# Patient Record
Sex: Female | Born: 1965 | Race: White | Hispanic: No | Marital: Single | Smoking: Current every day smoker
Health system: Southern US, Community
[De-identification: ages and names within clinical notes are randomized; demographics above are authoritative.]

## PROBLEM LIST (undated history)

## (undated) DIAGNOSIS — F329 Major depressive disorder, single episode, unspecified: Secondary | ICD-10-CM

## (undated) DIAGNOSIS — M1990 Unspecified osteoarthritis, unspecified site: Secondary | ICD-10-CM

## (undated) DIAGNOSIS — G629 Polyneuropathy, unspecified: Secondary | ICD-10-CM

## (undated) DIAGNOSIS — E7800 Pure hypercholesterolemia, unspecified: Secondary | ICD-10-CM

## (undated) DIAGNOSIS — J449 Chronic obstructive pulmonary disease, unspecified: Secondary | ICD-10-CM

## (undated) DIAGNOSIS — D631 Anemia in chronic kidney disease: Secondary | ICD-10-CM

## (undated) DIAGNOSIS — R51 Headache: Secondary | ICD-10-CM

## (undated) DIAGNOSIS — J42 Unspecified chronic bronchitis: Secondary | ICD-10-CM

## (undated) DIAGNOSIS — E039 Hypothyroidism, unspecified: Secondary | ICD-10-CM

## (undated) DIAGNOSIS — L719 Rosacea, unspecified: Secondary | ICD-10-CM

## (undated) DIAGNOSIS — C01 Malignant neoplasm of base of tongue: Secondary | ICD-10-CM

## (undated) DIAGNOSIS — F32A Depression, unspecified: Secondary | ICD-10-CM

## (undated) DIAGNOSIS — R011 Cardiac murmur, unspecified: Secondary | ICD-10-CM

## (undated) DIAGNOSIS — R519 Headache, unspecified: Secondary | ICD-10-CM

## (undated) DIAGNOSIS — D649 Anemia, unspecified: Secondary | ICD-10-CM

## (undated) DIAGNOSIS — K219 Gastro-esophageal reflux disease without esophagitis: Secondary | ICD-10-CM

## (undated) DIAGNOSIS — M797 Fibromyalgia: Secondary | ICD-10-CM

## (undated) DIAGNOSIS — R569 Unspecified convulsions: Secondary | ICD-10-CM

## (undated) DIAGNOSIS — N183 Chronic kidney disease, stage 3 (moderate): Principal | ICD-10-CM

## (undated) DIAGNOSIS — J189 Pneumonia, unspecified organism: Secondary | ICD-10-CM

## (undated) DIAGNOSIS — F419 Anxiety disorder, unspecified: Secondary | ICD-10-CM

## (undated) DIAGNOSIS — Z973 Presence of spectacles and contact lenses: Secondary | ICD-10-CM

## (undated) DIAGNOSIS — J45909 Unspecified asthma, uncomplicated: Secondary | ICD-10-CM

## (undated) HISTORY — DX: Chronic kidney disease, stage 3 (moderate): N18.3

## (undated) HISTORY — DX: Malignant neoplasm of base of tongue: C01

## (undated) HISTORY — DX: Anemia in chronic kidney disease: D63.1

## (undated) HISTORY — PX: TONSILLECTOMY: SUR1361

## (undated) HISTORY — PX: PORTACATH PLACEMENT: SHX2246

## (undated) HISTORY — PX: WISDOM TOOTH EXTRACTION: SHX21

## (undated) HISTORY — PX: GASTROSTOMY TUBE PLACEMENT: SHX655

---

## 2014-06-26 DIAGNOSIS — C01 Malignant neoplasm of base of tongue: Secondary | ICD-10-CM

## 2014-06-26 HISTORY — DX: Malignant neoplasm of base of tongue: C01

## 2014-08-08 ENCOUNTER — Emergency Department (HOSPITAL_COMMUNITY)
Admission: EM | Admit: 2014-08-08 | Discharge: 2014-08-08 | Disposition: A | Discharge status: Home / Self Care | Payer: 59 | Attending: Emergency Medicine | Admitting: Emergency Medicine

## 2014-08-08 ENCOUNTER — Encounter (HOSPITAL_COMMUNITY): Payer: Self-pay

## 2014-08-08 ENCOUNTER — Emergency Department (HOSPITAL_COMMUNITY): Payer: 59

## 2014-08-08 DIAGNOSIS — R042 Hemoptysis: Secondary | ICD-10-CM | POA: Diagnosis present

## 2014-08-08 DIAGNOSIS — Z720 Tobacco use: Secondary | ICD-10-CM | POA: Diagnosis not present

## 2014-08-08 DIAGNOSIS — Z880 Allergy status to penicillin: Secondary | ICD-10-CM | POA: Insufficient documentation

## 2014-08-08 DIAGNOSIS — Z91040 Latex allergy status: Secondary | ICD-10-CM | POA: Insufficient documentation

## 2014-08-08 DIAGNOSIS — Z7951 Long term (current) use of inhaled steroids: Secondary | ICD-10-CM | POA: Insufficient documentation

## 2014-08-08 DIAGNOSIS — C801 Malignant (primary) neoplasm, unspecified: Secondary | ICD-10-CM

## 2014-08-08 DIAGNOSIS — C969 Malignant neoplasm of lymphoid, hematopoietic and related tissue, unspecified: Secondary | ICD-10-CM | POA: Insufficient documentation

## 2014-08-08 DIAGNOSIS — G40909 Epilepsy, unspecified, not intractable, without status epilepticus: Secondary | ICD-10-CM | POA: Insufficient documentation

## 2014-08-08 DIAGNOSIS — R000 Tachycardia, unspecified: Secondary | ICD-10-CM | POA: Insufficient documentation

## 2014-08-08 DIAGNOSIS — Z79899 Other long term (current) drug therapy: Secondary | ICD-10-CM | POA: Diagnosis not present

## 2014-08-08 DIAGNOSIS — R51 Headache: Secondary | ICD-10-CM | POA: Insufficient documentation

## 2014-08-08 DIAGNOSIS — J029 Acute pharyngitis, unspecified: Secondary | ICD-10-CM | POA: Insufficient documentation

## 2014-08-08 HISTORY — DX: Unspecified convulsions: R56.9

## 2014-08-08 LAB — CBC WITH DIFFERENTIAL/PLATELET
BASOS ABS: 0 10*3/uL (ref 0.0–0.1)
BASOS PCT: 0 % (ref 0–1)
EOS ABS: 0.3 10*3/uL (ref 0.0–0.7)
Eosinophils Relative: 3 % (ref 0–5)
HCT: 31.1 % — ABNORMAL LOW (ref 36.0–46.0)
Hemoglobin: 10.1 g/dL — ABNORMAL LOW (ref 12.0–15.0)
LYMPHS ABS: 1.6 10*3/uL (ref 0.7–4.0)
Lymphocytes Relative: 19 % (ref 12–46)
MCH: 32.8 pg (ref 26.0–34.0)
MCHC: 32.5 g/dL (ref 30.0–36.0)
MCV: 101 fL — AB (ref 78.0–100.0)
MONOS PCT: 8 % (ref 3–12)
Monocytes Absolute: 0.7 10*3/uL (ref 0.1–1.0)
NEUTROS ABS: 6.1 10*3/uL (ref 1.7–7.7)
Neutrophils Relative %: 70 % (ref 43–77)
Platelets: 243 10*3/uL (ref 150–400)
RBC: 3.08 MIL/uL — AB (ref 3.87–5.11)
RDW: 15.3 % (ref 11.5–15.5)
WBC: 8.8 10*3/uL (ref 4.0–10.5)

## 2014-08-08 LAB — BASIC METABOLIC PANEL
ANION GAP: 6 (ref 5–15)
BUN: 23 mg/dL (ref 6–23)
CALCIUM: 9.6 mg/dL (ref 8.4–10.5)
CO2: 27 mmol/L (ref 19–32)
Chloride: 105 mmol/L (ref 96–112)
Creatinine, Ser: 0.57 mg/dL (ref 0.50–1.10)
GLUCOSE: 81 mg/dL (ref 70–99)
Potassium: 4.4 mmol/L (ref 3.5–5.1)
SODIUM: 138 mmol/L (ref 135–145)

## 2014-08-08 MED ORDER — LIDOCAINE VISCOUS 2 % MT SOLN: 20.0000 mL | OROMUCOSAL | Status: DC | PRN

## 2014-08-08 MED ORDER — IOHEXOL 300 MG/ML  SOLN
75.0000 mL | Freq: Once | INTRAMUSCULAR | Status: AC | PRN
  Administered 2014-08-08: 75 mL via INTRAVENOUS

## 2014-08-08 MED ORDER — HYDROCODONE-ACETAMINOPHEN 5-325 MG PO TABS: 1.0000 | ORAL_TABLET | Freq: Four times a day (QID) | ORAL | Status: DC | PRN

## 2014-08-08 MED ORDER — SODIUM CHLORIDE 0.9 % IJ SOLN
INTRAMUSCULAR | Status: AC
  Filled 2014-08-08: qty 45

## 2014-08-08 MED ORDER — SODIUM CHLORIDE 0.9 % IJ SOLN
INTRAMUSCULAR | Status: AC
  Filled 2014-08-08: qty 600

## 2014-08-08 NOTE — ED Notes (Signed)
Pt states she has been spitting up bright blood for over a month. States she has been to her PCP and was told she needed a CT but they never scheduled it.

## 2014-08-08 NOTE — ED Notes (Signed)
MD at bedside. 

## 2014-08-08 NOTE — ED Provider Notes (Signed)
CSN: [id number]     Arrival date & time 08/08/14  0847 History   First MD Initiated Contact with Patient 08/08/14 [phone]     Chief Complaint  Patient presents with  . Hemoptysis     (Consider location/radiation/quality/duration/timing/severity/associated sxs/prior Treatment) The history is provided by the patient.   Shelby Larson is a 49 y.o. female who presents to the ED with "spitting up blood". She reports that the symptoms started just after Christmas. She has been to her doctor several times. She has been treated with 2 round of antibiotics and steroids. She had a CXR in the office and the doctor told her it did not show anything but that she needed a CT of her chest. Patient states that she has a knot on the left side of her neck that started as a pea size node and has progressed. Now she is having change in her voice and pain to the side of her neck, face and head. She states she does not have difficulty swallowing but she can tell a difference when she swallows. She tried to call her doctor several times yesterday request that the CT scan be done but did not hear back from the office. The patient's sister, who is an Therapist, sports,  came into town today and brought the patient to the ED.   Past Medical History  Diagnosis Date  . Seizures    History reviewed. No pertinent past surgical history. No family history on file. History  Substance Use Topics  . Smoking status: Current Every Day Smoker  . Smokeless tobacco: Not on file  . Alcohol Use: No   OB History    No data available     Review of Systems  HENT: Positive for sore throat and voice change. Negative for drooling and trouble swallowing.   Respiratory: Positive for cough.        Hemoptysis   Neurological: Positive for headaches.  Hematological: Positive for adenopathy.  all other systems negative    Allergies  Penicillins and Latex  Home Medications   Prior to Admission medications   Medication Sig Start Date End  Date Taking? Authorizing Provider  Acetaminophen (CHLORASEPTIC SORE THROAT PO) Take 2 sprays by mouth daily as needed (sore throat).   Yes Historical Provider, MD  cyclobenzaprine (FLEXERIL) 10 MG tablet Take 10 mg by mouth 3 (three) times daily.   Yes Historical Provider, MD  fluticasone (FLONASE) 50 MCG/ACT nasal spray Place 2 sprays into both nostrils daily.   Yes Historical Provider, MD  gabapentin (NEURONTIN) 600 MG tablet Take 600 mg by mouth 3 (three) times daily.   Yes Historical Provider, MD  MAGNESIUM PO Take 2 tablets by mouth daily.   Yes Historical Provider, MD  Multiple Vitamin (MULTIVITAMIN WITH MINERALS) TABS tablet Take 1 tablet by mouth daily.   Yes Historical Provider, MD  naproxen sodium (ANAPROX) 220 MG tablet Take 440 mg by mouth daily as needed (pain).   Yes Historical Provider, MD  QUEtiapine (SEROQUEL) 50 MG tablet Take 50 mg by mouth daily as needed (sleep).   Yes Historical Provider, MD  tetrahydrozoline-zinc (VISINE-AC) 0.05-0.25 % ophthalmic solution Place 2 drops into both eyes daily as needed (dry eyes).   Yes Historical Provider, MD  HYDROcodone-acetaminophen (NORCO/VICODIN) 5-325 MG per tablet Take 1 tablet by mouth every 6 (six) hours as needed for severe pain. 08/08/14   Carmin Muskrat, MD  lidocaine (XYLOCAINE) 2 % solution Use as directed 20 mLs in the mouth or throat as  needed for mouth pain. 08/08/14   Carmin Muskrat, MD   BP 114/75 mmHg  Pulse 115  Temp(Src) 97.8 F (36.6 C) (Oral)  Resp 18  Ht 5\' 6"  (1.676 m)  Wt 121 lb (54.885 kg)  BMI 19.54 kg/m2  SpO2 100% Physical Exam  Constitutional: She is oriented to person, place, and time. She appears well-developed and well-nourished.  HENT:  Head: Normocephalic.  Mouth/Throat: Uvula is midline and mucous membranes are normal. Posterior oropharyngeal erythema present.  Eyes: Conjunctivae and EOM are normal.  Neck: Neck supple.  Enlarged lymph node left side of neck. Smaller node right side.    Cardiovascular: Tachycardia present.   Pulmonary/Chest: Effort normal. She has no wheezes. She has no rales.  Musculoskeletal: Normal range of motion.  Lymphadenopathy:    She has cervical adenopathy.  Neurological: She is alert and oriented to person, place, and time. No cranial nerve deficit.  Skin: Skin is warm and dry.  Psychiatric: She has a normal mood and affect. Her behavior is normal.  Nursing note and vitals reviewed.   ED Course  Procedures  MDM  49 y.o. female with swollen lymph node, change in voice and pain to the left side of her neck and head that has been progressing since December 2015. Stable for d/c to follow up with ENT. Dr. Vanita Panda in to discuss in detail results of the CT scan and plan of care with the patient and her family. They voice understanding and agree with plan. Patient will return as need for any problems.   Results for orders placed or performed during the hospital encounter of 08/08/14 (from the past 24 hour(s))  Basic metabolic panel     Status: None   Collection Time: 08/08/14  9:59 AM  Result Value Ref Range   Sodium 138 135 - 145 mmol/L   Potassium 4.4 3.5 - 5.1 mmol/L   Chloride 105 96 - 112 mmol/L   CO2 27 19 - 32 mmol/L   Glucose, Bld 81 70 - 99 mg/dL   BUN 23 6 - 23 mg/dL   Creatinine, Ser 0.57 0.50 - 1.10 mg/dL   Calcium 9.6 8.4 - 10.5 mg/dL   GFR calc non Af Amer >90 >90 mL/min   GFR calc Af Amer >90 >90 mL/min   Anion gap 6 5 - 15  CBC with Differential     Status: Abnormal   Collection Time: 08/08/14  9:59 AM  Result Value Ref Range   WBC 8.8 4.0 - 10.5 K/uL   RBC 3.08 (L) 3.87 - 5.11 MIL/uL   Hemoglobin 10.1 (L) 12.0 - 15.0 g/dL   HCT 31.1 (L) 36.0 - 46.0 %   MCV 101.0 (H) 78.0 - 100.0 fL   MCH 32.8 26.0 - 34.0 pg   MCHC 32.5 30.0 - 36.0 g/dL   RDW 15.3 11.5 - 15.5 %   Platelets 243 150 - 400 K/uL   Neutrophils Relative % 70 43 - 77 %   Neutro Abs 6.1 1.7 - 7.7 K/uL   Lymphocytes Relative 19 12 - 46 %   Lymphs Abs 1.6  0.7 - 4.0 K/uL   Monocytes Relative 8 3 - 12 %   Monocytes Absolute 0.7 0.1 - 1.0 K/uL   Eosinophils Relative 3 0 - 5 %   Eosinophils Absolute 0.3 0.0 - 0.7 K/uL   Basophils Relative 0 0 - 1 %   Basophils Absolute 0.0 0.0 - 0.1 K/uL    Ct Soft Tissue Neck W Contrast  08/08/2014   CLINICAL DATA:  Hemoptysis.  EXAM: CT NECK WITH CONTRAST  CHEST CT WITH CONTRAST  TECHNIQUE: Multidetector CT imaging of the neck and chest was performed using the standard protocol following the bolus administration of intravenous contrast.  CONTRAST:  34mL OMNIPAQUE IOHEXOL 300 MG/ML  SOLN  COMPARISON:  None.  FINDINGS: Pharynx and larynx: 3.2 cm mass in the left oropharynx, centered to the left with broad contact with the left posterior tongue and glossotonsillar sulcus. Probable early extension into the pre epiglottic fat. The margin with the lingual epiglottis is indistinct, but this could be displacement rather than invasion. No glottis or infraglottic tumor. No evidence of cord paralysis.  Adenopathy in the left cervical chain, levels IIA/B, III, and IV. The largest left node is in the jugulodigastric measuring 23 mm in maximal diameter. The right jugulodigastric node is mildly enlarged but appears heterogeneous in enhancement. There is mild but asymmetric enlargement of the lateral left retropharyngeal lymph node, without necrosis.  Salivary glands: Negative  Thyroid: Negative  Vascular: Cervical carotid atherosclerosis without hemodynamically significant stenosis.  Limited intracranial: Negative  Mastoids and visualized paranasal sinuses: Negative  Skeleton: No evidence of metastasis  THORACIC INLET/BODY WALL:  No acute abnormality.  MEDIASTINUM:  Normal heart size. No pericardial effusion. No acute vascular abnormality. No adenopathy.  LUNG WINDOWS:  Suspect mild emphysema. No consolidation. No effusion. No suspicious pulmonary nodule.  UPPER ABDOMEN:  No evidence of metastatic disease  OSSEOUS:  No acute fracture.  No  suspicious lytic or blastic lesions.  IMPRESSION: 1. 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 The mass causes moderate to advanced airway narrowing. For staging, the tumor involves the tongue base, left glossotonsillar sulcus, lingual epiglottis and likely preepiglottic fat. 2. Bilateral cervical adenopathy, levels noted above. A lateral left retropharyngeal lymph node is also suspicious due to asymmetry. 3. No evidence of thoracic metastatic disease.   Electronically Signed   By: Monte Fantasia M.D.   On: 08/08/2014 12:42   Ct Chest W Contrast  08/08/2014   CLINICAL DATA:  Hemoptysis.  EXAM: CT NECK WITH CONTRAST  CHEST CT WITH CONTRAST  TECHNIQUE: Multidetector CT imaging of the neck and chest was performed using the standard protocol following the bolus administration of intravenous contrast.  CONTRAST:  7mL OMNIPAQUE IOHEXOL 300 MG/ML  SOLN  COMPARISON:  None.  FINDINGS: Pharynx and larynx: 3.2 cm mass in the left oropharynx, centered to the left with broad contact with the left posterior tongue and glossotonsillar sulcus. Probable early extension into the pre epiglottic fat. The margin with the lingual epiglottis is indistinct, but this could be displacement rather than invasion. No glottis or infraglottic tumor. No evidence of cord paralysis.  Adenopathy in the left cervical chain, levels IIA/B, III, and IV. The largest left node is in the jugulodigastric measuring 23 mm in maximal diameter. The right jugulodigastric node is mildly enlarged but appears heterogeneous in enhancement. There is mild but asymmetric enlargement of the lateral left retropharyngeal lymph node, without necrosis.  Salivary glands: Negative  Thyroid: Negative  Vascular: Cervical carotid atherosclerosis without hemodynamically significant stenosis.  Limited intracranial: Negative  Mastoids and visualized paranasal sinuses: Negative  Skeleton: No evidence of metastasis  THORACIC INLET/BODY WALL:  No acute  abnormality.  MEDIASTINUM:  Normal heart size. No pericardial effusion. No acute vascular abnormality. No adenopathy.  LUNG WINDOWS:  Suspect mild emphysema. No consolidation. No effusion. No suspicious pulmonary nodule.  UPPER ABDOMEN:  No evidence of metastatic disease  OSSEOUS:  No acute fracture.  No suspicious lytic or blastic lesions.  IMPRESSION: 1. 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 The mass causes moderate to advanced airway narrowing. For staging, the tumor involves the tongue base, left glossotonsillar sulcus, lingual epiglottis and likely preepiglottic fat. 2. Bilateral cervical adenopathy, levels noted above. A lateral left retropharyngeal lymph node is also suspicious due to asymmetry. 3. No evidence of thoracic metastatic disease.   Electronically Signed   By: Monte Fantasia M.D.   On: 08/08/2014 12:42    Final diagnoses:  Adenocarcinoma       Ashley Murrain, NP 08/08/14 [place], MD 08/08/14 [phone]

## 2014-08-08 NOTE — Discharge Instructions (Signed)
Your CT scan of your neck today shows a mass that appears to be cancer. You will need to call Dr. Janace Hoard' office on Monday and tell them that we spoke with him and he want you to come to the office on Monday. If you have problems before then such as difficulty swallowing return here.

## 2014-08-08 NOTE — ED Notes (Addendum)
IV removed per pt. Request, states it was painful. No swelling at site, flushes easily.

## 2014-08-11 ENCOUNTER — Other Ambulatory Visit: Payer: Self-pay | Admitting: Otolaryngology

## 2014-08-11 ENCOUNTER — Other Ambulatory Visit (HOSPITAL_COMMUNITY)
Admission: RE | Admit: 2014-08-11 | Discharge: 2014-08-11 | Disposition: A | Discharge status: Home / Self Care | Payer: 59 | Source: Ambulatory Visit | Attending: Otolaryngology | Admitting: Otolaryngology

## 2014-08-11 DIAGNOSIS — D490 Neoplasm of unspecified behavior of digestive system: Secondary | ICD-10-CM | POA: Diagnosis not present

## 2014-08-17 ENCOUNTER — Other Ambulatory Visit (HOSPITAL_COMMUNITY): Payer: Self-pay | Admitting: Otolaryngology

## 2014-08-18 NOTE — H&P (Addendum)
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 CT neck showed large left pharyngeal mass and multiple left neck necrotic nodes. 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 Tonsillar biopsy showed patchy p16 positivity.  She  now presents for awake tracheotomy and tonsillectomy/resection left pharyngeal mass with left neck dissection.  Dr. Simeon Craft, [place] has discussed the risks (need for awake tracheotomy, bleeding, infection, airway/anesthesia risks, death, anoxic brain injury, injury to lingual, hypoglossal, facial, spinal accessory, vagus, or phrenic nerves, wound breakdown), benefits, and alternatives of this procedure. The patient understands the risks and would like to proceed with the procedure. The chances of success of the procedure are >50% and the patient understands this. I personally performed an examination of the patient within 24 hours of the procedure.  PAST MEDICAL HISTORY: Past Medical History  Diagnosis Date  . Seizures     PAST SURGICAL HISTORY: No past surgical history on file.  MEDICATIONS: No current facility-administered medications on file prior to encounter.   Current Outpatient Prescriptions on File Prior to Encounter  Medication Sig Dispense Refill  . Acetaminophen (CHLORASEPTIC SORE THROAT PO) Take 2 sprays by mouth daily as needed (sore throat).    . cyclobenzaprine (FLEXERIL) 10 MG tablet Take 10 mg by mouth 3 (three) times daily.    . fluticasone (FLONASE) 50 MCG/ACT nasal spray Place 2 sprays into both nostrils daily.    Marland Kitchen gabapentin (NEURONTIN) 600 MG tablet Take 600 mg by mouth 3 (three) times daily.    Marland Kitchen HYDROcodone-acetaminophen (NORCO/VICODIN) 5-325 MG per tablet Take 1 tablet by mouth every 6 (six) hours as needed  for severe pain. 15 tablet 0  . lidocaine (XYLOCAINE) 2 % solution Use as directed 20 mLs in the mouth or throat as needed for mouth pain. 100 mL 0  . MAGNESIUM PO Take 2 tablets by mouth daily.    . Multiple Vitamin (MULTIVITAMIN WITH MINERALS) TABS tablet Take 1 tablet by mouth daily.    . naproxen sodium (ANAPROX) 220 MG tablet Take 440 mg by mouth daily as needed (pain).    . QUEtiapine (SEROQUEL) 50 MG tablet Take 50 mg by mouth daily as needed (sleep).    Marland Kitchen tetrahydrozoline-zinc (VISINE-AC) 0.05-0.25 % ophthalmic solution Place 2 drops into both eyes daily as needed (dry eyes).      ALLERGIES: Allergies  Allergen Reactions  . Penicillins Other (See Comments)    unknown  . Latex Swelling and Rash      SOCIAL HISTORY:  History   Social History  . Marital Status: Single    Spouse Name: N/A  . Number of Children: N/A  . Years of Education: N/A   Occupational History  . Not on file.   Social History Main Topics  . Smoking status: Current Every Day Smoker  . Smokeless tobacco: Not on file  . Alcohol Use: No  . Drug Use: No  . Sexual Activity: Not on file   Other Topics Concern  . Not on file   Social History Narrative  . No narrative on file    FAMILY HISTORY: No family history on file.  REVIEW OF SYSTEMS:  HEENT: hemoptysis/dysphagia/sore throat, left neck masses, otherwise negative x 12 systems except per HPI  PHYSICAL EXAM:  GENERAL: NAD   VITAL SIGNS:  There  were no vitals filed for this visit. SKIN:  Warm, dry HEENT:   Tonsils grossly normal on oral exam, flexible laryngoscopy performed in pre-op NECK/LYMPH:  Multiple left neck level II-IV lymph nodes ABDOMEN:  soft MUSCULOSKELETAL: normal strength PSYCH:  Normal affect NEUROLOGIC:  CN 2-12 intact and symmetric  DIAGNOSTIC STUDIES: CT neck with large left pharyngeal mass and multiple large left neck nodes, scattered small right neck nodes, CT chest with no obvious lung metastases, likely stage [id number] vs  N2cM0  31575: flexible laryngoscopy-after explaining the risks (bleeding/infection), the 13mm flexible scope was passed through the right and left nasal cavity. The septum is deviated to the left. The right nasal cavity shows some dry mucosa and crusting. The tongue base shows a large, ~ 3 cm left pharyngeal/tongue base mass with smooth walls consistent with the pharyngeal mass seen on CT scan. This abuts the left lingual surface of the epiglottis. The true vocal folds are visualized and are pushed to the patient's right but are mobile with no obvious masses and intact adduction and abduction bilaterally.   ASSESSMENT AND PLAN: flexible laryngoscopy in pre-op demonstrates large left pharyngeal mass. Safest plan would be awake tracheotomy followed by debulking or excision if possible of the left pharyngeal mass/tonsillectomy and then left neck dissection. Discussed the awake tracheotomy with the patient and she agrees with the plan. Patient understands the risks, benefits, and alternatives. Informed written consent signed, witnessed, and on chart. 08/20/14  8:06 AM Shelby Larson

## 2014-08-19 ENCOUNTER — Encounter (HOSPITAL_COMMUNITY): Payer: Self-pay | Admitting: *Deleted

## 2014-08-20 ENCOUNTER — Inpatient Hospital Stay (HOSPITAL_COMMUNITY): Payer: 59

## 2014-08-20 ENCOUNTER — Inpatient Hospital Stay (HOSPITAL_COMMUNITY)
Admission: RE | Admit: 2014-08-20 | Discharge: 2014-08-28 | DRG: 013 | Disposition: A | Discharge status: Skilled Nursing Facility | Payer: 59 | Source: Ambulatory Visit | Attending: Otolaryngology | Admitting: Otolaryngology

## 2014-08-20 ENCOUNTER — Inpatient Hospital Stay (HOSPITAL_COMMUNITY): Payer: 59 | Admitting: Certified Registered Nurse Anesthetist

## 2014-08-20 ENCOUNTER — Encounter (HOSPITAL_COMMUNITY): Payer: Self-pay | Admitting: *Deleted

## 2014-08-20 ENCOUNTER — Encounter (HOSPITAL_COMMUNITY)
Admission: RE | Disposition: A | Discharge status: Skilled Nursing Facility | Payer: Self-pay | Source: Ambulatory Visit | Attending: Otolaryngology

## 2014-08-20 DIAGNOSIS — C01 Malignant neoplasm of base of tongue: Secondary | ICD-10-CM | POA: Diagnosis present

## 2014-08-20 DIAGNOSIS — J988 Other specified respiratory disorders: Secondary | ICD-10-CM | POA: Diagnosis present

## 2014-08-20 DIAGNOSIS — C109 Malignant neoplasm of oropharynx, unspecified: Secondary | ICD-10-CM

## 2014-08-20 DIAGNOSIS — Z930 Tracheostomy status: Secondary | ICD-10-CM

## 2014-08-20 DIAGNOSIS — R221 Localized swelling, mass and lump, neck: Secondary | ICD-10-CM | POA: Diagnosis present

## 2014-08-20 DIAGNOSIS — Z430 Encounter for attention to tracheostomy: Secondary | ICD-10-CM

## 2014-08-20 DIAGNOSIS — F17210 Nicotine dependence, cigarettes, uncomplicated: Secondary | ICD-10-CM | POA: Diagnosis present

## 2014-08-20 DIAGNOSIS — R1310 Dysphagia, unspecified: Secondary | ICD-10-CM

## 2014-08-20 DIAGNOSIS — Z0189 Encounter for other specified special examinations: Secondary | ICD-10-CM

## 2014-08-20 DIAGNOSIS — C140 Malignant neoplasm of pharynx, unspecified: Principal | ICD-10-CM | POA: Diagnosis present

## 2014-08-20 DIAGNOSIS — Z4659 Encounter for fitting and adjustment of other gastrointestinal appliance and device: Secondary | ICD-10-CM

## 2014-08-20 DIAGNOSIS — R1313 Dysphagia, pharyngeal phase: Secondary | ICD-10-CM | POA: Diagnosis not present

## 2014-08-20 HISTORY — DX: Cardiac murmur, unspecified: R01.1

## 2014-08-20 HISTORY — DX: Depression, unspecified: F32.A

## 2014-08-20 HISTORY — DX: Major depressive disorder, single episode, unspecified: F32.9

## 2014-08-20 HISTORY — DX: Anxiety disorder, unspecified: F41.9

## 2014-08-20 HISTORY — DX: Fibromyalgia: M79.7

## 2014-08-20 HISTORY — PX: RADICAL NECK DISSECTION: SHX2284

## 2014-08-20 HISTORY — DX: Unspecified osteoarthritis, unspecified site: M19.90

## 2014-08-20 HISTORY — DX: Polyneuropathy, unspecified: G62.9

## 2014-08-20 HISTORY — DX: Anemia, unspecified: D64.9

## 2014-08-20 HISTORY — PX: TONSILLECTOMY: SHX5217

## 2014-08-20 HISTORY — DX: Unspecified chronic bronchitis: J42

## 2014-08-20 HISTORY — DX: Headache: R51

## 2014-08-20 HISTORY — DX: Rosacea, unspecified: L71.9

## 2014-08-20 HISTORY — PX: TRACHEOSTOMY TUBE PLACEMENT: SHX814

## 2014-08-20 HISTORY — DX: Headache, unspecified: R51.9

## 2014-08-20 LAB — COMPREHENSIVE METABOLIC PANEL
ALT: 14 U/L (ref 0–35)
AST: 31 U/L (ref 0–37)
Albumin: 3.4 g/dL — ABNORMAL LOW (ref 3.5–5.2)
Alkaline Phosphatase: 76 U/L (ref 39–117)
Anion gap: 17 — ABNORMAL HIGH (ref 5–15)
BUN: 9 mg/dL (ref 6–23)
CALCIUM: 8.5 mg/dL (ref 8.4–10.5)
CO2: 19 mmol/L (ref 19–32)
Chloride: 101 mmol/L (ref 96–112)
Creatinine, Ser: 0.84 mg/dL (ref 0.50–1.10)
GFR calc Af Amer: >90 mL/min (ref >90)
GFR, EST NON AFRICAN AMERICAN: 81 mL/min — AB (ref >90)
Glucose, Bld: 72 mg/dL (ref 70–99)
Potassium: 4.3 mmol/L (ref 3.5–5.1)
SODIUM: 137 mmol/L (ref 135–145)
TOTAL PROTEIN: 6.3 g/dL (ref 6.0–8.3)
Total Bilirubin: 1.8 mg/dL — ABNORMAL HIGH (ref 0.3–1.2)

## 2014-08-20 LAB — ABO/RH: ABO/RH(D): A POS

## 2014-08-20 LAB — BASIC METABOLIC PANEL
Anion gap: 12 (ref 5–15)
BUN: 10 mg/dL (ref 6–23)
CO2: 25 mmol/L (ref 19–32)
Calcium: 9.5 mg/dL (ref 8.4–10.5)
Chloride: 98 mmol/L (ref 96–112)
Creatinine, Ser: 0.86 mg/dL (ref 0.50–1.10)
GFR, EST NON AFRICAN AMERICAN: 79 mL/min — AB (ref >90)
Glucose, Bld: 90 mg/dL (ref 70–99)
POTASSIUM: 3.1 mmol/L — AB (ref 3.5–5.1)
Sodium: 135 mmol/L (ref 135–145)

## 2014-08-20 LAB — CBC
HCT: 26 % — ABNORMAL LOW (ref 36.0–46.0)
HCT: 26.8 % — ABNORMAL LOW (ref 36.0–46.0)
Hemoglobin: 8.2 g/dL — ABNORMAL LOW (ref 12.0–15.0)
Hemoglobin: 8.8 g/dL — ABNORMAL LOW (ref 12.0–15.0)
MCH: 30.9 pg (ref 26.0–34.0)
MCH: 30.7 pg (ref 26.0–34.0)
MCHC: 31.5 g/dL (ref 30.0–36.0)
MCHC: 32.8 g/dL (ref 30.0–36.0)
MCV: 98.1 fL (ref 78.0–100.0)
MCV: 93.4 fL (ref 78.0–100.0)
Platelets: 358 10*3/uL (ref 150–400)
Platelets: 228 10*3/uL (ref 150–400)
RBC: 2.65 MIL/uL — ABNORMAL LOW (ref 3.87–5.11)
RBC: 2.87 MIL/uL — ABNORMAL LOW (ref 3.87–5.11)
RDW: 15.4 % (ref 11.5–15.5)
RDW: 15.7 % — AB (ref 11.5–15.5)
WBC: 11.9 10*3/uL — ABNORMAL HIGH (ref 4.0–10.5)
WBC: 8.1 10*3/uL (ref 4.0–10.5)

## 2014-08-20 LAB — PREPARE RBC (CROSSMATCH)

## 2014-08-20 LAB — PHOSPHORUS: Phosphorus: 4.3 mg/dL (ref 2.3–4.6)

## 2014-08-20 LAB — MAGNESIUM: Magnesium: 1.3 mg/dL — ABNORMAL LOW (ref 1.5–2.5)

## 2014-08-20 LAB — APTT: APTT: 28 s (ref 24–37)

## 2014-08-20 LAB — PROTIME-INR
INR: 1.11 (ref 0.00–1.49)
Prothrombin Time: 14.4 seconds (ref 11.6–15.2)

## 2014-08-20 SURGERY — TONSILLECTOMY
Anesthesia: General | Site: Throat

## 2014-08-20 MED ORDER — FENTANYL CITRATE 0.05 MG/ML IJ SOLN
INTRAMUSCULAR | Status: AC
  Filled 2014-08-20: qty 5

## 2014-08-20 MED ORDER — LIDOCAINE-EPINEPHRINE 1 %-1:100000 IJ SOLN
INTRAMUSCULAR | Status: AC
  Filled 2014-08-20: qty 1

## 2014-08-20 MED ORDER — QUETIAPINE FUMARATE 50 MG PO TABS
50.0000 mg | ORAL_TABLET | Freq: Every day | ORAL | Status: DC | PRN
  Administered 2014-08-20 – 2014-08-24 (×2): 50 mg via ORAL
  Filled 2014-08-20 (×4): qty 1

## 2014-08-20 MED ORDER — PROPOFOL 10 MG/ML IV BOLUS
INTRAVENOUS | Status: AC
  Filled 2014-08-20: qty 20

## 2014-08-20 MED ORDER — NICOTINE 14 MG/24HR TD PT24
14.0000 mg | MEDICATED_PATCH | Freq: Every day | TRANSDERMAL | Status: DC | PRN
  Filled 2014-08-20: qty 1

## 2014-08-20 MED ORDER — PNEUMOCOCCAL VAC POLYVALENT 25 MCG/0.5ML IJ INJ
0.5000 mL | INJECTION | INTRAMUSCULAR | Status: DC
  Filled 2014-08-20: qty 0.5

## 2014-08-20 MED ORDER — CLINDAMYCIN PHOSPHATE 600 MG/50ML IV SOLN
600.0000 mg | Freq: Once | INTRAVENOUS | Status: AC
  Administered 2014-08-20: 600 mg via INTRAVENOUS
  Filled 2014-08-20: qty 50

## 2014-08-20 MED ORDER — SODIUM CHLORIDE 0.9 % IV SOLN: Freq: Once | INTRAVENOUS | Status: DC

## 2014-08-20 MED ORDER — HYDROMORPHONE HCL 1 MG/ML IJ SOLN
0.2500 mg | INTRAMUSCULAR | Status: DC | PRN
  Administered 2014-08-20: 0.25 mg via INTRAVENOUS
  Administered 2014-08-20: 0.75 mg via INTRAVENOUS

## 2014-08-20 MED ORDER — CYCLOBENZAPRINE HCL 10 MG PO TABS
10.0000 mg | ORAL_TABLET | Freq: Three times a day (TID) | ORAL | Status: DC
  Administered 2014-08-20 – 2014-08-28 (×22): 10 mg via ORAL
  Filled 2014-08-20 (×24): qty 1

## 2014-08-20 MED ORDER — LIDOCAINE-EPINEPHRINE 1 %-1:100000 IJ SOLN
INTRAMUSCULAR | Status: DC | PRN
  Administered 2014-08-20: 40 mL

## 2014-08-20 MED ORDER — DIPHENHYDRAMINE HCL 50 MG/ML IJ SOLN: 12.5000 mg | Freq: Four times a day (QID) | INTRAMUSCULAR | Status: DC | PRN

## 2014-08-20 MED ORDER — DOCUSATE SODIUM 50 MG/5ML PO LIQD
100.0000 mg | Freq: Two times a day (BID) | ORAL | Status: DC | PRN
  Filled 2014-08-20: qty 10

## 2014-08-20 MED ORDER — MIDAZOLAM HCL 5 MG/5ML IJ SOLN
INTRAMUSCULAR | Status: DC | PRN
  Administered 2014-08-20 (×2): 1 mg via INTRAVENOUS

## 2014-08-20 MED ORDER — SERTRALINE HCL 100 MG PO TABS
100.0000 mg | ORAL_TABLET | Freq: Every day | ORAL | Status: DC
  Administered 2014-08-21 – 2014-08-28 (×8): 100 mg via ORAL
  Filled 2014-08-20 (×8): qty 1

## 2014-08-20 MED ORDER — MAGNESIUM GLUCONATE 500 MG PO TABS
500.0000 mg | ORAL_TABLET | Freq: Every day | ORAL | Status: DC
  Administered 2014-08-20 – 2014-08-28 (×9): 500 mg via ORAL
  Filled 2014-08-20 (×10): qty 1

## 2014-08-20 MED ORDER — MIDAZOLAM HCL 2 MG/2ML IJ SOLN
INTRAMUSCULAR | Status: AC
  Filled 2014-08-20: qty 2

## 2014-08-20 MED ORDER — BACITRACIN ZINC 500 UNIT/GM EX OINT
TOPICAL_OINTMENT | CUTANEOUS | Status: AC
  Filled 2014-08-20: qty 28.35

## 2014-08-20 MED ORDER — PROPOFOL 10 MG/ML IV BOLUS
INTRAVENOUS | Status: DC | PRN
  Administered 2014-08-20: 10 mg via INTRAVENOUS
  Administered 2014-08-20: 100 mg via INTRAVENOUS
  Administered 2014-08-20 (×3): 10 mg via INTRAVENOUS
  Administered 2014-08-20: 20 mg via INTRAVENOUS
  Administered 2014-08-20: 40 mg via INTRAVENOUS

## 2014-08-20 MED ORDER — HEMOSTATIC AGENTS (NO CHARGE) OPTIME
TOPICAL | Status: DC | PRN
  Administered 2014-08-20: 1 via TOPICAL

## 2014-08-20 MED ORDER — PHENYLEPHRINE HCL 10 MG/ML IJ SOLN
INTRAMUSCULAR | Status: DC | PRN
  Administered 2014-08-20 (×3): 80 ug via INTRAVENOUS

## 2014-08-20 MED ORDER — DIPHENHYDRAMINE HCL 12.5 MG/5ML PO ELIX
12.5000 mg | ORAL_SOLUTION | Freq: Four times a day (QID) | ORAL | Status: DC | PRN
  Filled 2014-08-20: qty 10

## 2014-08-20 MED ORDER — HYDROMORPHONE HCL 1 MG/ML IJ SOLN
INTRAMUSCULAR | Status: AC
  Administered 2014-08-20: 0.25 mg via INTRAVENOUS
  Filled 2014-08-20: qty 1

## 2014-08-20 MED ORDER — FENTANYL CITRATE 0.05 MG/ML IJ SOLN
INTRAMUSCULAR | Status: DC | PRN
  Administered 2014-08-20 (×2): 50 ug via INTRAVENOUS
  Administered 2014-08-20: 25 ug via INTRAVENOUS
  Administered 2014-08-20 (×2): 50 ug via INTRAVENOUS
  Administered 2014-08-20: 25 ug via INTRAVENOUS

## 2014-08-20 MED ORDER — PANTOPRAZOLE SODIUM 40 MG IV SOLR
40.0000 mg | Freq: Every day | INTRAVENOUS | Status: DC
  Administered 2014-08-20 – 2014-08-27 (×8): 40 mg via INTRAVENOUS
  Filled 2014-08-20 (×8): qty 40

## 2014-08-20 MED ORDER — CHLORDIAZEPOXIDE HCL 5 MG PO CAPS: 5.0000 mg | ORAL_CAPSULE | Freq: Three times a day (TID) | ORAL | Status: DC | PRN

## 2014-08-20 MED ORDER — ONDANSETRON HCL 4 MG/2ML IJ SOLN
INTRAMUSCULAR | Status: AC
  Filled 2014-08-20: qty 2

## 2014-08-20 MED ORDER — ACETAMINOPHEN 160 MG/5ML PO SOLN: 325.0000 mg | ORAL | Status: DC | PRN

## 2014-08-20 MED ORDER — METHOCARBAMOL 500 MG PO TABS
500.0000 mg | ORAL_TABLET | Freq: Three times a day (TID) | ORAL | Status: DC | PRN
  Filled 2014-08-20: qty 1

## 2014-08-20 MED ORDER — ROCURONIUM BROMIDE 50 MG/5ML IV SOLN
INTRAVENOUS | Status: AC
  Filled 2014-08-20: qty 1

## 2014-08-20 MED ORDER — HYDROMORPHONE HCL 1 MG/ML IJ SOLN
1.0000 mg | INTRAMUSCULAR | Status: DC | PRN
  Administered 2014-08-20 – 2014-08-28 (×14): 1 mg via INTRAVENOUS
  Filled 2014-08-20 (×14): qty 1

## 2014-08-20 MED ORDER — ONDANSETRON HCL 4 MG/2ML IJ SOLN
INTRAMUSCULAR | Status: DC | PRN
  Administered 2014-08-20: 4 mg via INTRAVENOUS

## 2014-08-20 MED ORDER — NAPHAZOLINE HCL 0.1 % OP SOLN
2.0000 [drp] | Freq: Four times a day (QID) | OPHTHALMIC | Status: DC | PRN
  Filled 2014-08-20: qty 15

## 2014-08-20 MED ORDER — PHENYLEPHRINE HCL 10 MG/ML IJ SOLN
10.0000 mg | INTRAVENOUS | Status: DC | PRN
  Administered 2014-08-20: 10 ug/min via INTRAVENOUS

## 2014-08-20 MED ORDER — 0.9 % SODIUM CHLORIDE (POUR BTL) OPTIME
TOPICAL | Status: DC | PRN
  Administered 2014-08-20: 1000 mL

## 2014-08-20 MED ORDER — ARTIFICIAL TEARS OP OINT
TOPICAL_OINTMENT | OPHTHALMIC | Status: DC | PRN
  Administered 2014-08-20: 1 via OPHTHALMIC

## 2014-08-20 MED ORDER — OXYMETAZOLINE HCL 0.05 % NA SOLN
NASAL | Status: DC | PRN
  Administered 2014-08-20: 1

## 2014-08-20 MED ORDER — OXYMETAZOLINE HCL 0.05 % NA SOLN
3.0000 | Freq: Once | NASAL | Status: AC
  Administered 2014-08-20: 3 via NASAL
  Filled 2014-08-20 (×2): qty 15

## 2014-08-20 MED ORDER — PROMETHAZINE HCL 25 MG/ML IJ SOLN
12.5000 mg | Freq: Once | INTRAMUSCULAR | Status: DC
  Filled 2014-08-20: qty 1

## 2014-08-20 MED ORDER — KCL IN DEXTROSE-NACL 10-5-0.45 MEQ/L-%-% IV SOLN
INTRAVENOUS | Status: DC
  Administered 2014-08-20: 23:00:00 via INTRAVENOUS
  Filled 2014-08-20 (×7): qty 1000

## 2014-08-20 MED ORDER — LACTATED RINGERS IV SOLN
INTRAVENOUS | Status: DC | PRN
  Administered 2014-08-20: 11:00:00 via INTRAVENOUS

## 2014-08-20 MED ORDER — ONDANSETRON HCL 4 MG/2ML IJ SOLN: 4.0000 mg | Freq: Four times a day (QID) | INTRAMUSCULAR | Status: DC | PRN

## 2014-08-20 MED ORDER — LACTATED RINGERS IV SOLN
INTRAVENOUS | Status: DC | PRN
  Administered 2014-08-20 (×2): via INTRAVENOUS

## 2014-08-20 MED ORDER — OXYMETAZOLINE HCL 0.05 % NA SOLN
NASAL | Status: AC
  Filled 2014-08-20: qty 15

## 2014-08-20 MED ORDER — GABAPENTIN 600 MG PO TABS
600.0000 mg | ORAL_TABLET | Freq: Three times a day (TID) | ORAL | Status: DC
  Administered 2014-08-20 – 2014-08-28 (×21): 600 mg via ORAL
  Filled 2014-08-20 (×24): qty 1

## 2014-08-20 MED ORDER — LIDOCAINE HCL (CARDIAC) 20 MG/ML IV SOLN
INTRAVENOUS | Status: AC
  Filled 2014-08-20: qty 5

## 2014-08-20 MED ORDER — LACTATED RINGERS IV SOLN
INTRAVENOUS | Status: DC
Start: 2014-08-20 — End: 2014-08-28
  Administered 2014-08-20: 08:00:00 via INTRAVENOUS

## 2014-08-20 MED ORDER — ONDANSETRON HCL 4 MG/2ML IJ SOLN
4.0000 mg | Freq: Once | INTRAMUSCULAR | Status: AC
  Administered 2014-08-20: 4 mg via INTRAVENOUS

## 2014-08-20 MED ORDER — CIPROFLOXACIN IN D5W 400 MG/200ML IV SOLN
400.0000 mg | Freq: Two times a day (BID) | INTRAVENOUS | Status: AC
  Administered 2014-08-21 – 2014-08-22 (×3): 400 mg via INTRAVENOUS
  Filled 2014-08-20 (×4): qty 200

## 2014-08-20 MED ORDER — ACETAMINOPHEN-CODEINE 120-12 MG/5ML PO SOLN
10.0000 mL | ORAL | Status: DC | PRN
  Administered 2014-08-20 – 2014-08-28 (×6): 10 mL via ORAL
  Filled 2014-08-20 (×6): qty 10

## 2014-08-20 SURGICAL SUPPLY — 76 items
BLADE SURG 15 STRL LF DISP TIS (BLADE) ×4 IMPLANT
BLADE SURG 15 STRL SS (BLADE) ×2
CANISTER SUCTION 2500CC (MISCELLANEOUS) ×6 IMPLANT
CLEANER TIP ELECTROSURG 2X2 (MISCELLANEOUS) ×12 IMPLANT
COAGULATOR SUCT 6 FR SWTCH (ELECTROSURGICAL) ×1
COAGULATOR SUCT SWTCH 10FR 6 (ELECTROSURGICAL) ×5 IMPLANT
CONT SPEC 4OZ CLIKSEAL STRL BL (MISCELLANEOUS) ×12 IMPLANT
CORDS BIPOLAR (ELECTRODE) ×6 IMPLANT
COVER SURGICAL LIGHT HANDLE (MISCELLANEOUS) ×12 IMPLANT
COVER TABLE BACK 60X90 (DRAPES) ×6 IMPLANT
CRADLE DONUT ADULT HEAD (MISCELLANEOUS) ×6 IMPLANT
DRAIN JACKSON PRATT 10MM FLAT (MISCELLANEOUS) ×6 IMPLANT
DRAPE PROXIMA HALF (DRAPES) ×6 IMPLANT
ELECT COATED BLADE 2.86 ST (ELECTRODE) ×12 IMPLANT
ELECT PAIRED SUBDERMAL (MISCELLANEOUS) ×6
ELECT REM PT RETURN 9FT ADLT (ELECTROSURGICAL) ×6
ELECTRODE PAIRED SUBDERMAL (MISCELLANEOUS) ×4 IMPLANT
ELECTRODE REM PT RTRN 9FT ADLT (ELECTROSURGICAL) ×4 IMPLANT
EVACUATOR SILICONE 100CC (DRAIN) ×6 IMPLANT
GAUZE SPONGE 4X4 12PLY STRL (GAUZE/BANDAGES/DRESSINGS) ×6 IMPLANT
GAUZE SPONGE 4X4 16PLY XRAY LF (GAUZE/BANDAGES/DRESSINGS) ×6 IMPLANT
GLOVE BIOGEL PI IND STRL 6.5 (GLOVE) ×4 IMPLANT
GLOVE BIOGEL PI IND STRL 7.0 (GLOVE) ×4 IMPLANT
GLOVE BIOGEL PI INDICATOR 6.5 (GLOVE) ×2
GLOVE BIOGEL PI INDICATOR 7.0 (GLOVE) ×2
GLOVE SURG SS PI 6.5 STRL IVOR (GLOVE) ×24 IMPLANT
GLOVE SURG SS PI 7.0 STRL IVOR (GLOVE) ×6 IMPLANT
GLOVE SURG SS PI 7.5 STRL IVOR (GLOVE) ×18 IMPLANT
GLOVE SURG SS PI 8.5 STRL IVOR (GLOVE) ×2
GLOVE SURG SS PI 8.5 STRL STRW (GLOVE) ×4 IMPLANT
GOWN STRL REUS W/ TWL LRG LVL3 (GOWN DISPOSABLE) ×20 IMPLANT
GOWN STRL REUS W/TWL LRG LVL3 (GOWN DISPOSABLE) ×10
HOLDER TRACH TUBE TIE MED (TUBING) ×6 IMPLANT
HOLDER TRACH TUBE VELCRO 19.5 (MISCELLANEOUS) ×6 IMPLANT
KIT BASIN OR (CUSTOM PROCEDURE TRAY) ×6 IMPLANT
KIT ROOM TURNOVER OR (KITS) ×6 IMPLANT
MARKER SKIN DUAL TIP RULER LAB (MISCELLANEOUS) ×6 IMPLANT
MATRIX HEMOSTAT SURGIFLO (HEMOSTASIS) ×6 IMPLANT
NEEDLE HYPO 25GX1X1/2 BEV (NEEDLE) ×12 IMPLANT
NS IRRIG 1000ML POUR BTL (IV SOLUTION) ×6 IMPLANT
PACK EENT II TURBAN DRAPE (CUSTOM PROCEDURE TRAY) ×6 IMPLANT
PACK SURGICAL SETUP 50X90 (CUSTOM PROCEDURE TRAY) ×6 IMPLANT
PAD ARMBOARD 7.5X6 YLW CONV (MISCELLANEOUS) ×12 IMPLANT
PATTIES SURGICAL .5 X1 (DISPOSABLE) ×6 IMPLANT
PENCIL BUTTON HOLSTER BLD 10FT (ELECTRODE) ×12 IMPLANT
PROBE NERVBE PRASS .33 (MISCELLANEOUS) ×6 IMPLANT
SHEARS HARMONIC 9CM CVD (BLADE) ×6 IMPLANT
SOLUTION ANTI FOG 6CC (MISCELLANEOUS) ×6 IMPLANT
SPONGE DRAIN TRACH 4X4 STRL 2S (GAUZE/BANDAGES/DRESSINGS) ×6 IMPLANT
SPONGE TONSIL 1 RF SGL (DISPOSABLE) ×6 IMPLANT
STAPLER VISISTAT 35W (STAPLE) ×6 IMPLANT
SUT CHROMIC 2 0 SH (SUTURE) ×6 IMPLANT
SUT CHROMIC 3 0 SH 27 (SUTURE) ×12 IMPLANT
SUT CHROMIC 4 0 PS 2 18 (SUTURE) IMPLANT
SUT ETHILON 2 0 FS 18 (SUTURE) ×18 IMPLANT
SUT ETHILON 3 0 PS 1 (SUTURE) ×6 IMPLANT
SUT MNCRL AB 4-0 PS2 18 (SUTURE) IMPLANT
SUT PLAIN 5 0 P 3 18 (SUTURE) IMPLANT
SUT SILK 2 0 (SUTURE) ×6
SUT SILK 2 0 FS (SUTURE) ×6 IMPLANT
SUT SILK 2 0 SH CR/8 (SUTURE) ×18 IMPLANT
SUT SILK 2-0 18XBRD TIE 12 (SUTURE) ×12 IMPLANT
SUT SILK 3 0 REEL (SUTURE) ×6 IMPLANT
SUT VIC AB 3-0 SH 18 (SUTURE) ×12 IMPLANT
SYR 20CC LL (SYRINGE) ×6 IMPLANT
SYR BULB 3OZ (MISCELLANEOUS) ×6 IMPLANT
SYR CONTROL 10ML LL (SYRINGE) ×12 IMPLANT
SYRINGE 10CC LL (SYRINGE) ×6 IMPLANT
TOWEL OR 17X24 6PK STRL BLUE (TOWEL DISPOSABLE) ×12 IMPLANT
TOWEL OR 17X26 10 PK STRL BLUE (TOWEL DISPOSABLE) ×6 IMPLANT
TRAY ENT MC OR (CUSTOM PROCEDURE TRAY) ×6 IMPLANT
TRAY FOLEY CATH 14FRSI W/METER (CATHETERS) IMPLANT
TUBE CONNECTING 12'X1/4 (SUCTIONS) ×1
TUBE CONNECTING 12X1/4 (SUCTIONS) ×5 IMPLANT
TUBE TRACH SHILEY  6 DIST  CUF (TUBING) ×6 IMPLANT
YANKAUER SUCT BULB TIP NO VENT (SUCTIONS) ×6 IMPLANT

## 2014-08-20 NOTE — Progress Notes (Signed)
Spoke with sister, Lelon Frohlich, via telephone who shared patient drinks "vodka daily" and "has been in rehab twice" for etoh abuse, Lelon Frohlich, staes her sister last drank yesterday. She is not aware , but does not suspect patient, abuses other substances.

## 2014-08-20 NOTE — Transfer of Care (Signed)
Immediate Anesthesia Transfer of Care Note  Patient: Shelby Larson  Procedure(s) Performed: Procedure(s): TONSILLECTOMY (Left) RADICAL LEFT NECK DISSECTION  (Left) TRACHEOSTOMY - AWAKE (N/A)  Patient Location: PACU  Anesthesia Type:General  Level of Consciousness: awake, alert  and patient cooperative  Airway & Oxygen Therapy: Patient Spontanous Breathing and Patient connected to tracheostomy mask oxygen  Post-op Assessment: Report given to RN, Post -op Vital signs reviewed and stable and Patient moving all extremities X 4  Post vital signs: Reviewed and stable  Last Vitals:  Filed Vitals:   08/20/14 0806  BP: 112/76  Pulse: 104  Temp: 70.4 C    Complications: No apparent anesthesia complications

## 2014-08-20 NOTE — Progress Notes (Signed)
Patient ID: MRY LAMIA, female   DOB: 1965-07-09, 49 y.o.   MRN: [id number]   Post op check -   Awake and alert, able to respond to questions by nodding.  Tracheostomy in place and clear. Neck incision intact without swelling. Tongue with normal mobility.  Stable post op. Nursing staff informed me that the patient has a heavy drinking history and requested DT prophylaxis.   Continue post op care.

## 2014-08-20 NOTE — Anesthesia Preprocedure Evaluation (Addendum)
Anesthesia Evaluation  Patient identified by MRN, date of birth, ID band Patient awake    Reviewed: Allergy & Precautions, NPO status , Patient's Chart, lab work & pertinent test results  Airway Mallampati: II  TM Distance: >3 FB Neck ROM: Full    Dental  (+) Dental Advisory Given, Teeth Intact   Pulmonary former smoker,          Cardiovascular     Neuro/Psych  Headaches, Seizures -, Well Controlled,  PSYCHIATRIC DISORDERS Anxiety Depression    GI/Hepatic   Endo/Other    Renal/GU      Musculoskeletal  (+) Arthritis -, Fibromyalgia -  Abdominal   Peds  Hematology  (+) anemia ,   Anesthesia Other Findings   Reproductive/Obstetrics                            Anesthesia Physical Anesthesia Plan  ASA: II  Anesthesia Plan: General and MAC   Post-op Pain Management:    Induction: Intravenous  Airway Management Planned: Tracheostomy  Additional Equipment:   Intra-op Plan:   Post-operative Plan: Possible Post-op intubation/ventilation and Post-operative intubation/ventilation  Informed Consent: I have reviewed the patients History and Physical, chart, labs and discussed the procedure including the risks, benefits and alternatives for the proposed anesthesia with the patient or authorized representative who has indicated his/her understanding and acceptance.   Dental advisory given  Plan Discussed with: CRNA, Anesthesiologist and Surgeon  Anesthesia Plan Comments:        Anesthesia Quick Evaluation

## 2014-08-20 NOTE — Progress Notes (Signed)
Subjective: S/p biopsy left tongue base/oropharynx mass, left selective neck dissection, and awake tracheotomy. Doing well, pain controlled, mouths words  Objective: Vital signs in last 24 hours: Temp:  [97.8 F (36.6 C)-98.8 F (37.1 C)] 98.6 F (37 C) (02/25 1310) Pulse Rate:  [98-110] 104 (02/25 1345) Resp:  [6-21] 20 (02/25 1345) BP: (112-147)/(76-91) 140/91 mmHg (02/25 1345) SpO2:  [97 %-100 %] 100 % (02/25 1345) FiO2 (%):  [28 %] 28 % (02/25 1330) Weight:  [54.885 kg (121 lb)] 54.885 kg (121 lb) (02/25 0753)  Neck with 6 cuffed trach patent and secure, facial nerve House-Brackmann 1/6 bilaterally in all divisions, hypoglossal and spinal accessory nerve intact and symmetric. left neck incision clean dry and intact, no hematoma, left neck JP holding bulb suction.  @LABLAST2 (wbc:2,hgb:2,hct:2,plt:2)  Recent Labs  08/20/14 0728  NA 135  K 3.1*  CL 98  CO2 25  GLUCOSE 90  BUN 10  CREATININE 0.86  CALCIUM 9.5    Medications:  Scheduled Meds: . sodium chloride   Intravenous Once  . sodium chloride   Intravenous Once  . ciprofloxacin  400 mg Intravenous Q12H  . cyclobenzaprine  10 mg Oral TID  . gabapentin  600 mg Oral TID  . magnesium gluconate  500 mg Oral Daily  . ondansetron      . pantoprazole (PROTONIX) IV  40 mg Intravenous QHS  . promethazine  12.5 mg Intravenous Once  . sertraline  100 mg Oral Daily   Continuous Infusions: . dextrose 5 % and 0.45 % NaCl with KCl 10 mEq/L    . lactated ringers 10 mL/hr at 08/20/14 0813   PRN Meds:.acetaminophen (TYLENOL) oral liquid 160 mg/5 mL, acetaminophen-codeine, diphenhydrAMINE **OR** diphenhydrAMINE, docusate, HYDROmorphone (DILAUDID) injection, HYDROmorphone (DILAUDID) injection, methocarbamol, naphazoline, nicotine, ondansetron, QUEtiapine  Assessment/Plan: NGtube position confirmed, NPO except ice chips for now, will have speech evaluate for swallowing, can use NGtube if not abel to swallow. Monitor neck JP. Will  plan on changing trach Monday to 6 cuffless. Discussed with family that with large left pharyngeal mass will need radiation and chemotherapy for definitive treatment of the primary site. Will monitor in stepdown unit.   LOS: 0 days   Ruby Cola 08/20/2014, 1:50 PM

## 2014-08-20 NOTE — Anesthesia Postprocedure Evaluation (Signed)
  Anesthesia Post-op Note  Patient: Shelby Larson  Procedure(s) Performed: Procedure(s): TONSILLECTOMY (Left) RADICAL LEFT NECK DISSECTION  (Left) TRACHEOSTOMY - AWAKE (N/A)  Patient Location: PACU  Anesthesia Type:General  Level of Consciousness: awake, alert , oriented and patient cooperative  Airway and Oxygen Therapy: Patient Spontanous Breathing  Post-op Pain: mild  Post-op Assessment: Post-op Vital signs reviewed, Patient's Cardiovascular Status Stable, Respiratory Function Stable, Patent Airway, No signs of Nausea or vomiting and Pain level controlled  Post-op Vital Signs: stable  Last Vitals:  Filed Vitals:   08/20/14 1345  BP: 140/91  Pulse: 104  Temp:   Resp: 20    Complications: No apparent anesthesia complications

## 2014-08-20 NOTE — Anesthesia Procedure Notes (Signed)
Date/Time: 08/20/2014 9:53 AM Performed by: Julian Reil Pre-anesthesia Checklist: Patient identified, Emergency Drugs available, Suction available and Patient being monitored Patient Re-evaluated:Patient Re-evaluated prior to inductionOxygen Delivery Method: Simple face mask Preoxygenation: Pre-oxygenation with 100% oxygen Intubation Type: Tracheostomy Tube size: 6.0 mm Number of attempts: 1 Placement Confirmation: positive ETCO2 and breath sounds checked- equal and bilateral Dental Injury: Teeth and Oropharynx as per pre-operative assessment  Comments: Awake tracheostomy performed by Dr. Simeon Craft. Patient tolerated well. 6.0 cuffed Shiley placed.

## 2014-08-20 NOTE — Op Note (Signed)
DATE OF OPERATION: 08/20/2014 Surgeon: Ruby Cola Procedures Performed: [phone]- left selective neck dissection with sparing of the eleventh cranial nerve, sternocleidomastoid muscle, and internal jugular vein.  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  ESTIMATED BLOOD LOSS: less than 250 mL.  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 She is taken to the OR for debulking/biopsy of the left oropharynx/tongue base mass, tracheotomy to secure the airway, and left neck dissection. DESCRIPTION OF OPERATION: The patient was brought into the operating room and placed on the OR table in a supine position. The anterior neck landmarks were marked out and 1% lidocaine with epinephrine was injected into the anterior neck and subcutaneous tissues. A horizontal tracheotomy incision was marked out then prepped with sterile betadine. I divided the skin with the Bovie and then dissected down to and through the platysma and then the strap muscles with the Bovie. The strap muscles were retracted laterally and then the thyroid isthmus was identified, divided with hemostats and silk sutures, and the anterior trachea was identified. I cleared off the anterior cricoid and the tracheal rings 1-4. I made a horizontal tracheotomy  between rings 2 and 3, made a Bjork flap with a vicryl suture and the scissors, and placed a 6 cuffed Shiley tracheostomy tube in the tracheotomy and inflated the cuff. This was secured with velcro ties, silk sutures, and connected to the circuit, and the patient was placed under general tracheal anesthesia.  Next the table was turned, and I placed the Crowe-Davis tongue retractor. A large exophytic left pharynx and left tongue base mass was identified extending past the midline and pushing the epiglottis posteriorly. The true vocal folds could not be identified. I used the Allis and Bovie to take generous biopsies of the mass that were positive for invasive squamous cell carcinoma on frozen section. I then obtained hemostasis with the suction Bovie. I placed a nasogastric tube through the right nasal cavity and watched it pass into the esophageal introitus and anesthesia confirmed placement by insufflation of the NGtube. I secured the NGtube with tape.  The bed was turned back to neutral and the patient was prepped and draped in a sterile fashion. A left neck  Hemi-apron  incision was then made in the left neck using the 15-blade. The incision was carried down through subcutaneous tissues and platysmal muscle with the use of electrocautery. A subplatysmal flap was elevated to the level of the angle of the mandible and an inferior subplatysmal flap was elevated down to the level of the clavicle. The skin flaps were retracted using interrupted sutures of 2-0 silk. Anterior jugular veins were dissected out and ligated. Dissection initially began superiorly in level II. The left spinal accessory nerve was identified using the mosquito and debakeys and the surrounding fibrofatty tissue and lymphatic tissue, along with a firm level II neck mass and several enlarged lymph nodes were dissected from around the nerve and level IIa and  IIb were dissected anteriorly and inferiorly. The Bovie and mosquito were used to  elevate the posterior aspect of level III off of the cervical outflow nerves and level III was  dissected anteriorly off of the phrenic nerve which was identified and preserved. The internal jugular vein was identified and levels IIa/IIb, and III were dissected off of the IJV. The vein was cleared from the surrounding soft tissues and from the carotid sheath. The left phrenic and vagus nerves and CN IX were identified and preserved throughout. The carotid artery was identified along with the vagus nerve, making sure that the vagus nerve was free from the area of dissection. Lvels 2-3 were elevated off of the internal jugular and inferiorly off of the omohyoid muscle and removed. Superiorly the the marginal mandibular nerve was identified and preserved and the post-vascular nodes posterior to the marginal mandibular nerve was also removed. The wound was then irrigated with copious amounts of normal saline solution. Hemostasis was obtained with electrocautery, bipolar cautery, as well as with 2-0 silk suture ligatures as needed. A 10 mm Jackson-Pratt drain was placed within the depths of the wound, exiting the inferior aspect of the wound. It was then sutured into position with an interrupted 2-0 silk suture. The skin flaps were released and returned to their normal anatomic position. The flaps were then closed in layers, first closing the platysmal layer with running sutures of 3-0 Vicryl and closing the skin with interrupted 3-0 chromic. The procedure was then ended with the patient tolerating the procedure well. The patient was transported to the recovery room in good condition.    Dr. Ruby Cola was present and performed the entire procedure. 08/20/2014   12:40 PM Ruby Cola

## 2014-08-21 ENCOUNTER — Inpatient Hospital Stay (HOSPITAL_COMMUNITY): Payer: 59

## 2014-08-21 ENCOUNTER — Encounter (HOSPITAL_COMMUNITY): Payer: Self-pay | Admitting: Otolaryngology

## 2014-08-21 LAB — COMPREHENSIVE METABOLIC PANEL
ALK PHOS: 76 U/L (ref 39–117)
ALT: 12 U/L (ref 0–35)
AST: 27 U/L (ref 0–37)
Albumin: 3.3 g/dL — ABNORMAL LOW (ref 3.5–5.2)
Anion gap: 13 (ref 5–15)
BILIRUBIN TOTAL: 1.1 mg/dL (ref 0.3–1.2)
BUN: 5 mg/dL — ABNORMAL LOW (ref 6–23)
CHLORIDE: 96 mmol/L (ref 96–112)
CO2: 26 mmol/L (ref 19–32)
Calcium: 7.9 mg/dL — ABNORMAL LOW (ref 8.4–10.5)
Creatinine, Ser: 0.84 mg/dL (ref 0.50–1.10)
GFR calc Af Amer: >90 mL/min (ref >90)
GFR calc non Af Amer: 81 mL/min — ABNORMAL LOW (ref >90)
GLUCOSE: 108 mg/dL — AB (ref 70–99)
POTASSIUM: 4 mmol/L (ref 3.5–5.1)
SODIUM: 135 mmol/L (ref 135–145)
Total Protein: 6.4 g/dL (ref 6.0–8.3)

## 2014-08-21 LAB — TYPE AND SCREEN
ABO/RH(D): A POS
Antibody Screen: NEGATIVE
Unit division: 0
Unit division: 0
Unit division: 0

## 2014-08-21 LAB — CBC
HCT: 32.4 % — ABNORMAL LOW (ref 36.0–46.0)
Hemoglobin: 10.7 g/dL — ABNORMAL LOW (ref 12.0–15.0)
MCH: 29.9 pg (ref 26.0–34.0)
MCHC: 33 g/dL (ref 30.0–36.0)
MCV: 90.5 fL (ref 78.0–100.0)
Platelets: 210 10*3/uL (ref 150–400)
RBC: 3.58 MIL/uL — ABNORMAL LOW (ref 3.87–5.11)
RDW: 17.6 % — AB (ref 11.5–15.5)
WBC: 12.9 10*3/uL — ABNORMAL HIGH (ref 4.0–10.5)

## 2014-08-21 LAB — PHOSPHORUS: Phosphorus: 3.5 mg/dL (ref 2.3–4.6)

## 2014-08-21 LAB — MAGNESIUM: Magnesium: 1.4 mg/dL — ABNORMAL LOW (ref 1.5–2.5)

## 2014-08-21 MED ORDER — BACITRACIN ZINC 500 UNIT/GM EX OINT
TOPICAL_OINTMENT | Freq: Three times a day (TID) | CUTANEOUS | Status: DC
  Administered 2014-08-21 – 2014-08-22 (×3): via TOPICAL
  Administered 2014-08-22 – 2014-08-23 (×3): 1 via TOPICAL
  Administered 2014-08-23 – 2014-08-27 (×13): via TOPICAL
  Administered 2014-08-27: 1 via TOPICAL
  Administered 2014-08-28: 11:00:00 via TOPICAL
  Filled 2014-08-21 (×2): qty 28.35

## 2014-08-21 MED ORDER — PNEUMOCOCCAL VAC POLYVALENT 25 MCG/0.5ML IJ INJ: 0.5000 mL | INJECTION | INTRAMUSCULAR | Status: DC | PRN

## 2014-08-21 NOTE — Evaluation (Signed)
Passy-Muir Speaking Valve - Evaluation Patient Details  Name: Shelby Larson MRN: [id number] Date of Birth: 1965-09-21  Today's Date: 08/21/2014 Time: 1340-1430 SLP Time Calculation (min) (ACUTE ONLY): 50 min  Past Medical History:  Past Medical History  Diagnosis Date  . Seizures     takes Gabapentin  . Heart murmur     as a child  . Chronic bronchitis   . Anxiety     panic attacks  . Depression   . Headache     onset a few months ago  . Neuropathy     had it in both hands  . Arthritis   . Fibromyalgia   . Cancer     throat cancer  . Anemia   . Rosacea    Past Surgical History:  Past Surgical History  Procedure Laterality Date  . Wisdom tooth extraction    . Tonsillectomy Left 08/20/2014    Procedure: TONSILLECTOMY;  Surgeon: Ruby Cola, MD;  Location: [facility] OR;  Service: ENT;  Laterality: Left;  . Radical neck dissection Left 08/20/2014    Procedure: RADICAL LEFT NECK DISSECTION ;  Surgeon: Ruby Cola, MD;  Location: [place];  Service: ENT;  Laterality: Left;  . Tracheostomy tube placement N/A 08/20/2014    Procedure: TRACHEOSTOMY - AWAKE;  Surgeon: Ruby Cola, MD;  Location: [facility] OR;  Service: ENT;  Laterality: N/A;   HPI:  49 y.o female presented to the ED on 2/13 with hemoptysis and left neck mass.  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  Pt admitted 2/25 for tracheotomy,  biopsy left tongue base/oropharynx mass, left selective neck dissection.  Has #6 cuffed trach.  Plan is for radiotherapy with chemotherapy; PEG. Orders received for FEES, Passy-Muir Valve.    Assessment / Plan / Recommendation Clinical Impression  Pt seen today for FEES and PMV assessment.  FEES: Attempted passage of endoscope through right and left nares without success given narrow nasal passages, pt's anxiety, and c/o pain.  Effort aborted per pt's request.   PMV: cuff deflated; minimal secretions produced.  Sp02 88-91%.  PMV placed for limited  intervals of 1-3 respiratory cycles.  Unable to achieve phonation; c/o difficulty breathing with cuff deflated.  Trial of valve discontinued and cuff reinflated per pt's request.  Poor toleration likely due to edema s/p surgery, trach size.  Plan is for downsize of trach to #4 on Monday.   Recommendations: 1) PMV trials with SLP; will f/u next date; 2) continue frequent oral care; ice chips as tolerated;  3) MBS prior to D/C home.  Extensive education completed with pt; she communicates via writing, mouthing words; agrees with plan.     SLP Assessment  Patient needs continued Speech Language Pathology Services    Follow Up Recommendations  Outpatient SLP    Frequency and Duration min 3x week  1 week        SLP Goals     PMSV Trial  PMSV was placed for: 1-3 respiratory cycles Able to redirect subglottic air through upper airway: No Able to Attain Phonation: No Able to Expectorate Secretions: No attempts Breath Support for Phonation: Inadequate Respirations During Trial: 18 SpO2 During Trial: (!) 89 % Pulse During Trial: 100 Behavior: Alert;Anxious   Tracheostomy Tube    #6 Shiley   Vent Dependency  FiO2 (%): 28 %    Cuff Deflation Trial Tolerated Cuff Deflation: Yes Length of Time for Cuff Deflation Trial: 15 Behavior: Anxious;Alert Cuff Deflation Trial - Comments: saturating  at 90%   Juan Quam Laurice 08/21/2014, 2:56 PM Estill Bamberg L. Tivis Ringer, [place] CCC/SLP Pager [phone]

## 2014-08-21 NOTE — Progress Notes (Signed)
Utilization review completed.  

## 2014-08-21 NOTE — Progress Notes (Signed)
Foley removed per MD order. Pt tolerated procedure well, will continue to monitor.

## 2014-08-21 NOTE — Progress Notes (Signed)
New NG tube inserted on second attempt. Patient tolerated procedure well. Vitals signs stable. Placed order for x-ray verification. Will continue to monitor.   Lum Babe, RN

## 2014-08-21 NOTE — Progress Notes (Signed)
Subjective: POD#1 from awake tracheotomy, biopsy oropharynx cancer, and left selective neck dissection. Did well overnight, some expected odynophagia/dysphagia, awake, alert  Objective: Vital signs in last 24 hours: Temp:  [98.1 F (36.7 C)-99.9 F (37.7 C)] 98.2 F (36.8 C) (02/26 0700) Pulse Rate:  [93-112] 105 (02/26 0632) Resp:  [7-34] 14 (02/26 0632) BP: (127-161)/(83-96) 141/83 mmHg (02/26 0252) SpO2:  [92 %-100 %] 99 % (02/26 0632) FiO2 (%):  [28 %] 28 % (02/26 9622) Weight:  [59.7 kg (131 lb 9.8 oz)] 59.7 kg (131 lb 9.8 oz) (02/25 1550)  CN 2-12 intact and symmetric, left neck JP with serosanguinous fluid, no chyle, 18mL out since yesterday, tracheostomy secure with minimal sanguinous drainage, neck supple with left neck incision clean dry and intact.  @LABLAST2 (wbc:2,hgb:2,hct:2,plt:2)  Recent Labs  08/20/14 1446 08/21/14 0242  NA 137 135  K 4.3 4.0  CL 101 96  CO2 19 26  GLUCOSE 72 108*  BUN 9 5*  CREATININE 0.84 0.84  CALCIUM 8.5 7.9*    Medications:  Scheduled Meds: . sodium chloride   Intravenous Once  . sodium chloride   Intravenous Once  . bacitracin   Topical TID  . ciprofloxacin  400 mg Intravenous Q12H  . cyclobenzaprine  10 mg Oral TID  . gabapentin  600 mg Oral TID  . magnesium gluconate  500 mg Oral Daily  . pantoprazole (PROTONIX) IV  40 mg Intravenous QHS  . pneumococcal 23 valent vaccine  0.5 mL Intramuscular Tomorrow-1000  . promethazine  12.5 mg Intravenous Once  . sertraline  100 mg Oral Daily   Continuous Infusions: . dextrose 5 % and 0.45 % NaCl with KCl 10 mEq/L 75 mL/hr at 08/20/14 2233  . lactated ringers 10 mL/hr at 08/20/14 0813   PRN Meds:.acetaminophen (TYLENOL) oral liquid 160 mg/5 mL, acetaminophen-codeine, diphenhydrAMINE **OR** diphenhydrAMINE, docusate, HYDROmorphone (DILAUDID) injection, methocarbamol, naphazoline, nicotine, ondansetron, QUEtiapine  Assessment/Plan: POD#1 from awake tracheostomy, left SND, biopsy  oropharynx T4aN2bM0 squamous cell carcinoma. I discussed with the patient that the oropharynx mass would be unresectable without essentially a commando procedure with pharyngolaryngectomy so radiotherapy with chemotherapy will be the primary treatment for the primary cancer. I did recommend to her that she have a VIR PEG tube placed, as I think the likelihood of her being able to take sufficient oral nutrition through her post-op period and her chemoradiation is very low. She did agree to the PEG so I put in the VIR order for the PEG. Will keep her NPO for now, have speech assess swallowing. Likely can deflate the trach cuff over the weekend. Will plan on changing her trach on Monday and can just leave her neck JP in until I change the trach. Will need home trach supplies, suction, etc. Can remove foley catheter. Hopefully can go to floor ([facility]) over weekend.   LOS: 1 day   Ruby Cola 08/21/2014, 8:31 AM

## 2014-08-21 NOTE — Progress Notes (Signed)
Patient ID: Shelby Larson, female   DOB: 29-Sep-1965, 49 y.o.   MRN: [id number]    IR aware of percutaneous gastric tube placement request  Pt oropharynx ca L neck dissection tracheotomy FEES study today  Will order CT abd to evaluate anatomy  Need this to determine if candidate for perc G tube  Will check CT over weekend Poss G tube 2/29

## 2014-08-21 NOTE — Progress Notes (Signed)
Pt attempting to use BSC, while standing to wipe self her NG tube fell out. Dr. Simeon Craft called with order to replace tube. New 61fr tube place with verification by KUB. Upon entering room to assess patient with new night shift nurse, new NG tube was found on the floor. Night shift nurse to attempt new insert. Will continue to monitor.

## 2014-08-22 ENCOUNTER — Inpatient Hospital Stay (HOSPITAL_COMMUNITY): Payer: 59

## 2014-08-22 MED ORDER — HYDROCODONE-ACETAMINOPHEN 7.5-325 MG/15ML PO SOLN
10.0000 mL | ORAL | Status: DC | PRN
  Administered 2014-08-22 (×2): 15 mL
  Filled 2014-08-22 (×2): qty 15

## 2014-08-22 MED ORDER — JEVITY 1.2 CAL PO LIQD
1000.0000 mL | ORAL | Status: DC
  Filled 2014-08-22 (×2): qty 1000

## 2014-08-22 MED ORDER — JEVITY 1.2 CAL PO LIQD
1000.0000 mL | ORAL | Status: DC
  Administered 2014-08-22: 1000 mL
  Filled 2014-08-22 (×8): qty 1000

## 2014-08-22 MED ORDER — KCL IN DEXTROSE-NACL 10-5-0.45 MEQ/L-%-% IV SOLN
INTRAVENOUS | Status: DC
  Administered 2014-08-23 – 2014-08-28 (×7): via INTRAVENOUS
  Filled 2014-08-22 (×18): qty 1000

## 2014-08-22 MED ORDER — PRO-STAT SUGAR FREE PO LIQD
30.0000 mL | Freq: Every day | ORAL | Status: DC
  Administered 2014-08-22 – 2014-08-28 (×4): 30 mL
  Filled 2014-08-22 (×7): qty 30

## 2014-08-22 MED ORDER — RESOURCE THICKENUP CLEAR PO POWD
ORAL | Status: DC | PRN
  Filled 2014-08-22 (×2): qty 125

## 2014-08-22 MED ORDER — LORAZEPAM 2 MG/ML IJ SOLN
1.0000 mg | Freq: Four times a day (QID) | INTRAMUSCULAR | Status: DC | PRN
  Administered 2014-08-22 – 2014-08-23 (×3): 1 mg via INTRAVENOUS
  Filled 2014-08-22 (×4): qty 1

## 2014-08-22 NOTE — Consult Note (Signed)
Chief Complaint: dysphagia  Referring Physician(s): Shelby Larson  History of Present Illness: Shelby Larson is a 49 y.o. 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 Seizures     takes Gabapentin  . Heart murmur     as a child  . Chronic bronchitis   . Anxiety     panic attacks  . Depression   . Headache     onset a few months ago  . Neuropathy     had it in both hands  . Arthritis   . Fibromyalgia   . Cancer     throat cancer  . Anemia   . Rosacea     Past Surgical History  Procedure Laterality Date  . Wisdom tooth extraction    . Tonsillectomy Left 08/20/2014    Procedure: TONSILLECTOMY;  Surgeon: Shelby Cola, MD;  Location: [facility] OR;  Service: ENT;  Laterality: Left;  . Radical neck dissection Left 08/20/2014    Procedure: RADICAL LEFT NECK DISSECTION ;  Surgeon: Shelby Cola, MD;  Location: [place];  Service: ENT;  Laterality: Left;  . Tracheostomy tube placement N/A 08/20/2014    Procedure: TRACHEOSTOMY - AWAKE;  Surgeon: Shelby Cola, MD;  Location: [place];  Service: ENT;  Laterality: N/A;    Allergies: Bee venom; Penicillins; and Latex  Medications: Prior to Admission medications   Medication Sig Start Date End Date Taking? Authorizing Provider  Acetaminophen (CHLORASEPTIC SORE THROAT PO) Take 2 sprays by mouth daily as needed (sore throat).   Yes Historical Provider, MD  fluticasone (FLONASE) 50 MCG/ACT nasal spray Place 2 sprays into both nostrils daily.   Yes Historical Provider, MD  gabapentin (NEURONTIN) 600 MG tablet Take 600 mg by mouth 3 (three) times daily.   Yes Historical Provider, MD  HYDROcodone-acetaminophen (NORCO/VICODIN) 5-325 MG per tablet Take  1 tablet by mouth every 6 (six) hours as needed for severe pain. 08/08/14  Yes Shelby Muskrat, MD  lidocaine (XYLOCAINE) 2 % solution Use as directed 20 mLs in the mouth or throat as needed for mouth pain. 08/08/14  Yes Shelby Muskrat, MD  methocarbamol (ROBAXIN) 500 MG tablet Take 500 mg by mouth every 8 (eight) hours as needed for muscle spasms.   Yes Historical Provider, MD  Multiple Vitamin (MULTIVITAMIN WITH MINERALS) TABS tablet Take 1 tablet by mouth daily.   Yes Historical Provider, MD  naproxen sodium (ANAPROX) 220 MG tablet Take 440 mg by mouth daily as needed (pain).   Yes Historical Provider, MD  QUEtiapine (SEROQUEL) 50 MG tablet Take 50 mg by mouth daily as needed (sleep).   Yes Historical Provider, MD  sertraline (ZOLOFT) 100 MG tablet Take 100 mg by mouth daily.   Yes Historical Provider, MD  tetrahydrozoline-zinc (VISINE-AC) 0.05-0.25 % ophthalmic solution Place 2 drops into both eyes daily as needed (dry eyes).   Yes Historical Provider, MD  cyclobenzaprine (FLEXERIL) 10 MG tablet Take 10 mg by mouth 3 (three) times daily.    Historical Provider, MD  MAGNESIUM PO Take 2 tablets by mouth daily.    Historical Provider, MD     Family History  Problem Relation Age of Onset  . Neuropathy Mother   . Hypertension Mother   . Alcoholism  Father   . Alcoholism Sister     History   Social History  . Marital Status: Single    Spouse Name: N/A  . Number of Children: N/A  . Years of Education: N/A   Social History Main Topics  . Smoking status: Former Smoker    Types: Cigarettes    Quit date: 06/18/2013  . Smokeless tobacco: Never Used  . Alcohol Use: Yes     Comment: occasional  . Drug Use: No  . Sexual Activity: Not on file   Other Topics Concern  . None   Social History Narrative     Review of Systems: A 12 point ROS discussed and pertinent positives are indicated in the HPI above.  All other systems are negative.  Review of Systems  Constitutional: Positive for  activity change, appetite change and fatigue. Negative for fever.  HENT: Positive for sore throat, trouble swallowing and voice change. Negative for drooling.   Respiratory: Negative for shortness of breath.   Gastrointestinal: Positive for nausea. Negative for abdominal pain.  Genitourinary: Negative for difficulty urinating.  Neurological: Positive for weakness.  Psychiatric/Behavioral: Negative for behavioral problems and confusion.    Vital Signs: BP 156/97 mmHg  Pulse 103  Temp(Src) 98.9 F (37.2 C) (Oral)  Resp 20  Ht 5\' 6"  (1.676 m)  Wt 59.7 kg (131 lb 9.8 oz)  BMI 21.25 kg/m2  SpO2 95%  LMP   Physical Exam  Constitutional: She is oriented to person, place, and time. She appears well-developed and well-nourished.  HENT:  trach  Neck:  Recent L neck dissection  Pulmonary/Chest: Effort normal. No respiratory distress.  Abdominal: Soft. Bowel sounds are normal. There is no tenderness.  Musculoskeletal: Normal range of motion.  Neurological: She is alert and oriented to person, place, and time.  Skin: Skin is warm and dry.  Psychiatric: She has a normal mood and affect. Her behavior is normal. Judgment and thought content normal.  Nursing note and vitals reviewed.   Mallampati Score:  MD Evaluation Airway: Other (comments) Airway comments: trach Heart: WNL Abdomen: WNL Chest/ Lungs: WNL ASA  Classification: 3 Mallampati/Airway Score: Three  Imaging: Ct Abdomen Wo Contrast  08/21/2014   CLINICAL DATA:  Preop gastrostomy tube planning.  EXAM: CT ABDOMEN WITHOUT CONTRAST  TECHNIQUE: Multidetector CT imaging of the abdomen was performed following the standard protocol without IV contrast.  COMPARISON:  Abdomen 08/20/2014  FINDINGS: Consolidation or atelectasis in both lung bases. No pleural effusions. Enteric tube tip terminates in the distal esophagus.  The unenhanced appearance of the liver, spleen, gallbladder, pancreas, adrenal glands, abdominal aorta, inferior  vena cava, and retroperitoneal lymph nodes is unremarkable. Small accessory spleen. 2 mm stone in the midportion right kidney. No hydronephrosis in either kidney. Stomach is gas-filled without abnormal distention or wall thickening. No evidence of colonic interposition anterior to the stomach. Small bowel are decompressed. Diffusely stool-filled colon. No free air or free fluid in the abdomen. Visualized abdominal wall musculature appears intact. Degenerative disc disease at L5-S1. No destructive bone lesions.  IMPRESSION: Consolidation in both lung bases. Enteric tube tip terminates in the distal esophagus. Nonobstructing stone in the right kidney. No evidence of bowel obstruction or dilatation.   Electronically Signed   By: Lucienne Capers M.D.   On: 08/21/2014 18:51   Ct Soft Tissue Neck W Contrast  08/08/2014   CLINICAL DATA:  Hemoptysis.  EXAM: CT NECK WITH CONTRAST  CHEST CT WITH CONTRAST  TECHNIQUE: Multidetector CT imaging of the neck  and chest was performed using the standard protocol following the bolus administration of intravenous contrast.  CONTRAST:  70mL OMNIPAQUE IOHEXOL 300 MG/ML  SOLN  COMPARISON:  None.  FINDINGS: Pharynx and larynx: 3.2 cm mass in the left oropharynx, centered to the left with broad contact with the left posterior tongue and glossotonsillar sulcus. Probable early extension into the pre epiglottic fat. The margin with the lingual epiglottis is indistinct, but this could be displacement rather than invasion. No glottis or infraglottic tumor. No evidence of cord paralysis.  Adenopathy in the left cervical chain, levels IIA/B, III, and IV. The largest left node is in the jugulodigastric measuring 23 mm in maximal diameter. The right jugulodigastric node is mildly enlarged but appears heterogeneous in enhancement. There is mild but asymmetric enlargement of the lateral left retropharyngeal lymph node, without necrosis.  Salivary glands: Negative  Thyroid: Negative  Vascular:  Cervical carotid atherosclerosis without hemodynamically significant stenosis.  Limited intracranial: Negative  Mastoids and visualized paranasal sinuses: Negative  Skeleton: No evidence of metastasis  THORACIC INLET/BODY WALL:  No acute abnormality.  MEDIASTINUM:  Normal heart size. No pericardial effusion. No acute vascular abnormality. No adenopathy.  LUNG WINDOWS:  Suspect mild emphysema. No consolidation. No effusion. No suspicious pulmonary nodule.  UPPER ABDOMEN:  No evidence of metastatic disease  OSSEOUS:  No acute fracture.  No suspicious lytic or blastic lesions.  IMPRESSION: 1. 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 The mass causes moderate to advanced airway narrowing. For staging, the tumor involves the tongue base, left glossotonsillar sulcus, lingual epiglottis and likely preepiglottic fat. 2. Bilateral cervical adenopathy, levels noted above. A lateral left retropharyngeal lymph node is also suspicious due to asymmetry. 3. No evidence of thoracic metastatic disease.   Electronically Signed   By: Monte Fantasia M.D.   On: 08/08/2014 12:42   Ct Chest W Contrast  08/08/2014   CLINICAL DATA:  Hemoptysis.  EXAM: CT NECK WITH CONTRAST  CHEST CT WITH CONTRAST  TECHNIQUE: Multidetector CT imaging of the neck and chest was performed using the standard protocol following the bolus administration of intravenous contrast.  CONTRAST:  47mL OMNIPAQUE IOHEXOL 300 MG/ML  SOLN  COMPARISON:  None.  FINDINGS: Pharynx and larynx: 3.2 cm mass in the left oropharynx, centered to the left with broad contact with the left posterior tongue and glossotonsillar sulcus. Probable early extension into the pre epiglottic fat. The margin with the lingual epiglottis is indistinct, but this could be displacement rather than invasion. No glottis or infraglottic tumor. No evidence of cord paralysis.  Adenopathy in the left cervical chain, levels IIA/B, III, and IV. The largest left node is in the  jugulodigastric measuring 23 mm in maximal diameter. The right jugulodigastric node is mildly enlarged but appears heterogeneous in enhancement. There is mild but asymmetric enlargement of the lateral left retropharyngeal lymph node, without necrosis.  Salivary glands: Negative  Thyroid: Negative  Vascular: Cervical carotid atherosclerosis without hemodynamically significant stenosis.  Limited intracranial: Negative  Mastoids and visualized paranasal sinuses: Negative  Skeleton: No evidence of metastasis  THORACIC INLET/BODY WALL:  No acute abnormality.  MEDIASTINUM:  Normal heart size. No pericardial effusion. No acute vascular abnormality. No adenopathy.  LUNG WINDOWS:  Suspect mild emphysema. No consolidation. No effusion. No suspicious pulmonary nodule.  UPPER ABDOMEN:  No evidence of metastatic disease  OSSEOUS:  No acute fracture.  No suspicious lytic or blastic lesions.  IMPRESSION: 1. 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 The mass  causes moderate to advanced airway narrowing. For staging, the tumor involves the tongue base, left glossotonsillar sulcus, lingual epiglottis and likely preepiglottic fat. 2. Bilateral cervical adenopathy, levels noted above. A lateral left retropharyngeal lymph node is also suspicious due to asymmetry. 3. No evidence of thoracic metastatic disease.   Electronically Signed   By: Monte Fantasia M.D.   On: 08/08/2014 12:42   Dg Chest Port 1 View  08/21/2014   CLINICAL DATA:  Tracheostomy evaluation.  EXAM: PORTABLE CHEST - 1 VIEW  COMPARISON:  08/20/2014.  FINDINGS: Tracheostomy good position. Low lung volumes with bibasilar subsegmental atelectasis and trace effusions. Nasogastric tube tip lies below the diaphragm along the greater curvature of the stomach. No pneumothorax.  IMPRESSION: Lung volumes are diminished compared with priors. No definite active infiltrates.   Electronically Signed   By: Rolla Flatten M.D.   On: 08/21/2014 07:44   Dg Chest  Port 1 View  08/20/2014   CLINICAL DATA:  Tracheostomy placement  EXAM: PORTABLE CHEST - 1 VIEW  COMPARISON:  Chest radiograph June 30, 2014 and chest CT August 08, 2014  FINDINGS: Tracheostomy catheter tip is 5.6 cm above the carina. Nasogastric tube tip and side port are in the stomach. No pneumothorax. Lungs are clear. Heart size and pulmonary vascularity are normal. No adenopathy. No bone lesions.  IMPRESSION: Tracheostomy as described without pneumothorax.  Lungs clear.   Electronically Signed   By: Lowella Grip III M.D.   On: 08/20/2014 14:35   Dg Abd Portable 1v  08/22/2014   CLINICAL DATA:  NG tube placement.  EXAM: PORTABLE ABDOMEN - 1 VIEW  COMPARISON:  Abdominal radiograph earlier this day.  FINDINGS: Imaging focus down the upper abdomen and lower chest for NG tube placement. The enteric tube has been advanced, side-port now just beyond the gastroesophageal junction. Endotracheal tube in place, 5.3 cm from the carina. Bibasilar opacities, may reflect atelectasis, pneumonia, or aspiration.  IMPRESSION: Tip and side port of the enteric tube below the diaphragm in the stomach.   Electronically Signed   By: Jeb Levering M.D.   On: 08/22/2014 00:00   Dg Abd Portable 1v  08/21/2014   CLINICAL DATA:  NG tube placement.  EXAM: PORTABLE ABDOMEN - 1 VIEW  COMPARISON:  08/20/2014  FINDINGS: Nonobstructive bowel gas pattern. Moderate stool throughout the colon. No organomegaly or suspicious calcification. NG tube tip is in the distal esophagus near the GE junction.  IMPRESSION: NG tube tip in the distal esophagus near the GE junction.   Electronically Signed   By: Rolm Baptise M.D.   On: 08/21/2014 18:54   Dg Abd Portable 1v  08/20/2014   CLINICAL DATA:  Nasogastric tube placement  EXAM: PORTABLE ABDOMEN - 1 VIEW  COMPARISON:  None.  FINDINGS: Nasogastric tube tip and side port are in the stomach. There is stool throughout the colon. There is an overall paucity of gas. No free air. Lung bases  are clear.  IMPRESSION: Nasogastric tube tip and side port in stomach. Paucity of gas, a finding that may be normal but a finding that also may be indicative of enteritis or early ileus. No free air.   Electronically Signed   By: Lowella Grip III M.D.   On: 08/20/2014 14:33    Labs:  CBC:  Recent Labs  08/08/14 0959 08/20/14 0728 08/20/14 1446 08/21/14 0242  WBC 8.8 11.9* 8.1 12.9*  HGB 10.1* 8.2* 8.8* 10.7*  HCT 31.1* 26.0* 26.8* 32.4*  PLT 243 358 228  210    COAGS:  Recent Labs  08/20/14 1446  INR 1.11  APTT 28    BMP:  Recent Labs  08/08/14 0959 08/20/14 0728 08/20/14 1446 08/21/14 0242  NA 138 135 137 135  K 4.4 3.1* 4.3 4.0  CL 105 98 101 96  CO2 27 25 19 26   GLUCOSE 81 90 72 108*  BUN 23 10 9  5*  CALCIUM 9.6 9.5 8.5 7.9*  CREATININE 0.57 0.86 0.84 0.84  GFRNONAA >90 79* 81* 81*  GFRAA >90 >90 >90 >90    LIVER FUNCTION TESTS:  Recent Labs  08/20/14 1446 08/21/14 0242  BILITOT 1.8* 1.1  AST 31 27  ALT 14 12  ALKPHOS 76 76  PROT 6.3 6.4  ALBUMIN 3.4* 3.3*    TUMOR MARKERS: No results for input(s): AFPTM, CEA, CA199, CHROMGRNA in the last 8760 hours.  Assessment and Plan:  Oropharynx cancer Debulking; L neck dissection 2/25 Need for nutritional support Dysphagia Scheduled for direct stick gastric tube placement in IR 2/29 Will check wbc that day afeb Vanco on call Pt aware of procedure benefits and risks including but not limited to Infection; bleeding; organ damage; damage to surrounding structures Agreeable to proceed----friend in room   Thank you for this interesting consult.  I greatly enjoyed meeting Shelby Larson and look forward to participating in their care.  Signed: Reann Dobias A 08/22/2014, 10:46 AM   I spent a total of 40 Minutes in face to face in clinical consultation, greater than 50% of which was counseling/coordinating care for percutaneous direct stick G tube

## 2014-08-22 NOTE — Procedures (Signed)
Objective Swallowing Evaluation: Modified Barium Swallowing Study  Patient Details  Name: Shelby Larson MRN: [id number] Date of Birth: 09/12/1965  Today's Date: 08/22/2014 Time: SLP Start Time (ACUTE ONLY): 1230-SLP Stop Time (ACUTE ONLY): 1300 SLP Time Calculation (min) (ACUTE ONLY): 30 min  Past Medical History:  Past Medical History  Diagnosis Date  . Seizures     takes Gabapentin  . Heart murmur     as a child  . Chronic bronchitis   . Anxiety     panic attacks  . Depression   . Headache     onset a few months ago  . Neuropathy     had it in both hands  . Arthritis   . Fibromyalgia   . Cancer     throat cancer  . Anemia   . Rosacea    Past Surgical History:  Past Surgical History  Procedure Laterality Date  . Wisdom tooth extraction    . Tonsillectomy Left 08/20/2014    Procedure: TONSILLECTOMY;  Surgeon: Ruby Cola, MD;  Location: [facility] OR;  Service: ENT;  Laterality: Left;  . Radical neck dissection Left 08/20/2014    Procedure: RADICAL LEFT NECK DISSECTION ;  Surgeon: Ruby Cola, MD;  Location: [place];  Service: ENT;  Laterality: Left;  . Tracheostomy tube placement N/A 08/20/2014    Procedure: TRACHEOSTOMY - AWAKE;  Surgeon: Ruby Cola, MD;  Location: [facility] OR;  Service: ENT;  Laterality: N/A;   HPI:  HPI: 49 y.o female presented to the ED on 2/13 with hemoptysis and left neck mass.  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  Pt admitted 2/25 for tracheotomy,  biopsy left tongue base/oropharynx mass, left selective neck dissection.  Has #6 cuffed trach.  Plan is for radiotherapy with chemotherapy; PEG 2/29.    No Data Recorded  Assessment / Plan / Recommendation CHL IP CLINICAL IMPRESSIONS 08/22/2014  Dysphagia Diagnosis Moderate pharyngeal phase dysphagia  Clinical impression Pt presents with a moderate dysphagia attributable to lingual/pharyngeal mass, post-surgical effects, and presence of open trach (unable to safely  use PMV at this time). Pt groggy for study, requiring frequent stimulation to remain awake; when awake, she was anxious.  Notable was reduced hyolaryngeal mobility and the appearance of reduced pharyngeal space.  Repetitive swallows were required in order to propel purees through pharynx and UES.  Trace aspiration of thin liquids occurred during and after the swallow; pt without awareness of aspirate.  Limited nectar-thick liquids were consumed without penetration/aspiration.   Pt is scheduled for trach downsize and PEG 2/29.  Smaller trach should allow better tolerance of PMV and may allow more functional eating.  (Per ENT report, "true vocal folds are visualized and are pushed to the patient's right but are mobile with no obvious masses and intact adduction and abduction bilaterally.")   For now, recommend continuing ice chips, allowing purees and nectar-thick liquids per pt's request.  SLP will follow.  Pt will need OPSLP to address mobility of swallowing musculature prior to onset of radiation treatment.       CHL IP TREATMENT RECOMMENDATION 08/22/2014  Treatment Plan Recommendations Therapy as outlined in treatment plan below     CHL IP DIET RECOMMENDATION 08/22/2014  Diet Recommendations Ice chips PRN after oral care;Dysphagia 1 (Puree);Nectar-thick liquid  Liquid Administration via Straw;Cup  Medication Administration Crushed with puree  Compensations (None)  Postural Changes and/or Swallow Maneuvers (None)     CHL IP OTHER RECOMMENDATIONS 08/22/2014  Recommended Consults (None)  Oral Care Recommendations Oral care Q4 per protocol  Other Recommendations Order thickener from pharmacy     CHL IP FOLLOW UP RECOMMENDATIONS 08/22/2014  Follow up Recommendations Outpatient SLP     CHL IP FREQUENCY AND DURATION 08/22/2014  Speech Therapy Frequency (ACUTE ONLY) min 3x week  Treatment Duration 1 week         SLP Swallow Goals No flowsheet data found.  No flowsheet data found.    CHL IP  REASON FOR REFERRAL 08/22/2014  Reason for Referral Objectively evaluate swallowing function     CHL IP ORAL PHASE 08/22/2014  Lips (None)  Tongue (None)  Mucous membranes (None)  Nutritional status (None)  Other (None)  Oxygen therapy (None)  Oral Phase WFL  Oral - Pudding Teaspoon (None)  Oral - Pudding Cup (None)  Oral - Honey Teaspoon (None)  Oral - Honey Cup (None)  Oral - Honey Syringe (None)  Oral - Nectar Teaspoon (None)  Oral - Nectar Cup (None)  Oral - Nectar Straw (None)  Oral - Nectar Syringe (None)  Oral - Ice Chips (None)  Oral - Thin Teaspoon (None)  Oral - Thin Cup (None)  Oral - Thin Straw (None)  Oral - Thin Syringe (None)  Oral - Puree (None)  Oral - Mechanical Soft (None)  Oral - Regular (None)  Oral - Multi-consistency (None)  Oral - Pill (None)  Oral Phase - Comment (None)      CHL IP PHARYNGEAL PHASE 08/22/2014  Pharyngeal Phase Impaired  Pharyngeal - Pudding Teaspoon (None)  Penetration/Aspiration details (pudding teaspoon) (None)  Pharyngeal - Pudding Cup (None)  Penetration/Aspiration details (pudding cup) (None)  Pharyngeal - Honey Teaspoon (None)  Penetration/Aspiration details (honey teaspoon) (None)  Pharyngeal - Honey Cup (None)  Penetration/Aspiration details (honey cup) (None)  Pharyngeal - Honey Syringe (None)  Penetration/Aspiration details (honey syringe) (None)  Pharyngeal - Nectar Teaspoon (None)  Penetration/Aspiration details (nectar teaspoon) (None)  Pharyngeal - Nectar Cup (None)  Penetration/Aspiration details (nectar cup) (None)  Pharyngeal - Nectar Straw Reduced epiglottic inversion;Reduced anterior laryngeal mobility;Reduced laryngeal elevation  Penetration/Aspiration details (nectar straw) (None)  Pharyngeal - Nectar Syringe (None)  Penetration/Aspiration details (nectar syringe) (None)  Pharyngeal - Ice Chips (None)  Penetration/Aspiration details (ice chips) (None)  Pharyngeal - Thin Teaspoon (None)   Penetration/Aspiration details (thin teaspoon) (None)  Pharyngeal - Thin Cup (None)  Penetration/Aspiration details (thin cup) (None)  Pharyngeal - Thin Straw Reduced epiglottic inversion;Reduced anterior laryngeal mobility;Reduced laryngeal elevation;Reduced airway/laryngeal closure;Penetration/Aspiration after swallow;Penetration/Aspiration during swallow;Trace aspiration  Penetration/Aspiration details (thin straw) Material enters airway, passes BELOW cords without attempt by patient to eject out (silent aspiration)  Pharyngeal - Thin Syringe (None)  Penetration/Aspiration details (thin syringe') (None)  Pharyngeal - Puree Reduced epiglottic inversion;Reduced anterior laryngeal mobility;Reduced laryngeal elevation  Penetration/Aspiration details (puree) (None)  Pharyngeal - Mechanical Soft (None)  Penetration/Aspiration details (mechanical soft) (None)  Pharyngeal - Regular (None)  Penetration/Aspiration details (regular) (None)  Pharyngeal - Multi-consistency (None)  Penetration/Aspiration details (multi-consistency) (None)  Pharyngeal - Pill (None)  Penetration/Aspiration details (pill) (None)  Pharyngeal Comment (None)     No flowsheet data found.  No flowsheet data found.        Dionisios Ricci L. Tivis Ringer, [place] CCC/SLP Pager [phone]  Shelby Larson 08/22/2014, 3:43 PM

## 2014-08-22 NOTE — Progress Notes (Addendum)
INITIAL NUTRITION ASSESSMENT  DOCUMENTATION CODES Per approved criteria  -Not Applicable   INTERVENTION: Initiate Jevity 1.2 @ 20 ml/hr via NG and increase by 10 ml every 4 hours to goal rate of 70 ml/hr.   30 ml Prostat 1x daily.    Tube feeding regimen provides 2015 kcal (100% of needs), 108 grams of protein, and 1355 ml of H2O.   NUTRITION DIAGNOSIS: Inadequate oral intake related to inability to eat as evidenced by NPO status  Goal: Pt to meet >/= 90% of their estimated nutrition needs   Monitor:  TF initiation, TF tolerance, diet advancement, weight, labs  Reason for Assessment: Consult for Tube feeding initiation and management  49 y.o. 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 Now s/p tracheotomy and tonsillectomy/resection left pharyngeal mass with left neck dissection  Spoke with patient and family/friend. Patient has been having episodes of hematemesis recently and has had trouble swallowing. She hasnt been eating any meals, rather just small snacks throughout the day. Per pt/friend this is how the pt usually eats and does not eat a lot when she is healthy either. She state her normal weight is 118-124.   She will receive a PEG tube for long term nutrition next week  Height: Ht Readings from Last 1 Encounters:  08/20/14 5\' 6"  (1.676 m)   Weight: Wt Readings from Last 1 Encounters:  08/20/14 131 lb 9.8 oz (59.7 kg)    Ideal Body Weight: 130  % Ideal Body Weight: 101%  Wt Readings from Last 10 Encounters:  08/20/14 131 lb 9.8 oz (59.7 kg)  08/08/14 121 lb (54.885 kg)  125 before surgery  Usual Body Weight: 118-124  % Usual Body Weight: 101%   BMI:  Body mass index is 21.25 kg/(m^2).  Estimated Nutritional Needs: Kcal: 1800-2100 Protein: > 90g Pro (1.5 g/kg) Fluid: 1.8-2 liters  Skin: Surgical incision neck  Diet Order: Diet NPO time  specified Except for: Ice Chips, Sips with Meds, Other (See Comments)  EDUCATION NEEDS: -No education needs identified at this time   Intake/Output Summary (Last 24 hours) at 08/22/14 0926 Last data filed at 08/22/14 0600  Gross per 24 hour  Intake   1850 ml  Output    774 ml  Net   1076 ml    Last BM: N/a  Labs:   Recent Labs Lab 08/20/14 0728 08/20/14 1446 08/21/14 0242  NA 135 137 135  K 3.1* 4.3 4.0  CL 98 101 96  CO2 25 19 26   BUN 10 9 5*  CREATININE 0.86 0.84 0.84  CALCIUM 9.5 8.5 7.9*  MG  --  1.3* 1.4*  PHOS  --  4.3 3.5  GLUCOSE 90 72 108*    CBG (last 3)  No results for input(s): GLUCAP in the last 72 hours.  Scheduled Meds: . sodium chloride   Intravenous Once  . sodium chloride   Intravenous Once  . bacitracin   Topical TID  . cyclobenzaprine  10 mg Oral TID  . gabapentin  600 mg Oral TID  . magnesium gluconate  500 mg Oral Daily  . pantoprazole (PROTONIX) IV  40 mg Intravenous QHS  . promethazine  12.5 mg Intravenous Once  . sertraline  100 mg Oral Daily    Continuous Infusions: . dextrose 5 % and 0.45 % NaCl with KCl 10 mEq/L 75 mL/hr at 08/20/14 2233  . feeding supplement (JEVITY  1.2 CAL)    . lactated ringers 10 mL/hr at 08/20/14 4627    Past Medical History  Diagnosis Date  . Seizures     takes Gabapentin  . Heart murmur     as a child  . Chronic bronchitis   . Anxiety     panic attacks  . Depression   . Headache     onset a few months ago  . Neuropathy     had it in both hands  . Arthritis   . Fibromyalgia   . Cancer     throat cancer  . Anemia   . Rosacea     Past Surgical History  Procedure Laterality Date  . Wisdom tooth extraction    . Tonsillectomy Left 08/20/2014    Procedure: TONSILLECTOMY;  Surgeon: Ruby Cola, MD;  [facility];  Service: ENT;  Laterality: Left;  . Radical neck dissection Left 08/20/2014    Procedure: RADICAL LEFT NECK DISSECTION ;  Surgeon: Ruby Cola, MD;  Location: [place];   Service: ENT;  Laterality: Left;  . Tracheostomy tube placement N/A 08/20/2014    Procedure: TRACHEOSTOMY - AWAKE;  Surgeon: Ruby Cola, MD;  Location: [place];  Service: ENT;  Laterality: N/A;    Burtis Junes RD, LDN Nutrition Pager: [phone] 08/22/2014 9:26 AM

## 2014-08-22 NOTE — Progress Notes (Signed)
NG slipped out of place, advanced back with Otilio Miu, RN. Abdl Xray ordered to verify placement.

## 2014-08-22 NOTE — Progress Notes (Signed)
ENT Progress Note: POD #2 s/p Procedure(s): TONSILLECTOMY RADICAL LEFT NECK DISSECTION  TRACHEOSTOMY - AWAKE   Subjective: C/O mod pain  Objective: Vital signs in last 24 hours: Temp:  [98.6 F (37 C)-99.8 F (37.7 C)] 98.6 F (37 C) (02/27 0410) Pulse Rate:  [85-116] 85 (02/27 0801) Resp:  [16-25] 16 (02/27 0410) BP: (124-151)/(80-95) 124/87 mmHg (02/27 0410) SpO2:  [92 %-99 %] 93 % (02/27 0410) FiO2 (%):  [28 %] 28 % (02/27 0808) Weight change:     Intake/Output from previous day: 02/26 0701 - 02/27 0700 In: 1850 [I.V.:1650; IV Piggyback:200] Out: 924 [Urine:909; Drains:15] Intake/Output this shift:    Labs:  Recent Labs  08/20/14 1446 08/21/14 0242  WBC 8.1 12.9*  HGB 8.8* 10.7*  HCT 26.8* 32.4*  PLT 228 210    Recent Labs  08/20/14 1446 08/21/14 0242  NA 137 135  K 4.3 4.0  CL 101 96  CO2 19 26  GLUCOSE 72 108*  BUN 9 5*  CALCIUM 8.5 7.9*    Studies/Results: Ct Abdomen Wo Contrast  08/21/2014   CLINICAL DATA:  Preop gastrostomy tube planning.  EXAM: CT ABDOMEN WITHOUT CONTRAST  TECHNIQUE: Multidetector CT imaging of the abdomen was performed following the standard protocol without IV contrast.  COMPARISON:  Abdomen 08/20/2014  FINDINGS: Consolidation or atelectasis in both lung bases. No pleural effusions. Enteric tube tip terminates in the distal esophagus.  The unenhanced appearance of the liver, spleen, gallbladder, pancreas, adrenal glands, abdominal aorta, inferior vena cava, and retroperitoneal lymph nodes is unremarkable. Small accessory spleen. 2 mm stone in the midportion right kidney. No hydronephrosis in either kidney. Stomach is gas-filled without abnormal distention or wall thickening. No evidence of colonic interposition anterior to the stomach. Small bowel are decompressed. Diffusely stool-filled colon. No free air or free fluid in the abdomen. Visualized abdominal wall musculature appears intact. Degenerative disc disease at L5-S1. No  destructive bone lesions.  IMPRESSION: Consolidation in both lung bases. Enteric tube tip terminates in the distal esophagus. Nonobstructing stone in the right kidney. No evidence of bowel obstruction or dilatation.   Electronically Signed   By: Lucienne Capers M.D.   On: 08/21/2014 18:51   Dg Chest Port 1 View  08/21/2014   CLINICAL DATA:  Tracheostomy evaluation.  EXAM: PORTABLE CHEST - 1 VIEW  COMPARISON:  08/20/2014.  FINDINGS: Tracheostomy good position. Low lung volumes with bibasilar subsegmental atelectasis and trace effusions. Nasogastric tube tip lies below the diaphragm along the greater curvature of the stomach. No pneumothorax.  IMPRESSION: Lung volumes are diminished compared with priors. No definite active infiltrates.   Electronically Signed   By: Rolla Flatten M.D.   On: 08/21/2014 07:44   Dg Chest Port 1 View  08/20/2014   CLINICAL DATA:  Tracheostomy placement  EXAM: PORTABLE CHEST - 1 VIEW  COMPARISON:  Chest radiograph June 30, 2014 and chest CT August 08, 2014  FINDINGS: Tracheostomy catheter tip is 5.6 cm above the carina. Nasogastric tube tip and side port are in the stomach. No pneumothorax. Lungs are clear. Heart size and pulmonary vascularity are normal. No adenopathy. No bone lesions.  IMPRESSION: Tracheostomy as described without pneumothorax.  Lungs clear.   Electronically Signed   By: Lowella Grip III M.D.   On: 08/20/2014 14:35   Dg Abd Portable 1v  08/22/2014   CLINICAL DATA:  NG tube placement.  EXAM: PORTABLE ABDOMEN - 1 VIEW  COMPARISON:  Abdominal radiograph earlier this day.  FINDINGS: Imaging focus  down the upper abdomen and lower chest for NG tube placement. The enteric tube has been advanced, side-port now just beyond the gastroesophageal junction. Endotracheal tube in place, 5.3 cm from the carina. Bibasilar opacities, may reflect atelectasis, pneumonia, or aspiration.  IMPRESSION: Tip and side port of the enteric tube below the diaphragm in the stomach.    Electronically Signed   By: Jeb Levering M.D.   On: 08/22/2014 00:00   Dg Abd Portable 1v  08/21/2014   CLINICAL DATA:  NG tube placement.  EXAM: PORTABLE ABDOMEN - 1 VIEW  COMPARISON:  08/20/2014  FINDINGS: Nonobstructive bowel gas pattern. Moderate stool throughout the colon. No organomegaly or suspicious calcification. NG tube tip is in the distal esophagus near the GE junction.  IMPRESSION: NG tube tip in the distal esophagus near the GE junction.   Electronically Signed   By: Rolm Baptise M.D.   On: 08/21/2014 18:54   Dg Abd Portable 1v  08/20/2014   CLINICAL DATA:  Nasogastric tube placement  EXAM: PORTABLE ABDOMEN - 1 VIEW  COMPARISON:  None.  FINDINGS: Nasogastric tube tip and side port are in the stomach. There is stool throughout the colon. There is an overall paucity of gas. No free air. Lung bases are clear.  IMPRESSION: Nasogastric tube tip and side port in stomach. Paucity of gas, a finding that may be normal but a finding that also may be indicative of enteritis or early ileus. No free air.   Electronically Signed   By: Lowella Grip III M.D.   On: 08/20/2014 14:33     PHYSICAL EXAM: Inc intact, no swelling or erythema JP decreasing - serous output Airway stable, trach intact   Assessment/Plan: Stable POD #2 Begin TF protocol as tol, pt for PEG tube Monday Monitor airway, ambulate off O2 and monitor    Caelyn Route 08/22/2014, 8:52 AM

## 2014-08-23 MED ORDER — VITAMIN B-1 100 MG PO TABS
100.0000 mg | ORAL_TABLET | Freq: Every day | ORAL | Status: DC
  Administered 2014-08-23 – 2014-08-28 (×6): 100 mg via ORAL
  Filled 2014-08-23 (×6): qty 1

## 2014-08-23 MED ORDER — LEVOFLOXACIN IN D5W 750 MG/150ML IV SOLN
750.0000 mg | Freq: Once | INTRAVENOUS | Status: AC
  Administered 2014-08-23: 750 mg via INTRAVENOUS
  Filled 2014-08-23: qty 150

## 2014-08-23 MED ORDER — ADULT MULTIVITAMIN W/MINERALS CH
1.0000 | ORAL_TABLET | Freq: Every day | ORAL | Status: DC
  Administered 2014-08-23 – 2014-08-28 (×6): 1 via ORAL
  Filled 2014-08-23 (×6): qty 1

## 2014-08-23 MED ORDER — LORAZEPAM 2 MG/ML IJ SOLN
2.0000 mg | INTRAMUSCULAR | Status: DC | PRN
  Administered 2014-08-23 – 2014-08-25 (×6): 2 mg via INTRAVENOUS
  Filled 2014-08-23 (×6): qty 1

## 2014-08-23 MED ORDER — HYDROCODONE-ACETAMINOPHEN 7.5-325 MG/15ML PO SOLN
10.0000 mL | ORAL | Status: DC | PRN
Start: 2014-08-23 — End: 2014-08-28
  Administered 2014-08-23: 10 mL via ORAL
  Administered 2014-08-24 – 2014-08-28 (×5): 15 mL via ORAL
  Filled 2014-08-23 (×6): qty 15

## 2014-08-23 MED ORDER — FOLIC ACID 1 MG PO TABS
1.0000 mg | ORAL_TABLET | Freq: Every day | ORAL | Status: DC
  Administered 2014-08-23 – 2014-08-28 (×6): 1 mg via ORAL
  Filled 2014-08-23 (×6): qty 1

## 2014-08-23 NOTE — Progress Notes (Signed)
4.47ml fentanyl PCA wasted with Clyda Greener

## 2014-08-23 NOTE — Progress Notes (Signed)
ENT Progress Note: POD #3 s/p Procedure(s): TONSILLECTOMY RADICAL LEFT NECK DISSECTION  TRACHEOSTOMY - AWAKE   Subjective: Pt stable  Objective: Vital signs in last 24 hours: Temp:  [98 F (36.7 C)-98.8 F (37.1 C)] 98.1 F (36.7 C) (02/28 0700) Pulse Rate:  [78-97] 87 (02/28 0331) Resp:  [11-26] 12 (02/28 0331) BP: (128-137)/(77-109) 128/77 mmHg (02/28 0331) SpO2:  [95 %-100 %] 99 % (02/28 0331) FiO2 (%):  [28 %] 28 % (02/28 0843) Weight:  [61.7 kg (136 lb 0.4 oz)] 61.7 kg (136 lb 0.4 oz) (02/28 0301) Weight change:  Last BM Date: 08/21/14  Intake/Output from previous day: 02/27 0701 - 02/28 0700 In: 2040 [I.V.:1800; NG/GT:240] Out: 415 [Urine:400; Drains:15] Intake/Output this shift:    Labs:  Recent Labs  08/20/14 1446 08/21/14 0242  WBC 8.1 12.9*  HGB 8.8* 10.7*  HCT 26.8* 32.4*  PLT 228 210    Recent Labs  08/20/14 1446 08/21/14 0242  NA 137 135  K 4.3 4.0  CL 101 96  CO2 19 26  GLUCOSE 72 108*  BUN 9 5*  CALCIUM 8.5 7.9*    Studies/Results: Ct Abdomen Wo Contrast  08/21/2014   CLINICAL DATA:  Preop gastrostomy tube planning.  EXAM: CT ABDOMEN WITHOUT CONTRAST  TECHNIQUE: Multidetector CT imaging of the abdomen was performed following the standard protocol without IV contrast.  COMPARISON:  Abdomen 08/20/2014  FINDINGS: Consolidation or atelectasis in both lung bases. No pleural effusions. Enteric tube tip terminates in the distal esophagus.  The unenhanced appearance of the liver, spleen, gallbladder, pancreas, adrenal glands, abdominal aorta, inferior vena cava, and retroperitoneal lymph nodes is unremarkable. Small accessory spleen. 2 mm stone in the midportion right kidney. No hydronephrosis in either kidney. Stomach is gas-filled without abnormal distention or wall thickening. No evidence of colonic interposition anterior to the stomach. Small bowel are decompressed. Diffusely stool-filled colon. No free air or free fluid in the abdomen.  Visualized abdominal wall musculature appears intact. Degenerative disc disease at L5-S1. No destructive bone lesions.  IMPRESSION: Consolidation in both lung bases. Enteric tube tip terminates in the distal esophagus. Nonobstructing stone in the right kidney. No evidence of bowel obstruction or dilatation.   Electronically Signed   By: Lucienne Capers M.D.   On: 08/21/2014 18:51   Dg Abd Portable 1v  08/22/2014   CLINICAL DATA:  NG tube placement.  EXAM: PORTABLE ABDOMEN - 1 VIEW  COMPARISON:  08/21/2014  FINDINGS: Enteric tube appears unchanged in position with tip localized over the upper stomach and proximal side hole just below the EG junction. Contrast material is present in the right colon. Stool-filled colon without abnormal distention. No small bowel distention.  IMPRESSION: Enteric tube tip appears to be in the upper stomach.   Electronically Signed   By: Lucienne Capers M.D.   On: 08/22/2014 21:02   Dg Abd Portable 1v  08/22/2014   CLINICAL DATA:  NG tube placement.  EXAM: PORTABLE ABDOMEN - 1 VIEW  COMPARISON:  Abdominal radiograph earlier this day.  FINDINGS: Imaging focus down the upper abdomen and lower chest for NG tube placement. The enteric tube has been advanced, side-port now just beyond the gastroesophageal junction. Endotracheal tube in place, 5.3 cm from the carina. Bibasilar opacities, may reflect atelectasis, pneumonia, or aspiration.  IMPRESSION: Tip and side port of the enteric tube below the diaphragm in the stomach.   Electronically Signed   By: Jeb Levering M.D.   On: 08/22/2014 00:00   Dg Abd  Portable 1v  08/21/2014   CLINICAL DATA:  NG tube placement.  EXAM: PORTABLE ABDOMEN - 1 VIEW  COMPARISON:  08/20/2014  FINDINGS: Nonobstructive bowel gas pattern. Moderate stool throughout the colon. No organomegaly or suspicious calcification. NG tube tip is in the distal esophagus near the GE junction.  IMPRESSION: NG tube tip in the distal esophagus near the GE junction.    Electronically Signed   By: Rolm Baptise M.D.   On: 08/21/2014 18:54   Dg Swallowing Func-speech Pathology  08/22/2014   Assunta Curtis, CCC-SLP     08/22/2014  3:45 PM  Objective Swallowing Evaluation: Modified Barium Swallowing Study   Patient Details  Name: Shelby Larson MRN: [id number] Date of Birth: [birth year]  Today's Date: 08/22/2014 Time: SLP Start Time (ACUTE ONLY): 1230-SLP Stop Time (ACUTE  ONLY): 1300 SLP Time Calculation (min) (ACUTE ONLY): 30 min  Past Medical History:  Past Medical History  Diagnosis Date  . Seizures     takes Gabapentin  . Heart murmur     as a child  . Chronic bronchitis   . Anxiety     panic attacks  . Depression   . Headache     onset a few months ago  . Neuropathy     had it in both hands  . Arthritis   . Fibromyalgia   . Cancer     throat cancer  . Anemia   . Rosacea    Past Surgical History:  Past Surgical History  Procedure Laterality Date  . Wisdom tooth extraction    . Tonsillectomy Left 08/20/2014    Procedure: TONSILLECTOMY;  Surgeon: Ruby Cola, MD;   Location[facility] OR;  Service: ENT;  Laterality: Left;  . Radical neck dissection Left 08/20/2014    Procedure: RADICAL LEFT NECK DISSECTION ;  Surgeon: Ruby Cola, MD;  Location: [place];  Service: ENT;  Laterality: Left;  . Tracheostomy tube placement N/A 08/20/2014    Procedure: TRACHEOSTOMY - AWAKE;  Surgeon: Ruby Cola, MD;   Location: [facility] OR;  Service: ENT;  Laterality: N/A;   HPI:  HPI: 49 y.o female presented to the ED on 2/13 with hemoptysis  and left neck mass.  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  Pt admitted 2/25 for tracheotomy,   biopsy left tongue base/oropharynx mass, left selective neck  dissection.  Has #6 cuffed trach.  Plan is for radiotherapy with  chemotherapy; PEG 2/29.    No Data Recorded  Assessment / Plan / Recommendation CHL IP CLINICAL IMPRESSIONS 08/22/2014  Dysphagia Diagnosis Moderate pharyngeal phase dysphagia  Clinical impression Pt  presents with a moderate dysphagia  attributable to lingual/pharyngeal mass, post-surgical effects,  and presence of open trach (unable to safely use PMV at this  time). Pt groggy for study, requiring frequent stimulation to  remain awake; when awake, she was anxious.  Notable was reduced  hyolaryngeal mobility and the appearance of reduced pharyngeal  space.  Repetitive swallows were required in order to propel  purees through pharynx and UES.  Trace aspiration of thin liquids  occurred during and after the swallow; pt without awareness of  aspirate.  Limited nectar-thick liquids were consumed without  penetration/aspiration.   Pt is scheduled for trach downsize and  PEG 2/29.  Smaller trach should allow better tolerance of PMV and  may allow more functional eating.  (Per ENT report, "true vocal  folds are visualized and are pushed to the patient's right but  are mobile with no obvious masses and intact adduction and  abduction bilaterally.")   For now, recommend continuing ice  chips, allowing purees and nectar-thick liquids per pt's request.   SLP will follow.  Pt will need OPSLP to address mobility of  swallowing musculature prior to onset of radiation treatment.       CHL IP TREATMENT RECOMMENDATION 08/22/2014  Treatment Plan Recommendations Therapy as outlined in treatment  plan below     CHL IP DIET RECOMMENDATION 08/22/2014  Diet Recommendations Ice chips PRN after oral care;Dysphagia 1  (Puree);Nectar-thick liquid  Liquid Administration via Straw;Cup  Medication Administration Crushed with puree  Compensations (None)  Postural Changes and/or Swallow Maneuvers (None)     CHL IP OTHER RECOMMENDATIONS 08/22/2014  Recommended Consults (None)  Oral Care Recommendations Oral care Q4 per protocol  Other Recommendations Order thickener from pharmacy     CHL IP FOLLOW UP RECOMMENDATIONS 08/22/2014  Follow up Recommendations Outpatient SLP     CHL IP FREQUENCY AND DURATION 08/22/2014  Speech Therapy Frequency (ACUTE ONLY)  min 3x week  Treatment Duration 1 week         SLP Swallow Goals No flowsheet data found.  No flowsheet data found.    CHL IP REASON FOR REFERRAL 08/22/2014  Reason for Referral Objectively evaluate swallowing function     CHL IP ORAL PHASE 08/22/2014  Lips (None) Tongue (None)  Mucous membranes (None)  Nutritional status (None)  Other (None)  Oxygen therapy (None)  Oral Phase WFL  Oral - Pudding Teaspoon (None)  Oral - Pudding Cup (None)  Oral - Honey Teaspoon (None)  Oral - Honey Cup (None)  Oral - Honey Syringe (None)  Oral - Nectar Teaspoon (None)  Oral - Nectar Cup (None)  Oral - Nectar Straw (None)  Oral - Nectar Syringe (None)  Oral - Ice Chips (None)  Oral - Thin Teaspoon (None)  Oral - Thin Cup (None)  Oral - Thin Straw (None)  Oral - Thin Syringe (None)  Oral - Puree (None)  Oral - Mechanical Soft (None)  Oral - Regular (None)  Oral - Multi-consistency (None)  Oral - Pill (None)  Oral Phase - Comment (None)      CHL IP PHARYNGEAL PHASE 08/22/2014  Pharyngeal Phase Impaired  Pharyngeal - Pudding Teaspoon (None)  Penetration/Aspiration details (pudding teaspoon) (None)  Pharyngeal - Pudding Cup (None)  Penetration/Aspiration details (pudding cup) (None)  Pharyngeal - Honey Teaspoon (None)  Penetration/Aspiration details (honey teaspoon) (None)  Pharyngeal - Honey Cup (None)  Penetration/Aspiration details (honey cup) (None)  Pharyngeal - Honey Syringe (None)  Penetration/Aspiration details (honey syringe) (None)  Pharyngeal - Nectar Teaspoon (None)  Penetration/Aspiration details (nectar teaspoon) (None)  Pharyngeal - Nectar Cup (None)  Penetration/Aspiration details (nectar cup) (None)  Pharyngeal - Nectar Straw Reduced epiglottic inversion;Reduced  anterior laryngeal mobility;Reduced laryngeal elevation  Penetration/Aspiration details (nectar straw) (None)  Pharyngeal - Nectar Syringe (None)  Penetration/Aspiration details (nectar syringe) (None)  Pharyngeal - Ice Chips (None)  Penetration/Aspiration details  (ice chips) (None)  Pharyngeal - Thin Teaspoon (None)  Penetration/Aspiration details (thin teaspoon) (None)  Pharyngeal - Thin Cup (None)  Penetration/Aspiration details (thin cup) (None)  Pharyngeal - Thin Straw Reduced epiglottic inversion;Reduced  anterior laryngeal mobility;Reduced laryngeal elevation;Reduced  airway/laryngeal closure;Penetration/Aspiration after  swallow;Penetration/Aspiration during swallow;Trace aspiration  Penetration/Aspiration details (thin straw) Material enters  airway, passes BELOW cords without attempt by patient to eject  out (silent aspiration)  Pharyngeal - Thin Syringe (None)  Penetration/Aspiration details (thin syringe') (None)  Pharyngeal - Puree Reduced epiglottic inversion;Reduced anterior  laryngeal mobility;Reduced laryngeal elevation  Penetration/Aspiration details (puree) (None)  Pharyngeal - Mechanical Soft (None)  Penetration/Aspiration details (mechanical soft) (None)  Pharyngeal - Regular (None)  Penetration/Aspiration details (regular) (None)  Pharyngeal - Multi-consistency (None)  Penetration/Aspiration details (multi-consistency) (None)  Pharyngeal - Pill (None)  Penetration/Aspiration details (pill) (None)  Pharyngeal Comment (None)     No flowsheet data found.  No flowsheet data found.        Amanda L. Tivis Ringer, [place] CCC/SLP Pager [phone]  Juan Quam Laurice 08/22/2014, 3:43 PM      PHYSICAL EXAM: Inc intact, no erythema or swelling  JP output stable Trach in-place, mod secretions   Assessment/Plan: Pt for PEG 2/29 NG dislodged o/n, po as tolerated. meds po as tolerated. Stable    Chariah Bailey 08/23/2014, 9:56 AM

## 2014-08-24 ENCOUNTER — Inpatient Hospital Stay (HOSPITAL_COMMUNITY): Payer: 59

## 2014-08-24 LAB — CBC
HEMATOCRIT: 32.8 % — AB (ref 36.0–46.0)
Hemoglobin: 10.7 g/dL — ABNORMAL LOW (ref 12.0–15.0)
MCH: 29.6 pg (ref 26.0–34.0)
MCHC: 32.6 g/dL (ref 30.0–36.0)
MCV: 90.9 fL (ref 78.0–100.0)
Platelets: 217 10*3/uL (ref 150–400)
RBC: 3.61 MIL/uL — AB (ref 3.87–5.11)
RDW: 15.8 % — ABNORMAL HIGH (ref 11.5–15.5)
WBC: 6.9 10*3/uL (ref 4.0–10.5)

## 2014-08-24 LAB — HEMOGLOBIN AND HEMATOCRIT, BLOOD
HCT: 36.4 % (ref 36.0–46.0)
Hemoglobin: 12 g/dL (ref 12.0–15.0)

## 2014-08-24 MED ORDER — FENTANYL CITRATE 0.05 MG/ML IJ SOLN
INTRAMUSCULAR | Status: DC | PRN
  Administered 2014-08-24 (×2): 25 ug via INTRAVENOUS
  Administered 2014-08-24: 50 ug via INTRAVENOUS

## 2014-08-24 MED ORDER — FENTANYL CITRATE 0.05 MG/ML IJ SOLN
INTRAMUSCULAR | Status: AC
  Filled 2014-08-24: qty 2

## 2014-08-24 MED ORDER — IOHEXOL 300 MG/ML  SOLN
50.0000 mL | Freq: Once | INTRAMUSCULAR | Status: AC | PRN
  Administered 2014-08-24: 15 mL via INTRAVENOUS

## 2014-08-24 MED ORDER — VANCOMYCIN HCL IN DEXTROSE 1-5 GM/200ML-% IV SOLN
1000.0000 mg | INTRAVENOUS | Status: AC
  Administered 2014-08-24: 1000 mg via INTRAVENOUS
  Filled 2014-08-24: qty 200

## 2014-08-24 MED ORDER — LIDOCAINE HCL 1 % IJ SOLN
INTRAMUSCULAR | Status: AC
Start: 2014-08-24 — End: 2014-08-24
  Filled 2014-08-24: qty 20

## 2014-08-24 MED ORDER — GLUCAGON HCL RDNA (DIAGNOSTIC) 1 MG IJ SOLR
INTRAMUSCULAR | Status: AC
  Filled 2014-08-24: qty 1

## 2014-08-24 MED ORDER — MIDAZOLAM HCL 2 MG/2ML IJ SOLN
INTRAMUSCULAR | Status: AC
  Filled 2014-08-24: qty 2

## 2014-08-24 MED ORDER — MIDAZOLAM HCL 2 MG/2ML IJ SOLN
INTRAMUSCULAR | Status: DC | PRN
  Administered 2014-08-24 (×3): 0.5 mg via INTRAVENOUS

## 2014-08-24 MED ORDER — GLUCAGON HCL (RDNA) 1 MG IJ SOLR
INTRAMUSCULAR | Status: DC | PRN
  Administered 2014-08-24: .5 mg via INTRAVENOUS

## 2014-08-24 NOTE — Progress Notes (Signed)
Subjective: POD#4 from awake tracheotomy, left neck dissection, biopsy oropharyngeal mass. Less confusion, got PEG in VIR this morning.  Objective: Vital signs in last 24 hours: Temp:  [97.9 F (36.6 C)-100.2 F (37.9 C)] 97.9 F (36.6 C) (02/29 1131) Pulse Rate:  [76-111] 81 (02/29 1115) Resp:  [16-39] 19 (02/29 1115) BP: (122-169)/(77-108) 147/91 mmHg (02/29 1115) SpO2:  [94 %-100 %] 100 % (02/29 1115) FiO2 (%):  [28 %] 28 % (02/29 1115) Weight:  [58.9 kg (129 lb 13.6 oz)] 58.9 kg (129 lb 13.6 oz) (02/29 0345)  Awake, alert, trach secure, neck supple, minimal JP drainage so removed JP. Left neck incision clean, dry, and intact.  Procedure: tracheostomy tube change: removed silk sutures and velcro ties, removed old 6 cuffed trach, cut Bjork vicryl suture, and suctioned widely patent tracheostomy. Placed new 4 cuffless shiley trach and secured with velcro trach ties. Patient tolerated well.  @LABLAST2 (wbc:2,hgb:2,hct:2,plt:2) No results for input(s): NA, K, CL, CO2, GLUCOSE, BUN, CREATININE, CALCIUM in the last 72 hours.  Medications: Scheduled Meds: . sodium chloride   Intravenous Once  . sodium chloride   Intravenous Once  . bacitracin   Topical TID  . cyclobenzaprine  10 mg Oral TID  . feeding supplement (PRO-STAT SUGAR FREE 64)  30 mL Per Tube Daily  . fentaNYL      . folic acid  1 mg Oral Daily  . gabapentin  600 mg Oral TID  . glucagon (human recombinant)      . lidocaine      . magnesium gluconate  500 mg Oral Daily  . midazolam      . multivitamin with minerals  1 tablet Oral Daily  . pantoprazole (PROTONIX) IV  40 mg Intravenous QHS  . promethazine  12.5 mg Intravenous Once  . sertraline  100 mg Oral Daily  . thiamine  100 mg Oral Daily   Continuous Infusions: . dextrose 5 % and 0.45 % NaCl with KCl 10 mEq/L 75 mL/hr at 08/23/14 1851  . feeding supplement (JEVITY 1.2 CAL) Stopped (08/23/14 0830)  . 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 Can go home once tolerating tube feeds, DTs resolved, and home health set up. Rx on chart. Will arrange outpatient referral to [facility] for radiation and chemotherapy. Family updated.   LOS: 4 days   Ruby Cola 08/24/2014, 2:56 PM

## 2014-08-24 NOTE — Progress Notes (Signed)
Pt tx [facility] per MD order, pt VSS, pt report called to receiving RN, all questions answered, family notified, family verbalized understanding of tx

## 2014-08-24 NOTE — Progress Notes (Signed)
Spoke with patients mother Naomee Nowland (emergency contact) she request that we share the patients information with Enslie Sahota who is the patients sister. Contact information updated in the chart.

## 2014-08-24 NOTE — Progress Notes (Signed)
SLP Cancellation Note  Patient Details Name: Shelby Larson MRN: [id number] DOB: 1965-07-13   Cancelled treatment:         Attempted to see pt for PMV tx.  Pt is currently gone for PEG placement.  Will return 3/1.  Quinn Axe T 08/24/2014, 10:51 AM

## 2014-08-24 NOTE — Progress Notes (Signed)
Spoke with nurse about supplying ambu bag,  suction cath and cont pulse ox

## 2014-08-25 MED ORDER — JEVITY 1.2 CAL PO LIQD
480.0000 mL | Freq: Three times a day (TID) | ORAL | Status: DC
  Administered 2014-08-25 – 2014-08-27 (×7): 480 mL
  Filled 2014-08-25 (×5): qty 711

## 2014-08-25 MED ORDER — RESOURCE THICKENUP CLEAR PO POWD
ORAL | Status: DC | PRN
  Filled 2014-08-25: qty 125

## 2014-08-25 NOTE — Progress Notes (Signed)
Thank you for consult on Shelby Larson. Chart reviewed and patient examined. Mother, friend and minister in room during exam. Patient remains confused and has poor insight and awareness of ongoing issues. Mother has neuropathy, uses walker, does not drive and does not feel that she can provide assistance with PEG or trach care. She would prefer rehab at [facility] as this is closer to home. Will defer CIR consult for now.

## 2014-08-25 NOTE — Progress Notes (Signed)
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 Got PEG yesterday, had trach changed to 4 cuffless, doing well, much more oriented.  Objective: Vital signs in last 24 hours: Temp:  [97.9 F (36.6 C)-99.6 F (37.6 C)] 99.6 F (37.6 C) (03/01 0415) Pulse Rate:  [76-111] 81 (03/01 0421) Resp:  [16-39] 20 (03/01 0421) BP: (122-169)/(83-108) 145/83 mmHg (03/01 0415) SpO2:  [93 %-100 %] 95 % (03/01 0421) FiO2 (%):  [28 %] 28 % (03/01 0421) Weight:  [61.2 kg (134 lb 14.7 oz)] 61.2 kg (134 lb 14.7 oz) (03/01 0415)  4 cuffless shiley trach in place and secure with velcro ties, left neck incision healing well with absorbable sutures and neck soft and flat, able to voice with finger occlusion of trach, CN 7, 9, 10, 11, 12 intact and symmetric BL.  @LABLAST2 (wbc:2,hgb:2,hct:2,plt:2) No results for input(s): NA, K, CL, CO2, GLUCOSE, BUN, CREATININE, CALCIUM in the last 72 hours.  Medications:  Scheduled Meds: . sodium chloride   Intravenous Once  . sodium chloride   Intravenous Once  . bacitracin   Topical TID  . cyclobenzaprine  10 mg Oral TID  . feeding supplement (JEVITY 1.2 CAL)  480 mL Per Tube 3 times per day  . feeding supplement (PRO-STAT SUGAR FREE 64)  30 mL Per Tube Daily  . folic acid  1 mg Oral Daily  . gabapentin  600 mg Oral TID  . magnesium gluconate  500 mg Oral Daily  . multivitamin with minerals  1 tablet Oral Daily  . pantoprazole (PROTONIX) IV  40 mg Intravenous QHS  . promethazine  12.5 mg Intravenous Once  . sertraline  100 mg Oral Daily  . thiamine  100 mg Oral Daily   Continuous Infusions: . dextrose 5 % and 0.45 % NaCl with KCl 10 mEq/L 75 mL/hr at 08/25/14 0042  . lactated ringers 10 mL/hr at 08/20/14 0813   PRN Meds:.acetaminophen (TYLENOL) oral liquid 160 mg/5 mL, acetaminophen-codeine, diphenhydrAMINE **OR** diphenhydrAMINE, docusate, glucagon,  HYDROcodone-acetaminophen, HYDROmorphone (DILAUDID) injection, LORazepam, methocarbamol, naphazoline, nicotine, ondansetron, pneumococcal 23 valent vaccine, QUEtiapine, RESOURCE THICKENUP CLEAR  Assessment/Plan: Doing well POD# 5 from trach, left SND, biopsy oropharynx mass for T4aN2bM0 SCC left oropharynx. Patient can go home once home health and trach/PEG supplies set up and tolerating tube feeds, possibly later today. Rx on chart. Will set up outpatient referrals for medical and radiation oncology.   LOS: 5 days   Ruby Cola 08/25/2014, 7:04 AM

## 2014-08-25 NOTE — Discharge Summary (Addendum)
   Date of Admission: 08/20/2014 Date of Discharge:08/28/2014  Discharge MD: Ruby Cola, MD Admitting MD: Ruby Cola, MD  Reason for admission/final discharge diagnosis: [phone] left tongue base/pharynx squamous cell carcinoma  Labs: see EPIC, H/H improved from ~8/24 to 10 to 12/30-36 after 3 units PRBCs  Procedure(s) performed: 08/20/14 awake tracheotomy, left selective neck dissection, biopsy pharynx mass; 08/24/14 VIR PEG placed  Discharge Condition: improved/stable  Discharge Exam: 6 cuffless SHiley trach in place and secure, CN 2-12 intact and symmetric, left neck incision healing well with absorbable sutures  Discharge Instructions: follow up with  Dr. Simeon [facility] in  2 weeks, oncology appointment is at [facility] on 09/09/14, 2 cans of Jevity 1.2 cal TID per PEG as tolerated, flush tube with 49mL after each feeding, pureed or  foods with nectar thickened or thin liquids as tolerated. Suction trach as needed. Give patient spare velcro trach ties and spare 6 and 4 cuffless shiley trachs to take home. OTC neosporin to left neck incision TID, sutures are absorbable. Up ad lib. Rx on chart for tylenol with codeine liquid, zofran PRN.  Discharge Medications: Tylenol with codeine 120/12mg  per 15mL: 53mL PO or per tube Q4 hours PRN pain Zofran 4mg  ODT: 4mg  PO Q4 hours PRN nausea Acetaminophen 160mg /21mL liquid: 20mL PO or per tube Q4 hours PRN pain or fever >38.5 centigrade Jevity 1.2cal or equivalent: 438mL per tube TID as tolerated, flush tube with 75mL sterile water after each feeding Bacitracin ointment: apply thin layer to left neck incision TID x 3 weeks total.  Prior to admission medications to resume:  (CHLORASEPTIC SORE THROAT PO) Take 2 sprays by mouth daily as needed (sore throat).   Yes Historical Provider, MD  cyclobenzaprine (FLEXERIL) 10 MG tablet Take 10 mg by mouth 3 (three) times daily.   Yes Historical Provider, MD  fluticasone  (FLONASE) 50 MCG/ACT nasal spray Place 2 sprays into both nostrils daily.   Yes Historical Provider, MD  gabapentin (NEURONTIN) 600 MG tablet Take 600 mg by mouth 3 (three) times daily.   Yes Historical Provider, MD  MAGNESIUM PO Take 2 tablets by mouth daily.   Yes Historical Provider, MD  Multiple Vitamin (MULTIVITAMIN WITH MINERALS) TABS tablet Take 1 tablet by mouth daily.   Yes Historical Provider, MD  naproxen sodium (ANAPROX) 220 MG tablet Take 440 mg by mouth daily as needed (pain).   Yes Historical Provider, MD  QUEtiapine (SEROQUEL) 50 MG tablet Take 50 mg by mouth daily as needed (sleep).   Yes Historical Provider, MD  tetrahydrozoline-zinc (VISINE-AC) 0.05-0.25 % ophthalmic solution Place 2 drops into both eyes daily as needed (dry eyes).   Yes Historical Provider, MD         lidocaine (XYLOCAINE) 2 % solution Use as directed 20 mLs in the mouth or throat as needed for mouth pain. 08/08/14   Carmin Muskrat, MD         Hospital Course: taken to OR 08/20/14 for awake trach, left SND, and biopsy pharynx mass. Did well post-op, had drain removed and trach downsized POD#4. #6 cuffless shiley placed before discharge. Cleared for pureed or soft foods with nectar thick or thin liquids as tolerated and had PEG placed 08/24/14. Accepted to SNF and discharged POD#8 08/28/14.  Ruby Cola  08/28/2014  12:09 PM

## 2014-08-25 NOTE — Progress Notes (Signed)
Speech Language Pathology Treatment: Dysphagia;Passy Muir Speaking valve  Patient Details Name: Shelby Larson MRN: [id number] DOB: May 21, 1966 Today's Date: 08/25/2014 Time: 1100-1145 SLP Time Calculation (min) (ACUTE ONLY): 45 min  Assessment / Plan / Recommendation Clinical Impression  F/u for dysphagia, PMV treatment.  Pt's trach downsized to #4 cuffless; today, she presents with excellent toleration of valve with + upper airway patency, Sp02 >98% during 45 minute use of valve. Phonation is mildly harsh, required min cues to increase volume and inhale prior to speaking.  Attempted to teach pt how to place and remove valve, but persisting confusion prohibited her from even identifying the location of her trach when in front of a mirror.    Consumed limited amounts of purees with repetitive swallows needed to transition tspn-sized boluses of pudding.  Recommend continuing to allow nectar-thick liquids, purees, ice chips as requested by pt.  Suspect liberalization of diet will be likely prior to D/C.  Re: disposition - pt with difficulty following instructions and basic commands during session.  Demonstrates improved short-term memory, but problem-solving abilities and insight remain impaired, raising concerns for her safety at home.  D/W RN.  Continue SLP.        HPI HPI: 49 y.o female presented to the ED on 2/13 with hemoptysis and left neck mass.  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  Pt admitted 2/25 for tracheotomy,  biopsy left tongue base/oropharynx mass, left selective neck dissection.  Has #6 cuffed trach.  Plan is for radiotherapy with chemotherapy; PEG 2/29.     Pertinent Vitals  Sp02 >98%, HR 98 during valve use.   SLP Plan  Continue with current plan of care;Goals updated    Recommendations Diet recommendations: Other(comment) (nectar-thick liquids, dys 1 per request) Liquids provided via: Cup Medication Administration: Crushed with  puree Supervision: Patient able to self feed Compensations: Clear throat intermittently;Slow rate;Small sips/bites Postural Changes and/or Swallow Maneuvers: Seated upright 90 degrees      Patient may use Passy-Muir Speech Valve: with SLP only;Intermittently with supervision;During PO intake/meals PMSV Supervision: Full       Oral Care Recommendations: Oral care Q4 per protocol Follow up Recommendations: Skilled Nursing facility (then OP for SLP specialty in head and neck CA) Plan: Continue with current plan of care;Goals updated   Shelby Larson, [place] CCC/SLP Pager [phone]      Shelby Larson 08/25/2014, 11:59 AM

## 2014-08-25 NOTE — Progress Notes (Signed)
S/p placement of 14 Fr pigtail gastrostomy catheter yesterday Mildly sore at sight BP 127/85 mmHg  Pulse 91  Temp(Src) 98.1 F (36.7 C) (Oral)  Resp 18  Ht 5\' 6"  (1.676 m)  Wt 134 lb 14.7 oz (61.2 kg)  BMI 21.79 kg/m2  SpO2 97%  LMP  Tube intact, site clean.  S/p 14Fr pigtail g-tube Ok to begin use for TF today T-tacs can be removed in 1-2 weeks. Tube can be exchanged/upsize to a more traditional 20Fr G-tube in about 6 weeks. Call IR with issues.  Ascencion Dike PA-C Interventional Radiology 08/25/2014 10:49 AM

## 2014-08-25 NOTE — Care Management Note (Signed)
  Page 2 of 2   08/25/2014     10:24:15 AM CARE MANAGEMENT NOTE 08/25/2014  Patient:  Shelby Larson, Shelby Larson   Account Number:  [id number]  Date Initiated:  08/24/2014  Documentation initiated by:  Shelby Larson  Subjective/Objective Assessment:   Pt admitted s/p neck resection and new trach     Action/Plan:   PTA Pt lived at home- will need home trach supplies and [facility]   Anticipated DC Date:  08/26/2014   Anticipated DC Plan:  SKILLED NURSING FACILITY  In-house referral  Clinical Social Worker         Choice offered to / List presented to:             Status of service:  In process, will continue to follow Medicare Important Message given?   (If response is "NO", the following Medicare IM given date fields will be blank) Date Medicare IM given:   Medicare IM given by:   Date Additional Medicare IM given:   Additional Medicare IM given by:    Discharge Disposition:    Per UR Regulation:  Reviewed for med. necessity/level of care/duration of stay  If discussed at [place] of Stay Meetings, dates discussed:   08/25/2014    Comments:  08-25-14 Consult for home health needs.  Patient currently confused . Spoke to Patient's sister Shelby Larson who is a Government social research officer in [place] work phone [phone] ext [phone] or cell phone [phone] . Shelby Larson states patient lives with their 68 year old mother who uses a walker and their mother cannot care for trach and PEG . According to Shelby Larson their is no one else to assist patient at home.  Shelby Larson asking if patient can go to short term rehab at discharge.  Shelby Larson aware insurance would have to approve rehab . Explained will ask Shelby Larson for PT/OT /SW consults .  Patient goes by Shelby Larson .  Spoke with Shelby Larson at Shelby Larson office , she will discuss with Shelby Larson and call NCM back.  Shelby Spatz RN BSN [phone]    08/24/14- 49- Shelby Gibbons RN, BSN [phone] Per nursing staff pt on CIWA and slightly confused this AM- still in restraints- started with DTs this  weekend- no family at bedside- plan to downsize trach to #4 later this afternoon, pt for PEG tube placement today- pt will need Trach supplies and HH at dischare- NCM to follow and speak with pt when more alert. Spoke with Shelby Larson with [facility] regarding trach potential trach needs.

## 2014-08-25 NOTE — Clinical Social Work Note (Signed)
CSW received consult for SNF rehab, CSW did not have time to assess patient will complete tomorrow morning.  Jones Broom. [place], MSW, [place] 08/25/2014 4:52 PM

## 2014-08-26 MED ORDER — JEVITY 1.2 CAL PO LIQD
ORAL | Status: AC
  Administered 2014-08-26: 480 mL
  Filled 2014-08-26: qty 237

## 2014-08-26 NOTE — Evaluation (Addendum)
Occupational Therapy Evaluation Patient Details Name: Shelby Larson MRN: [id number] DOB: Aug 29, 1965 Today's Date: 08/26/2014    History of Present Illness 49 year old who presented to ER on 08/08/14 with hemoptysis and left neck mass. CT neck showed large left pharyngeal mass and multiple left neck necrotic nodes. Dx of metastatic squamous cell carcinoma. Pt underwent tracheotomy and tonsillectomy/resection left pharyngeal mass with left neck dissection   Clinical Impression   Pt demonstrates decline in function and safety with ADLs and ADL mobility with decreased strength, balance, endurance and safety awareness. Pt would benefit from acute OT services to address impairments to increase level of function and safety. Pt and family planning for her to d/c to SNF in [place] closer to her home for skilled care and rehab    Follow Up Recommendations  SNF;Supervision/Assistance - 24 hour    Equipment Recommendations  Other (comment);None recommended by OT (TBD at next level of care)    Recommendations for Other Services PT consult     Precautions / Restrictions Precautions Precautions: Other (comment) Precaution Comments: trach, Peg tube, fall risk Restrictions Weight Bearing Restrictions: No      Mobility Bed Mobility Overal bed mobility: Modified Independent                Transfers Overall transfer level: Needs assistance Equipment used: 1 person hand held assist Transfers: Sit to/from Stand Sit to Stand: Min assist         General transfer comment: min A due to tubes and lines, cues for safety due to pt trying to stoop down uder trach tube line. asking OT to unplug lines    Balance Overall balance assessment: Needs assistance Sitting-balance support: No upper extremity supported;Feet supported Sitting balance-Leahy Scale: Good     Standing balance support: Single extremity supported;No upper extremity supported;During functional activity Standing  balance-Leahy Scale: Fair                              ADL Overall ADL's : Needs assistance/impaired Eating/Feeding: NPO   Grooming: Wash/dry hands;Wash/dry face;Min guard;Standing   Upper Body Bathing: Minimal assitance   Lower Body Bathing: Moderate assistance   Upper Body Dressing : Minimal assistance   Lower Body Dressing: Moderate assistance   Toilet Transfer: Min guard;BSC;Ambulation;Cueing for safety   Toileting- Water quality scientist and Hygiene: Min guard;Sit to/from stand       Functional mobility during ADLs: Min guard;Cueing for safety General ADL Comments: assist due to decreasd balance, tubes and lines     Vision  wears glasses   Perception Perception Perception Tested?: No   Praxis Praxis Praxis tested?: Not tested    Pertinent Vitals/Pain Pain Assessment: 0-10 Pain Score: 8  Pain Location: R LE Pain Descriptors / Indicators: Aching;Throbbing;Radiating;Sore     Hand Dominance Right   Extremity/Trunk Assessment Upper Extremity Assessment Upper Extremity Assessment: Generalized weakness   Lower Extremity Assessment Lower Extremity Assessment: Defer to PT evaluation   Cervical / Trunk Assessment Cervical / Trunk Assessment: Normal   Communication Communication Communication: Tracheostomy   Cognition Arousal/Alertness: Awake/alert Behavior During Therapy: WFL for tasks assessed/performed;Impulsive Overall Cognitive Status: No family/caregiver present to determine baseline cognitive functioning (min A due to tubes and lines, cues for safety due to pt trying to stoop down uder trach tube line. asking OT to unplug lines)                     General Comments  pt pleasant and cooperative                 Home Living Family/patient expects to be discharged to:: Private residence Living Arrangements: Parent Available Help at Discharge: Family Type of Home: House Home Access: [place]: Two  level;Able to live on main level with bedroom/bathroom;Bed/bath upstairs     Bathroom Shower/Tub: Tub/shower unit;Walk-in shower   Bathroom Toilet: Handicapped height     Home Equipment: Toilet riser          Prior Functioning/Environment Level of Independence: Independent             OT Diagnosis: Generalized weakness;Acute pain   OT Problem List: Decreased strength;Decreased knowledge of use of DME or AE;Decreased safety awareness;Impaired balance (sitting and/or standing);Pain   OT Treatment/Interventions: Self-care/ADL training;Balance training;Therapeutic exercise;Therapeutic activities;DME and/or AE instruction;Patient/family education;Neuromuscular education    OT Goals(Current goals can be found in the care plan section) Acute Rehab OT Goals Patient Stated Goal: go home after rehab OT Goal Formulation: With patient Time For Goal Achievement: 09/02/14 Potential to Achieve Goals: Good ADL Goals Pt Will Perform Grooming: with supervision;with set-up;standing Pt Will Perform Upper Body Bathing: with min guard assist;with supervision;with set-up;sitting;standing Pt Will Perform Lower Body Bathing: with min assist Pt Will Perform Upper Body Dressing: with min guard assist;with supervision;with set-up;standing Pt Will Perform Lower Body Dressing: with min assist Pt Will Transfer to Toilet: with min guard assist;with supervision;bedside commode Pt Will Perform Toileting - Clothing Manipulation and hygiene: with min guard assist;sit to/from stand Additional ADL Goal #1: Min verbal cues for safety maneuvering with tubes and lines during bed mobility and transfers  OT Frequency: Min 2X/week   Barriers to D/C: Decreased caregiver support  pt and family planning for her d/c to SNF in [place] closer to her home for short term rehab                     End of Session Equipment Utilized During Treatment:  [facility])  Activity Tolerance: Patient tolerated treatment  well Patient left: in bed;with bed alarm set;with call bell/phone within reach   Time: 1251-1314 OT Time Calculation (min): 23 min Charges:  OT General Charges $OT Visit: 1 Procedure OT Evaluation $Initial OT Evaluation Tier I: 1 Procedure OT Treatments $Therapeutic Activity: 8-22 mins G-Codes:    Britt Bottom 08/26/2014, 1:37 PM

## 2014-08-26 NOTE — Progress Notes (Addendum)
NUTRITION FOLLOW UP  Intervention:   -Continue 480 ml of Jevity 1.2 @ 120 ml/hr every 8 hours  -Continue 30 ml Prostat 1x daily.   Tube feeding regimen provides 1828 kcal (96% of needs), 95 grams of protein, and 1162 ml of H2O.   Nutrition Dx:   Inadequate oral intake related to inability to eat as evidenced by NPO status; ongoing  Goal:   Pt to meet >/= 90% of their estimated nutrition needs; met  Monitor:   TF tolerance, diet advancement, weight, labs  Assessment:   49 year old with large left pharyngeal mass and multiple left neck necrotic nodes. Now s/p tracheotomy and tonsillectomy/resection left pharyngeal mass with left neck dissection  s/p Procedure(s) on 08/20/14: TONSILLECTOMY RADICAL LEFT NECK DISSECTION  TRACHEOSTOMY - AWAKE  Pt on trach collar.  Pt s/p PEG placement on 08/24/14. JP drain removed on 08/24/14 due to minimal drainage.  SLP has been follow for dysphagia and PSMV treatments. Pt is still NPO, but able to consume minimal amounts of puree, nectar thick liquids, and ice chips with SLP supervision.  She continues to receive TF. Noted that formula and rate were adjusted by ENT. Pt is now receiving 480 ml of Jevity 1.2 @ 120 ml/hr 3 times daily, along with 30 ml Prostat daily. New regimen provides 1828 kcals, 95 grams protein, and 1162 ml fluid daily (which meets 96% of estimated minimum kcal needs and 100% of estimated minimum protein needs). She is tolerating TF well with minimal gastric residuals (10-40 ml).  Plan is to discharge to SNF once placement has been obtained.  Labs reviewed. BUN: 5, Calcium: 7.9, Mg: 1.4, Glucose: 108.   Height: Ht Readings from Last 1 Encounters:  08/20/14 '5\' 6"'  (1.676 m)    Weight Status:   Wt Readings from Last 1 Encounters:  08/26/14 130 lb 8.2 oz (59.2 kg)   08/20/14 131 lb 9.8 oz (59.7 kg)       Re-estimated needs:  Kcal: 1900-2100 Protein: 90-115 grams Fluid: 1.9-2.1 L  Skin: closed neck incision  Diet  Order:  NPO   Intake/Output Summary (Last 24 hours) at 08/26/14 1032 Last data filed at 08/26/14 4315  Gross per 24 hour  Intake 2387.25 ml  Output    300 ml  Net 2087.25 ml    Last BM: 08/24/14   Labs:   Recent Labs Lab 08/20/14 0728 08/20/14 1446 08/21/14 0242  NA 135 137 135  K 3.1* 4.3 4.0  CL 98 101 96  CO2 '25 19 26  ' BUN 10 9 5*  CREATININE 0.86 0.84 0.84  CALCIUM 9.5 8.5 7.9*  MG  --  1.3* 1.4*  PHOS  --  4.3 3.5  GLUCOSE 90 72 108*    CBG (last 3)  No results for input(s): GLUCAP in the last 72 hours.  Scheduled Meds: . sodium chloride   Intravenous Once  . sodium chloride   Intravenous Once  . bacitracin   Topical TID  . cyclobenzaprine  10 mg Oral TID  . feeding supplement (JEVITY 1.2 CAL)  480 mL Per Tube 3 times per day  . feeding supplement (PRO-STAT SUGAR FREE 64)  30 mL Per Tube Daily  . folic acid  1 mg Oral Daily  . gabapentin  600 mg Oral TID  . magnesium gluconate  500 mg Oral Daily  . multivitamin with minerals  1 tablet Oral Daily  . pantoprazole (PROTONIX) IV  40 mg Intravenous QHS  . promethazine  12.5 mg  Intravenous Once  . sertraline  100 mg Oral Daily  . thiamine  100 mg Oral Daily    Continuous Infusions: . dextrose 5 % and 0.45 % NaCl with KCl 10 mEq/L 75 mL/hr at 08/25/14 1758  . lactated ringers 10 mL/hr at 08/20/14 0813    Destany Severns A. Jimmye Norman, RD, LDN, CDE Pager: [phone] After hours Pager: [phone]

## 2014-08-26 NOTE — Clinical Social Work Placement (Addendum)
Clinical Social Work Department CLINICAL SOCIAL WORK PLACEMENT NOTE 08/26/2014  Patient:  Shelby Larson, Shelby Larson  Account Number:  [id number] Admit date:  08/20/2014  Clinical Social Worker:  ERIC ANTERHAUS, LCSWA  Date/time:  08/26/2014 01:23 PM  Clinical Social Work is seeking post-discharge placement for this patient at the following level of care:   [place]   (*CSW will update this form in Epic as items are completed)   08/26/2014  Patient/family provided with [place] Department of Clinical Social Work's list of facilities offering this level of care within the geographic area requested by the patient (or if unable, by the patient's family).  08/26/2014  Patient/family informed of their freedom to choose among providers that offer the needed level of care, that participate in Medicare, Medicaid or managed care program needed by the patient, have an available bed and are willing to accept the patient.  08/26/2014  Patient/family informed of [facility]' ownership interest in [facility], as well as of the fact that they are under no obligation to receive care at this facility.  PASARR submitted to EDS on 08/26/2014 PASARR number received on   FL2 transmitted to all facilities in geographic area requested by pt/family on  08/26/2014 FL2 transmitted to all facilities within larger geographic area on 08/26/2014  Patient informed that his/her managed care company has contracts with or will negotiate with  certain facilities, including the following:     Patient/family informed of bed offers received:  08/27/2014 Patient chooses bed at [facility]  Physician recommends and patient chooses bed at    Patient to be transferred to [facility]  on  08/28/2014 Patient to be transferred to facility by [facility]  Patient and family notified of transfer on 08/28/2014 Name of family member notified: Vito Backers   The following physician request were entered in  Epic: Physician Request  Please sign FL2.    Additional Comments: Physician please sign 30 day note for patient to go to SNF on chart   Eric R. Anterhaus, MSW, [place] 08/26/2014 1:25 PM

## 2014-08-26 NOTE — Progress Notes (Signed)
Speech Language Pathology Treatment: Dysphagia;Passy Muir Speaking valve  Patient Details Name: Shelby Larson MRN: [id number] DOB: 06-11-1966 Today's Date: 08/26/2014 Time: 1330-1409 SLP Time Calculation (min) (ACUTE ONLY): 39 min  Assessment / Plan / Recommendation Clinical Impression  Continued f/u for dysphagia, PMV:  Education with pt re: placement and removal of PMV.   Continues to require mod-max cues to understand instructions, despite repeated teaching with use of mirror and demonstration.  After ten min delay, when instructed to remove valve, pt attempted to disassemble trach collar.  Given persisting confusion, recommend continued full supervision with PMV for safety.  Pt continues to achieve low volume, harsh-quality phonation; mod cues for pacing of speech and to increase volume.  Primary nutrition now through PEG, with PO for pleasure/supplement. Pt eating limited amounts of purees and nectar-thick liquids.  Min cues required for safety, self-monitoring. Recommend repeat MBS prior to D/C to SNF.  Anticipate improved function given smaller, cuffless trach and ability to use PMV.    Have requested repeat MBS orders from Dr. Theressa Millard RN.   Continue SLP services.    HPI HPI: 49 y.o female presented to the ED on 2/13 with hemoptysis and left neck mass.  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  Pt admitted 2/25 for tracheotomy,  biopsy left tongue base/oropharynx mass, left selective neck dissection.  Has #6 cuffed trach.  Plan is for radiotherapy with chemotherapy; PEG 2/29.     Pertinent Vitals Pain Assessment: 0-10 Pain Score: 8  Pain Location: R LE Pain Descriptors / Indicators: Aching;Throbbing;Radiating;Sore  SLP Plan  Continue with current plan of care    Recommendations Diet recommendations:  (nectar-thick liquids, Dys 1 per request) Liquids provided via: Cup Medication Administration: Crushed with puree Supervision: Patient able to  self feed Compensations: Clear throat intermittently;Slow rate;Small sips/bites Postural Changes and/or Swallow Maneuvers: Seated upright 90 degrees      Patient may use Passy-Muir Speech Valve: with SLP only;Intermittently with supervision;During PO intake/meals PMSV Supervision: Full       Oral Care Recommendations: Oral care Q4 per protocol Follow up Recommendations: Skilled Nursing facility Plan: Continue with current plan of care   Shelby Larson, [place] CCC/SLP Pager [phone]      Juan Quam Laurice 08/26/2014, 2:15 PM

## 2014-08-26 NOTE — Progress Notes (Signed)
Subjective: POD#6 from awake trach, left SND, biopsy oropharynx mass for [id number] p16 negative left pharynx mass. Awaiting SNF placement, tube feeds held for high residuals but otherwise doing well.  Objective: Vital signs in last 24 hours: Temp:  [98.2 F (36.8 C)-99.5 F (37.5 C)] 98.4 F (36.9 C) (03/02 0610) Pulse Rate:  [80-102] 84 (03/02 1351) Resp:  [17-20] 18 (03/02 1351) BP: (130-145)/(82-92) 139/92 mmHg (03/02 0610) SpO2:  [94 %-100 %] 94 % (03/02 1351) FiO2 (%):  [28 %] 28 % (03/02 1351) Weight:  [59.2 kg (130 lb 8.2 oz)] 59.2 kg (130 lb 8.2 oz) (03/02 0610)  Neck supple, left neck absorbable sutures in place and incision clean dry and intact. 4 cuffed trach in place and secure with velcro ties.  @LABLAST2 (wbc:2,hgb:2,hct:2,plt:2) No results for input(s): NA, K, CL, CO2, GLUCOSE, BUN, CREATININE, CALCIUM in the last 72 hours.  Medications: Scheduled Meds: . sodium chloride   Intravenous Once  . sodium chloride   Intravenous Once  . bacitracin   Topical TID  . cyclobenzaprine  10 mg Oral TID  . feeding supplement (JEVITY 1.2 CAL)  480 mL Per Tube 3 times per day  . feeding supplement (PRO-STAT SUGAR FREE 64)  30 mL Per Tube Daily  . folic acid  1 mg Oral Daily  . gabapentin  600 mg Oral TID  . magnesium gluconate  500 mg Oral Daily  . multivitamin with minerals  1 tablet Oral Daily  . pantoprazole (PROTONIX) IV  40 mg Intravenous QHS  . promethazine  12.5 mg Intravenous Once  . sertraline  100 mg Oral Daily  . thiamine  100 mg Oral Daily   Continuous Infusions: . dextrose 5 % and 0.45 % NaCl with KCl 10 mEq/L 75 mL/hr at 08/25/14 1758  . lactated ringers 10 mL/hr at 08/20/14 0813   PRN Meds:.acetaminophen (TYLENOL) oral liquid 160 mg/5 mL, acetaminophen-codeine, diphenhydrAMINE **OR** diphenhydrAMINE, docusate, glucagon, HYDROcodone-acetaminophen, HYDROmorphone (DILAUDID) injection, LORazepam, methocarbamol, naphazoline, nicotine, ondansetron, pneumococcal 23  valent vaccine, QUEtiapine, RESOURCE THICKENUP CLEAR  Assessment/Plan: Doing well after biopsy pharynx/trach/left SND and PEG., awaiting SNF placement, will have my office set up referrals to medical and radiation oncology.   LOS: 6 days   Ruby Cola 08/26/2014, 3:24 PM

## 2014-08-26 NOTE — Evaluation (Signed)
Physical Therapy Evaluation Patient Details Name: Shelby Larson MRN: [id number] DOB: 26-Dec-1965 Today's Date: 08/26/2014   History of Present Illness  49 year old who presented to ER on 08/08/14 with hemoptysis and left neck mass. CT neck showed large left pharyngeal mass and multiple left neck necrotic nodes. Dx of metastatic squamous cell carcinoma. Pt underwent tracheotomy and tonsillectomy/resection left pharyngeal mass with left neck dissection    Clinical Impression  Patient presents with functional limitations due to deficits listed in PT problem list (see below). Pt with generalized weakness, balance deficits, ?cognitive deficits and impaired endurance impacting safe mobility. Requires constant Min A for balance during gait to prevent falls. Pt impulsive. Might benefit from AD next session. Pt not safe to return home at this time as pt lives with elderly mother who uses RW for mobility and may not be able to provide necessary care. Would benefit from [facility] SNF to improve overall mobility and safety awareness so pt can maximize independence prior to return home.    Follow Up Recommendations SNF;Supervision/Assistance - 24 hour    Equipment Recommendations  Other (comment) (TBD)    Recommendations for Other Services       Precautions / Restrictions Precautions Precautions: Other (comment);Fall Precaution Comments: trach, Peg tube Restrictions Weight Bearing Restrictions: No      Mobility  Bed Mobility Overal bed mobility: Modified Independent;Needs Assistance Bed Mobility: Supine to Sit;Sit to Supine     Supine to sit: Modified independent (Device/Increase time);HOB elevated Sit to supine: Modified independent (Device/Increase time);HOB elevated   General bed mobility comments: Impulsive getting to EOB. No use of rails.  Transfers Overall transfer level: Needs assistance Equipment used: None Transfers: Sit to/from Stand Sit to Stand: Min assist         General  transfer comment: Able to rise from EOB without assist however LOB upon standing requiring MIn A to maintain balance. Impulsive to get walking requiring cues to slow down to manage lines.  Ambulation/Gait Ambulation/Gait assistance: Min assist Ambulation Distance (Feet): 200 Feet Assistive device: None Gait Pattern/deviations: Step-through pattern;Decreased stride length;Staggering right;Drifts right/left   Gait velocity interpretation: Below normal speed for age/gender General Gait Details: Pt with unsteady gait pattern with leaning and staggering towards the  right. Requires repetition (verbal cues) and manual cues for direction. Ambulated on 28% Fi02. Sa02 mid 80s post ambulation bout, however pulse oximetry not reading correct.  Resolved quickly to low 90s with new sticker for pulse ox.  Stairs            Wheelchair Mobility    Modified Rankin (Stroke Patients Only)       Balance Overall balance assessment: Needs assistance Sitting-balance support: Feet supported;No upper extremity supported Sitting balance-Leahy Scale: Good     Standing balance support: During functional activity Standing balance-Leahy Scale: Fair                               Pertinent Vitals/Pain Pain Assessment: Faces Pain Score: 8  Faces Pain Scale: Hurts even more Pain Location: neck and back ("back hurts all the time.") Pain Descriptors / Indicators: Sore;Aching Pain Intervention(s): Monitored during session;Repositioned    Home Living Family/patient expects to be discharged to:: Private residence Living Arrangements: Parent Available Help at Discharge: Family Type of Home: House Home Access: Ramped entrance     Home Layout: Two level;Able to live on main level with bedroom/bathroom;Bed/bath upstairs Home Equipment: Toilet riser  Prior Function Level of Independence: Independent               Hand Dominance   Dominant Hand: Right    Extremity/Trunk  Assessment   Upper Extremity Assessment: Defer to OT evaluation           Lower Extremity Assessment: Generalized weakness      Cervical / Trunk Assessment: Normal  Communication   Communication: Tracheostomy  Cognition Arousal/Alertness: Awake/alert Behavior During Therapy: WFL for tasks assessed/performed;Impulsive Overall Cognitive Status: No family/caregiver present to determine baseline cognitive functioning Area of Impairment: Safety/judgement;Following commands       Following Commands: Follows multi-step commands inconsistently Safety/Judgement: Decreased awareness of safety;Decreased awareness of deficits     General Comments: Pt fidgeting with PEG tube and trach when therapist in room. Trying to dismantle trach.    General Comments General comments (skin integrity, edema, etc.): Pt with nonsensical speech at times - making comments unrelated to topic. Seems confused at times. Difficult to assess cognitive baseline as no family members present.    Exercises        Assessment/Plan    PT Assessment Patient needs continued PT services  PT Diagnosis Difficulty walking;Generalized weakness   PT Problem List Decreased strength;Cardiopulmonary status limiting activity;Decreased cognition;Decreased activity tolerance;Decreased balance;Decreased mobility;Decreased safety awareness  PT Treatment Interventions Balance training;DME instruction;Gait training;Functional mobility training;Therapeutic activities;Therapeutic exercise;Patient/family education   PT Goals (Current goals can be found in the Care Plan section) Acute Rehab PT Goals Patient Stated Goal: go home after rehab PT Goal Formulation: With patient Time For Goal Achievement: 09/09/14 Potential to Achieve Goals: Good    Frequency Min 2X/week   Barriers to discharge Decreased caregiver support Pt lives with mother who uses RW for mobility.    Co-evaluation               End of Session Equipment  Utilized During Treatment: Gait belt;Oxygen (trach collar ) Activity Tolerance: Patient limited by fatigue;Patient tolerated treatment well Patient left: in bed;with call bell/phone within reach;with bed alarm set;with nursing/sitter in room (Sitter present in room.) Nurse Communication: Mobility status         Time: 1950-9326 PT Time Calculation (min) (ACUTE ONLY): 20 min   Charges:   PT Evaluation $Initial PT Evaluation Tier I: 1 Procedure     PT G CodesCandy Sledge A 25-Sep-2014, 4:06 PM Candy Sledge, [place], DPT [phone]

## 2014-08-26 NOTE — Clinical Social Work Psychosocial (Signed)
Clinical Social Work Department BRIEF PSYCHOSOCIAL ASSESSMENT 08/26/2014  Patient:  Shelby Larson, Shelby Larson     Account Number:  [id number]     Admit date:  08/20/2014  Clinical Social Worker:  Dian Queen  Date/Time:  08/26/2014 01:14 PM  Referred by:  Physician  Date Referred:  08/25/2014 Referred for  SNF Placement   Other Referral:   Interview type:  Patient Other interview type:   family    PSYCHOSOCIAL DATA Living Status:  PARENTS Admitted from facility:   Level of care:   Primary support name:  Ermie Glendenning Primary support relationship to patient:  PARENT Degree of support available:   Patient lives with her elderly mother who uses a walker.    CURRENT CONCERNS Current Concerns  Post-Acute Placement   Other Concerns:    SOCIAL WORK ASSESSMENT / PLAN Patient is a54 year old female who lives with her mother. Patient has a sister who lives in [place], but will not be in town for a few weeks, but she is involved with her sister's care and is concerned for her well being.  Patient is alert and oriented x3 and currently has a trach, cancer, and is on a feeding tube.  Patient has history of drinking, depression, and some anxiety.  Patient is pleasant to talk to and understands that she needs to go to a SNF for short term rehab.  Patient has had many life changes happening at the same time which can affect how she is feeling.  Patient is in agreement to going to a SNF for short term rehab, and would prefer somewhere close to where she lives in [place]. Patient and her mother requested [facility] for SNF rehab. Patient will discharge to SNF once she is medically ready and discharge orders have been received.   Assessment/plan status:  Psychosocial Support/Ongoing Assessment of Needs Other assessment/ plan:   Information/referral to community resources:    PATIENT'S/FAMILY'S RESPONSE TO PLAN OF CARE: Patient and her family are in agreement to going to a SNF for short term  rehab.   Jones Broom. [place], MSW, [place] 08/26/2014 1:22 PM

## 2014-08-27 ENCOUNTER — Inpatient Hospital Stay (HOSPITAL_COMMUNITY): Payer: 59

## 2014-08-27 ENCOUNTER — Encounter: Payer: Self-pay | Admitting: *Deleted

## 2014-08-27 NOTE — Progress Notes (Signed)
Speech Language Pathology Treatment: Dysphagia;Passy Muir Speaking valve  Patient Details Name: Shelby Larson MRN: [id number] DOB: Jun 25, 1966 Today's Date: 08/27/2014 Time: 0349-1791 SLP Time Calculation (min) (ACUTE ONLY): 40 min  Assessment / Plan / Recommendation Clinical Impression  Session focused on education s/p MBS as well as PMV use.  Pt achieving better phonation and improved intelligibility/volume.  Instructed again re: placement and removal of PMV.  Pt able to safely place and remove valve independently today; MS is much improved.  Reviewed results and recs from [facility].  Pt is safe to begin Dysphagia 3 with thin liquids.  Discussed precautions; pt able to execute at mod I level.  Consumed thin liquids with valve in place without difficulty.  Discussed necessity of locating SLP with expertise in treating dysphagia associated with head and neck cancer; pt verbalized understanding.     HPI HPI: 49 y.o female presented to the ED on 2/13 with hemoptysis and left neck mass.  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  Pt admitted 2/25 for tracheotomy,  biopsy left tongue base/oropharynx mass, left selective neck dissection.  Has #6 cuffed trach.  Plan is for radiotherapy with chemotherapy; PEG 2/29.   Initial MBS 2/27 moderate dysphagia attributable to lingual/pharyngeal mass, post-surgical effects, and presence of open trach (unable to safely use PMV at that time).  Pt demonstrated silent aspiration of thin liquids and poor propulsion of solids through pharynx.  Now with #6 cuffless trach; tolerating PMV.     Pertinent Vitals    SLP Plan  Continue with current plan of care    Recommendations Diet recommendations: Dysphagia 3 (mechanical soft);Thin liquid Liquids provided via: Straw Medication Administration: Via alternative means Supervision: Patient able to self feed Compensations: Clear throat intermittently;Slow rate;Small sips/bites Postural Changes  and/or Swallow Maneuvers: Seated upright 90 degrees   Must use PMV during all PO intake.    Patient may use Passy-Muir Speech Valve: During all waking hours (remove during sleep) PMSV Supervision: Intermittent       Oral Care Recommendations: Oral care BID Follow up Recommendations: Skilled Nursing facility Plan: Continue with current plan of care   Fahim Kats L. Tivis Ringer, [place] CCC/SLP Pager [phone]    Shelby Larson 08/27/2014, 4:29 PM

## 2014-08-27 NOTE — Clinical Social Work Note (Signed)
Received phone call from patient's sister Myriam Jacobson, who was asking about bed offers for patient.  CSW informed patient's sister about the bed offers, and she is going to research and talk to some friends of hers.  CSW informed patient's sister that because she has a trach it is difficult to find SNFs that are willing to take her.  Patient's sister will contact CSW tomorrow to say which facility they have chosen.  Jones Broom. [place], MSW, [place] 08/27/2014 6:29 PM

## 2014-08-27 NOTE — Trach Care Team (Signed)
[place] Progression Note   Patient Details Name: Shelby Larson MRN: [id number] DOB: September 15, 1965 Today's Date: 08/27/2014   Tracheostomy Assessment    Tracheostomy Shiley 4 mm Uncuffed (Active)  Status Secured;Passy Muir Speaking valve 08/27/2014 11:58 AM  Site Assessment Clean;Dry 08/27/2014 11:58 AM  Site Care Dressing applied 08/26/2014  8:00 AM  Inner Cannula Care Cleansed/dried 08/26/2014 12:00 PM  Ties Assessment Clean;Dry;Secure 08/27/2014 11:58 AM  Cuff pressure (cm) 0 cm 08/27/2014  3:08 AM  Emergency Equipment at bedside Yes 08/27/2014 11:58 AM     Care Needs Sutures removed: 08/24/14 Trach downsize: 58mm   Respiratory Therapy O2 Device: Tracheostomy Collar FiO2 (%): 28 % SpO2: 100 %    Speech Language Pathology  Patient may use Passy-Muir Speech Valve: with SLP only, Intermittently with supervision, During PO intake/meals PMSV Supervision: Full Follow up Recommendations: Skilled Nursing facility   Physical Therapy Ambulation/Gait assistance: Min assist PT Recommendation/Assessment: Patient needs continued PT services Follow Up Recommendations: SNF, Supervision/Assistance - 24 hour PT equipment: Other (comment) (TBD)    Occupational Therapy OT Recommendation/Assessment: Patient needs continued OT Services Follow Up Recommendations: SNF, Supervision/Assistance - 24 hour OT Equipment: Other (comment), None recommended by OT (TBD at next level of care)    Nutritional Patient's Current Diet: NPO Tube Feeding: Jevity 1.2 Cal Tube Feeding Frequency: Continuous Tube Feeding Strength: 1/2 strength Diet Recommendations: Ice chips PRN after oral care, Dysphagia 1 (Puree), Nectar-thick liquid    Case Management/Social Work      Dispensing optician Care Team/Provider Recommendations [place] Team Members Present-  Ciro Backer, RT, Alvino Blood, SW, Molli Barrows, RD  Marni Griffon, NP None at present.            Duanna Runk, Jaci Carrel (scribe for team) 08/27/2014, 2:38 PM

## 2014-08-27 NOTE — Procedures (Signed)
Objective Swallowing Evaluation: Modified Barium Swallowing Study  Patient Details  Name: Shelby Larson MRN: [id number] Date of Birth: 26-Aug-1965  Today's Date: 08/27/2014 Time: SLP Start Time (ACUTE ONLY): 1345-SLP Stop Time (ACUTE ONLY): 1414 SLP Time Calculation (min) (ACUTE ONLY): 29 min  Past Medical History:  Past Medical History  Diagnosis Date  . Seizures     takes Gabapentin  . Heart murmur     as a child  . Chronic bronchitis   . Anxiety     panic attacks  . Depression   . Headache     onset a few months ago  . Neuropathy     had it in both hands  . Arthritis   . Fibromyalgia   . Cancer     throat cancer  . Anemia   . Rosacea    Past Surgical History:  Past Surgical History  Procedure Laterality Date  . Wisdom tooth extraction    . Tonsillectomy Left 08/20/2014    Procedure: TONSILLECTOMY;  Surgeon: Ruby Cola, MD;  Location: [facility] OR;  Service: ENT;  Laterality: Left;  . Radical neck dissection Left 08/20/2014    Procedure: RADICAL LEFT NECK DISSECTION ;  Surgeon: Ruby Cola, MD;  Location: [place];  Service: ENT;  Laterality: Left;  . Tracheostomy tube placement N/A 08/20/2014    Procedure: TRACHEOSTOMY - AWAKE;  Surgeon: Ruby Cola, MD;  Location: [facility] OR;  Service: ENT;  Laterality: N/A;   HPI:  HPI: 49 y.o female presented to the ED on 2/13 with hemoptysis and left neck mass.  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  Pt admitted 2/25 for tracheotomy,  biopsy left tongue base/oropharynx mass, left selective neck dissection.  Has #6 cuffed trach.  Plan is for radiotherapy with chemotherapy; PEG 2/29.   Initial MBS 2/27 moderate dysphagia attributable to lingual/pharyngeal mass, post-surgical effects, and presence of open trach (unable to safely use PMV at that time).  Pt demonstrated silent aspiration of thin liquids and poor propulsion of solids through pharynx.  Now with #6 cuffless trach; tolerating PMV.    No Data  Recorded  Assessment / Plan / Recommendation CHL IP CLINICAL IMPRESSIONS 08/27/2014  Dysphagia Diagnosis Mild pharyngeal phase dysphagia  Clinical impression Pt presents with a mild pharyngeal dysphagia, improved since 2/27 study.  Continues with trach - #6 cuffless - but now able to tolerate PMV and used valve throughout study.   Notable was reduced hyolaryngeal mobility and the appearance of reduced pharyngeal space. There continues to be vallecular residue post-swallow, but pt demonstrates improved awareness and improved ability to propel material through pharynx and UES. Limited, trace thin-liquid aspiration (just below level of vocal folds and of lesser amount than noted during prior MBS) occurred during the swallow on one occasion.  All other thin liquid boluses were consumed safely.   Recommend advancing diet to dysphagia 3, thin liquids with PMV in place for all PO consumption.  Pt should follow safety precautions with POs.  Rec SLP f/u at SNF for education/tx dysphagia associated with head and neck cancer and radiation tx.       CHL IP TREATMENT RECOMMENDATION 08/27/2014  Treatment Plan Recommendations Therapy as outlined in treatment plan below     CHL IP DIET RECOMMENDATION 08/27/2014  Diet Recommendations Dysphagia 3 (Mechanical Soft);Thin liquid  Liquid Administration via Straw;Cup  Medication Administration Crushed with puree  Compensations Clear throat intermittently;Slow rate;Small sips/bites  Postural Changes and/or Swallow Maneuvers Seated upright 90 degrees  CHL IP OTHER RECOMMENDATIONS 08/27/2014  Recommended Consults (None)  Oral Care Recommendations Oral care BID  Other Recommendations (None)     CHL IP FOLLOW UP RECOMMENDATIONS 08/27/2014  Follow up Recommendations Skilled Nursing facility     [facility] IP FREQUENCY AND DURATION 08/27/2014  Speech Therapy Frequency (ACUTE ONLY) min 3x week  Treatment Duration 1 week         SLP Swallow Goals No flowsheet data found.  No  flowsheet data found.    CHL IP REASON FOR REFERRAL 08/27/2014  Reason for Referral Objectively evaluate swallowing function     CHL IP ORAL PHASE 08/27/2014  Lips (None)  Tongue (None)  Mucous membranes (None)  Nutritional status (None)  Other (None)  Oxygen therapy (None)  Oral Phase WFL  Oral - Pudding Teaspoon (None)  Oral - Pudding Cup (None)  Oral - Honey Teaspoon (None)  Oral - Honey Cup (None)  Oral - Honey Syringe (None)  Oral - Nectar Teaspoon (None)  Oral - Nectar Cup (None)  Oral - Nectar Straw (None)  Oral - Nectar Syringe (None)  Oral - Ice Chips (None)  Oral - Thin Teaspoon (None)  Oral - Thin Cup (None)  Oral - Thin Straw (None)  Oral - Thin Syringe (None)  Oral - Puree (None)  Oral - Mechanical Soft (None)  Oral - Regular (None)  Oral - Multi-consistency (None)  Oral - Pill (None)  Oral Phase - Comment (None)      CHL IP PHARYNGEAL PHASE 08/27/2014  Pharyngeal Phase Impaired  Pharyngeal - Pudding Teaspoon (None)  Penetration/Aspiration details (pudding teaspoon) (None)  Pharyngeal - Pudding Cup (None)  Penetration/Aspiration details (pudding cup) (None)  Pharyngeal - Honey Teaspoon (None)  Penetration/Aspiration details (honey teaspoon) (None)  Pharyngeal - Honey Cup (None)  Penetration/Aspiration details (honey cup) (None)  Pharyngeal - Honey Syringe (None)  Penetration/Aspiration details (honey syringe) (None)  Pharyngeal - Nectar Teaspoon (None)  Penetration/Aspiration details (nectar teaspoon) (None)  Pharyngeal - Nectar Cup (None)  Penetration/Aspiration details (nectar cup) (None)  Pharyngeal - Nectar Straw NT  Penetration/Aspiration details (nectar straw) (None)  Pharyngeal - Nectar Syringe (None)  Penetration/Aspiration details (nectar syringe) (None)  Pharyngeal - Ice Chips (None)  Penetration/Aspiration details (ice chips) (None)  Pharyngeal - Thin Teaspoon (None)  Penetration/Aspiration details (thin teaspoon) (None)  Pharyngeal - Thin  Cup (None)  Penetration/Aspiration details (thin cup) (None)  Pharyngeal - Thin Straw Reduced airway/laryngeal closure;Reduced anterior laryngeal mobility;Reduced epiglottic inversion;Trace aspiration;Pharyngeal residue - valleculae  Penetration/Aspiration details (thin straw) Material enters airway, passes BELOW cords without attempt by patient to eject out (silent aspiration)  Pharyngeal - Thin Syringe (None)  Penetration/Aspiration details (thin syringe') (None)  Pharyngeal - Puree Reduced epiglottic inversion;Reduced anterior laryngeal mobility;Reduced laryngeal elevation;Pharyngeal residue - valleculae  Penetration/Aspiration details (puree) (None)  Pharyngeal - Mechanical Soft Reduced epiglottic inversion;Reduced anterior laryngeal mobility;Reduced laryngeal elevation;Pharyngeal residue - valleculae  Penetration/Aspiration details (mechanical soft) (None)  Pharyngeal - Regular (None)  Penetration/Aspiration details (regular) (None)  Pharyngeal - Multi-consistency (None)  Penetration/Aspiration details (multi-consistency) (None)  Pharyngeal - Pill (None)  Penetration/Aspiration details (pill) (None)  Pharyngeal Comment (None)     No flowsheet data found.  No flowsheet data found.         Juan Quam Laurice 08/27/2014, 3:14 PM

## 2014-08-27 NOTE — Progress Notes (Signed)
I received a call from Gwenlyn Saran sister Myriam Jacobson. Ms. Boening is currently a patient at [facility] and is having a difficult time voicing her concerns. She recently had a trach and peg tube inserted. Ann (who's a Marine scientist, lives out of town) is concerned with the treatment plan along with living arrangements and transportation. According to the sister, Ms. Hench is an alcoholic who lives at home with her 49 year old mother. Notified Social work and [name].

## 2014-08-27 NOTE — Progress Notes (Signed)
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 Awaiting SNF placement. On tube feeds, stable overnight.  Objective: Vital signs in last 24 hours: Temp:  [98.2 F (36.8 C)-98.4 F (36.9 C)] 98.2 F (36.8 C) (03/03 0535) Pulse Rate:  [79-94] 94 (03/03 0535) Resp:  [16-18] 17 (03/03 0535) BP: (96-148)/(63-99) 96/63 mmHg (03/03 0535) SpO2:  [93 %-100 %] 95 % (03/03 0535) FiO2 (%):  [28 %] 28 % (03/03 0535) Weight:  [61.7 kg (136 lb 0.4 oz)] 61.7 kg (136 lb 0.4 oz) (03/03 0535)  Left neck incision healing well. 4 cuffless trach secure but with copious secretions, I upsized her trach to a new 6 cuffless Shiley and suctioned her tracheostomy. Able to voice with finger occlusion and good cough to clear secretions.  @LABLAST2 (wbc:2,hgb:2,hct:2,plt:2) No results for input(s): NA, K, CL, CO2, GLUCOSE, BUN, CREATININE, CALCIUM in the last 72 hours.  Medications:  Scheduled Meds: . sodium chloride   Intravenous Once  . sodium chloride   Intravenous Once  . bacitracin   Topical TID  . cyclobenzaprine  10 mg Oral TID  . feeding supplement (JEVITY 1.2 CAL)  480 mL Per Tube 3 times per day  . feeding supplement (PRO-STAT SUGAR FREE 64)  30 mL Per Tube Daily  . folic acid  1 mg Oral Daily  . gabapentin  600 mg Oral TID  . magnesium gluconate  500 mg Oral Daily  . multivitamin with minerals  1 tablet Oral Daily  . pantoprazole (PROTONIX) IV  40 mg Intravenous QHS  . promethazine  12.5 mg Intravenous Once  . sertraline  100 mg Oral Daily  . thiamine  100 mg Oral Daily   Continuous Infusions: . dextrose 5 % and 0.45 % NaCl with KCl 10 mEq/L 75 mL/hr at 08/27/14 0410  . lactated ringers 10 mL/hr at 08/20/14 0813   PRN Meds:.acetaminophen (TYLENOL) oral liquid 160 mg/5 mL, acetaminophen-codeine, diphenhydrAMINE **OR** diphenhydrAMINE, docusate, glucagon, HYDROcodone-acetaminophen, HYDROmorphone (DILAUDID) injection, LORazepam, methocarbamol,  naphazoline, nicotine, ondansetron, pneumococcal 23 valent vaccine, QUEtiapine, RESOURCE THICKENUP CLEAR  Assessment/Plan: POD#7 from awake trach, left SND, biopsy pharynx mass for T4aN2bM0 SCC pharynx. Stable. upsized trach to 6 cuffless today to help with secretions since she will need trach for at least several weeks if not permanently. Awaiting SNF placement then can set up outpatient radiation and medical oncology referrals.   LOS: 7 days   Ruby Cola 08/27/2014, 8:39 AM

## 2014-08-28 ENCOUNTER — Telehealth: Payer: Self-pay | Admitting: *Deleted

## 2014-08-28 MED ORDER — JEVITY 1.2 CAL PO LIQD
ORAL | Status: AC
  Filled 2014-08-28: qty 237

## 2014-08-28 MED ORDER — PRO-STAT SUGAR FREE PO LIQD: 30.0000 mL | Freq: Every day | ORAL | Status: DC

## 2014-08-28 MED ORDER — JEVITY 1.2 CAL PO LIQD: 480.0000 mL | Freq: Three times a day (TID) | ORAL | Status: DC

## 2014-08-28 MED ORDER — BACITRACIN ZINC 500 UNIT/GM EX OINT: TOPICAL_OINTMENT | Freq: Three times a day (TID) | CUTANEOUS | Status: DC

## 2014-08-28 MED ORDER — ACETAMINOPHEN 160 MG/5ML PO SOLN: 325.0000 mg | ORAL | Status: DC | PRN

## 2014-08-28 MED ORDER — JEVITY 1.2 CAL PO LIQD
ORAL | Status: AC
  Administered 2014-08-28: 480 mL
  Filled 2014-08-28: qty 237

## 2014-08-28 MED ORDER — ACETAMINOPHEN-CODEINE 120-12 MG/5ML PO SOLN: 10.0000 mL | ORAL | Status: DC | PRN

## 2014-08-28 NOTE — Discharge Instructions (Addendum)
Follow up with Dr. Simeon Craft in 2 weeks, follow up with [facility] 09/09/14. Apply OTC neosporin ointment to left neck TID. Trach suctioning as needed, passy muir valve ad lib while awake and when eating. Soft or pureed diet with nectar thick or thin liquids as tolerated and supplement with jevity 1.2cal 2 cans per tube TID as needed. Neck sutures are absorbable. Rx for trach/PEG supplies, tylenol with codeine, zofran on chart.

## 2014-08-28 NOTE — Clinical Social Work Note (Signed)
CSW called the pt's sister Webb Silversmith to confirm SNF selection. Webb Silversmith reported she would like the pt to transition to [facility]. CSW contacted [facility] to see if they can take the pt today. CSW will contiune to follow and assist with discharge.   30 note faxed to [facility], MSW, [place]

## 2014-08-28 NOTE — Progress Notes (Addendum)
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 Stable, tolerating PO and tube feeds, breathing well and voicing well with passy-muir, awaiting SNF placement.  Objective: Vital signs in last 24 hours: Temp:  [98.2 F (36.8 C)-99.2 F (37.3 C)] 98.5 F (36.9 C) (03/04 0500) Pulse Rate:  [75-101] 75 (03/04 0500) Resp:  [18-22] 20 (03/04 0500) BP: (113-118)/(70-72) 118/72 mmHg (03/04 0500) SpO2:  [96 %-100 %] 98 % (03/04 0500) FiO2 (%):  [28 %] 28 % (03/04 0500) Weight:  [62 kg (136 lb 11 oz)] 62 kg (136 lb 11 oz) (03/04 0500)  6 cuffed trach in place and secure with passy-muir, good sats, patient has intelligible voice with passy-muir valve, left neck incision clean dry and intact with absorbable sutures in place, Cn 2-12 intact and symmetric.  @LABLAST2 (wbc:2,hgb:2,hct:2,plt:2) No results for input(s): NA, K, CL, CO2, GLUCOSE, BUN, CREATININE, CALCIUM in the last 72 hours.  Medications:  Scheduled Meds: . sodium chloride   Intravenous Once  . sodium chloride   Intravenous Once  . bacitracin   Topical TID  . cyclobenzaprine  10 mg Oral TID  . feeding supplement (JEVITY 1.2 CAL)  480 mL Per Tube 3 times per day  . feeding supplement (PRO-STAT SUGAR FREE 64)  30 mL Per Tube Daily  . folic acid  1 mg Oral Daily  . gabapentin  600 mg Oral TID  . magnesium gluconate  500 mg Oral Daily  . multivitamin with minerals  1 tablet Oral Daily  . pantoprazole (PROTONIX) IV  40 mg Intravenous QHS  . promethazine  12.5 mg Intravenous Once  . sertraline  100 mg Oral Daily  . thiamine  100 mg Oral Daily   Continuous Infusions: . dextrose 5 % and 0.45 % NaCl with KCl 10 mEq/L 75 mL/hr at 08/28/14 0638  . 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 To SNF when available and setting up radiation/medical oncology appointments for definitive treatment. My office set up appointment with Oncology at [facility] for 09/09/14.   LOS: 8 days   Ruby Cola 08/28/2014, 7:14 AM

## 2014-08-28 NOTE — Progress Notes (Signed)
Speech Language Pathology Treatment: Dysphagia;Passy Muir Speaking valve  Patient Details Name: Shelby Larson MRN: [id number] DOB: 1965/11/26 Today's Date: 08/28/2014 Time: 1125-1207 SLP Time Calculation (min) (ACUTE ONLY): 42 min  Assessment / Plan / Recommendation Clinical Impression  Pt reports today was first day of real po intake and she tolerated well.  She does admit to issues with ongoing secretions requiring her to expectorate throughout the day.  Wet vocal quality noted x1 during session with pt demonstrating reflexive throat clearing.    SLP reviewed MBS with pt providing diagram and copies of swallow/PMSV precaution signs for education.  Pt taught back reasoning for strategies.  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    Pt removed and donned PMSV with mod independence today.  Manipulating trach collar and PMSV can be difficult but she conducted well.  Using teach back reinforced need to cough and expectorate secretions prior to placing valve and am.  Reviewed physiological benefits to its use - not just communication and swallow.  Pt was encouraged and reports she will wear it more often now.    Recommend pt have follow up SLP at [facility] for dysphagia/PMSV management.   Educated her briefly to probable/possible changes in swallow with radiation - immediate, short term and long term.  Encouraged pt to conduct exercises life long after completion of all treatment and swallow ability actualized.     HPI HPI: 49 y.o female presented to the ED on 2/13 with hemoptysis and left neck mass.  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  Pt admitted 2/25 for tracheotomy,  biopsy left tongue base/oropharynx mass, left selective neck dissection.  Has #6 cuffed trach.  Plan is for radiotherapy with chemotherapy; PEG 2/29.   Initial MBS 2/27 moderate dysphagia attributable to lingual/pharyngeal mass, post-surgical  effects, and presence of open trach (unable to safely use PMV at that time).  Pt demonstrated silent aspiration of thin liquids and poor propulsion of solids through pharynx.  Now with #6 cuffless trach; tolerating PMV.     Pertinent Vitals Pain Assessment:  (pt reports chronic pain, face, neck - nursing students/instructor  in room)  SLP Plan  Continue with current plan of care    Recommendations Diet recommendations: Dysphagia 3 (mechanical soft);Thin liquid Liquids provided via: Straw Medication Administration:  (crushed with applesauce) Supervision: Patient able to self feed Compensations: Clear throat intermittently;Slow rate;Small sips/bites (start meals with liquids) Postural Changes and/or Swallow Maneuvers: Seated upright 90 degrees      Patient may use Passy-Muir Speech Valve: During all waking hours (remove during sleep) (or when receiving breathing tx) PMSV Supervision: Full       Oral Care Recommendations: Oral care BID Follow up Recommendations: Skilled Nursing facility Plan: Continue with current plan of care    [place], [place] [facility] SLP [phone]

## 2014-08-28 NOTE — Telephone Encounter (Signed)
Spoke with patient's mother in [place] and sister in [place] s/p patient's 08/27/14 referral to see [name] next Wed, March 9.  Introduced myself as the navigator that works with [name], described my role as a member of the Care Team.  They shared that the plan is for patient to be DC'd to a facility that can manage her trach and feeding tube though specific facility has yet to be identified.  They indicated, also, that the hope is that patient can be treated closer to home.  Sister indicated a friend of hers will be transporting patient and her mother to the appt next week.  I provided them my contact information and encouraged them to contact me if they have questions/concerns prior to next [facility] appt.  Gayleen Orem, RN, BSN, [place] at [place] [phone]

## 2014-08-28 NOTE — Telephone Encounter (Addendum)
Called patient at [facility] [place] where she is admitted s/p recent surgery.  Introduced myself as the navigator who works with [name].  I indicated I will be joining her during her appt with [name] next Wednesday.  She indicated she expects to be DC'd by Monday.  I provided her my contact #, encouraged her to call me with any questions prior to her appt with [name].  Gayleen Orem, RN, BSN, [place] at [place] [phone]

## 2014-08-28 NOTE — Progress Notes (Signed)
IV fluids D/C Adapter left in place to left hand PEG tube plugged  Report called to [facility]  EMS transported patient

## 2014-08-31 ENCOUNTER — Telehealth: Payer: Self-pay | Admitting: *Deleted

## 2014-08-31 NOTE — Telephone Encounter (Signed)
Returned patient's VM.   1. We discussed her upcoming Wed appt with [name], preference to be treated in [place], and in that context, a tentative appt with Dr. Imagene Gurney that is already scheduled pending Dr. Calton Dach referral. She expects to be accompanied by her mother and friend. 2. She stated she is currently at [facility], she expects to be brought to her appt by a friend.  Gayleen Orem, RN, BSN, [place] at [place] [phone]

## 2014-09-01 ENCOUNTER — Encounter: Payer: Self-pay | Admitting: Radiation Oncology

## 2014-09-01 NOTE — Progress Notes (Addendum)
Head and Neck Cancer Location of Tumor / Histology: left pharynx/tongue base  Patient presented 1 months ago with symptoms of: hemoptysis, left neck mass  Biopsies of left neck, FNA(if applicable) revealed:  6/96/29 Suspicious for squamous cell carcinoma  Nutrition Status Yes No Comments  Weight changes? '[x]'  '[]'  6 lb wgt gain this month  Swallowing concerns? '[x]'  '[]'  Post op  PEG? '[x]'  '[]'  Already placed, pureed/soft foods as tol, thin liquids   Referrals Yes No Comments  Social Work? '[x]'  '[]'  [employer]   Dentistry? '[]'  '[x]'    Swallowing therapy? '[x]'  '[]'  08/27/14  Nutrition? '[]'  '[x]'    Med/Onc? '[x]'  '[]'  [facility] appt on 09/14/14   Safety Issues Yes No Comments  Prior radiation? '[]'  '[x]'    Pacemaker/ICD? '[]'  '[x]'    Possible current pregnancy? '[]'  '[x]'    Is the patient on methotrexate? '[]'  '[x]'     Tobacco/Marijuana/Snuff/ETOH use: cigarette smoker 1/2 PPD x 20 years, quit 05/2014, no drug use, alcohol use occasional, but no longer using 09/02/14  Past/Anticipated interventions by otolaryngology, if any: Dr Simeon Craft 08/20/14 Left selective neck dissection with sparing of 11th cranial nerve, sternocleidomastoid muscle, internal jugular vein, biopsy , tracheotomy  Past/Anticipated interventions by medical oncology, if any: new consult with Dr Dellie Catholic on 09/14/14  Current Complaints / other details:  currently at [facility], lives with mother otherwise Patient states she cannot remember who she met or talked to in the hospital for several days following her surgery on 08/20/14 C/o peg tube site, states "it feels like it is pulling", site is slightly pink, no drainage noted. She is taking in Jevity 2 cans daily, eating regular diet without difficulty of eating, chewing, swallowing. She states her appetite fluctuates but this is her norm. Pain of right side of face and neck, incisional area, also c/o pain at her peg tube site.  Takes Hydrocodone prn for pain.

## 2014-09-02 ENCOUNTER — Ambulatory Visit
Admit: 2014-09-02 | Discharge: 2014-09-02 | Disposition: A | Discharge status: Home / Self Care | Payer: 59 | Attending: Radiation Oncology | Admitting: Radiation Oncology

## 2014-09-02 ENCOUNTER — Telehealth: Payer: Self-pay | Admitting: Radiation Oncology

## 2014-09-02 ENCOUNTER — Encounter: Payer: Self-pay | Admitting: Radiation Oncology

## 2014-09-02 ENCOUNTER — Encounter: Payer: Self-pay | Admitting: *Deleted

## 2014-09-02 ENCOUNTER — Ambulatory Visit
Admission: RE | Admit: 2014-09-02 | Discharge: 2014-09-02 | Disposition: A | Discharge status: Home / Self Care | Payer: 59 | Source: Ambulatory Visit | Attending: Radiation Oncology | Admitting: Radiation Oncology

## 2014-09-02 VITALS — BP 90/62 | HR 89 | Temp 99.2°F | Resp 20 | Ht 66.0 in | Wt 122.5 lb

## 2014-09-02 DIAGNOSIS — R590 Localized enlarged lymph nodes: Secondary | ICD-10-CM | POA: Insufficient documentation

## 2014-09-02 DIAGNOSIS — R635 Abnormal weight gain: Secondary | ICD-10-CM

## 2014-09-02 DIAGNOSIS — Z931 Gastrostomy status: Secondary | ICD-10-CM | POA: Insufficient documentation

## 2014-09-02 DIAGNOSIS — C140 Malignant neoplasm of pharynx, unspecified: Secondary | ICD-10-CM

## 2014-09-02 DIAGNOSIS — C01 Malignant neoplasm of base of tongue: Secondary | ICD-10-CM | POA: Diagnosis present

## 2014-09-02 DIAGNOSIS — Z87891 Personal history of nicotine dependence: Secondary | ICD-10-CM | POA: Diagnosis not present

## 2014-09-02 HISTORY — DX: Pure hypercholesterolemia, unspecified: E78.00

## 2014-09-02 LAB — CBC WITH DIFFERENTIAL/PLATELET
BASO%: 0.3 % (ref 0.0–2.0)
BASOS ABS: 0 10*3/uL (ref 0.0–0.1)
EOS%: 1.7 % (ref 0.0–7.0)
Eosinophils Absolute: 0.2 10*3/uL (ref 0.0–0.5)
HCT: 34.8 % (ref 34.8–46.6)
HEMOGLOBIN: 11.1 g/dL — AB (ref 11.6–15.9)
LYMPH%: 12.9 % — AB (ref 14.0–49.7)
MCH: 29.5 pg (ref 25.1–34.0)
MCHC: 31.9 g/dL (ref 31.5–36.0)
MCV: 92.6 fL (ref 79.5–101.0)
MONO#: 1.1 10*3/uL — ABNORMAL HIGH (ref 0.1–0.9)
MONO%: 9.8 % (ref 0.0–14.0)
NEUT#: 8.5 10*3/uL — ABNORMAL HIGH (ref 1.5–6.5)
NEUT%: 75.3 % (ref 38.4–76.8)
Platelets: 396 10*3/uL (ref 145–400)
RBC: 3.76 10*6/uL (ref 3.70–5.45)
RDW: 15.4 % — AB (ref 11.2–14.5)
WBC: 11.3 10*3/uL — ABNORMAL HIGH (ref 3.9–10.3)
lymph#: 1.5 10*3/uL (ref 0.9–3.3)

## 2014-09-02 LAB — BASIC METABOLIC PANEL (CC13)
ANION GAP: 9 meq/L (ref 3–11)
BUN: 10.7 mg/dL (ref 7.0–26.0)
CO2: 27 meq/L (ref 22–29)
Calcium: 10.1 mg/dL (ref 8.4–10.4)
Chloride: 102 mEq/L (ref 98–109)
Creatinine: 0.8 mg/dL (ref 0.6–1.1)
EGFR: 88 mL/min/{1.73_m2} — AB (ref >90)
Glucose: 87 mg/dl (ref 70–140)
Potassium: 4.5 mEq/L (ref 3.5–5.1)
Sodium: 137 mEq/L (ref 136–145)

## 2014-09-02 LAB — T4, FREE: FREE T4: 1.13 ng/dL (ref 0.80–1.80)

## 2014-09-02 LAB — TSH CHCC: TSH: 4.239 m(IU)/L — ABNORMAL HIGH (ref 0.308–3.960)

## 2014-09-02 MED ORDER — LORAZEPAM 0.5 MG PO TABS: ORAL_TABLET | ORAL | Status: DC

## 2014-09-02 MED ORDER — SUCRALFATE 1 G PO TABS: ORAL_TABLET | ORAL | Status: DC

## 2014-09-02 NOTE — Progress Notes (Signed)
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 female from [place], [place] with h/o tobacco and ETOH abuse (she quit both in the past few weeks) who presented with a growing left neck mass and hemoptysis.  CT Scan of neck and chest on 08-08-14 revealed 1. 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 The mass causes moderate to advanced airway narrowing. For staging, the tumor involves the tongue base, left glossotonsillar sulcus, lingual epiglottis and likely preepiglottic fat. 2. Bilateral cervical adenopathy, levels noted above. A lateral left retropharyngeal lymph node is also suspicious due to asymmetry. 3. No evidence of thoracic metastatic disease.  On 08-20-14 she underwent biopsy of tongue base and left neck dissection. Biopsy of tumor was + for mod differentiated squamous cell carcinoma, p16 negative.  3/16 nodes were positive, and ECE was present.  Largest node 3.4 cm  CT of Abdomen without cont on 08-21-14 revealed no obvious metastatic disease.  PET has not been done.   Tracheostomy and PEG tube have been placed, and she is recovering from the inpatient stay in [facility] care. She has become adept at [facility] and trach care but would appreciate home health support, eventually, after discharge.       Nutrition Status Yes No Comments  Weight changes? [x]  []  6 lb wgt gain this month  Swallowing concerns? [x]  []  Post op  PEG? [x]  []  Already placed, pureed/soft foods as tol, thin liquids   Referrals Yes No Comments  Social Work? [x]  []  [employer]     Dentistry? []  [x]    Swallowing therapy? [x]  []  08/27/14  Nutrition? []  [x]    Med/Onc? [x]  []  [facility] onc appt on 09/14/14   Safety Issues Yes No Comments  Prior radiation? []  [x]    Pacemaker/ICD? []  [x]    Possible current pregnancy? []  [x]    Is the patient on methotrexate? []  [x]     Tobacco/Marijuana/Snuff/ETOH use: cigarette smoker 1/2 PPD x 20 years, quit 05/2014, no drug use, alcohol use occasional, but no longer using 09/02/14  Past/Anticipated interventions by otolaryngology, if any: Dr Simeon Craft 08/20/14 Left selective neck dissection with sparing of 11th cranial nerve, sternocleidomastoid muscle, internal jugular vein, biopsy , tracheotomy  Past/Anticipated interventions by medical oncology, if any: new consult with Dr Dellie Catholic on 09/14/14  Current Complaints / other details:  currently at [facility], lives with mother otherwise She is taking in [place] 2 cans daily, eating regular diet without difficulty of eating, chewing, swallowing. She states her appetite fluctuates but this is her norm. Pain of right side of face and neck, incisional area, also c/o pain at her peg tube site.  Takes Hydrocodone prn for pain. Sore throat.   PATHOLOGY: REPORT OF SURGICAL PATHOLOGY ADDITIONAL INFORMATION: 1. There is absence of p16 immunostain expression within the invasive tumor. (CR:kh 08/24/14) Mali RUND DO Pathologist, Electronic Signature ( Signed 08/24/2014) FINAL DIAGNOSIS Diagnosis 1. Tongue, biopsy, left base/ pharynx mass - INVASIVE MODERATELY DIFFERENTIATED SQUAMOUS CELL CARCINOMA. - SEE COMMENT. 2. Lymph nodes, radical neck dissection, Left - THREE OF SIXTEEN LYMPH NODES POSITIVE FOR METASTATIC SQUAMOUS CELL CARCINOMA (  3/16). - LARGEST LYMPH NODE IS LARGELY REPLACED BY TUMOR, MEASURES 3.4 CM IN GREATEST DIMENSION AND SHOWS EVIDENCE OF EXTRACAPSULAR EXTENSION. Microscopic Comment 1. The tissue is received fragmented and consists of fragments of invasive  squamous cell carcinoma. The tumor appears moderately differentiated and arises from the superficial benign squamous mucosal surface.   PREVIOUS RADIATION THERAPY: No  PAST MEDICAL HISTORY:  has a past medical history of Seizures; Heart murmur; Chronic bronchitis; Anxiety; Depression; Headache; Neuropathy; Arthritis; Fibromyalgia; Cancer; Anemia; Rosacea; and Hypercholesterolemia.    PAST SURGICAL HISTORY: Past Surgical History  Procedure Laterality Date  . Wisdom tooth extraction    . Tonsillectomy Left 08/20/2014    Procedure: TONSILLECTOMY;  Surgeon: Ruby Cola, MD;  Location: [facility] OR;  Service: ENT;  Laterality: Left;  . Radical neck dissection Left 08/20/2014    Procedure: RADICAL LEFT NECK DISSECTION ;  Surgeon: Ruby Cola, MD;  Location: [place];  Service: ENT;  Laterality: Left;  . Tracheostomy tube placement N/A 08/20/2014    Procedure: TRACHEOSTOMY - AWAKE;  Surgeon: Ruby Cola, MD;  Location: [place];  Service: ENT;  Laterality: N/A;    FAMILY HISTORY: family history includes Alcoholism in her father and sister; Hypertension in her mother; Neuropathy in her mother.  SOCIAL HISTORY:  reports that she quit smoking about 2 months ago. Her smoking use included Cigarettes. She has a 10 pack-year smoking history. She has never used smokeless tobacco. She reports that she drinks alcohol. She reports that she does not use illicit drugs.  ALLERGIES: Bee venom; Penicillins; and Latex  MEDICATIONS:  Current Outpatient Prescriptions  Medication Sig Dispense Refill  . Acetaminophen (CHLORASEPTIC SORE THROAT PO) Take 2 sprays by mouth daily as needed (sore throat).    Marland Kitchen acetaminophen (TYLENOL) 160 MG/5ML solution Take 10.2 mLs (325 mg total) by mouth every 4 (four) hours as needed for mild pain, moderate pain or headache. 473 mL 0  . acetaminophen-codeine 120-12 MG/5ML solution Take 10 mLs by mouth every 4 (four) hours as needed for moderate pain or severe pain (PO or per tube; take the  tylenol with codeine OR the hydrocodone, not both). 500 mL 0  . Amino Acids-Protein Hydrolys (FEEDING SUPPLEMENT, PRO-STAT SUGAR FREE 64,) LIQD Place 30 mLs into feeding tube daily. 900 mL 0  . bacitracin ointment Apply topically 3 (three) times daily. Apply to left neck incision TID x 3 weeks 120 g 2  . cyclobenzaprine (FLEXERIL) 10 MG tablet Take 10 mg by mouth 3 (three) times daily.    . fluticasone (FLONASE) 50 MCG/ACT nasal spray Place 2 sprays into both nostrils daily.    Marland Kitchen gabapentin (NEURONTIN) 600 MG tablet Take 600 mg by mouth 3 (three) times daily.    Marland Kitchen HYDROcodone-acetaminophen (NORCO/VICODIN) 5-325 MG per tablet Take 1 tablet by mouth every 6 (six) hours as needed for severe pain. 15 tablet 0  . lidocaine (XYLOCAINE) 2 % solution Use as directed 20 mLs in the mouth or throat as needed for mouth pain. 100 mL 0  . MAGNESIUM PO Take 2 tablets by mouth daily.    . Multiple Vitamin (MULTIVITAMIN WITH MINERALS) TABS tablet Take 1 tablet by mouth daily.    . Nutritional Supplements (FEEDING SUPPLEMENT, JEVITY 1.2 CAL,) LIQD Place 480 mLs into feeding tube every 8 (eight) hours. 10080 mL PRN  . sertraline (ZOLOFT) 100 MG tablet Take 100 mg by mouth daily.    Marland Kitchen tetrahydrozoline-zinc (VISINE-AC) 0.05-0.25 % ophthalmic solution Place 2 drops into both eyes daily as needed (  dry eyes).    . LORazepam (ATIVAN) 0.5 MG tablet Take 1-2 tablets 20 minutes prior to wearing radiation mask, PRN anxiety 40 tablet 0  . methocarbamol (ROBAXIN) 500 MG tablet Take 500 mg by mouth every 8 (eight) hours as needed for muscle spasms.    . naproxen sodium (ANAPROX) 220 MG tablet Take 440 mg by mouth daily as needed (pain).    . QUEtiapine (SEROQUEL) 50 MG tablet Take 50 mg by mouth daily as needed (sleep).    . sucralfate (CARAFATE) 1 G tablet Dissolve 1 tablet in 10 mL H20 and swallow 30 min prior to meals and bedtime. 60 tablet 5   No current facility-administered medications for this encounter.    REVIEW OF  SYSTEMS:  Notable for that above.   PHYSICAL EXAM:  height is 5\' 6"  (1.676 m) and weight is 122 lb 8 oz (55.566 kg). Her oral temperature is 99.2 F (37.3 C). Her blood pressure is 90/62 and her pulse is 89. Her respiration is 20 and oxygen saturation is 99%.   General: Alert and oriented, in no acute distress HEENT: Head is normocephalic. Extraocular movements are intact. Oropharynx is proximally clear. Neck: postoperative sutures along left neck, no palpable cervical or supraclavicular lymphadenopathy. +Tracheostomy, collar Heart: Regular in rate and rhythm with no murmurs, rubs, or gallops. Chest: faint b/l wheezes. Abdomen: PEG site unremarkable Extremities: No cyanosis or edema.  Lymphatics: see Neck Exam Skin: No concerning lesions. Musculoskeletal: symmetric strength and muscle tone throughout. Neurologic: Cranial nerves II through XII are grossly intact. No obvious focalities. Speech is fluent. Coordination is intact. Psychiatric: Judgment and insight are intact. Affect is appropriate.  ECOG = 1  0 - Asymptomatic (Fully active, able to carry on all predisease activities without restriction)  1 - Symptomatic but completely ambulatory (Restricted in physically strenuous activity but ambulatory and able to carry out work of a light or sedentary nature. For example, light housework, office work)  2 - Symptomatic, <50% in bed during the day (Ambulatory and capable of all self care but unable to carry out any work activities. Up and about more than 50% of waking hours)  3 - Symptomatic, >50% in bed, but not bedbound (Capable of only limited self-care, confined to bed or chair 50% or more of waking hours)  4 - Bedbound (Completely disabled. Cannot carry on any self-care. Totally confined to bed or chair)  5 - Death   Eustace Pen MM, Creech RH, Tormey DC, et al. [phone]). "Toxicity and response criteria of the [facility]". [place] Oncol. 5 (6):  [phone]   LABORATORY DATA:  Lab Results  Component Value Date   WBC 11.3* 09/02/2014   HGB 11.1* 09/02/2014   HCT 34.8 09/02/2014   MCV 92.6 09/02/2014   PLT 396 09/02/2014   CMP     Component Value Date/Time   NA 137 09/02/2014 1007   NA 135 08/21/2014 0242   K 4.5 09/02/2014 1007   K 4.0 08/21/2014 0242   CL 96 08/21/2014 0242   CO2 27 09/02/2014 1007   CO2 26 08/21/2014 0242   GLUCOSE 87 09/02/2014 1007   GLUCOSE 108* 08/21/2014 0242   BUN 10.7 09/02/2014 1007   BUN 5* 08/21/2014 0242   CREATININE 0.8 09/02/2014 1007   CREATININE 0.84 08/21/2014 0242   CALCIUM 10.1 09/02/2014 1007   CALCIUM 7.9* 08/21/2014 0242   PROT 6.4 08/21/2014 0242   ALBUMIN 3.3* 08/21/2014 0242   AST 27 08/21/2014 0242  ALT 12 08/21/2014 0242   ALKPHOS 76 08/21/2014 0242   BILITOT 1.1 08/21/2014 0242   GFRNONAA 81* 08/21/2014 0242   GFRAA >90 08/21/2014 0242      Lab Results  Component Value Date   TSH 4.239* 09/02/2014      RADIOGRAPHY: Ct Abdomen Wo Contrast  08/21/2014   CLINICAL DATA:  Preop gastrostomy tube planning.  EXAM: CT ABDOMEN WITHOUT CONTRAST  TECHNIQUE: Multidetector CT imaging of the abdomen was performed following the standard protocol without IV contrast.  COMPARISON:  Abdomen 08/20/2014  FINDINGS: Consolidation or atelectasis in both lung bases. No pleural effusions. Enteric tube tip terminates in the distal esophagus.  The unenhanced appearance of the liver, spleen, gallbladder, pancreas, adrenal glands, abdominal aorta, inferior vena cava, and retroperitoneal lymph nodes is unremarkable. Small accessory spleen. 2 mm stone in the midportion right kidney. No hydronephrosis in either kidney. Stomach is gas-filled without abnormal distention or wall thickening. No evidence of colonic interposition anterior to the stomach. Small bowel are decompressed. Diffusely stool-filled colon. No free air or free fluid in the abdomen. Visualized abdominal wall musculature appears intact.  Degenerative disc disease at L5-S1. No destructive bone lesions.  IMPRESSION: Consolidation in both lung bases. Enteric tube tip terminates in the distal esophagus. Nonobstructing stone in the right kidney. No evidence of bowel obstruction or dilatation.   Electronically Signed   By: Lucienne Capers M.D.   On: 08/21/2014 18:51   Ct Soft Tissue Neck W Contrast  08/08/2014   CLINICAL DATA:  Hemoptysis.  EXAM: CT NECK WITH CONTRAST  CHEST CT WITH CONTRAST  TECHNIQUE: Multidetector CT imaging of the neck and chest was performed using the standard protocol following the bolus administration of intravenous contrast.  CONTRAST:  62mL OMNIPAQUE IOHEXOL 300 MG/ML  SOLN  COMPARISON:  None.  FINDINGS: Pharynx and larynx: 3.2 cm mass in the left oropharynx, centered to the left with broad contact with the left posterior tongue and glossotonsillar sulcus. Probable early extension into the pre epiglottic fat. The margin with the lingual epiglottis is indistinct, but this could be displacement rather than invasion. No glottis or infraglottic tumor. No evidence of cord paralysis.  Adenopathy in the left cervical chain, levels IIA/B, III, and IV. The largest left node is in the jugulodigastric measuring 23 mm in maximal diameter. The right jugulodigastric node is mildly enlarged but appears heterogeneous in enhancement. There is mild but asymmetric enlargement of the lateral left retropharyngeal lymph node, without necrosis.  Salivary glands: Negative  Thyroid: Negative  Vascular: Cervical carotid atherosclerosis without hemodynamically significant stenosis.  Limited intracranial: Negative  Mastoids and visualized paranasal sinuses: Negative  Skeleton: No evidence of metastasis  THORACIC INLET/BODY WALL:  No acute abnormality.  MEDIASTINUM:  Normal heart size. No pericardial effusion. No acute vascular abnormality. No adenopathy.  LUNG WINDOWS:  Suspect mild emphysema. No consolidation. No effusion. No suspicious pulmonary  nodule.  UPPER ABDOMEN:  No evidence of metastatic disease  OSSEOUS:  No acute fracture.  No suspicious lytic or blastic lesions.  IMPRESSION: 1. 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 The mass causes moderate to advanced airway narrowing. For staging, the tumor involves the tongue base, left glossotonsillar sulcus, lingual epiglottis and likely preepiglottic fat. 2. Bilateral cervical adenopathy, levels noted above. A lateral left retropharyngeal lymph node is also suspicious due to asymmetry. 3. No evidence of thoracic metastatic disease.   Electronically Signed   By: Monte Fantasia M.D.   On: 08/08/2014 12:42   Ct  Chest W Contrast  08/08/2014   CLINICAL DATA:  Hemoptysis.  EXAM: CT NECK WITH CONTRAST  CHEST CT WITH CONTRAST  TECHNIQUE: Multidetector CT imaging of the neck and chest was performed using the standard protocol following the bolus administration of intravenous contrast.  CONTRAST:  83mL OMNIPAQUE IOHEXOL 300 MG/ML  SOLN  COMPARISON:  None.  FINDINGS: Pharynx and larynx: 3.2 cm mass in the left oropharynx, centered to the left with broad contact with the left posterior tongue and glossotonsillar sulcus. Probable early extension into the pre epiglottic fat. The margin with the lingual epiglottis is indistinct, but this could be displacement rather than invasion. No glottis or infraglottic tumor. No evidence of cord paralysis.  Adenopathy in the left cervical chain, levels IIA/B, III, and IV. The largest left node is in the jugulodigastric measuring 23 mm in maximal diameter. The right jugulodigastric node is mildly enlarged but appears heterogeneous in enhancement. There is mild but asymmetric enlargement of the lateral left retropharyngeal lymph node, without necrosis.  Salivary glands: Negative  Thyroid: Negative  Vascular: Cervical carotid atherosclerosis without hemodynamically significant stenosis.  Limited intracranial: Negative  Mastoids and visualized paranasal  sinuses: Negative  Skeleton: No evidence of metastasis  THORACIC INLET/BODY WALL:  No acute abnormality.  MEDIASTINUM:  Normal heart size. No pericardial effusion. No acute vascular abnormality. No adenopathy.  LUNG WINDOWS:  Suspect mild emphysema. No consolidation. No effusion. No suspicious pulmonary nodule.  UPPER ABDOMEN:  No evidence of metastatic disease  OSSEOUS:  No acute fracture.  No suspicious lytic or blastic lesions.  IMPRESSION: 1. 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 The mass causes moderate to advanced airway narrowing. For staging, the tumor involves the tongue base, left glossotonsillar sulcus, lingual epiglottis and likely preepiglottic fat. 2. Bilateral cervical adenopathy, levels noted above. A lateral left retropharyngeal lymph node is also suspicious due to asymmetry. 3. No evidence of thoracic metastatic disease.   Electronically Signed   By: Monte Fantasia M.D.   On: 08/08/2014 12:42   Ir Gastrostomy Tube Mod Sed  08/24/2014   CLINICAL DATA:  Lingual tonsil mass.  Tracheostomy.  EXAM: PERCUTANEOUS GASTROSTOMY  FLUOROSCOPY TIME:  5 minutes and 18 seconds  MEDICATIONS AND MEDICAL HISTORY: Versed 2 mg, Fentanyl 100 mcg.  ANESTHESIA/SEDATION: Moderate sedation time: 40 minutes  CONTRAST:  10 cc Omnipaque 300  PROCEDURE: The procedure, risks, benefits, and alternatives were explained to the patient. Questions regarding the procedure were encouraged and answered. The patient understands and consents to the procedure.  The epigastrium was prepped with Betadine in a sterile fashion, and a sterile drape was applied covering the operative field. A sterile gown and sterile gloves were used for the procedure.  A 5-French orogastric tube is placed under fluoroscopic guidance. Scout imaging of the abdomen confirms contrast from a prior CT scan within the transverse colon.  0.5 mg glucagon was administered. The stomach was distended with gas. Under fluoroscopic guidance, an  18 gauge needle was inserted through the anterior wall of the stomach and into the lumen. It was removed over a T tack. This was repeated for a total of 3 T tacks in a triangular configuration. The 18 gauge needle was advanced into the lumen of the stomach between the T tacks and removed over an Amplatz. The 14 [place] Malik rod gastrostomy tube was inserted.  It was a medially noted that there is latex along the gastrostomy tube. This was quickly cut and exchanged over a new Amplatz for a  14 French pigtail catheter. The patient remained hemodynamically stable, without breathing difficulty, and without rash. She reportedly has a latex allergy characterize by rash.  Contrast was injected into the existing gastrostomy tube.  COMPLICATIONS: Inadvertent utilization of a gastrostomy tube which contains latex. This was quickly exchanged. The patient suffered no adverse affect.  FINDINGS: The image demonstrates placement of a 20-French pull-through type gastrostomy tube into the body of the stomach.  IMPRESSION: Successful 14 French antegrade gastrostomy tube. The T tacks can be removed in 1-2 weeks. The tube can be up sized to a 20 [place] balloon retention tube in 6 weeks.   Electronically Signed   By: Marybelle Killings M.D.   On: 08/24/2014 09:44   Dg Chest Port 1 View  08/21/2014   CLINICAL DATA:  Tracheostomy evaluation.  EXAM: PORTABLE CHEST - 1 VIEW  COMPARISON:  08/20/2014.  FINDINGS: Tracheostomy good position. Low lung volumes with bibasilar subsegmental atelectasis and trace effusions. Nasogastric tube tip lies below the diaphragm along the greater curvature of the stomach. No pneumothorax.  IMPRESSION: Lung volumes are diminished compared with priors. No definite active infiltrates.   Electronically Signed   By: Rolla Flatten M.D.   On: 08/21/2014 07:44   Dg Chest Port 1 View  08/20/2014   CLINICAL DATA:  Tracheostomy placement  EXAM: PORTABLE CHEST - 1 VIEW  COMPARISON:  Chest radiograph June 30, 2014 and  chest CT August 08, 2014  FINDINGS: Tracheostomy catheter tip is 5.6 cm above the carina. Nasogastric tube tip and side port are in the stomach. No pneumothorax. Lungs are clear. Heart size and pulmonary vascularity are normal. No adenopathy. No bone lesions.  IMPRESSION: Tracheostomy as described without pneumothorax.  Lungs clear.   Electronically Signed   By: Lowella Grip III M.D.   On: 08/20/2014 14:35   Dg Abd Portable 1v  08/22/2014   CLINICAL DATA:  NG tube placement.  EXAM: PORTABLE ABDOMEN - 1 VIEW  COMPARISON:  08/21/2014  FINDINGS: Enteric tube appears unchanged in position with tip localized over the upper stomach and proximal side hole just below the EG junction. Contrast material is present in the right colon. Stool-filled colon without abnormal distention. No small bowel distention.  IMPRESSION: Enteric tube tip appears to be in the upper stomach.   Electronically Signed   By: Lucienne Capers M.D.   On: 08/22/2014 21:02   Dg Abd Portable 1v  08/22/2014   CLINICAL DATA:  NG tube placement.  EXAM: PORTABLE ABDOMEN - 1 VIEW  COMPARISON:  Abdominal radiograph earlier this day.  FINDINGS: Imaging focus down the upper abdomen and lower chest for NG tube placement. The enteric tube has been advanced, side-port now just beyond the gastroesophageal junction. Endotracheal tube in place, 5.3 cm from the carina. Bibasilar opacities, may reflect atelectasis, pneumonia, or aspiration.  IMPRESSION: Tip and side port of the enteric tube below the diaphragm in the stomach.   Electronically Signed   By: Jeb Levering M.D.   On: 08/22/2014 00:00   Dg Abd Portable 1v  08/21/2014   CLINICAL DATA:  NG tube placement.  EXAM: PORTABLE ABDOMEN - 1 VIEW  COMPARISON:  08/20/2014  FINDINGS: Nonobstructive bowel gas pattern. Moderate stool throughout the colon. No organomegaly or suspicious calcification. NG tube tip is in the distal esophagus near the GE junction.  IMPRESSION: NG tube tip in the distal  esophagus near the GE junction.   Electronically Signed   By: Rolm Baptise M.D.   On: 08/21/2014  18:54   Dg Abd Portable 1v  08/20/2014   CLINICAL DATA:  Nasogastric tube placement  EXAM: PORTABLE ABDOMEN - 1 VIEW  COMPARISON:  None.  FINDINGS: Nasogastric tube tip and side port are in the stomach. There is stool throughout the colon. There is an overall paucity of gas. No free air. Lung bases are clear.  IMPRESSION: Nasogastric tube tip and side port in stomach. Paucity of gas, a finding that may be normal but a finding that also may be indicative of enteritis or early ileus. No free air.   Electronically Signed   By: Lowella Grip III M.D.   On: 08/20/2014 14:33   Dg Swallowing Func-speech Pathology  08/27/2014   Assunta Curtis, CCC-SLP     08/27/2014  3:16 PM  Objective Swallowing Evaluation: Modified Barium Swallowing Study   Patient Details  Name: Shelby Larson MRN: [id number] Date of Birth: 05-May-1966  Today's Date: 08/27/2014 Time: SLP Start Time (ACUTE ONLY): 1345-SLP Stop Time (ACUTE  ONLY): 1414 SLP Time Calculation (min) (ACUTE ONLY): 29 min  Past Medical History:  Past Medical History  Diagnosis Date  . Seizures     takes Gabapentin  . Heart murmur     as a child  . Chronic bronchitis   . Anxiety     panic attacks  . Depression   . Headache     onset a few months ago  . Neuropathy     had it in both hands  . Arthritis   . Fibromyalgia   . Cancer     throat cancer  . Anemia   . Rosacea    Past Surgical History:  Past Surgical History  Procedure Laterality Date  . Wisdom tooth extraction    . Tonsillectomy Left 08/20/2014    Procedure: TONSILLECTOMY;  Surgeon: Ruby Cola, MD;   Location: [facility];  Service: ENT;  Laterality: Left;  . Radical neck dissection Left 08/20/2014    Procedure: RADICAL LEFT NECK DISSECTION ;  Surgeon: Ruby Cola, MD;  Location: [place];  Service: ENT;  Laterality: Left;  . Tracheostomy tube placement N/A 08/20/2014    Procedure: TRACHEOSTOMY - AWAKE;  Surgeon:  Ruby Cola, MD;   Location: [facility] OR;  Service: ENT;  Laterality: N/A;   HPI:  HPI: 49 y.o female presented to the ED on 2/13 with hemoptysis  and left neck mass.  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  Pt admitted 2/25 for tracheotomy,   biopsy left tongue base/oropharynx mass, left selective neck  dissection.  Has #6 cuffed trach.  Plan is for radiotherapy with  chemotherapy; PEG 2/29.   Initial MBS 2/27 moderate dysphagia  attributable to lingual/pharyngeal mass, post-surgical effects,  and presence of open trach (unable to safely use PMV at that  time).  Pt demonstrated silent aspiration of thin liquids and  poor propulsion of solids through pharynx.  Now with #6 cuffless  trach; tolerating PMV.    No Data Recorded  Assessment / Plan / Recommendation CHL IP CLINICAL IMPRESSIONS 08/27/2014  Dysphagia Diagnosis Mild pharyngeal phase dysphagia  Clinical impression Pt presents with a mild pharyngeal dysphagia,  improved since 2/27 study.  Continues with trach - #6 cuffless -  but now able to tolerate PMV and used valve throughout study.    Notable was reduced hyolaryngeal mobility and the appearance of  reduced pharyngeal space. There continues to be vallecular  residue post-swallow, but pt demonstrates improved awareness and  improved ability to propel material through pharynx and UES.  Limited, trace thin-liquid aspiration (just below level of vocal  folds and of lesser amount than noted during prior MBS) occurred  during the swallow on one occasion.  All other thin liquid  boluses were consumed safely.   Recommend advancing diet to  dysphagia 3, thin liquids with PMV in place for all PO  consumption.  Pt should follow safety precautions with POs.  Rec  SLP f/u at SNF for education/tx dysphagia associated with head  and neck cancer and radiation tx.       CHL IP TREATMENT RECOMMENDATION 08/27/2014  Treatment Plan Recommendations Therapy as outlined in treatment  plan below     CHL  IP DIET RECOMMENDATION 08/27/2014  Diet Recommendations Dysphagia 3 (Mechanical Soft);Thin liquid  Liquid Administration via Straw;Cup  Medication Administration Crushed with puree  Compensations Clear throat intermittently;Slow rate;Small  sips/bites  Postural Changes and/or Swallow Maneuvers Seated upright 90  degrees     CHL IP OTHER RECOMMENDATIONS 08/27/2014  Recommended Consults (None)  Oral Care Recommendations Oral care BID  Other Recommendations (None)     CHL IP FOLLOW UP RECOMMENDATIONS 08/27/2014  Follow up Recommendations Skilled Nursing facility     [facility] IP FREQUENCY AND DURATION 08/27/2014  Speech Therapy Frequency (ACUTE ONLY) min 3x week  Treatment Duration 1 week        SLP Swallow Goals No flowsheet data found.  No flowsheet data found.    CHL IP REASON FOR REFERRAL 08/27/2014  Reason for Referral Objectively evaluate swallowing function     CHL IP ORAL PHASE 08/27/2014  Lips (None)  Tongue (None)  Mucous membranes (None)  Nutritional status (None)  Other (None)  Oxygen therapy (None)  Oral Phase WFL  Oral - Pudding Teaspoon (None)  Oral - Pudding Cup (None)  Oral - Honey Teaspoon (None)  Oral - Honey Cup (None)  Oral - Honey Syringe (None)  Oral - Nectar Teaspoon (None)  Oral - Nectar Cup (None)  Oral - Nectar Straw (None)  Oral - Nectar Syringe (None)  Oral - Ice Chips (None)  Oral - Thin Teaspoon (None)  Oral - Thin Cup (None)  Oral - Thin Straw (None)  Oral - Thin Syringe (None)  Oral - Puree (None)  Oral - Mechanical Soft (None)  Oral - Regular (None)  Oral - Multi-consistency (None)  Oral - Pill (None)  Oral Phase - Comment (None)      CHL IP PHARYNGEAL PHASE 08/27/2014  Pharyngeal Phase Impaired  Pharyngeal - Pudding Teaspoon (None)  Penetration/Aspiration details (pudding teaspoon) (None)  Pharyngeal - Pudding Cup (None)  Penetration/Aspiration details (pudding cup) (None)  Pharyngeal - Honey Teaspoon (None)  Penetration/Aspiration details (honey teaspoon) (None)  Pharyngeal - Honey Cup (None)   Penetration/Aspiration details (honey cup) (None)  Pharyngeal - Honey Syringe (None)  Penetration/Aspiration details (honey syringe) (None)  Pharyngeal - Nectar Teaspoon (None)  Penetration/Aspiration details (nectar teaspoon) (None)  Pharyngeal - Nectar Cup (None)  Penetration/Aspiration details (nectar cup) (None)  Pharyngeal - Nectar Straw NT  Penetration/Aspiration details (nectar straw) (None)  Pharyngeal - Nectar Syringe (None)  Penetration/Aspiration details (nectar syringe) (None)  Pharyngeal - Ice Chips (None)  Penetration/Aspiration details (ice chips) (None)  Pharyngeal - Thin Teaspoon (None)  Penetration/Aspiration details (thin teaspoon) (None)  Pharyngeal - Thin Cup (None)  Penetration/Aspiration details (thin cup) (None)  Pharyngeal - Thin Straw Reduced airway/laryngeal closure;Reduced  anterior laryngeal mobility;Reduced epiglottic inversion;Trace  aspiration;Pharyngeal residue - valleculae  Penetration/Aspiration details (thin  straw) Material enters  airway, passes BELOW cords without attempt by patient to eject  out (silent aspiration)  Pharyngeal - Thin Syringe (None)  Penetration/Aspiration details (thin syringe') (None)  Pharyngeal - Puree Reduced epiglottic inversion;Reduced anterior  laryngeal mobility;Reduced laryngeal elevation;Pharyngeal residue  - valleculae  Penetration/Aspiration details (puree) (None)  Pharyngeal - Mechanical Soft Reduced epiglottic inversion;Reduced  anterior laryngeal mobility;Reduced laryngeal  elevation;Pharyngeal residue - valleculae  Penetration/Aspiration details (mechanical soft) (None)  Pharyngeal - Regular (None)  Penetration/Aspiration details (regular) (None)  Pharyngeal - Multi-consistency (None)  Penetration/Aspiration details (multi-consistency) (None)  Pharyngeal - Pill (None)  Penetration/Aspiration details (pill) (None)  Pharyngeal Comment (None)     No flowsheet data found.  No flowsheet data found.         Juan Quam Laurice 08/27/2014, 3:14 PM      Dg Swallowing Func-speech Pathology  08/22/2014   Assunta Curtis, [place]     08/22/2014  3:45 PM  Objective Swallowing Evaluation: Modified Barium Swallowing Study   Patient Details  Name: Shelby Larson MRN: [id number] Date of Birth: [birth year]  Today's Date: 08/22/2014 Time: SLP Start Time (ACUTE ONLY): 1230-SLP Stop Time (ACUTE  ONLY): 1300 SLP Time Calculation (min) (ACUTE ONLY): 30 min  Past Medical History:  Past Medical History  Diagnosis Date  . Seizures     takes Gabapentin  . Heart murmur     as a child  . Chronic bronchitis   . Anxiety     panic attacks  . Depression   . Headache     onset a few months ago  . Neuropathy     had it in both hands  . Arthritis   . Fibromyalgia   . Cancer     throat cancer  . Anemia   . Rosacea    Past Surgical History:  Past Surgical History  Procedure Laterality Date  . Wisdom tooth extraction    . Tonsillectomy Left 08/20/2014    Procedure: TONSILLECTOMY;  Surgeon: Ruby Cola, MD;   Location: [facility];  Service: ENT;  Laterality: Left;  . Radical neck dissection Left 08/20/2014    Procedure: RADICAL LEFT NECK DISSECTION ;  Surgeon: Ruby Cola, MD;  Location: [facility];  Service: ENT;  Laterality: Left;  . Tracheostomy tube placement N/A 08/20/2014    Procedure: TRACHEOSTOMY - AWAKE;  Surgeon: Ruby Cola, MD;   Location: [facility] OR;  Service: ENT;  Laterality: N/A;   HPI:  HPI: 49 y.o female presented to the ED on 2/13 with hemoptysis  and left neck mass.  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  Pt admitted 2/25 for tracheotomy,   biopsy left tongue base/oropharynx mass, left selective neck  dissection.  Has #6 cuffed trach.  Plan is for radiotherapy with  chemotherapy; PEG 2/29.    No Data Recorded  Assessment / Plan / Recommendation CHL IP CLINICAL IMPRESSIONS 08/22/2014  Dysphagia Diagnosis Moderate pharyngeal phase dysphagia  Clinical impression Pt presents with a moderate dysphagia  attributable to lingual/pharyngeal mass,  post-surgical effects,  and presence of open trach (unable to safely use PMV at this  time). Pt groggy for study, requiring frequent stimulation to  remain awake; when awake, she was anxious.  Notable was reduced  hyolaryngeal mobility and the appearance of reduced pharyngeal  space.  Repetitive swallows were required in order to propel  purees through pharynx and UES.  Trace aspiration of thin liquids  occurred during and after the swallow; pt without  awareness of  aspirate.  Limited nectar-thick liquids were consumed without  penetration/aspiration.   Pt is scheduled for trach downsize and  PEG 2/29.  Smaller trach should allow better tolerance of PMV and  may allow more functional eating.  (Per ENT report, "true vocal  folds are visualized and are pushed to the patient's right but  are mobile with no obvious masses and intact adduction and  abduction bilaterally.")   For now, recommend continuing ice  chips, allowing purees and nectar-thick liquids per pt's request.   SLP will follow.  Pt will need OPSLP to address mobility of  swallowing musculature prior to onset of radiation treatment.       CHL IP TREATMENT RECOMMENDATION 08/22/2014  Treatment Plan Recommendations Therapy as outlined in treatment  plan below     CHL IP DIET RECOMMENDATION 08/22/2014  Diet Recommendations Ice chips PRN after oral care;Dysphagia 1  (Puree);Nectar-thick liquid  Liquid Administration via Straw;Cup  Medication Administration Crushed with puree  Compensations (None)  Postural Changes and/or Swallow Maneuvers (None)     CHL IP OTHER RECOMMENDATIONS 08/22/2014  Recommended Consults (None)  Oral Care Recommendations Oral care Q4 per protocol  Other Recommendations Order thickener from pharmacy     CHL IP FOLLOW UP RECOMMENDATIONS 08/22/2014  Follow up Recommendations Outpatient SLP     CHL IP FREQUENCY AND DURATION 08/22/2014  Speech Therapy Frequency (ACUTE ONLY) min 3x week  Treatment Duration 1 week         SLP Swallow Goals No  flowsheet data found.  No flowsheet data found.    CHL IP REASON FOR REFERRAL 08/22/2014  Reason for Referral Objectively evaluate swallowing function     CHL IP ORAL PHASE 08/22/2014  Lips (None) Tongue (None)  Mucous membranes (None)  Nutritional status (None)  Other (None)  Oxygen therapy (None)  Oral Phase WFL  Oral - Pudding Teaspoon (None)  Oral - Pudding Cup (None)  Oral - Honey Teaspoon (None)  Oral - Honey Cup (None)  Oral - Honey Syringe (None)  Oral - Nectar Teaspoon (None)  Oral - Nectar Cup (None)  Oral - Nectar Straw (None)  Oral - Nectar Syringe (None)  Oral - Ice Chips (None)  Oral - Thin Teaspoon (None)  Oral - Thin Cup (None)  Oral - Thin Straw (None)  Oral - Thin Syringe (None)  Oral - Puree (None)  Oral - Mechanical Soft (None)  Oral - Regular (None)  Oral - Multi-consistency (None)  Oral - Pill (None)  Oral Phase - Comment (None)      CHL IP PHARYNGEAL PHASE 08/22/2014  Pharyngeal Phase Impaired  Pharyngeal - Pudding Teaspoon (None)  Penetration/Aspiration details (pudding teaspoon) (None)  Pharyngeal - Pudding Cup (None)  Penetration/Aspiration details (pudding cup) (None)  Pharyngeal - Honey Teaspoon (None)  Penetration/Aspiration details (honey teaspoon) (None)  Pharyngeal - Honey Cup (None)  Penetration/Aspiration details (honey cup) (None)  Pharyngeal - Honey Syringe (None)  Penetration/Aspiration details (honey syringe) (None)  Pharyngeal - Nectar Teaspoon (None)  Penetration/Aspiration details (nectar teaspoon) (None)  Pharyngeal - Nectar Cup (None)  Penetration/Aspiration details (nectar cup) (None)  Pharyngeal - Nectar Straw Reduced epiglottic inversion;Reduced  anterior laryngeal mobility;Reduced laryngeal elevation  Penetration/Aspiration details (nectar straw) (None)  Pharyngeal - Nectar Syringe (None)  Penetration/Aspiration details (nectar syringe) (None)  Pharyngeal - Ice Chips (None)  Penetration/Aspiration details (ice chips) (None)  Pharyngeal - Thin Teaspoon (None)   Penetration/Aspiration details (thin teaspoon) (None)  Pharyngeal - Thin Cup (None)  Penetration/Aspiration details (thin  cup) (None)  Pharyngeal - Thin Straw Reduced epiglottic inversion;Reduced  anterior laryngeal mobility;Reduced laryngeal elevation;Reduced  airway/laryngeal closure;Penetration/Aspiration after  swallow;Penetration/Aspiration during swallow;Trace aspiration  Penetration/Aspiration details (thin straw) Material enters  airway, passes BELOW cords without attempt by patient to eject  out (silent aspiration)  Pharyngeal - Thin Syringe (None)  Penetration/Aspiration details (thin syringe') (None)  Pharyngeal - Puree Reduced epiglottic inversion;Reduced anterior  laryngeal mobility;Reduced laryngeal elevation  Penetration/Aspiration details (puree) (None)  Pharyngeal - Mechanical Soft (None)  Penetration/Aspiration details (mechanical soft) (None)  Pharyngeal - Regular (None)  Penetration/Aspiration details (regular) (None)  Pharyngeal - Multi-consistency (None)  Penetration/Aspiration details (multi-consistency) (None)  Pharyngeal - Pill (None)  Penetration/Aspiration details (pill) (None)  Pharyngeal Comment (None)     No flowsheet data found.  No flowsheet data found.        Amanda L. 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 The patient is a good candidate for radiotherapy.  Plan is as below:   1) The patient will meet with med/onc to discuss chemotherapy - anticipate concurrent ChRT if Dr Whitney Muse deems her a satisfactory candidate for chemotherapy (appt 3-21)  1a) PET will be ordered to complete staging. If this revealed distant metastases, it could change our recommendations, as discussed with the patient   2) Referral will be made to dentistry for dental  evaluation/extractions in preparation for radiation in the vicinity of the mouth   3) Refer to home health for PEG, trach support and to see nutritionist for nutrition support; anticipate d/c from [facility] care [facility].  4) sucralfate for sore throat Rx today  5) appreciate Abby Potash Hock's help from social work  6) Will refer to outpatient swallowing therapy for evaluation and prophylactic treatment as needed for dysphagia, which can worsen during or after chemoradiotherapy.   7) Simulation / radiotherapy at [facility] in [place], close to pt's home, per her preference. Anticipate 7 weeks of RT - 70 Gy in 35 fractions.   8) Baseline TSH lab due to risk of hypothyroidism from RT.  Baseline CBC, BMP.  9) Ativan for anxiety r/t wearing radiotherapy mask  10) Applauded for quitting smoking and ETOH  It was a pleasure meeting the patient today. We discussed the risks, benefits, and side effects of radiotherapy.   We talked in detail about acute and late effects. She understands that some of the most bothersome acute effects will be significant soreness of the mouth and throat, changes in taste, changes in salivary function, skin irritation, hair loss, dehydration, weight loss and fatigue. We talked about late effects which include but are not necessarily limited to dysphagia, hypothyroidism, dry mouth, neck edema and nerve or spinal cord injury. No guarantees of treatment were given. A consent form was signed and placed in the patient's medical record. The patient is enthusiastic about proceeding with treatment. I look forward to participating in the patient's care.   __________________________________________   Eppie Gibson, MD

## 2014-09-02 NOTE — Progress Notes (Signed)
Please see the Nurse Progress Note in the MD Initial Consult Encounter for this patient. 

## 2014-09-02 NOTE — Telephone Encounter (Signed)
PRINTED AND MAILED RECORDS TO PATIENT.

## 2014-09-03 ENCOUNTER — Telehealth: Payer: Self-pay | Admitting: *Deleted

## 2014-09-03 NOTE — Telephone Encounter (Signed)
CALLED [facility] AND SPOKE WITH Markea Cerney'S NURSE AND INFORMED HER OF DENTAL APPT. FOR 09-04-14 @ 9 AM AND HER PET SCAN ON 09-07-14- ARRIVAL TIME - 12:45 PM, SPOKE WITH PATIENT'S NURSE AND SHE IS AWARE OF THESE APPTS.

## 2014-09-03 NOTE — Telephone Encounter (Signed)
1. Patient called to report she had appt scheduled with Dental Medicine tomorrow at 9:00.   I confirmed her understanding of the location of Dr. Ritta Slot office. 2. She asked about scheduling of PET scan.  I noted it was scheduled for Monday, 09/07/14 at 1:00.    I explained the location of [facility], that she is to arrive at 12:45.  I explained that she is to be NPO for 6 hours prior to procedure.  She verbalized understanding of information provided.  Gayleen Orem, RN, BSN, [place] at [place] [phone]

## 2014-09-04 ENCOUNTER — Encounter (HOSPITAL_COMMUNITY): Payer: Self-pay | Admitting: Dentistry

## 2014-09-04 ENCOUNTER — Encounter: Payer: Self-pay | Admitting: *Deleted

## 2014-09-04 ENCOUNTER — Telehealth: Payer: Self-pay | Admitting: *Deleted

## 2014-09-04 ENCOUNTER — Ambulatory Visit (HOSPITAL_COMMUNITY): Payer: Self-pay | Admitting: Dentistry

## 2014-09-04 VITALS — BP 84/58 | HR 88 | Temp 98.5°F

## 2014-09-04 DIAGNOSIS — K0530 Chronic periodontitis, unspecified: Secondary | ICD-10-CM

## 2014-09-04 DIAGNOSIS — IMO0002 Reserved for concepts with insufficient information to code with codable children: Secondary | ICD-10-CM

## 2014-09-04 DIAGNOSIS — C01 Malignant neoplasm of base of tongue: Secondary | ICD-10-CM

## 2014-09-04 DIAGNOSIS — K08409 Partial loss of teeth, unspecified cause, unspecified class: Secondary | ICD-10-CM

## 2014-09-04 DIAGNOSIS — Z01818 Encounter for other preprocedural examination: Secondary | ICD-10-CM

## 2014-09-04 DIAGNOSIS — K036 Deposits [accretions] on teeth: Secondary | ICD-10-CM

## 2014-09-04 DIAGNOSIS — M270 Developmental disorders of jaws: Secondary | ICD-10-CM

## 2014-09-04 NOTE — Telephone Encounter (Signed)
Patient called, asked for directions to Dental Medicine.  She verbalized understanding of my guidance.  Gayleen Orem, RN, BSN, [place] at [place] [phone]

## 2014-09-04 NOTE — Patient Instructions (Signed)

## 2014-09-04 NOTE — Progress Notes (Signed)
[facility] [place] Psychosocial Distress Screening Clinical Social Work  Clinical Social Work was referred by distress screening protocol.  The patient scored a 5 on the Psychosocial Distress Thermometer which indicates moderate distress. Clinical Social Worker phoned pt to assess for distress and other psychosocial needs. CSW had to leave message for pt introducing role of CSW. CSW will attempt to meet with pt at future appointments to address concerns as indicated below.   ONCBCN DISTRESS SCREENING 09/02/2014  Screening Type Initial Screening  Distress experienced in past week (1-10) 5  Emotional problem type Depression;Nervousness/Anxiety;Feeling hopeless;Adjusting to appearance changes  Information Concerns Type Lack of info about diagnosis;Lack of info about treatment;Lack of info about complementary therapy choices  Physical Problem type Pain;Sleep/insomnia  Other "pain" listed as most distressing, may contact by phone    Clinical Social Worker follow up needed: Yes.    If yes, follow up plan: See above Loren Racer, [place] Worker [place]  [facility] Phone: [phone] Fax: [phone]

## 2014-09-04 NOTE — Progress Notes (Signed)
DENTAL CONSULTATION  Date of Consultation:  09/04/2014 Patient Name:   Shelby Larson Date of Birth:   1966/03/19 Medical Record Number: [id number]  VITALS: BP 84/58 mmHg  Pulse 88  Temp(Src) 98.5 F (36.9 C) (Oral)  CHIEF COMPLAINT: Patient referred by [name] for a dental consultation.   HPI: Shelby Larson is a 49 year old female recently diagnosed with squamous cell carcinoma of the base of tongue. Patient is status post left tonsillectomy, left neck dissection, tracheostomy on 08/20/14.  Patient with anticipated radiation therapy and possible chemotherapy. Patient now seen as part of a medically necessary pre-chemoradiation therapy dental protocol examination.  The patient currently denies acute toothaches, swellings, or abscesses. The patient was last seen in May 2015 for an exam and restoration of a chipped front tooth. This was in [place], [place] with Dr. Abel Presto. Patient had been seeing a dentist regularly in [place], [place] from 2012 through 2015. Patient then moved back to [place], [place] in early 2015. Patient has a history of previous periodontal therapy. She has a history of orthodontic therapy from ages 12 through 42.  Patient had her wisdom teeth extracted at age 22.  PROBLEM LIST: Patient Active Problem List   Diagnosis Date Noted  . Malignant neoplasm of base of tongue 09/02/2014    Priority: High  . 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 Seizures     takes Gabapentin  . Heart murmur     as a child  . Chronic bronchitis   . Anxiety     panic attacks  . Depression   . Headache     onset a few months ago  . Neuropathy     had it in both hands  . Arthritis   . Fibromyalgia   . 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 Anemia   . Rosacea   . Hypercholesterolemia     PSH: Past Surgical History  Procedure Laterality Date  . Wisdom tooth  extraction    . Tonsillectomy Left 08/20/2014    Procedure: TONSILLECTOMY;  Surgeon: Ruby Cola, MD;  Location: [facility];  Service: ENT;  Laterality: Left;  . Radical neck dissection Left 08/20/2014    Procedure: RADICAL LEFT NECK DISSECTION ;  Surgeon: Ruby Cola, MD;  Location: [facility];  Service: ENT;  Laterality: Left;  . Tracheostomy tube placement N/A 08/20/2014    Procedure: TRACHEOSTOMY - AWAKE;  Surgeon: Ruby Cola, MD;  Location: [facility];  Service: ENT;  Laterality: N/A;    ALLERGIES: Allergies  Allergen Reactions  . Bee Venom Swelling  . Penicillins Other (See Comments)    unknown  . Latex Swelling and Rash    MEDICATIONS: Current Outpatient Prescriptions  Medication Sig Dispense Refill  . Acetaminophen (CHLORASEPTIC SORE THROAT PO) Take 2 sprays by mouth daily as needed (sore throat).    Marland Kitchen acetaminophen (TYLENOL) 160 MG/5ML solution Take 10.2 mLs (325 mg total) by mouth every 4 (four) hours as needed for mild pain, moderate pain or headache. 473 mL 0  . acetaminophen-codeine 120-12 MG/5ML solution Take 10 mLs by mouth every 4 (four) hours as needed for moderate pain or severe pain (PO or per tube; take the tylenol with codeine OR the hydrocodone, not both). 500 mL 0  . Amino Acids-Protein Hydrolys (FEEDING SUPPLEMENT, PRO-STAT SUGAR FREE 64,) LIQD Place 30 mLs into feeding tube daily. 900 mL 0  .  bacitracin ointment Apply topically 3 (three) times daily. Apply to left neck incision TID x 3 weeks 120 g 2  . cyclobenzaprine (FLEXERIL) 10 MG tablet Take 10 mg by mouth 3 (three) times daily.    . fluticasone (FLONASE) 50 MCG/ACT nasal spray Place 2 sprays into both nostrils daily.    Marland Kitchen gabapentin (NEURONTIN) 600 MG tablet Take 600 mg by mouth 3 (three) times daily.    Marland Kitchen HYDROcodone-acetaminophen (NORCO/VICODIN) 5-325 MG per tablet Take 1 tablet by mouth every 6 (six) hours as needed for severe pain. 15 tablet 0  . lidocaine (XYLOCAINE) 2 % solution Use as directed 20 mLs in the  mouth or throat as needed for mouth pain. 100 mL 0  . LORazepam (ATIVAN) 0.5 MG tablet Take 1-2 tablets 20 minutes prior to wearing radiation mask, PRN anxiety 40 tablet 0  . MAGNESIUM PO Take 2 tablets by mouth daily.    . methocarbamol (ROBAXIN) 500 MG tablet Take 500 mg by mouth every 8 (eight) hours as needed for muscle spasms.    . Multiple Vitamin (MULTIVITAMIN WITH MINERALS) TABS tablet Take 1 tablet by mouth daily.    . naproxen sodium (ANAPROX) 220 MG tablet Take 440 mg by mouth daily as needed (pain).    . Nutritional Supplements (FEEDING SUPPLEMENT, JEVITY 1.2 CAL,) LIQD Place 480 mLs into feeding tube every 8 (eight) hours. 10080 mL PRN  . QUEtiapine (SEROQUEL) 50 MG tablet Take 50 mg by mouth daily as needed (sleep).    . sertraline (ZOLOFT) 100 MG tablet Take 100 mg by mouth daily.    . sucralfate (CARAFATE) 1 G tablet Dissolve 1 tablet in 10 mL H20 and swallow 30 min prior to meals and bedtime. 60 tablet 5  . tetrahydrozoline-zinc (VISINE-AC) 0.05-0.25 % ophthalmic solution Place 2 drops into both eyes daily as needed (dry eyes).     No current facility-administered medications for this visit.    LABS: Lab Results  Component Value Date   WBC 11.3* 09/02/2014   HGB 11.1* 09/02/2014   HCT 34.8 09/02/2014   MCV 92.6 09/02/2014   PLT 396 09/02/2014      Component Value Date/Time   NA 137 09/02/2014 1007   NA 135 08/21/2014 0242   K 4.5 09/02/2014 1007   K 4.0 08/21/2014 0242   CL 96 08/21/2014 0242   CO2 27 09/02/2014 1007   CO2 26 08/21/2014 0242   GLUCOSE 87 09/02/2014 1007   GLUCOSE 108* 08/21/2014 0242   BUN 10.7 09/02/2014 1007   BUN 5* 08/21/2014 0242   CREATININE 0.8 09/02/2014 1007   CREATININE 0.84 08/21/2014 0242   CALCIUM 10.1 09/02/2014 1007   CALCIUM 7.9* 08/21/2014 0242   GFRNONAA 81* 08/21/2014 0242   GFRAA >90 08/21/2014 0242   Lab Results  Component Value Date   INR 1.11 08/20/2014   No results found for: PTT  SOCIAL HISTORY: History    Social History  . Marital Status: Single    Spouse Name: N/A  . Number of Children: N/A  . Years of Education: N/A   Occupational History  . Not on file.   Social History Main Topics  . Smoking status: Former Smoker -- 0.50 packs/day for 20 years    Types: Cigarettes    Quit date: 06/18/2014  . Smokeless tobacco: Never Used  . Alcohol Use: Yes     Comment: occasional, 09/02/14 no longer using  . Drug Use: No  . Sexual Activity: Not on file   Other  Topics Concern  . Not on file   Social History Narrative    FAMILY HISTORY: Family History  Problem Relation Age of Onset  . Neuropathy Mother   . Hypertension Mother   . Alcoholism Father   . Alcoholism Sister     REVIEW OF SYSTEMS:  Reviewed with the patient and included in dental record.  DENTAL HISTORY: CHIEF COMPLAINT: Patient referred by [name] for a dental consultation.   HPI: KAILANY DINUNZIO is a 49 year old female recently diagnosed with squamous cell carcinoma of the base of tongue. Patient is status post left tonsillectomy, left neck dissection, tracheostomy on 08/20/14.  Patient with anticipated radiation therapy and possible chemotherapy. Patient now seen as part of a medically necessary pre-chemoradiation therapy dental protocol examination.  The patient currently denies acute toothaches, swellings, or abscesses. The patient was last seen in May 2015 for an exam and restoration of a chipped front tooth. This was in [place], [place] with Dr. Abel Presto. Patient had been seeing a dentist regularly and [place], [place] from 2012 through 2015. Patient then moved back to [place], [place] in early 2015. Patient has a history of previous periodontal therapy. She has a history of orthodontic therapy from ages 54 through 82.  Patient had her wisdom teeth extracted at age 5.  DENTAL EXAMINATION: GENERAL: The patient is a well-developed, slightly built female in no acute distress. HEAD AND NECK: Patient has  evidence of left neck dissection. Patient has tracheostomy in place. No obvious right neck lymphadenopathy. The patient denies acute TMJ symptoms. Maximum interincisal opening is measured at 45 mm. INTRAORAL EXAM: Patient has xerostomia. The patient has a small mandibular left lingual torus. I do not see any evidence of oral abscess formation. DENTITION: Patient is missing tooth numbers 1, 4, 13, 16, 17, 21, 28, and 32. Patient may have a retained root tip in the area of wisdom tooth #32. PERIODONTAL: Patient has chronic, advanced periodontal disease with plaque accumulations, generalized gingival recession, and incipient tooth mobility. There is moderate bone loss noted. DENTAL CARIES/SUBOPTIMAL RESTORATIONS: Dental caries are noted on the mesial of tooth #15.  ENDODONTIC: Patient currently denies acute pulpitis symptoms.  CROWN AND BRIDGE: There are no crown or bridge restorations. PROSTHODONTIC: Patient denies having partial dentures. OCCLUSION: Patient has had previous orthodontic therapy ages [age]. The occlusion is stable at this time.  RADIOGRAPHIC INTERPRETATION: An orthopantogram was taken today. This was supplemented with a full series of dental radiographs. There are multiple missing teeth. There is moderate horizontal and vertical bone loss noted. Multiple dental restorations are noted. Bilateral maxillary sinuses are noted in well aerated.  ASSESSMENTS: 1. Squamous cell carcinoma of the base of tongue 2. Anticipated radiation therapy and possible chemotherapy. 3. Pre-chemradiation therapy dental examination 4. Chronic periodontitis with bone loss 5. Gingival recession 6. Accretions 7. Tooth mobility 8. Multiple missing teeth 9. Status post orthodontic therapy 10. Stable occlusion 11. Mandibular left lingual torus-small   PLAN/RECOMMENDATIONS: 1. I discussed the risks, benefits, and complications of various treatment options with the patient in relationship to her medical and  dental conditions, anticipated radiation therapy and possible chemotherapy, and chemoradiation therapy side effects to include xerostomia, radiation caries, trismus, mucositis, taste changes, gum and jawbone changes, and risk for infection, bleeding, and osteoradionecrosis We discussed various treatment options to include no treatment, multiple extractions with alveoloplasty, pre-prosthetic surgery as indicated, periodontal therapy, dental restorations, root canal therapy, crown and bridge therapy, implant therapy, and replacement of missing teeth as indicated. We also  discussed fabrication of fluoride trays and scatter protection devices. Due to the extensive periodontal disease, I am recommending that patient consider extraction of tooth numbers 2, 14, 15, 18, and 31 depending upon final ports and doses anticipated by [name]. The patient is currently scheduled for PET scan on Monday afternoon. [name] will then review PET scan and provide updated anticipated ports and doses at that time. I will then be able to finalize a discussion with the patient concerning need for dental extractions with alveoloplasty, periodontal therapy, and other pre-prosthetic surgery as indicated in the operating room with general anesthesia. Patient did agree to proceed with impressions today for the fabrication of fluoride trays and scatter protection devices as indicated. A prescription for fluorishield will be provided in the future. A trismus device was fabricated with 26 sticks at 45 mm.   2. Discussion of findings with medical team and coordination of future medical and dental care as needed.  I spent in excess of 120 minutes during the conduct of this consultation and >50% of this time involved direct face-to-face encounter for counseling and/or coordination of the patient's care.    Lenn Cal, DDS

## 2014-09-05 NOTE — Progress Notes (Signed)
Met with patient during initial consult with [name].  She was accompanied by her mother and friend [place].  She is currently residing at [facility] s/p surgery. 1. Further introduced myself as their Navigator, explained my role as a member of the Care Team, provided contact information, encouraged them to contact me with questions/concerns as treatments/procedures begin. 2. Provided New Patient Information packet:  Contact information for physician and navigator  Advance Directive information ([place] blue pamphlet)  Fall Prevention Patient Safety Plan  Appointment Guideline  [facility] campus map with highlight of [place] 3. Provided introductory explanation of radiation treatment including SIM planning and fitting of head mask, showed them example of mask.   4. Patient is electing to be treated in [place] (Drs. Isidore Moos and [place]). They verbalized understanding of information provided.

## 2014-09-07 ENCOUNTER — Encounter: Payer: Self-pay | Admitting: Radiation Oncology

## 2014-09-07 ENCOUNTER — Ambulatory Visit (HOSPITAL_COMMUNITY): Payer: 59

## 2014-09-07 NOTE — Progress Notes (Signed)
Auth for PET/CT skull to mid thigh approved, [id number]

## 2014-09-08 ENCOUNTER — Telehealth: Payer: Self-pay | Admitting: *Deleted

## 2014-09-08 NOTE — Telephone Encounter (Signed)
Returned patient's VM questioning name of MD who placed her PEG tube.  She stated practitioner at [facility] noted insertion site was infected and recommended follow-up with physician.  She indicated she is taking an abx.  Patient stated she has follow-up appt with Dr. Simeon Craft tomorrow, will discuss with him.  Gayleen Orem, RN, BSN, [place] at [place] [phone]

## 2014-09-09 ENCOUNTER — Other Ambulatory Visit: Payer: Self-pay | Admitting: Radiation Oncology

## 2014-09-09 ENCOUNTER — Ambulatory Visit: Payer: 59

## 2014-09-09 ENCOUNTER — Ambulatory Visit: Payer: 59 | Admitting: Radiation Oncology

## 2014-09-09 ENCOUNTER — Ambulatory Visit (HOSPITAL_COMMUNITY)
Admission: RE | Admit: 2014-09-09 | Discharge: 2014-09-09 | Disposition: A | Discharge status: Home / Self Care | Payer: 59 | Source: Ambulatory Visit | Attending: Radiation Oncology | Admitting: Radiation Oncology

## 2014-09-09 DIAGNOSIS — C01 Malignant neoplasm of base of tongue: Secondary | ICD-10-CM

## 2014-09-09 DIAGNOSIS — R590 Localized enlarged lymph nodes: Secondary | ICD-10-CM

## 2014-09-09 LAB — GLUCOSE, CAPILLARY: GLUCOSE-CAPILLARY: 79 mg/dL (ref 70–99)

## 2014-09-09 MED ORDER — FLUDEOXYGLUCOSE F - 18 (FDG) INJECTION
6.5000 | Freq: Once | INTRAVENOUS | Status: AC | PRN
  Administered 2014-09-09: 6.5 via INTRAVENOUS

## 2014-09-10 ENCOUNTER — Telehealth: Payer: Self-pay | Admitting: *Deleted

## 2014-09-10 NOTE — Telephone Encounter (Signed)
Called patient to ask about going to get her US biopsy today @ [facility], spoke with patient and she agreed to go.

## 2014-09-14 ENCOUNTER — Other Ambulatory Visit (HOSPITAL_COMMUNITY): Payer: Self-pay | Admitting: Dentistry

## 2014-09-14 ENCOUNTER — Encounter (HOSPITAL_COMMUNITY): Payer: 59 | Attending: Hematology & Oncology | Admitting: Hematology & Oncology

## 2014-09-14 VITALS — BP 123/71 | HR 100 | Temp 98.2°F | Resp 16 | Ht 65.25 in | Wt 122.6 lb

## 2014-09-14 DIAGNOSIS — Z87891 Personal history of nicotine dependence: Secondary | ICD-10-CM

## 2014-09-14 DIAGNOSIS — F1021 Alcohol dependence, in remission: Secondary | ICD-10-CM

## 2014-09-14 DIAGNOSIS — C01 Malignant neoplasm of base of tongue: Secondary | ICD-10-CM | POA: Diagnosis not present

## 2014-09-14 DIAGNOSIS — C140 Malignant neoplasm of pharynx, unspecified: Secondary | ICD-10-CM

## 2014-09-14 LAB — COMPREHENSIVE METABOLIC PANEL
ALBUMIN: 3.7 g/dL (ref 3.5–5.2)
ALK PHOS: 72 U/L (ref 39–117)
ALT: 10 U/L (ref 0–35)
AST: 20 U/L (ref 0–37)
Anion gap: 10 (ref 5–15)
BILIRUBIN TOTAL: 0.4 mg/dL (ref 0.3–1.2)
BUN: 17 mg/dL (ref 6–23)
CHLORIDE: 99 mmol/L (ref 96–112)
CO2: 25 mmol/L (ref 19–32)
Calcium: 9.9 mg/dL (ref 8.4–10.5)
Creatinine, Ser: 0.74 mg/dL (ref 0.50–1.10)
GFR calc Af Amer: >90 mL/min (ref >90)
GFR calc non Af Amer: >90 mL/min (ref >90)
Glucose, Bld: 76 mg/dL (ref 70–99)
Potassium: 4.2 mmol/L (ref 3.5–5.1)
SODIUM: 134 mmol/L — AB (ref 135–145)
Total Protein: 8.1 g/dL (ref 6.0–8.3)

## 2014-09-14 LAB — CBC WITH DIFFERENTIAL/PLATELET
BASOS ABS: 0 10*3/uL (ref 0.0–0.1)
Basophils Relative: 0 % (ref 0–1)
Eosinophils Absolute: 0.3 10*3/uL (ref 0.0–0.7)
Eosinophils Relative: 3 % (ref 0–5)
HCT: 33.9 % — ABNORMAL LOW (ref 36.0–46.0)
Hemoglobin: 11 g/dL — ABNORMAL LOW (ref 12.0–15.0)
Lymphocytes Relative: 16 % (ref 12–46)
Lymphs Abs: 1.8 10*3/uL (ref 0.7–4.0)
MCH: 29.3 pg (ref 26.0–34.0)
MCHC: 32.4 g/dL (ref 30.0–36.0)
MCV: 90.2 fL (ref 78.0–100.0)
MONO ABS: 0.8 10*3/uL (ref 0.1–1.0)
Monocytes Relative: 7 % (ref 3–12)
Neutro Abs: 8.3 10*3/uL — ABNORMAL HIGH (ref 1.7–7.7)
Neutrophils Relative %: 74 % (ref 43–77)
Platelets: 487 10*3/uL — ABNORMAL HIGH (ref 150–400)
RBC: 3.76 MIL/uL — ABNORMAL LOW (ref 3.87–5.11)
RDW: 15.2 % (ref 11.5–15.5)
WBC: 11.2 10*3/uL — ABNORMAL HIGH (ref 4.0–10.5)

## 2014-09-14 MED ORDER — PROCHLORPERAZINE MALEATE 10 MG PO TABS: 10.0000 mg | ORAL_TABLET | Freq: Four times a day (QID) | ORAL | Status: DC | PRN

## 2014-09-14 MED ORDER — ONDANSETRON 8 MG PO TBDP
8.0000 mg | ORAL_TABLET | Freq: Three times a day (TID) | ORAL | Status: DC | PRN
Start: 2014-09-14 — End: 2015-02-05

## 2014-09-14 MED ORDER — AFATINIB DIMALEATE 20 MG PO TABS: 20.0000 mg | ORAL_TABLET | Freq: Every day | ORAL | Status: DC

## 2014-09-14 MED ORDER — LIDOCAINE-PRILOCAINE 2.5-2.5 % EX CREA: TOPICAL_CREAM | CUTANEOUS | Status: DC

## 2014-09-14 NOTE — Patient Instructions (Addendum)
[place] at [facility] Discharge Instructions  RECOMMENDATIONS MADE BY THE CONSULTANT AND ANY TEST RESULTS WILL BE SENT TO YOUR REFERRING PHYSICIAN.  Exam and discussion by Dr. Whitney Muse Plans are to get port a catheter placed. Interventional radiology will contact you for scheduling. Chemotherapy in conjunction with radiation therapy.  Chemoteaching with Lupita Raider. Will be treating you with single agent Cisplatin.  You will get this every 21 days for 3 treatments.  Follow-up on Day 1 of Chemotherapy.  Thank you for choosing [place] at [facility] to provide your oncology and hematology care.  To afford each patient quality time with our provider, please arrive at least 15 minutes before your scheduled appointment time.    You need to re-schedule your appointment should you arrive 10 or more minutes late.  We strive to give you quality time with our providers, and arriving late affects you and other patients whose appointments are after yours.  Also, if you no show three or more times for appointments you may be dismissed from the clinic at the providers discretion.     Again, thank you for choosing [facility].  Our hope is that these requests will decrease the amount of time that you wait before being seen by our physicians.       _____________________________________________________________  Should you have questions after your visit to [facility], please contact our office at [phone] between the hours of 8:30 a.m. and 4:30 p.m.  Voicemails left after 4:30 p.m. will not be returned until the following business day.  For prescription refill requests, have your pharmacy contact our office.   Cisplatin injection What is this medicine? CISPLATIN (SIS pla tin) is a chemotherapy drug. It targets fast dividing cells, like cancer cells, and causes these cells to die. This medicine is used to treat many types of  cancer like bladder, ovarian, and testicular cancers. This medicine may be used for other purposes; ask your health care provider or pharmacist if you have questions. COMMON BRAND NAME(S): Platinol, Platinol -AQ What should I tell my health care provider before I take this medicine? They need to know if you have any of these conditions: -blood disorders -hearing problems -kidney disease -recent or ongoing radiation therapy -an unusual or allergic reaction to cisplatin, carboplatin, other chemotherapy, other medicines, foods, dyes, or preservatives -pregnant or trying to get pregnant -breast-feeding How should I use this medicine? This drug is given as an infusion into a vein. It is administered in a hospital or clinic by a specially trained health care professional. Talk to your pediatrician regarding the use of this medicine in children. Special care may be needed. Overdosage: If you think you have taken too much of this medicine contact a poison control center or emergency room at once. NOTE: This medicine is only for you. Do not share this medicine with others. What if I miss a dose? It is important not to miss a dose. Call your doctor or health care professional if you are unable to keep an appointment. What may interact with this medicine? -dofetilide -foscarnet -medicines for seizures -medicines to increase blood counts like filgrastim, pegfilgrastim, sargramostim -probenecid -pyridoxine used with altretamine -rituximab -some antibiotics like amikacin, gentamicin, neomycin, polymyxin B, streptomycin, tobramycin -sulfinpyrazone -vaccines -zalcitabine Talk to your doctor or health care professional before taking any of these medicines: -acetaminophen -aspirin -ibuprofen -ketoprofen -naproxen This list may not describe all possible interactions. Give your health  care provider a list of all the medicines, herbs, non-prescription drugs, or dietary supplements you use. Also tell  them if you smoke, drink alcohol, or use illegal drugs. Some items may interact with your medicine. What should I watch for while using this medicine? Your condition will be monitored carefully while you are receiving this medicine. You will need important blood work done while you are taking this medicine. This drug may make you feel generally unwell. This is not uncommon, as chemotherapy can affect healthy cells as well as cancer cells. Report any side effects. Continue your course of treatment even though you feel ill unless your doctor tells you to stop. In some cases, you may be given additional medicines to help with side effects. Follow all directions for their use. Call your doctor or health care professional for advice if you get a fever, chills or sore throat, or other symptoms of a cold or flu. Do not treat yourself. This drug decreases your body's ability to fight infections. Try to avoid being around people who are sick. This medicine may increase your risk to bruise or bleed. Call your doctor or health care professional if you notice any unusual bleeding. Be careful brushing and flossing your teeth or using a toothpick because you may get an infection or bleed more easily. If you have any dental work done, tell your dentist you are receiving this medicine. Avoid taking products that contain aspirin, acetaminophen, ibuprofen, naproxen, or ketoprofen unless instructed by your doctor. These medicines may hide a fever. Do not become pregnant while taking this medicine. Women should inform their doctor if they wish to become pregnant or think they might be pregnant. There is a potential for serious side effects to an unborn child. Talk to your health care professional or pharmacist for more information. Do not breast-feed an infant while taking this medicine. Drink fluids as directed while you are taking this medicine. This will help protect your kidneys. Call your doctor or health care  professional if you get diarrhea. Do not treat yourself. What side effects may I notice from receiving this medicine? Side effects that you should report to your doctor or health care professional as soon as possible: -allergic reactions like skin rash, itching or hives, swelling of the face, lips, or tongue -signs of infection - fever or chills, cough, sore throat, pain or difficulty passing urine -signs of decreased platelets or bleeding - bruising, pinpoint red spots on the skin, black, tarry stools, nosebleeds -signs of decreased red blood cells - unusually weak or tired, fainting spells, lightheadedness -breathing problems -changes in hearing -gout pain -low blood counts - This drug may decrease the number of white blood cells, red blood cells and platelets. You may be at increased risk for infections and bleeding. -nausea and vomiting -pain, swelling, redness or irritation at the injection site -pain, tingling, numbness in the hands or feet -problems with balance, movement -trouble passing urine or change in the amount of urine Side effects that usually do not require medical attention (report to your doctor or health care professional if they continue or are bothersome): -changes in vision -loss of appetite -metallic taste in the mouth or changes in taste This list may not describe all possible side effects. Call your doctor for medical advice about side effects. You may report side effects to FDA at 1-800-FDA-1088. Where should I keep my medicine? This drug is given in a hospital or clinic and will not be stored at home. NOTE: This  sheet is a summary. It may not cover all possible information. If you have questions about this medicine, talk to your doctor, pharmacist, or health care provider.  2015, [employer] Standard. (2007-09-17 14:40:54)

## 2014-09-14 NOTE — Progress Notes (Signed)
Shelby Larson presented for labwork. Labs per MD order drawn via Peripheral Line 23 gauge needle inserted in left forearm  Good blood return present. Procedure without incident.  Needle removed intact. Patient tolerated procedure well.

## 2014-09-14 NOTE — Progress Notes (Signed)
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 She has a long-standing history of tobacco and alcohol use. She presented with a growing left mass and also several episodes of hemoptysis. She underwent a CT scan of the neck and chest in early February which showed a large oropharyngeal tumor that caused advanced airway narrowing. Bilateral cervical adenopathy was also reported.  Biopsy of the tongue base was performed on 08/20/2014. She also underwent a left neck dissection. Tumor pathology was positive for moderately differentiated squamous cell carcinoma, unfortunately p 16 negative. 3 of 16 nodes were positive. The largest lymph node involved with disease was 3.4 cm.  She has seen radiation oncology in consultation. She has completed imaging. She has undergone a biopsy of an axillary node. Tracheostomy and PEG tube have already been placed.  She has found this area but has lived on the [place] for many years. She has worked in [employer]. She returned back to eating to help care for her mother. She notes soon after her return home she developed a left neck swelling and hemoptysis. Abdomen emotional perspective she said the heart is staying is going to be her dental extractions. She states her mood is  good some days and poor others but she does not feel she is chronically depressed.   MEDICAL HISTORY:  Past Medical History  Diagnosis Date  . Seizures     takes Gabapentin  . Heart murmur     as a child  . Chronic bronchitis   . Anxiety     panic attacks  . Depression   . Headache     onset a few months ago  . Neuropathy     had it in both hands  . Arthritis   . Fibromyalgia   . 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 Anemia   . Rosacea   . Hypercholesterolemia     SURGICAL HISTORY: Past Surgical History  Procedure Laterality Date  . Wisdom tooth extraction    . Tonsillectomy Left 08/20/2014    Procedure: TONSILLECTOMY;  Surgeon: Ruby Cola, MD;  Location: [facility];  Service: ENT;  Laterality: Left;  . Radical neck dissection Left 08/20/2014    Procedure: RADICAL LEFT NECK DISSECTION ;  Surgeon: Ruby Cola, MD;  Location: [place];  Service: ENT;  Laterality: Left;  . Tracheostomy tube placement N/A 08/20/2014    Procedure: TRACHEOSTOMY - AWAKE;  Surgeon: Ruby Cola, MD;  Location: [place];  Service: ENT;  Laterality: N/A;    SOCIAL HISTORY: History   Social History  . Marital Status: Single    Spouse Name: N/A  . Number of Children: N/A  . Years of Education: N/A   Occupational History  . Not on file.  Social History Main Topics  . Smoking status: Former Smoker -- 0.50 packs/day for 20 years    Types: Cigarettes    Quit date: 06/18/2014  . Smokeless tobacco: Never Used  . Alcohol Use: Yes     Comment: occasional, 09/02/14 no longer using  . Drug Use: No  . Sexual Activity: Not on file   Other Topics Concern  . Not on file   Social History Narrative   She is not married. [place] [employer] Has no children. She has a significant history of alcohol and tobacco use.  FAMILY HISTORY: Family History  Problem Relation Age of Onset  .  Neuropathy Mother   . Hypertension Mother   . Alcoholism Father   . Alcoholism Sister    indicated that her mother is alive. She indicated that her father is deceased. She indicated that her sister is alive.   ALLERGIES:  is allergic to bee venom; penicillins; and latex.  MEDICATIONS:  Current Outpatient Prescriptions  Medication Sig Dispense Refill  . Acetaminophen (CHLORASEPTIC SORE THROAT PO) Take 2 sprays by mouth daily as needed (sore throat).    Marland Kitchen acetaminophen (TYLENOL) 160 MG/5ML solution Take 10.2 mLs (325 mg total) by mouth every 4 (four) hours as needed for mild pain, moderate pain or headache. 473 mL 0  . acetaminophen-codeine 120-12 MG/5ML solution Take 10 mLs by mouth every 4 (four) hours as needed for moderate pain or severe pain (PO or per tube; take the tylenol with codeine OR the hydrocodone, not both). 500 mL 0  . Amino Acids-Protein Hydrolys (FEEDING SUPPLEMENT, PRO-STAT SUGAR FREE 64,) LIQD Place 30 mLs into feeding tube daily. 900 mL 0  . bacitracin ointment Apply topically 3 (three) times daily. Apply to left neck incision TID x 3 weeks 120 g 2  . cyclobenzaprine (FLEXERIL) 10 MG tablet Take 10 mg by mouth 3 (three) times daily.    . fluticasone (FLONASE) 50 MCG/ACT nasal spray Place 2 sprays into both nostrils daily.    Marland Kitchen gabapentin (NEURONTIN) 600 MG tablet Take 600 mg by mouth 3 (three) times daily.    Marland Kitchen HYDROcodone-acetaminophen (NORCO/VICODIN) 5-325 MG per tablet Take 1 tablet by mouth every 6 (six) hours as needed for severe pain. 15 tablet 0  . lidocaine (XYLOCAINE) 2 % solution Use as directed 20 mLs in the mouth or throat as needed for mouth pain. 100 mL 0  . LORazepam (ATIVAN) 0.5 MG tablet Take 1-2 tablets 20 minutes prior to wearing radiation mask, PRN anxiety 40 tablet 0  . MAGNESIUM PO Take 2 tablets by mouth daily.    . methocarbamol (ROBAXIN) 500 MG tablet Take 500 mg by mouth every 8 (eight) hours as needed for muscle spasms.    . Multiple Vitamin  (MULTIVITAMIN WITH MINERALS) TABS tablet Take 1 tablet by mouth daily.    . naproxen sodium (ANAPROX) 220 MG tablet Take 440 mg by mouth daily as needed (pain).    . Nutritional Supplements (FEEDING SUPPLEMENT, JEVITY 1.2 CAL,) LIQD Place 480 mLs into feeding tube every 8 (eight) hours. 10080 mL PRN  . QUEtiapine (SEROQUEL) 50 MG tablet Take 50 mg by mouth daily as needed (sleep).    . sertraline (ZOLOFT) 100 MG tablet Take 100 mg by mouth daily.    . sucralfate (CARAFATE) 1 G tablet Dissolve 1 tablet in 10 mL H20 and swallow 30 min prior to meals and bedtime.  60 tablet 5  . tetrahydrozoline-zinc (VISINE-AC) 0.05-0.25 % ophthalmic solution Place 2 drops into both eyes daily as needed (dry eyes).     No current facility-administered medications for this visit.    Review of Systems  Constitutional: Positive for malaise/fatigue.  HENT: Positive for ear pain.   Eyes: Negative.   Respiratory: Positive for hemoptysis.   Cardiovascular: Negative.   Gastrointestinal: Negative.   Genitourinary: Negative.   Musculoskeletal: Positive for joint pain.  Skin: Negative.   Neurological: Positive for headaches. Negative for dizziness, tingling, tremors, sensory change, speech change, focal weakness, seizures and loss of consciousness.  Endo/Heme/Allergies: Negative.   Psychiatric/Behavioral: Positive for substance abuse.       She has just quit drinking ETOH and smoking    PHYSICAL EXAMINATION: ECOG PERFORMANCE STATUS: 0 - Asymptomatic  There were no vitals filed for this visit. There were no vitals filed for this visit.   Physical Exam  Constitutional: She is oriented to person, place, and time and well-developed, well-nourished, and in no distress.  HENT:  Head: Normocephalic and atraumatic.  Nose: Nose normal.  Mouth/Throat: Oropharynx is clear and moist. No oropharyngeal exudate.  Eyes: Conjunctivae and EOM are normal. Pupils are equal, round, and reactive to light. Right eye exhibits no  discharge. Left eye exhibits no discharge. No scleral icterus.  Neck: Normal range of motion. Neck supple. No tracheal deviation present. No thyromegaly present.  Sutures along Left neck, well healing without obvious infection. Tracheostomy with collar  Cardiovascular: Normal rate, regular rhythm and normal heart sounds.  Exam reveals no gallop and no friction rub.   No murmur heard. Pulmonary/Chest: Effort normal. She has no wheezes. She has no rales.  Occasional wheeze  Abdominal: Soft. Bowel sounds are normal. She exhibits no distension and no mass. There is no tenderness. There is no rebound and no guarding.  Musculoskeletal: Normal range of motion. She exhibits no edema.  Lymphadenopathy:    She has no cervical adenopathy.  Neurological: She is alert and oriented to person, place, and time. She has normal reflexes. No cranial nerve deficit. Gait normal. Coordination normal.  Skin: Skin is warm and dry. No rash noted.  Psychiatric: Mood, memory, affect and judgment normal.  Nursing note and vitals reviewed.    LABORATORY DATA:  I have reviewed the data as listed Lab Results  Component Value Date   WBC 11.3* 09/02/2014   HGB 11.1* 09/02/2014   HCT 34.8 09/02/2014   MCV 92.6 09/02/2014   PLT 396 09/02/2014     Chemistry      Component Value Date/Time   NA 137 09/02/2014 1007   NA 135 08/21/2014 0242   K 4.5 09/02/2014 1007   K 4.0 08/21/2014 0242   CL 96 08/21/2014 0242   CO2 27 09/02/2014 1007   CO2 26 08/21/2014 0242   BUN 10.7 09/02/2014 1007   BUN 5* 08/21/2014 0242   CREATININE 0.8 09/02/2014 1007   CREATININE 0.84 08/21/2014 0242      Component Value Date/Time   CALCIUM 10.1 09/02/2014 1007   CALCIUM 7.9* 08/21/2014 0242   ALKPHOS 76 08/21/2014 0242   AST 27 08/21/2014 0242   ALT 12 08/21/2014 0242   BILITOT 1.1 08/21/2014 0242      PATHOLOGYGNOSIS Diagnosis 1. Tongue, biopsy, left base/ pharynx mass - INVASIVE MODERATELY DIFFERENTIATED SQUAMOUS CELL  CARCINOMA. - SEE COMMENT. 2. Lymph nodes, radical neck dissection, Left - THREE OF SIXTEEN LYMPH NODES POSITIVE FOR METASTATIC SQUAMOUS CELL CARCINOMA (3/16). - LARGEST  LYMPH NODE IS LARGELY REPLACED BY TUMOR, MEASURES 3.4 CM IN GREATEST DIMENSION AND SHOWS EVIDENCE OF EXTRACAPSULAR EXTENSION. Microscopic Comment 1. The tissue is received fragmented and consists of fragments of invasive squamous cell carcinoma. The tumor appears moderately differentiated and arises from the superficial benign squamous mucosal surface. Dr. Simeon Craft is contacted with the result. A p16 immunohistochemical stain will be performed on the tumor and reported in an addendum to follow (Per discussion with Dr. Simeon Craft he indicated this would be prudent to obtain). (RH:ecj 08/21/2014) Willeen Niece MD     RADIOGRAPHIC STUDIES: I have personally reviewed the radiological images as listed and agreed with the findings in the report.  CLINICAL DATA: Initial treatment strategy for base of tongue cancer.  EXAM: NUCLEAR MEDICINE PET SKULL BASE TO THIGH  IMPRESSION: 1. Intensely hypermetabolic circumferential oropharyngeal mass is identified. 2. Hypermetabolic bilateral cervical metastatic adenopathy as well as hypermetabolic left axillary adenopathy. 3. Sub solid nodule identified within the right upper lobe is nonspecific but exhibits malignant range FDG uptake. Attention of this nodule on follow-up examination is recommended. 4. Status post tracheostomy and gastrostomy tube placement  Electronically Signed: By: Kerby Moors M.D. On: 09/09/2014 16:06    ADDENDUM REPORT: 09/09/2014 17:01  ADDENDUM: The following is a correction to the body of the report:  CHEST  Hypermetabolic left axillary lymph node measures 1.1 cm and has an SUV max equal to 6.27. No  hypermetabolic mediastinal or hilar lymph nodes identified. There is a sub solid nodule identified within the right upper lobe measuring 1.6  cm, image 17/series 6. The SUV max associated this nodule is equal to 2.1.   Electronically Signed  By: Kerby Moors M.D.  On: 09/09/2014 17:01     ASSESSMENT & PLAN:   Malignant neoplasm of base of tongue   Staging form: Lip and Oral Cavity, AJCC 7th Edition     Clinical stage from 09/02/2014: Stage IVA (T2, N2c, M0) - Unsigned HPV negative History of Tobacco and alcohol abuse   She is going to undergo dental extraction and states this is the most emotionally difficult thing she still with [place] far. She has good days and bad days from an emotional perspective. We talked about the psychological aspects of dealing with the cancer diagnosis. We spent time discussing her alcohol and tobacco use and I have encouraged her that if she begins to struggle with this during therapy to please let us know. I will have her meet Hildred Alamin our patient navigator today. She does use Ensure but still takes most of her food by mouth. We will make sure she has appropriate nutrition consultation as well.  She has allergies seeing [name] in consultation and will be receiving radiation therapy.In accordance with NCCN guidelines I have recommended concurrent single agent cisplatin at 100 mg/m given 3 times on days 1, day 22, and day 43 of radiation. We will set her up for a chemotherapy class individual chemotherapy instruction. Advised her to write down all of her questions and bring them back and we will discuss them further at her next follow-up visit. We will provide her information at her next visit on how to deal with some of the side effects of her therapy.  I will need to obtain the pathology from the biopsy of her axillary lymph node.  All questions were answered. The patient knows to call the clinic with any problems, questions or concerns.  This note was electronically signed.    Molli Hazard, MD  09/14/2014 8:10  AM

## 2014-09-15 ENCOUNTER — Other Ambulatory Visit (HOSPITAL_COMMUNITY): Payer: Self-pay | Admitting: *Deleted

## 2014-09-15 ENCOUNTER — Encounter: Payer: Self-pay | Admitting: Radiation Oncology

## 2014-09-15 ENCOUNTER — Telehealth (HOSPITAL_COMMUNITY): Payer: Self-pay | Admitting: *Deleted

## 2014-09-15 DIAGNOSIS — C140 Malignant neoplasm of pharynx, unspecified: Secondary | ICD-10-CM

## 2014-09-15 DIAGNOSIS — B001 Herpesviral vesicular dermatitis: Secondary | ICD-10-CM | POA: Insufficient documentation

## 2014-09-15 MED ORDER — VALACYCLOVIR HCL 500 MG PO TABS: 500.0000 mg | ORAL_TABLET | Freq: Two times a day (BID) | ORAL | Status: DC

## 2014-09-15 MED ORDER — LEVOFLOXACIN 500 MG PO TABS: 500.0000 mg | ORAL_TABLET | Freq: Every day | ORAL | Status: DC

## 2014-09-15 NOTE — Telephone Encounter (Signed)
Valtrex for cold sores escribed, Levaquin for rt lymph node that is swollen in neck that pt felt and for patient continuing to have slightly increased WBC count escribed. Patient notified of meds that were called in. Regan Rakers tech took care of this phone call @ [employer]. Caryl Pina to fax the [employer] PA to [place] so that we can get this drug approved for patient.

## 2014-09-15 NOTE — Progress Notes (Signed)
I called [name] with the biopsy results from her axillary node. The tissue was fortunately benign.  -----------------------------------  Shelby Gibson, MD

## 2014-09-16 ENCOUNTER — Encounter (HOSPITAL_COMMUNITY)
Admission: RE | Admit: 2014-09-16 | Discharge: 2014-09-16 | Disposition: A | Discharge status: Home / Self Care | Payer: 59 | Source: Ambulatory Visit | Attending: Dentistry | Admitting: Dentistry

## 2014-09-16 ENCOUNTER — Encounter (HOSPITAL_COMMUNITY): Payer: Self-pay

## 2014-09-16 ENCOUNTER — Other Ambulatory Visit (HOSPITAL_COMMUNITY): Payer: Self-pay | Admitting: *Deleted

## 2014-09-16 DIAGNOSIS — J029 Acute pharyngitis, unspecified: Secondary | ICD-10-CM | POA: Diagnosis not present

## 2014-09-16 DIAGNOSIS — M797 Fibromyalgia: Secondary | ICD-10-CM | POA: Diagnosis not present

## 2014-09-16 DIAGNOSIS — M270 Developmental disorders of jaws: Secondary | ICD-10-CM | POA: Diagnosis not present

## 2014-09-16 DIAGNOSIS — F419 Anxiety disorder, unspecified: Secondary | ICD-10-CM | POA: Diagnosis not present

## 2014-09-16 DIAGNOSIS — K036 Deposits [accretions] on teeth: Secondary | ICD-10-CM | POA: Diagnosis not present

## 2014-09-16 DIAGNOSIS — J45909 Unspecified asthma, uncomplicated: Secondary | ICD-10-CM | POA: Diagnosis not present

## 2014-09-16 DIAGNOSIS — K5903 Drug induced constipation: Secondary | ICD-10-CM | POA: Insufficient documentation

## 2014-09-16 DIAGNOSIS — K088 Other specified disorders of teeth and supporting structures: Secondary | ICD-10-CM | POA: Diagnosis not present

## 2014-09-16 DIAGNOSIS — Z79891 Long term (current) use of opiate analgesic: Secondary | ICD-10-CM | POA: Diagnosis not present

## 2014-09-16 DIAGNOSIS — Z7952 Long term (current) use of systemic steroids: Secondary | ICD-10-CM | POA: Diagnosis not present

## 2014-09-16 DIAGNOSIS — T402X5A Adverse effect of other opioids, initial encounter: Principal | ICD-10-CM

## 2014-09-16 DIAGNOSIS — K0530 Chronic periodontitis, unspecified: Secondary | ICD-10-CM | POA: Diagnosis not present

## 2014-09-16 DIAGNOSIS — G629 Polyneuropathy, unspecified: Secondary | ICD-10-CM | POA: Diagnosis not present

## 2014-09-16 DIAGNOSIS — J449 Chronic obstructive pulmonary disease, unspecified: Secondary | ICD-10-CM | POA: Diagnosis not present

## 2014-09-16 DIAGNOSIS — Z792 Long term (current) use of antibiotics: Secondary | ICD-10-CM | POA: Diagnosis not present

## 2014-09-16 DIAGNOSIS — C01 Malignant neoplasm of base of tongue: Secondary | ICD-10-CM | POA: Diagnosis present

## 2014-09-16 DIAGNOSIS — Z87891 Personal history of nicotine dependence: Secondary | ICD-10-CM | POA: Diagnosis not present

## 2014-09-16 DIAGNOSIS — Z923 Personal history of irradiation: Secondary | ICD-10-CM | POA: Diagnosis not present

## 2014-09-16 DIAGNOSIS — M1990 Unspecified osteoarthritis, unspecified site: Secondary | ICD-10-CM | POA: Diagnosis not present

## 2014-09-16 DIAGNOSIS — Z7951 Long term (current) use of inhaled steroids: Secondary | ICD-10-CM | POA: Diagnosis not present

## 2014-09-16 HISTORY — DX: Chronic obstructive pulmonary disease, unspecified: J44.9

## 2014-09-16 HISTORY — DX: Unspecified asthma, uncomplicated: J45.909

## 2014-09-16 MED ORDER — NALOXEGOL OXALATE 25 MG PO TABS: 25.0000 mg | ORAL_TABLET | Freq: Every morning | ORAL | Status: DC

## 2014-09-16 MED ORDER — CLINDAMYCIN PHOSPHATE 600 MG/50ML IV SOLN
600.0000 mg | Freq: Once | INTRAVENOUS | Status: AC
  Administered 2014-09-17: 600 mg via INTRAVENOUS
  Filled 2014-09-16: qty 50

## 2014-09-16 NOTE — Pre-Procedure Instructions (Signed)
Shelby Larson  09/16/2014   Your procedure is scheduled on:  Thursday, September 17, 2014 at 7:30 AM.   Report to [facility] Entrance "A" Admitting Office at 5:30 AM.   Call this number if you have problems the morning of surgery: [phone]   Remember:   Do not eat food or drink liquids after midnight tonight.   Take these medicines the morning of surgery with A SIP OF WATER: Gabapentin (Neurontin), Sertraline (Zoloft), Valacyclovir (Valtrex), Zofran - if needed, Flexeril - if needed, eye drops if needed   Do not wear jewelry, make-up or nail polish.  Do not wear lotions, powders, or perfumes. You may wear deodorant.  Do not shave 48 hours prior to surgery.   Do not bring valuables to the hospital.  [facility] is not responsible                  for any belongings or valuables.               Contacts, dentures or bridgework may not be worn into surgery.  Leave suitcase in the car. After surgery it may be brought to your room.  For patients admitted to the hospital, discharge time is determined by your                treatment team.               Patients discharged the day of surgery will not be allowed to drive home.    Special Instructions: [place] - Preparing for Surgery  Before surgery, you can play an important role.  Because skin is not sterile, your skin needs to be as free of germs as possible.  You can reduce the number of germs on you skin by washing with CHG (chlorahexidine gluconate) soap before surgery.  CHG is an antiseptic cleaner which kills germs and bonds with the skin to continue killing germs even after washing.  Please DO NOT use if you have an allergy to CHG or antibacterial soaps.  If your skin becomes reddened/irritated stop using the CHG and inform your nurse when you arrive at Short Stay.  Do not shave (including legs and underarms) for at least 48 hours prior to the first CHG shower.  You may shave your face.  Please follow these  instructions carefully:   1.  Shower with CHG Soap the night before surgery and the                                morning of Surgery.  2.  If you choose to wash your hair, wash your hair first as usual with your       normal shampoo.  3.  After you shampoo, rinse your hair and body thoroughly to remove the                      Shampoo.  4.  Use CHG as you would any other liquid soap.  You can apply chg directly       to the skin and wash gently with scrungie or a clean washcloth.  5.  Apply the CHG Soap to your body ONLY FROM THE NECK DOWN.        Do not use on open wounds or open sores.  Avoid contact with your eyes, ears, mouth and genitals (private parts).  Wash genitals (private parts) with  your normal soap.  6.  Wash thoroughly, paying special attention to the area where your surgery        will be performed.  7.  Thoroughly rinse your body with warm water from the neck down.  8.  DO NOT shower/wash with your normal soap after using and rinsing off       the CHG Soap.  9.  Pat yourself dry with a clean towel.            10.  Wear clean pajamas.            11.  Place clean sheets on your bed the night of your first shower and do not        sleep with pets.  Day of Surgery  Do not apply any lotions the morning of surgery.  Please wear clean clothes to the hospital.     Please read over the following fact sheets that you were given: Pain Booklet, Coughing and Deep Breathing and Surgical Site Infection Prevention

## 2014-09-16 NOTE — Progress Notes (Signed)
Anesthesia Chart Review:  Patient is a 48 year old female scheduled for multiple teeth extractions with alveoloplasty, pre-prosthetic surgery, gross debridement of teeth.  She has SCC of the tongue base with metastasis to the left neck and underwent left tonsillectomy, left neck dissection, and awake tracheostomy (6 cuffed Shiley tracheotomy; discharged with 6 cuffless Shiley) on 08/20/14.  She needs chemoradiation and needs pre-dental treatments per protocol.  Gastrostomy tube was placed 08/24/14. Other history includes former smoker since 06/18/14, childhood murmur, fibromyalgia, COPD/chronic bronchitis, asthma, hypercholesterolemia, anemia, seizures, depression, anxiety with panic attacks, neuropathy (hands)  Labs from 09/14/14 noted.   I was not in my office at the time of her PAT visit, so PAT RN spoke with anesthesiologist Dr. Tobias Alexander this morning. He recommended contacting Dr. Simeon Craft to ensure he had no concerns/recommendations regarding securing her airway via her trach.  I have left a message with his MA Tracey at [phone].  Update: Linus Orn called back from Dr. Theressa Millard office.  He felt it was okay to use patient's tracheostomy for intraoperative airway management.  He said the cuffless trach could be exchanged if needed for a cuffed trach during surgery and cuffless trach replaced following the procedure.  George Hugh [facility] Short Stay Center/Anesthesiology Phone [phone] 09/16/2014 1:38 PM

## 2014-09-16 NOTE — Anesthesia Preprocedure Evaluation (Addendum)
Anesthesia Evaluation  Patient identified by MRN, date of birth, ID band Patient awake    Reviewed: Allergy & Precautions, Patient's Chart, lab work & pertinent test results  Airway Mallampati: Trach   Neck ROM: Full    Dental  (+) Poor Dentition   Pulmonary asthma , COPD COPD inhaler, former smoker,  TRACH         Cardiovascular negative cardio ROS  Rhythm:Regular     Neuro/Psych Anxiety Depression    GI/Hepatic negative GI ROS, Neg liver ROS,   Endo/Other  negative endocrine ROS  Renal/GU negative Renal ROS     Musculoskeletal   Abdominal (+)  Abdomen: soft.    Peds  Hematology 11/34   Anesthesia Other Findings Has TRACH, SP rad neck dissection for CA base of tongue.  Can use trach for surgical airway  Reproductive/Obstetrics                           Anesthesia Physical Anesthesia Plan  ASA: II  Anesthesia Plan: General   Post-op Pain Management:    Induction: Intravenous  Airway Management Planned: Tracheostomy  Additional Equipment:   Intra-op Plan:   Post-operative Plan:   Informed Consent: I have reviewed the patients History and Physical, chart, labs and discussed the procedure including the risks, benefits and alternatives for the proposed anesthesia with the patient or authorized representative who has indicated his/her understanding and acceptance.     Plan Discussed with:   Anesthesia Plan Comments: (Remove trach and place cuffed ET tube for surgery, multimodal pain RX)        Anesthesia Quick Evaluation

## 2014-09-16 NOTE — Progress Notes (Signed)
Spoke with Dr. Tobias Alexander prior to pt leaving PAT to see if an anesthesiologist needed to see pt. Dr. Enrique Sack had put in an order for anesthesiology consult due to the fact pt has a trach. Dr. Tobias Alexander stated that the anesthesiologist would see pt in AM and there shouldn't be any issues. He did suggest that I have [place], [place] check with Dr. Simeon Craft to make sure that he didn't have any concerns about pt being intubated thru trach. I will give chart to [facility].

## 2014-09-17 ENCOUNTER — Ambulatory Visit (HOSPITAL_COMMUNITY)
Admission: RE | Admit: 2014-09-17 | Discharge: 2014-09-17 | Disposition: A | Discharge status: Home / Self Care | Payer: 59 | Source: Ambulatory Visit | Attending: Dentistry | Admitting: Dentistry

## 2014-09-17 ENCOUNTER — Encounter (HOSPITAL_COMMUNITY)
Admission: RE | Disposition: A | Discharge status: Home / Self Care | Payer: Self-pay | Source: Ambulatory Visit | Attending: Dentistry

## 2014-09-17 ENCOUNTER — Ambulatory Visit (HOSPITAL_COMMUNITY): Payer: 59 | Admitting: Vascular Surgery

## 2014-09-17 ENCOUNTER — Other Ambulatory Visit: Payer: Self-pay | Admitting: Radiology

## 2014-09-17 ENCOUNTER — Encounter (HOSPITAL_COMMUNITY): Payer: Self-pay | Admitting: *Deleted

## 2014-09-17 ENCOUNTER — Ambulatory Visit (HOSPITAL_COMMUNITY): Payer: 59 | Admitting: Anesthesiology

## 2014-09-17 DIAGNOSIS — J449 Chronic obstructive pulmonary disease, unspecified: Secondary | ICD-10-CM | POA: Insufficient documentation

## 2014-09-17 DIAGNOSIS — Z792 Long term (current) use of antibiotics: Secondary | ICD-10-CM | POA: Insufficient documentation

## 2014-09-17 DIAGNOSIS — M797 Fibromyalgia: Secondary | ICD-10-CM | POA: Insufficient documentation

## 2014-09-17 DIAGNOSIS — M1990 Unspecified osteoarthritis, unspecified site: Secondary | ICD-10-CM | POA: Insufficient documentation

## 2014-09-17 DIAGNOSIS — M270 Developmental disorders of jaws: Secondary | ICD-10-CM | POA: Diagnosis present

## 2014-09-17 DIAGNOSIS — J029 Acute pharyngitis, unspecified: Secondary | ICD-10-CM | POA: Insufficient documentation

## 2014-09-17 DIAGNOSIS — Z7951 Long term (current) use of inhaled steroids: Secondary | ICD-10-CM | POA: Insufficient documentation

## 2014-09-17 DIAGNOSIS — K0530 Chronic periodontitis, unspecified: Secondary | ICD-10-CM | POA: Insufficient documentation

## 2014-09-17 DIAGNOSIS — F419 Anxiety disorder, unspecified: Secondary | ICD-10-CM | POA: Insufficient documentation

## 2014-09-17 DIAGNOSIS — K088 Other specified disorders of teeth and supporting structures: Secondary | ICD-10-CM | POA: Insufficient documentation

## 2014-09-17 DIAGNOSIS — Z923 Personal history of irradiation: Secondary | ICD-10-CM | POA: Insufficient documentation

## 2014-09-17 DIAGNOSIS — Z7952 Long term (current) use of systemic steroids: Secondary | ICD-10-CM | POA: Insufficient documentation

## 2014-09-17 DIAGNOSIS — K0889 Other specified disorders of teeth and supporting structures: Secondary | ICD-10-CM | POA: Diagnosis present

## 2014-09-17 DIAGNOSIS — Z79891 Long term (current) use of opiate analgesic: Secondary | ICD-10-CM | POA: Insufficient documentation

## 2014-09-17 DIAGNOSIS — Z87891 Personal history of nicotine dependence: Secondary | ICD-10-CM | POA: Insufficient documentation

## 2014-09-17 DIAGNOSIS — C01 Malignant neoplasm of base of tongue: Secondary | ICD-10-CM

## 2014-09-17 DIAGNOSIS — K036 Deposits [accretions] on teeth: Secondary | ICD-10-CM

## 2014-09-17 DIAGNOSIS — J45909 Unspecified asthma, uncomplicated: Secondary | ICD-10-CM | POA: Insufficient documentation

## 2014-09-17 DIAGNOSIS — G629 Polyneuropathy, unspecified: Secondary | ICD-10-CM | POA: Insufficient documentation

## 2014-09-17 HISTORY — PX: MULTIPLE EXTRACTIONS WITH ALVEOLOPLASTY: SHX5342

## 2014-09-17 SURGERY — MULTIPLE EXTRACTION WITH ALVEOLOPLASTY
Anesthesia: General | Site: Mouth

## 2014-09-17 MED ORDER — PHENYLEPHRINE HCL 10 MG/ML IJ SOLN
INTRAMUSCULAR | Status: DC | PRN
  Administered 2014-09-17: 120 ug via INTRAVENOUS
  Administered 2014-09-17: 80 ug via INTRAVENOUS

## 2014-09-17 MED ORDER — OXYCODONE HCL 5 MG/5ML PO SOLN
5.0000 mg | Freq: Once | ORAL | Status: AC
  Administered 2014-09-17: 5 mg

## 2014-09-17 MED ORDER — ACETAMINOPHEN 10 MG/ML IV SOLN
INTRAVENOUS | Status: DC | PRN
  Administered 2014-09-17: 1000 mg via INTRAVENOUS

## 2014-09-17 MED ORDER — ROCURONIUM BROMIDE 50 MG/5ML IV SOLN
INTRAVENOUS | Status: AC
  Filled 2014-09-17: qty 1

## 2014-09-17 MED ORDER — OXYCODONE HCL 5 MG/5ML PO SOLN
ORAL | Status: AC
  Administered 2014-09-17: 5 mg
  Filled 2014-09-17: qty 5

## 2014-09-17 MED ORDER — ISOPROPYL ALCOHOL 70 % SOLN
Status: DC | PRN
  Administered 2014-09-17: 1 via TOPICAL

## 2014-09-17 MED ORDER — FENTANYL CITRATE 0.05 MG/ML IJ SOLN
INTRAMUSCULAR | Status: DC | PRN
  Administered 2014-09-17: 50 ug via INTRAVENOUS
  Administered 2014-09-17: 25 ug via INTRAVENOUS

## 2014-09-17 MED ORDER — ONDANSETRON HCL 4 MG/2ML IJ SOLN
INTRAMUSCULAR | Status: AC
  Filled 2014-09-17: qty 2

## 2014-09-17 MED ORDER — NEOSTIGMINE METHYLSULFATE 10 MG/10ML IV SOLN
INTRAVENOUS | Status: AC
  Filled 2014-09-17: qty 1

## 2014-09-17 MED ORDER — MIDAZOLAM HCL 5 MG/5ML IJ SOLN
INTRAMUSCULAR | Status: DC | PRN
  Administered 2014-09-17: 2 mg via INTRAVENOUS

## 2014-09-17 MED ORDER — FENTANYL CITRATE 0.05 MG/ML IJ SOLN
INTRAMUSCULAR | Status: AC
  Filled 2014-09-17: qty 5

## 2014-09-17 MED ORDER — FENTANYL CITRATE 0.05 MG/ML IJ SOLN
25.0000 ug | INTRAMUSCULAR | Status: DC | PRN
  Administered 2014-09-17: 50 ug via INTRAVENOUS

## 2014-09-17 MED ORDER — LIDOCAINE HCL (CARDIAC) 20 MG/ML IV SOLN
INTRAVENOUS | Status: AC
  Filled 2014-09-17: qty 5

## 2014-09-17 MED ORDER — PROMETHAZINE HCL 25 MG/ML IJ SOLN
6.2500 mg | INTRAMUSCULAR | Status: DC | PRN
Start: 2014-09-17 — End: 2014-09-17

## 2014-09-17 MED ORDER — SUCCINYLCHOLINE CHLORIDE 20 MG/ML IJ SOLN
INTRAMUSCULAR | Status: AC
  Filled 2014-09-17: qty 1

## 2014-09-17 MED ORDER — BUPIVACAINE-EPINEPHRINE 0.5% -1:200000 IJ SOLN
INTRAMUSCULAR | Status: DC | PRN
  Administered 2014-09-17: 3.2 mL

## 2014-09-17 MED ORDER — LIDOCAINE HCL (CARDIAC) 20 MG/ML IV SOLN
INTRAVENOUS | Status: DC | PRN
  Administered 2014-09-17: 100 mg via INTRAVENOUS

## 2014-09-17 MED ORDER — PROPOFOL 10 MG/ML IV BOLUS
INTRAVENOUS | Status: DC | PRN
  Administered 2014-09-17: 100 mg via INTRAVENOUS

## 2014-09-17 MED ORDER — FENTANYL CITRATE 0.05 MG/ML IJ SOLN
INTRAMUSCULAR | Status: AC
  Filled 2014-09-17: qty 2

## 2014-09-17 MED ORDER — BUPIVACAINE-EPINEPHRINE (PF) 0.5% -1:200000 IJ SOLN
INTRAMUSCULAR | Status: AC
  Filled 2014-09-17: qty 5.4

## 2014-09-17 MED ORDER — ONDANSETRON HCL 4 MG/2ML IJ SOLN
INTRAMUSCULAR | Status: DC | PRN
  Administered 2014-09-17: 4 mg via INTRAVENOUS

## 2014-09-17 MED ORDER — PROPOFOL 10 MG/ML IV BOLUS
INTRAVENOUS | Status: AC
  Filled 2014-09-17: qty 20

## 2014-09-17 MED ORDER — LIDOCAINE-EPINEPHRINE 2 %-1:100000 IJ SOLN
INTRAMUSCULAR | Status: DC | PRN
Start: 2014-09-17 — End: 2014-09-17
  Administered 2014-09-17: 8.5 mL via INTRADERMAL

## 2014-09-17 MED ORDER — ACETAMINOPHEN 10 MG/ML IV SOLN
INTRAVENOUS | Status: AC
  Filled 2014-09-17: qty 100

## 2014-09-17 MED ORDER — HEMOSTATIC AGENTS (NO CHARGE) OPTIME
TOPICAL | Status: DC | PRN
  Administered 2014-09-17: 1 via TOPICAL

## 2014-09-17 MED ORDER — OXYCODONE-ACETAMINOPHEN 5-325 MG PO TABS: ORAL_TABLET | ORAL | Status: DC

## 2014-09-17 MED ORDER — ARTIFICIAL TEARS OP OINT
TOPICAL_OINTMENT | OPHTHALMIC | Status: DC | PRN
  Administered 2014-09-17: 1 via OPHTHALMIC

## 2014-09-17 MED ORDER — LIDOCAINE-EPINEPHRINE 2 %-1:100000 IJ SOLN
INTRAMUSCULAR | Status: AC
  Filled 2014-09-17: qty 10.2

## 2014-09-17 MED ORDER — LACTATED RINGERS IV SOLN
INTRAVENOUS | Status: DC | PRN
  Administered 2014-09-17 (×2): via INTRAVENOUS

## 2014-09-17 MED ORDER — ARTIFICIAL TEARS OP OINT
TOPICAL_OINTMENT | OPHTHALMIC | Status: AC
  Filled 2014-09-17: qty 7

## 2014-09-17 MED ORDER — MEPERIDINE HCL 25 MG/ML IJ SOLN: 6.2500 mg | INTRAMUSCULAR | Status: DC | PRN

## 2014-09-17 MED ORDER — OXYMETAZOLINE HCL 0.05 % NA SOLN
NASAL | Status: AC
  Filled 2014-09-17: qty 15

## 2014-09-17 MED ORDER — 0.9 % SODIUM CHLORIDE (POUR BTL) OPTIME
TOPICAL | Status: DC | PRN
  Administered 2014-09-17: 1000 mL

## 2014-09-17 MED ORDER — GLYCOPYRROLATE 0.2 MG/ML IJ SOLN
INTRAMUSCULAR | Status: AC
  Filled 2014-09-17: qty 3

## 2014-09-17 MED ORDER — MIDAZOLAM HCL 2 MG/2ML IJ SOLN
INTRAMUSCULAR | Status: AC
  Filled 2014-09-17: qty 2

## 2014-09-17 MED ORDER — SODIUM CHLORIDE 0.9 % IV SOLN
10.0000 mg | INTRAVENOUS | Status: DC | PRN
  Administered 2014-09-17: 20 ug/min via INTRAVENOUS

## 2014-09-17 MED ORDER — PHENYLEPHRINE 40 MCG/ML (10ML) SYRINGE FOR IV PUSH (FOR BLOOD PRESSURE SUPPORT)
PREFILLED_SYRINGE | INTRAVENOUS | Status: AC
  Filled 2014-09-17: qty 10

## 2014-09-17 SURGICAL SUPPLY — 33 items
ALCOHOL 70% 16 OZ (MISCELLANEOUS) ×3 IMPLANT
ATTRACTOMAT 16X20 MAGNETIC DRP (DRAPES) ×3 IMPLANT
BLADE SURG 15 STRL LF DISP TIS (BLADE) ×2 IMPLANT
BLADE SURG 15 STRL SS (BLADE) ×4
COVER SURGICAL LIGHT HANDLE (MISCELLANEOUS) ×3 IMPLANT
GAUZE PACKING FOLDED 2  STR (GAUZE/BANDAGES/DRESSINGS) ×2
GAUZE PACKING FOLDED 2 STR (GAUZE/BANDAGES/DRESSINGS) ×1 IMPLANT
GAUZE SPONGE 4X4 16PLY XRAY LF (GAUZE/BANDAGES/DRESSINGS) ×3 IMPLANT
GLOVE BIOGEL PI IND STRL 6 (GLOVE) ×1 IMPLANT
GLOVE BIOGEL PI INDICATOR 6 (GLOVE) ×2
GLOVE SURG SS PI 6.0 STRL IVOR (GLOVE) ×3 IMPLANT
GLOVE SURG SS PI 8.0 STRL IVOR (GLOVE) ×3 IMPLANT
GOWN STRL REUS W/ TWL LRG LVL3 (GOWN DISPOSABLE) ×1 IMPLANT
GOWN STRL REUS W/TWL 2XL LVL3 (GOWN DISPOSABLE) ×3 IMPLANT
GOWN STRL REUS W/TWL LRG LVL3 (GOWN DISPOSABLE) ×2
HEMOSTAT SURGICEL 2X14 (HEMOSTASIS) IMPLANT
KIT BASIN OR (CUSTOM PROCEDURE TRAY) ×3 IMPLANT
KIT ROOM TURNOVER OR (KITS) ×3 IMPLANT
MANIFOLD NEPTUNE WASTE (CANNULA) ×3 IMPLANT
NEEDLE BLUNT 16X1.5 OR ONLY (NEEDLE) ×3 IMPLANT
NS IRRIG 1000ML POUR BTL (IV SOLUTION) ×3 IMPLANT
PACK EENT II TURBAN DRAPE (CUSTOM PROCEDURE TRAY) ×3 IMPLANT
PAD ARMBOARD 7.5X6 YLW CONV (MISCELLANEOUS) ×3 IMPLANT
SPONGE SURGIFOAM ABS GEL 100 (HEMOSTASIS) ×3 IMPLANT
SPONGE SURGIFOAM ABS GEL 12-7 (HEMOSTASIS) IMPLANT
SPONGE SURGIFOAM ABS GEL SZ50 (HEMOSTASIS) IMPLANT
SUCTION FRAZIER TIP 10 FR DISP (SUCTIONS) ×3 IMPLANT
SUT CHROMIC 3 0 PS 2 (SUTURE) ×12 IMPLANT
SYR 50ML SLIP (SYRINGE) ×3 IMPLANT
TOWEL OR 17X26 10 PK STRL BLUE (TOWEL DISPOSABLE) ×3 IMPLANT
TUBE CONNECTING 12'X1/4 (SUCTIONS) ×1
TUBE CONNECTING 12X1/4 (SUCTIONS) ×2 IMPLANT
YANKAUER SUCT BULB TIP NO VENT (SUCTIONS) ×3 IMPLANT

## 2014-09-17 NOTE — Transfer of Care (Signed)
Immediate Anesthesia Transfer of Care Note  Patient: Shelby Larson  Procedure(s) Performed: Procedure(s): Extraction of tooth #'s [phone] with alveoloplasty, mandibular left lingual torus reduction, and gross debridement of remaining teeth. (N/A)  Patient Location: PACU  Anesthesia Type:General  Level of Consciousness: awake, alert , oriented and patient cooperative  Airway & Oxygen Therapy: Patient Spontanous Breathing and Patient connected to tracheostomy mask oxygen  Post-op Assessment: Report given to RN and Post -op Vital signs reviewed and stable  Post vital signs: Reviewed and stable  Last Vitals:  Filed Vitals:   09/17/14 0603  BP: 97/66  Pulse: 102  Temp: 37 C  Resp: 18    Complications: No apparent anesthesia complications

## 2014-09-17 NOTE — Anesthesia Postprocedure Evaluation (Signed)
  Anesthesia Post-op Note  Patient: Shelby Larson  Procedure(s) Performed: Procedure(s): Extraction of tooth #'s [phone] with alveoloplasty, mandibular left lingual torus reduction, and gross debridement of remaining teeth. (N/A)  Patient Location: PACU  Anesthesia Type:General  Level of Consciousness: awake and alert   Airway and Oxygen Therapy: Patient Spontanous Breathing and Patient connected to nasal cannula oxygen  Post-op Pain: mild  Post-op Assessment: Post-op Vital signs reviewed and Patient's Cardiovascular Status Stable  Post-op Vital Signs: Reviewed and stable  Last Vitals:  Filed Vitals:   09/17/14 0603  BP: 97/66  Pulse: 102  Temp: 37 C  Resp: 18    Complications: No apparent anesthesia complications

## 2014-09-17 NOTE — Op Note (Signed)
OPERATIVE REPORT  Patient:            Shelby Larson Date of Birth:  1965-08-20 MRN:                [id number]   DATE OF PROCEDURE:  09/17/2014  PREOPERATIVE DIAGNOSES: 1.  Squamous cell carcinoma of the base of tongue 2.  Pre-radiation therapy dental protocol 3.  Chronic periodontitis 4.  Tooth mobility 5.  Accretions 6.  Mandibular left lingual torus  POSTOPERATIVE DIAGNOSES: 1.  Squamous cell carcinoma of the base of tongue 2.  Pre-radiation therapy dental protocol 3.  Chronic periodontitis 4.  Tooth mobility 5.  Accretions 6. Mandibular left lingual torus  OPERATIONS: 1. Multiple extraction of tooth numbers 2, 14, 15, 18, and 31 2. 4 Quadrants of alveoloplasty 3. Gross debridement of remaining dentition 4. Mandibular left lingual torus reduction   SURGEON: Lenn Cal, DDS  ASSISTANT: Camie Patience, (dental assistant)  ANESTHESIA: General anesthesia via tracheostomy site   MEDICATIONS: 1. Clindamycin 600 mg IV prior to invasive dental procedures. 2. Local anesthesia with a total utilization of 5 carpules each containing 34 mg of lidocaine with 0.017 mg of epinephrine as well as 2 carpules each containing 9 mg of bupivacaine with 0.009 mg of epinephrine.  SPECIMENS: There are 5 teeth that were discarded.  DRAINS: None  CULTURES: None  COMPLICATIONS: None   ESTIMATED BLOOD LOSS: 100 mLs.  INTRAVENOUS FLUIDS: 1000 mLs of Lactated ringers solution.  INDICATIONS: The patient was recently diagnosed with squamous cell carcinoma of the base of tongue. Patient with anticipated radiation therapy.  A dental consultation was then requested as part of a medically necessary preradiation therapy dental protocol.  The patient was examined and treatment planned for multiple extractions with alveoloplasty, pre-prosthetic surgery as indicated, and gross debridement of remaining dentition.  This treatment plan was formulated to decrease the risks and complications  associated with dental infection from affecting the patient's systemic health and to prevent future complications such as osteoradionecrosis.  OPERATIVE FINDINGS: Patient was examined in operating room number 4.  The teeth were identified for extraction. The patient was noted be affected by chronic periodontitis, accretions, tooth mobility, and mandibular left lingual torus.   DESCRIPTION OF PROCEDURE: Patient was brought to the main operating room number 4. Patient was then placed in the supine position on the operating table. General anesthesia was then induced per the anesthesia team. The patient was then prepped and draped in the usual manner for dental medicine procedure. A timeout was performed. The patient was identified and procedures were verified. A throat pack was placed at this time. The oral cavity was then thoroughly examined with the findings noted above. The patient was then ready for dental medicine procedure as follows:  Local anesthesia was then administered sequentially with a total utilization of 5 carpules each containing 34 mg of lidocaine with 0.017 mg of epinephrine as well as 2 carpules  each containing 9 mg bupivacaine with 0.009 mg of epinephrine.  The Maxillary left and right quadrants first approached. Anesthesia was then delivered utilizing infiltration with lidocaine with epinephrine. A #15 blade incision was then made from the maxillary right tuberosity and extended to the mesial of #3.  A  surgical flap was then carefully reflected. Tooth #2 was then subluxated with a series of straight elevators. The coronal aspect of tooth #2 was then removed leaving the roots remaining. Further bone was then removed around retained roots. The roots were then elevated out  with a series of cryers elevators without complication. Alveoloplasty was then performed utilizing a ronguers and bone file. The surgical site was then irrigated with copious amounts of sterile saline. A piece of  Surgifoam was placed in each extraction socket appropriately. The maxillary right surgical site was then closed from the maxillary right tuberosity and extended to the distal of #3 utilizing 3-0 chromic gut suture in a continuous interrupted suture technique 1. An interproximal suture was then placed between tooth numbers 3 and 4 to further close the surgical site.   At this point time the maxillary left surgical site was approached. 15 blade incision was then made from the maxillary left tuberosity and extended to the mesial of #14. Appropriate amounts of buccal and interseptal bone was then removed around tooth numbers 14 and 15 with a surgical handpiece and bur and copious amounts sterile water. The teeth were then subluxated with a series of straight elevators. Tooth numbers 14 and 15 were then removed utilizing a 53L forceps. Retained root tip in the area of #15 was then removed with a root tip pick without further complication. Alveoloplasty was then performed utilizing a rongeur and bone file. The tissues were approximated and trimmed appropriately. The surgical site was irrigated with copious amounts of sterile saline. A piece of Surgifoam was then placed in each extraction socket appropriately. The maxillary left surgical site was then closed from the maxillary left tuberosity and extended to the mesial of #14 utilizing 3-0 chromic gut suture in a continuous interrupted suture technique 1.    At this point time, the mandibular quadrants were approached. The patient was given bilateral inferior alveolar nerve blocks and long buccal nerve blocks utilizing the bupivacaine with epinephrine. Further infiltration was then achieved utilizing the lidocaine with epinephrine. A 15 blade incision was then made from the distal of number 17 and extended to the mesial of #19.  A surgical flap was then carefully reflected. Appropriate amounts of buccal and interseptal bone were then removed utilizing a surgical  handpiece and copious amount of sterile water around tooth #18. Tooth number 18 then had the coronal aspect removed with a 23 forceps leaving mesial and distal roots remaining. Further bone was then removed with a surgical handpiece and bur and copious amounts of sterile water around the remaining root tips. The roots were then elevated out with a series of cryers elevators. Alveoloplasty was then performed utilizing a rongeur and bone file. The surgical site was irrigated with copious amounts of sterile saline. The tissues were approximated and trimmed appropriately. At this point time, a 15 blade incision was made from the distal of #19 and extended to the distal of #22 on the lingual aspect. A surgical flap was reflected on the lingual aspect to expose the mandibular left torus. The torus was then reduced utilizing a surgical handpiece and bur and copious amounts sterile water. Alveoloplasty was then performed utilizing a rongeur and bone file. The surgical site was then irrigated with copious amounts sterile saline. A piece of Surgifoam was then placed in the extraction socket #18. The Surgical site was then closed from the distal of #17 and extended to the distal of #19 utilizing 3-0 chromic gut suture in a continuous interrupted suture technique 1. Interrupted sutures were then placed at approximately between tooth numbers 19 /20, 20 /21, and 21 /22 .    This point time the mandibular right quadrant was approached. A 15 blade incision was made from the distal of #32 and  extended to the mesial #30. A surgical flap was then carefully reflected. Appropriate amounts of buccal and interseptal bone was then removed around tooth #31 with a surgical handpiece and bur and copious amounts sterile water. Tooth #31 was then subluxated with a series straight elevators. Tooth #31 then had the coronal aspect removed with a 23 forceps leaving roots remaining. Further bone was then removed around the roots with a surgical  handpiece and bur and copious amounts sterile water. The roots were then elevated out with a series of cryers elevators. A small (less than 0.5 mm retained root) may be present in the area of #31 on the mesial. This will be evaluated radiographically at the next outpatient visit. Patient was informed of this finding. Alveoloplasty was then performed utilizing a rongeurs and bone file. The tissues were approximated and trimmed appropriately. The surgical sites were then irrigated with copious amounts of sterile saline. A piece of Surgifoam was placed extraction sockets appropriately. The mandibular right surgical site was then closed from the distal of  #32 and extended to the mesial of #31 utilizing 3-0 chromic gut suture in a continuous surface suture technique 1.    At this point time the remaining dentition was approached. A sonic scaler was used to remove significant accretions. A series of hand curettes were utilized refine removal of accretions. A sonic scaler was then again used to further refine removal of accretions. At this point time the entire mouth was irrigated with copious amounts sterile saline. The patient was examined for complications, seeing none, the dental medicine procedure was deemed to be complete. The throat pack was removed at this time. A series of 4 x 4 gauze were placed in the mouth to aid hemostasis. The patient was then handed over to the anesthesia team for final disposition. After an appropriate amount of time, the patient was extubated and taken to the postanesthsia care unit in good condition. All counts were correct for the dental medicine procedure.   Lenn Cal, DDS.

## 2014-09-17 NOTE — Discharge Instructions (Signed)

## 2014-09-17 NOTE — Progress Notes (Signed)
PRE-OPERATIVE NOTE:  09/17/2014   Shelby Larson [id number]  VITALS: BP 97/66 mmHg  Pulse 102  Temp(Src) 98.6 F (37 C) (Oral)  Resp 18  Ht 5\' 6"  (1.676 m)  Wt 121 lb (54.885 kg)  BMI 19.54 kg/m2  SpO2 100%  Lab Results  Component Value Date   WBC 11.2* 09/14/2014   HGB 11.0* 09/14/2014   HCT 33.9* 09/14/2014   MCV 90.2 09/14/2014   PLT 487* 09/14/2014   BMET    Component Value Date/Time   NA 134* 09/14/2014 1600   NA 137 09/02/2014 1007   K 4.2 09/14/2014 1600   K 4.5 09/02/2014 1007   CL 99 09/14/2014 1600   CO2 25 09/14/2014 1600   CO2 27 09/02/2014 1007   GLUCOSE 76 09/14/2014 1600   GLUCOSE 87 09/02/2014 1007   BUN 17 09/14/2014 1600   BUN 10.7 09/02/2014 1007   CREATININE 0.74 09/14/2014 1600   CREATININE 0.8 09/02/2014 1007   CALCIUM 9.9 09/14/2014 1600   CALCIUM 10.1 09/02/2014 1007   GFRNONAA >90 09/14/2014 1600   GFRAA >90 09/14/2014 1600    Lab Results  Component Value Date   INR 1.11 08/20/2014   No results found for: PTT   Shelby Larson presents for multiple dental extractions with alveoloplasty, preprosthetic surgery as needed, and gross debridement of remaining the in the operating room with general anesthesia.  SUBJECTIVE: The patient denies any acute medical or dental changes and agrees to proceed with treatment as planned.  EXAM: No sign of acute dental changes.  ASSESSMENT: Patient is affected by Cancer of the base of tongue, chronic periodontitis, tooth mobility, and accretions and  Mandibular torus.  PLAN: Patient agrees to proceed with treatment as planned in the operating room as previously discussed and accepts the risks, benefits, and complications of the proposed treatment. Patient is aware of the risk for bleeding, bruising, swelling, infection, pain, nerve damage, soft tissue damage, sinus involvement, root tip fracture, mandible fracture, and the risks of complications associated with the anesthesia. Patient also  is aware of the potential for other complications not mentioned above.   Lenn Cal, DDS

## 2014-09-17 NOTE — Progress Notes (Signed)
Report to David RN

## 2014-09-17 NOTE — H&P (Signed)
09/17/2014  Patient:            Shelby Larson Date of Birth:  10-17-1965 MRN:                [id number]   BP 97/66 mmHg  Pulse 102  Temp(Src) 98.6 F (37 C) (Oral)  Resp 18  Ht 5\' 6"  (1.676 m)  Wt 121 lb (54.885 kg)  BMI 19.54 kg/m2  SpO2 100%  Shelby Larson is a 49 year old female that presents for multiple extractions with alveoloplasty and gross debridement of remaining teeth. Patient denies acute medical and dental changes. Please use note from [name] on 09/02/14 as H and P for dental OR procedures.  Dr. 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 female from [place], [place] with h/o tobacco and ETOH abuse (she quit both in the past few weeks) who presented with a growing left neck mass and hemoptysis.  CT Scan of neck and chest on 08-08-14 revealed 1. 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 The mass causes moderate to advanced airway narrowing. For staging, the tumor involves the tongue base, left glossotonsillar sulcus, lingual epiglottis and likely preepiglottic fat. 2. Bilateral cervical adenopathy, levels noted above. A lateral left retropharyngeal lymph node is also suspicious due to asymmetry. 3. No evidence of thoracic metastatic disease.  On 08-20-14 she underwent biopsy of tongue base and left neck dissection. Biopsy of tumor was + for mod differentiated squamous cell carcinoma, p16 negative. 3/16 nodes were positive, and ECE was present. Largest node 3.4 cm  CT of Abdomen without cont on 08-21-14 revealed no obvious metastatic disease.  PET has not  been done.   Tracheostomy and PEG tube have been placed, and she is recovering from the inpatient stay in [facility] care. She has become adept at [facility] and trach care but would appreciate home health support, eventually, after discharge.      Nutrition Status Yes No Comments  Weight changes? [X]  [ ]  6 lb wgt gain this month  Swallowing concerns? [X]  [ ]  Post op  PEG? [X]  [ ]  Already placed, pureed/soft foods as tol, thin liquids   Referrals Yes No Comments  Social Work? [X]  [ ]  [employer]   Dentistry? [ ]  [X]    Swallowing therapy? [X]  [ ]  08/27/14  Nutrition? [ ]  [X]    Med/Onc? [X]  [ ]  [facility] appt on 09/14/14   Safety Issues Yes No Comments  Prior radiation? [ ]  [X]    Pacemaker/ICD? [ ]  [X]    Possible current pregnancy? [ ]  [X]    Is the patient on methotrexate? [ ]  [X]     Tobacco/Marijuana/Snuff/ETOH use: cigarette smoker 1/2 PPD x 20 years, quit 05/2014, no drug use, alcohol use occasional, but no longer using 09/02/14  Past/Anticipated interventions by otolaryngology, if any: Dr Simeon Craft 08/20/14 Left selective neck dissection with sparing of 11th cranial nerve, sternocleidomastoid muscle, internal jugular vein, biopsy , tracheotomy  Past/Anticipated interventions by medical oncology, if any: new consult with Dr Dellie Catholic on 09/14/14  Current Complaints / other details: currently at [facility], lives with mother otherwise She is taking in [employer] 2 cans daily, eating  regular diet without difficulty of eating, chewing, swallowing. She states her appetite fluctuates but this is her norm. Pain of right side of face and neck, incisional area, also c/o pain at her peg tube site. Takes Hydrocodone prn for pain. Sore throat.   PATHOLOGY: REPORT OF SURGICAL PATHOLOGY ADDITIONAL INFORMATION: 1. There is absence of p16 immunostain expression within the invasive tumor. (CR:kh 08/24/14) Shelby RUND  DO Pathologist, Electronic Signature ( Signed 08/24/2014) FINAL DIAGNOSIS Diagnosis 1. Tongue, biopsy, left base/ pharynx mass - INVASIVE MODERATELY DIFFERENTIATED SQUAMOUS CELL CARCINOMA. - SEE COMMENT. 2. Lymph nodes, radical neck dissection, Left - THREE OF SIXTEEN LYMPH NODES POSITIVE FOR METASTATIC SQUAMOUS CELL CARCINOMA (3/16). - LARGEST LYMPH NODE IS LARGELY REPLACED BY TUMOR, MEASURES 3.4 CM IN GREATEST DIMENSION AND SHOWS EVIDENCE OF EXTRACAPSULAR EXTENSION. Microscopic Comment 1. The tissue is received fragmented and consists of fragments of invasive squamous cell carcinoma. The tumor appears moderately differentiated and arises from the superficial benign squamous mucosal surface.   PREVIOUS RADIATION THERAPY: No  PAST MEDICAL HISTORY:  has a past medical history of Seizures; Heart murmur; Chronic bronchitis; Anxiety; Depression; Headache; Neuropathy; Arthritis; Fibromyalgia; Cancer; Anemia; Rosacea; and Hypercholesterolemia.   PAST SURGICAL HISTORY: Past Surgical History  Procedure Laterality Date  . Wisdom tooth extraction    . Tonsillectomy Left 08/20/2014    Procedure: TONSILLECTOMY; Surgeon: Ruby Cola, MD; Location: [facility] OR; Service: ENT; Laterality: Left;  . Radical neck dissection Left 08/20/2014    Procedure: RADICAL LEFT NECK DISSECTION ; Surgeon: Ruby Cola, MD; Location: [place]; Service: ENT; Laterality: Left;  . Tracheostomy tube placement N/A 08/20/2014    Procedure: TRACHEOSTOMY - AWAKE; Surgeon: Ruby Cola, MD; Location: [place]; Service: ENT; Laterality: N/A;    FAMILY HISTORY: family history includes Alcoholism in her father and sister; Hypertension in her mother; Neuropathy in her mother.  SOCIAL HISTORY:  reports that she quit smoking about 2 months ago. Her smoking use included Cigarettes. She has a 10 pack-year smoking history. She has never used smokeless tobacco. She reports that she drinks alcohol. She  reports that she does not use illicit drugs.  ALLERGIES: Bee venom; Penicillins; and Latex  MEDICATIONS:  Current Outpatient Prescriptions  Medication Sig Dispense Refill  . Acetaminophen (CHLORASEPTIC SORE THROAT PO) Take 2 sprays by mouth daily as needed (sore throat).    Marland Kitchen acetaminophen (TYLENOL) 160 MG/5ML solution Take 10.2 mLs (325 mg total) by mouth every 4 (four) hours as needed for mild pain, moderate pain or headache. 473 mL 0  . acetaminophen-codeine 120-12 MG/5ML solution Take 10 mLs by mouth every 4 (four) hours as needed for moderate pain or severe pain (PO or per tube; take the tylenol with codeine OR the hydrocodone, not both). 500 mL 0  . Amino Acids-Protein Hydrolys (FEEDING SUPPLEMENT, PRO-STAT SUGAR FREE 64,) LIQD Place 30 mLs into feeding tube daily. 900 mL 0  . bacitracin ointment Apply topically 3 (three) times daily. Apply to left neck incision TID x 3 weeks 120 g 2  . cyclobenzaprine (FLEXERIL) 10 MG tablet Take 10 mg by mouth 3 (three) times daily.    . fluticasone (FLONASE) 50 MCG/ACT nasal spray Place 2 sprays into both nostrils daily.    Marland Kitchen gabapentin (NEURONTIN) 600 MG tablet Take 600 mg by mouth 3 (three) times daily.    Marland Kitchen HYDROcodone-acetaminophen (NORCO/VICODIN) 5-325 MG per tablet Take 1 tablet by mouth every 6 (six) hours as needed for severe pain. 15 tablet 0  . lidocaine (XYLOCAINE) 2 % solution Use  as directed 20 mLs in the mouth or throat as needed for mouth pain. 100 mL 0  . MAGNESIUM PO Take 2 tablets by mouth daily.    . Multiple Vitamin (MULTIVITAMIN WITH MINERALS) TABS tablet Take 1 tablet by mouth daily.    . Nutritional Supplements (FEEDING SUPPLEMENT, JEVITY 1.2 CAL,) LIQD Place 480 mLs into feeding tube every 8 (eight) hours. 10080 mL PRN  . sertraline (ZOLOFT) 100 MG tablet Take 100 mg by mouth daily.    Marland Kitchen tetrahydrozoline-zinc (VISINE-AC) 0.05-0.25 % ophthalmic solution  Place 2 drops into both eyes daily as needed (dry eyes).    . LORazepam (ATIVAN) 0.5 MG tablet Take 1-2 tablets 20 minutes prior to wearing radiation mask, PRN anxiety 40 tablet 0  . methocarbamol (ROBAXIN) 500 MG tablet Take 500 mg by mouth every 8 (eight) hours as needed for muscle spasms.    . naproxen sodium (ANAPROX) 220 MG tablet Take 440 mg by mouth daily as needed (pain).    . QUEtiapine (SEROQUEL) 50 MG tablet Take 50 mg by mouth daily as needed (sleep).    . sucralfate (CARAFATE) 1 G tablet Dissolve 1 tablet in 10 mL H20 and swallow 30 min prior to meals and bedtime. 60 tablet 5   No current facility-administered medications for this encounter.    REVIEW OF SYSTEMS: Notable for that above.  PHYSICAL EXAM:  height is 5\' 6"  (1.676 m) and weight is 122 lb 8 oz (55.566 kg). Her oral temperature is 99.2 F (37.3 C). Her blood pressure is 90/62 and her pulse is 89. Her respiration is 20 and oxygen saturation is 99%.   General: Alert and oriented, in no acute distress HEENT: Head is normocephalic. Extraocular movements are intact. Oropharynx is proximally clear. Neck: postoperative sutures along left neck, no palpable cervical or supraclavicular lymphadenopathy. +Tracheostomy, collar Heart: Regular in rate and rhythm with no murmurs, rubs, or gallops. Chest: faint b/l wheezes. Abdomen: PEG site unremarkable Extremities: No cyanosis or edema.  Lymphatics: see Neck Exam Skin: No concerning lesions. Musculoskeletal: symmetric strength and muscle tone throughout. Neurologic: Cranial nerves II through XII are grossly intact. No obvious focalities. Speech is fluent. Coordination is intact. Psychiatric: Judgment and insight are intact. Affect is appropriate.  ECOG = 1  0 - Asymptomatic (Fully active, able to carry on all predisease activities without restriction)  1 - Symptomatic but completely ambulatory (Restricted in physically strenuous activity but  ambulatory and able to carry out work of a light or sedentary nature. For example, light housework, office work)  2 - Symptomatic, <50% in bed during the day (Ambulatory and capable of all self care but unable to carry out any work activities. Up and about more than 50% of waking hours)  3 - Symptomatic, >50% in bed, but not bedbound (Capable of only limited self-care, confined to bed or chair 50% or more of waking hours)  4 - Bedbound (Completely disabled. Cannot carry on any self-care. Totally confined to bed or chair)  5 - Death  Eustace Pen MM, Creech RH, Tormey DC, et al. [phone]). "Toxicity and response criteria of the [facility]". [place] Oncol. 5 (6): 649-55   LABORATORY DATA:   Recent Labs    Lab Results  Component Value Date   WBC 11.3* 09/02/2014   HGB 11.1* 09/02/2014   HCT 34.8 09/02/2014   MCV 92.6 09/02/2014   PLT 396 09/02/2014     CMP  Labs (Brief)       Component  Value Date/Time   NA 137 09/02/2014 1007   NA 135 08/21/2014 0242   K 4.5 09/02/2014 1007   K 4.0 08/21/2014 0242   CL 96 08/21/2014 0242   CO2 27 09/02/2014 1007   CO2 26 08/21/2014 0242   GLUCOSE 87 09/02/2014 1007   GLUCOSE 108* 08/21/2014 0242   BUN 10.7 09/02/2014 1007   BUN 5* 08/21/2014 0242   CREATININE 0.8 09/02/2014 1007   CREATININE 0.84 08/21/2014 0242   CALCIUM 10.1 09/02/2014 1007   CALCIUM 7.9* 08/21/2014 0242   PROT 6.4 08/21/2014 0242   ALBUMIN 3.3* 08/21/2014 0242   AST 27 08/21/2014 0242   ALT 12 08/21/2014 0242   ALKPHOS 76 08/21/2014 0242   BILITOT 1.1 08/21/2014 0242   GFRNONAA 81* 08/21/2014 0242   GFRAA >90 08/21/2014 0242        Recent Labs    Lab Results  Component Value Date   TSH 4.239* 09/02/2014        Imaging Results    RADIOGRAPHY: Ct Abdomen Wo Contrast  08/21/2014 CLINICAL DATA: Preop  gastrostomy tube planning. EXAM: CT ABDOMEN WITHOUT CONTRAST TECHNIQUE: Multidetector CT imaging of the abdomen was performed following the standard protocol without IV contrast. COMPARISON: Abdomen 08/20/2014 FINDINGS: Consolidation or atelectasis in both lung bases. No pleural effusions. Enteric tube tip terminates in the distal esophagus. The unenhanced appearance of the liver, spleen, gallbladder, pancreas, adrenal glands, abdominal aorta, inferior vena cava, and retroperitoneal lymph nodes is unremarkable. Small accessory spleen. 2 mm stone in the midportion right kidney. No hydronephrosis in either kidney. Stomach is gas-filled without abnormal distention or wall thickening. No evidence of colonic interposition anterior to the stomach. Small bowel are decompressed. Diffusely stool-filled colon. No free air or free fluid in the abdomen. Visualized abdominal wall musculature appears intact. Degenerative disc disease at L5-S1. No destructive bone lesions. IMPRESSION: Consolidation in both lung bases. Enteric tube tip terminates in the distal esophagus. Nonobstructing stone in the right kidney. No evidence of bowel obstruction or dilatation. Electronically Signed By: Lucienne Capers M.D. On: 08/21/2014 18:51   Ct Soft Tissue Neck W Contrast  08/08/2014 CLINICAL DATA: Hemoptysis. EXAM: CT NECK WITH CONTRAST CHEST CT WITH CONTRAST TECHNIQUE: Multidetector CT imaging of the neck and chest was performed using the standard protocol following the bolus administration of intravenous contrast. CONTRAST: 25mL OMNIPAQUE IOHEXOL 300 MG/ML SOLN COMPARISON: None. FINDINGS: Pharynx and larynx: 3.2 cm mass in the left oropharynx, centered to the left with broad contact with the left posterior tongue and glossotonsillar sulcus. Probable early extension into the pre epiglottic fat. The margin with the lingual epiglottis is indistinct, but this could be displacement rather than invasion. No glottis or  infraglottic tumor. No evidence of cord paralysis. Adenopathy in the left cervical chain, levels IIA/B, III, and IV. The largest left node is in the jugulodigastric measuring 23 mm in maximal diameter. The right jugulodigastric node is mildly enlarged but appears heterogeneous in enhancement. There is mild but asymmetric enlargement of the lateral left retropharyngeal lymph node, without necrosis. Salivary glands: Negative Thyroid: Negative Vascular: Cervical carotid atherosclerosis without hemodynamically significant stenosis. Limited intracranial: Negative Mastoids and visualized paranasal sinuses: Negative Skeleton: No evidence of metastasis THORACIC INLET/BODY WALL: No acute abnormality. MEDIASTINUM: Normal heart size. No pericardial effusion. No acute vascular abnormality. No adenopathy. LUNG WINDOWS: Suspect mild emphysema. No consolidation. No effusion. No suspicious pulmonary nodule. UPPER ABDOMEN: No evidence of metastatic disease OSSEOUS: No acute fracture. No suspicious lytic or blastic lesions. IMPRESSION: 1. 3  cm oropharynx tumor compatible with squamous cell carcinoma. The mass causes moderate to advanced airway narrowing. For staging, the tumor involves the tongue base, left glossotonsillar sulcus, lingual epiglottis and likely preepiglottic fat. 2. Bilateral cervical adenopathy, levels noted above. A lateral left retropharyngeal lymph node is also suspicious due to asymmetry. 3. No evidence of thoracic metastatic disease. Electronically Signed By: Monte Fantasia M.D. On: 08/08/2014 12:42   Ct Chest W Contrast  08/08/2014 CLINICAL DATA: Hemoptysis. EXAM: CT NECK WITH CONTRAST CHEST CT WITH CONTRAST TECHNIQUE: Multidetector CT imaging of the neck and chest was performed using the standard protocol following the bolus administration of intravenous contrast. CONTRAST: 65mL OMNIPAQUE IOHEXOL 300 MG/ML SOLN COMPARISON: None. FINDINGS: Pharynx and larynx: 3.2 cm mass  in the left oropharynx, centered to the left with broad contact with the left posterior tongue and glossotonsillar sulcus. Probable early extension into the pre epiglottic fat. The margin with the lingual epiglottis is indistinct, but this could be displacement rather than invasion. No glottis or infraglottic tumor. No evidence of cord paralysis. Adenopathy in the left cervical chain, levels IIA/B, III, and IV. The largest left node is in the jugulodigastric measuring 23 mm in maximal diameter. The right jugulodigastric node is mildly enlarged but appears heterogeneous in enhancement. There is mild but asymmetric enlargement of the lateral left retropharyngeal lymph node, without necrosis. Salivary glands: Negative Thyroid: Negative Vascular: Cervical carotid atherosclerosis without hemodynamically significant stenosis. Limited intracranial: Negative Mastoids and visualized paranasal sinuses: Negative Skeleton: No evidence of metastasis THORACIC INLET/BODY WALL: No acute abnormality. MEDIASTINUM: Normal heart size. No pericardial effusion. No acute vascular abnormality. No adenopathy. LUNG WINDOWS: Suspect mild emphysema. No consolidation. No effusion. No suspicious pulmonary nodule. UPPER ABDOMEN: No evidence of metastatic disease OSSEOUS: No acute fracture. No suspicious lytic or blastic lesions. IMPRESSION: 1. 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 The mass causes moderate to advanced airway narrowing. For staging, the tumor involves the tongue base, left glossotonsillar sulcus, lingual epiglottis and likely preepiglottic fat. 2. Bilateral cervical adenopathy, levels noted above. A lateral left retropharyngeal lymph node is also suspicious due to asymmetry. 3. No evidence of thoracic metastatic disease. Electronically Signed By: Monte Fantasia M.D. On: 08/08/2014 12:42   Ir Gastrostomy Tube Mod Sed  08/24/2014 CLINICAL DATA: Lingual tonsil mass.  Tracheostomy. EXAM: PERCUTANEOUS GASTROSTOMY FLUOROSCOPY TIME: 5 minutes and 18 seconds MEDICATIONS AND MEDICAL HISTORY: Versed 2 mg, Fentanyl 100 mcg. ANESTHESIA/SEDATION: Moderate sedation time: 40 minutes CONTRAST: 10 cc Omnipaque 300 PROCEDURE: The procedure, risks, benefits, and alternatives were explained to the patient. Questions regarding the procedure were encouraged and answered. The patient understands and consents to the procedure. The epigastrium was prepped with Betadine in a sterile fashion, and a sterile drape was applied covering the operative field. A sterile gown and sterile gloves were used for the procedure. A 5-French orogastric tube is placed under fluoroscopic guidance. Scout imaging of the abdomen confirms contrast from a prior CT scan within the transverse colon. 0.5 mg glucagon was administered. The stomach was distended with gas. Under fluoroscopic guidance, an 18 gauge needle was inserted through the anterior wall of the stomach and into the lumen. It was removed over a T tack. This was repeated for a total of 3 T tacks in a triangular configuration. The 18 gauge needle was advanced into the lumen of the stomach between the T tacks and removed over an Amplatz. The 14 [place] Malik rod gastrostomy tube was inserted. It was a medially noted that there is  latex along the gastrostomy tube. This was quickly cut and exchanged over a new Amplatz for a 14 French pigtail catheter. The patient remained hemodynamically stable, without breathing difficulty, and without rash. She reportedly has a latex allergy characterize by rash. Contrast was injected into the existing gastrostomy tube. COMPLICATIONS: Inadvertent utilization of a gastrostomy tube which contains latex. This was quickly exchanged. The patient suffered no adverse affect. FINDINGS: The image demonstrates placement of a 20-French pull-through type gastrostomy tube into the body of the stomach. IMPRESSION: Successful 14  French antegrade gastrostomy tube. The T tacks can be removed in 1-2 weeks. The tube can be up sized to a 20 [place] balloon retention tube in 6 weeks. Electronically Signed By: Marybelle Killings M.D. On: 08/24/2014 09:44   Dg Chest Port 1 View  08/21/2014 CLINICAL DATA: Tracheostomy evaluation. EXAM: PORTABLE CHEST - 1 VIEW COMPARISON: 08/20/2014. FINDINGS: Tracheostomy good position. Low lung volumes with bibasilar subsegmental atelectasis and trace effusions. Nasogastric tube tip lies below the diaphragm along the greater curvature of the stomach. No pneumothorax. IMPRESSION: Lung volumes are diminished compared with priors. No definite active infiltrates. Electronically Signed By: Rolla Flatten M.D. On: 08/21/2014 07:44   Dg Chest Port 1 View  08/20/2014 CLINICAL DATA: Tracheostomy placement EXAM: PORTABLE CHEST - 1 VIEW COMPARISON: Chest radiograph June 30, 2014 and chest CT August 08, 2014 FINDINGS: Tracheostomy catheter tip is 5.6 cm above the carina. Nasogastric tube tip and side port are in the stomach. No pneumothorax. Lungs are clear. Heart size and pulmonary vascularity are normal. No adenopathy. No bone lesions. IMPRESSION: Tracheostomy as described without pneumothorax. Lungs clear. Electronically Signed By: Lowella Grip III M.D. On: 08/20/2014 14:35   Dg Abd Portable 1v  08/22/2014 CLINICAL DATA: NG tube placement. EXAM: PORTABLE ABDOMEN - 1 VIEW COMPARISON: 08/21/2014 FINDINGS: Enteric tube appears unchanged in position with tip localized over the upper stomach and proximal side hole just below the EG junction. Contrast material is present in the right colon. Stool-filled colon without abnormal distention. No small bowel distention. IMPRESSION: Enteric tube tip appears to be in the upper stomach. Electronically Signed By: Lucienne Capers M.D. On: 08/22/2014 21:02   Dg Abd Portable 1v  08/22/2014 CLINICAL DATA: NG tube placement. EXAM:  PORTABLE ABDOMEN - 1 VIEW COMPARISON: Abdominal radiograph earlier this day. FINDINGS: Imaging focus down the upper abdomen and lower chest for NG tube placement. The enteric tube has been advanced, side-port now just beyond the gastroesophageal junction. Endotracheal tube in place, 5.3 cm from the carina. Bibasilar opacities, may reflect atelectasis, pneumonia, or aspiration. IMPRESSION: Tip and side port of the enteric tube below the diaphragm in the stomach. Electronically Signed By: Jeb Levering M.D. On: 08/22/2014 00:00   Dg Abd Portable 1v  08/21/2014 CLINICAL DATA: NG tube placement. EXAM: PORTABLE ABDOMEN - 1 VIEW COMPARISON: 08/20/2014 FINDINGS: Nonobstructive bowel gas pattern. Moderate stool throughout the colon. No organomegaly or suspicious calcification. NG tube tip is in the distal esophagus near the GE junction. IMPRESSION: NG tube tip in the distal esophagus near the GE junction. Electronically Signed By: Rolm Baptise M.D. On: 08/21/2014 18:54   Dg Abd Portable 1v  08/20/2014 CLINICAL DATA: Nasogastric tube placement EXAM: PORTABLE ABDOMEN - 1 VIEW COMPARISON: None. FINDINGS: Nasogastric tube tip and side port are in the stomach. There is stool throughout the colon. There is an overall paucity of gas. No free air. Lung bases are clear. IMPRESSION: Nasogastric tube tip and side port in stomach. Paucity of gas, a finding that may be  normal but a finding that also may be indicative of enteritis or early ileus. No free air. Electronically Signed By: Lowella Grip III M.D. On: 08/20/2014 14:33   Dg Swallowing Func-speech Pathology  08/27/2014 Assunta Curtis, CCC-SLP 08/27/2014 3:16 PM Objective Swallowing Evaluation: Modified Barium Swallowing Study Patient Details Name: JULIANAH MARCIEL MRN: [id number] Date of Birth: 02/18/1966 Today's Date: 08/27/2014 Time: SLP Start Time (ACUTE ONLY): 1345-SLP Stop Time (ACUTE ONLY): 1414 SLP  Time Calculation (min) (ACUTE ONLY): 29 min Past Medical History: Past Medical History Diagnosis Date . Seizures takes Gabapentin . Heart murmur as a child . Chronic bronchitis . Anxiety panic attacks . Depression . Headache onset a few months ago . 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 Arthritis . Fibromyalgia . Cancer throat cancer . Anemia . Rosacea Past Surgical History: Past Surgical History Procedure Laterality Date . Wisdom tooth extraction . Tonsillectomy Left 08/20/2014 Procedure: TONSILLECTOMY; Surgeon: Ruby Cola, MD; Location: [facility] OR; Service: ENT; Laterality: Left; . Radical neck dissection Left 08/20/2014 Procedure: RADICAL LEFT NECK DISSECTION ; Surgeon: Ruby Cola, MD; Location: [place]; Service: ENT; Laterality: Left; . Tracheostomy tube placement N/A 08/20/2014 Procedure: TRACHEOSTOMY - AWAKE; Surgeon: Ruby Cola, MD; Location: [facility] OR; Service: ENT; Laterality: N/A; HPI: HPI: 49 y.o female presented to the ED on 2/13 with hemoptysis and left neck mass. 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 Pt admitted 2/25 for tracheotomy, biopsy left tongue base/oropharynx mass, left selective neck dissection. Has #6 cuffed trach. Plan is for radiotherapy with chemotherapy; PEG 2/29. Initial MBS 2/27 moderate dysphagia attributable to lingual/pharyngeal mass, post-surgical effects, and presence of open trach (unable to safely use PMV at that time). Pt demonstrated silent aspiration of thin liquids and poor propulsion of solids through pharynx. Now with #6 cuffless trach; tolerating PMV. No Data Recorded Assessment / Plan / Recommendation CHL IP CLINICAL IMPRESSIONS 08/27/2014 Dysphagia Diagnosis Mild pharyngeal phase dysphagia Clinical impression Pt presents with a mild pharyngeal dysphagia, improved since 2/27 study. Continues with trach - #6 cuffless - but now able to  tolerate PMV and used valve throughout study. Notable was reduced hyolaryngeal mobility and the appearance of reduced pharyngeal space. There continues to be vallecular residue post-swallow, but pt demonstrates improved awareness and improved ability to propel material through pharynx and UES. Limited, trace thin-liquid aspiration (just below level of vocal folds and of lesser amount than noted during prior MBS) occurred during the swallow on one occasion. All other thin liquid boluses were consumed safely. Recommend advancing diet to dysphagia 3, thin liquids with PMV in place for all PO consumption. Pt should follow safety precautions with POs. Rec SLP f/u at SNF for education/tx dysphagia associated with head and neck cancer and radiation tx. CHL IP TREATMENT RECOMMENDATION 08/27/2014 Treatment Plan Recommendations Therapy as outlined in treatment plan below CHL IP DIET RECOMMENDATION 08/27/2014 Diet Recommendations Dysphagia 3 (Mechanical Soft);Thin liquid Liquid Administration via Straw;Cup Medication Administration Crushed with puree Compensations Clear throat intermittently;Slow rate;Small sips/bites Postural Changes and/or Swallow Maneuvers Seated upright 90 degrees CHL IP OTHER RECOMMENDATIONS 08/27/2014 Recommended Consults (None) Oral Care Recommendations Oral care BID Other Recommendations (None) CHL IP FOLLOW UP RECOMMENDATIONS 08/27/2014 Follow up Recommendations Skilled Nursing facility [facility] IP FREQUENCY AND DURATION 08/27/2014 Speech Therapy Frequency (ACUTE ONLY) min 3x week Treatment Duration 1 week SLP Swallow Goals No flowsheet data found. No flowsheet data found. CHL IP REASON FOR REFERRAL 08/27/2014 Reason for Referral Objectively evaluate swallowing function CHL IP ORAL PHASE 08/27/2014 Lips (None) Tongue (  None) Mucous membranes (None) Nutritional status (None) Other (None) Oxygen therapy (None) Oral Phase WFL Oral - Pudding  Teaspoon (None) Oral - Pudding Cup (None) Oral - Honey Teaspoon (None) Oral - Honey Cup (None) Oral - Honey Syringe (None) Oral - Nectar Teaspoon (None) Oral - Nectar Cup (None) Oral - Nectar Straw (None) Oral - Nectar Syringe (None) Oral - Ice Chips (None) Oral - Thin Teaspoon (None) Oral - Thin Cup (None) Oral - Thin Straw (None) Oral - Thin Syringe (None) Oral - Puree (None) Oral - Mechanical Soft (None) Oral - Regular (None) Oral - Multi-consistency (None) Oral - Pill (None) Oral Phase - Comment (None) CHL IP PHARYNGEAL PHASE 08/27/2014 Pharyngeal Phase Impaired Pharyngeal - Pudding Teaspoon (None) Penetration/Aspiration details (pudding teaspoon) (None) Pharyngeal - Pudding Cup (None) Penetration/Aspiration details (pudding cup) (None) Pharyngeal - Honey Teaspoon (None) Penetration/Aspiration details (honey teaspoon) (None) Pharyngeal - Honey Cup (None) Penetration/Aspiration details (honey cup) (None) Pharyngeal - Honey Syringe (None) Penetration/Aspiration details (honey syringe) (None) Pharyngeal - Nectar Teaspoon (None) Penetration/Aspiration details (nectar teaspoon) (None) Pharyngeal - Nectar Cup (None) Penetration/Aspiration details (nectar cup) (None) Pharyngeal - Nectar Straw NT Penetration/Aspiration details (nectar straw) (None) Pharyngeal - Nectar Syringe (None) Penetration/Aspiration details (nectar syringe) (None) Pharyngeal - Ice Chips (None) Penetration/Aspiration details (ice chips) (None) Pharyngeal - Thin Teaspoon (None) Penetration/Aspiration details (thin teaspoon) (None) Pharyngeal - Thin Cup (None) Penetration/Aspiration details (thin cup) (None) Pharyngeal - Thin Straw Reduced airway/laryngeal closure;Reduced anterior laryngeal mobility;Reduced epiglottic inversion;Trace aspiration;Pharyngeal residue - valleculae Penetration/Aspiration details (thin straw) Material enters airway, passes BELOW cords without attempt by patient to eject  out (silent aspiration) Pharyngeal - Thin Syringe (None) Penetration/Aspiration details (thin syringe') (None) Pharyngeal - Puree Reduced epiglottic inversion;Reduced anterior laryngeal mobility;Reduced laryngeal elevation;Pharyngeal residue - valleculae Penetration/Aspiration details (puree) (None) Pharyngeal - Mechanical Soft Reduced epiglottic inversion;Reduced anterior laryngeal mobility;Reduced laryngeal elevation;Pharyngeal residue - valleculae Penetration/Aspiration details (mechanical soft) (None) Pharyngeal - Regular (None) Penetration/Aspiration details (regular) (None) Pharyngeal - Multi-consistency (None) Penetration/Aspiration details (multi-consistency) (None) Pharyngeal - Pill (None) Penetration/Aspiration details (pill) (None) Pharyngeal Comment (None) No flowsheet data found. No flowsheet data found. Juan Quam Laurice 08/27/2014, 3:14 PM   Dg Swallowing Func-speech Pathology  08/22/2014 Assunta Curtis, [place] 08/22/2014 3:45 PM Objective Swallowing Evaluation: Modified Barium Swallowing Study Patient Details Name: DANESHIA TAVANO MRN: [id number] Date of Birth: Feb 01, 1966 Today's Date: 08/22/2014 Time: SLP Start Time (ACUTE ONLY): 1230-SLP Stop Time (ACUTE ONLY): 1300 SLP Time Calculation (min) (ACUTE ONLY): 30 min Past Medical History: Past Medical History Diagnosis Date . Seizures takes Gabapentin . Heart murmur as a child . Chronic bronchitis . Anxiety panic attacks . Depression . Headache onset a few months ago . 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 Arthritis . Fibromyalgia . Cancer throat cancer . Anemia . Rosacea Past Surgical History: Past Surgical History Procedure Laterality Date . Wisdom tooth extraction . Tonsillectomy Left 08/20/2014 Procedure: TONSILLECTOMY; Surgeon: Ruby Cola, MD; Location: [facility] OR; Service: ENT; Laterality: Left; . Radical neck dissection Left  08/20/2014 Procedure: RADICAL LEFT NECK DISSECTION ; Surgeon: Ruby Cola, MD; Location: [facility]; Service: ENT; Laterality: Left; . Tracheostomy tube placement N/A 08/20/2014 Procedure: TRACHEOSTOMY - AWAKE; Surgeon: Ruby Cola, MD; Location: [facility] OR; Service: ENT; Laterality: N/A; HPI: HPI: 49 y.o female presented to the ED on 2/13 with hemoptysis and left neck mass. 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 Pt admitted 2/25 for tracheotomy, biopsy left tongue base/oropharynx mass, left selective neck dissection. Has #6 cuffed trach. Plan is for radiotherapy  with chemotherapy; PEG 2/29. No Data Recorded Assessment / Plan / Recommendation CHL IP CLINICAL IMPRESSIONS 08/22/2014 Dysphagia Diagnosis Moderate pharyngeal phase dysphagia Clinical impression Pt presents with a moderate dysphagia attributable to lingual/pharyngeal mass, post-surgical effects, and presence of open trach (unable to safely use PMV at this time). Pt groggy for study, requiring frequent stimulation to remain awake; when awake, she was anxious. Notable was reduced hyolaryngeal mobility and the appearance of reduced pharyngeal space. Repetitive swallows were required in order to propel purees through pharynx and UES. Trace aspiration of thin liquids occurred during and after the swallow; pt without awareness of aspirate. Limited nectar-thick liquids were consumed without penetration/aspiration. Pt is scheduled for trach downsize and PEG 2/29. Smaller trach should allow better tolerance of PMV and may allow more functional eating. (Per ENT report, "true vocal folds are visualized and are pushed to the patient's right but are mobile with no obvious masses and intact adduction and abduction bilaterally.") For now, recommend continuing ice chips, allowing purees and nectar-thick liquids per pt's request. SLP will follow. Pt will need OPSLP to address  mobility of swallowing musculature prior to onset of radiation treatment. CHL IP TREATMENT RECOMMENDATION 08/22/2014 Treatment Plan Recommendations Therapy as outlined in treatment plan below CHL IP DIET RECOMMENDATION 08/22/2014 Diet Recommendations Ice chips PRN after oral care;Dysphagia 1 (Puree);Nectar-thick liquid Liquid Administration via Straw;Cup Medication Administration Crushed with puree Compensations (None) Postural Changes and/or Swallow Maneuvers (None) CHL IP OTHER RECOMMENDATIONS 08/22/2014 Recommended Consults (None) Oral Care Recommendations Oral care Q4 per protocol Other Recommendations Order thickener from pharmacy CHL IP FOLLOW UP RECOMMENDATIONS 08/22/2014 Follow up Recommendations Outpatient SLP CHL IP FREQUENCY AND DURATION 08/22/2014 Speech Therapy Frequency (ACUTE ONLY) min 3x week Treatment Duration 1 week SLP Swallow Goals No flowsheet data found. No flowsheet data found. CHL IP REASON FOR REFERRAL 08/22/2014 Reason for Referral Objectively evaluate swallowing function CHL IP ORAL PHASE 08/22/2014 Lips (None) Tongue (None) Mucous membranes (None) Nutritional status (None) Other (None) Oxygen therapy (None) Oral Phase WFL Oral - Pudding Teaspoon (None) Oral - Pudding Cup (None) Oral - Honey Teaspoon (None) Oral - Honey Cup (None) Oral - Honey Syringe (None) Oral - Nectar Teaspoon (None) Oral - Nectar Cup (None) Oral - Nectar Straw (None) Oral - Nectar Syringe (None) Oral - Ice Chips (None) Oral - Thin Teaspoon (None) Oral - Thin Cup (None) Oral - Thin Straw (None) Oral - Thin Syringe (None) Oral - Puree (None) Oral - Mechanical Soft (None) Oral - Regular (None) Oral - Multi-consistency (None) Oral - Pill (None) Oral Phase - Comment (None) CHL IP PHARYNGEAL PHASE 08/22/2014 Pharyngeal Phase Impaired Pharyngeal - Pudding Teaspoon (None) Penetration/Aspiration details (pudding teaspoon) (None) Pharyngeal -  Pudding Cup (None) Penetration/Aspiration details (pudding cup) (None) Pharyngeal - Honey Teaspoon (None) Penetration/Aspiration details (honey teaspoon) (None) Pharyngeal - Honey Cup (None) Penetration/Aspiration details (honey cup) (None) Pharyngeal - Honey Syringe (None) Penetration/Aspiration details (honey syringe) (None) Pharyngeal - Nectar Teaspoon (None) Penetration/Aspiration details (nectar teaspoon) (None) Pharyngeal - Nectar Cup (None) Penetration/Aspiration details (nectar cup) (None) Pharyngeal - Nectar Straw Reduced epiglottic inversion;Reduced anterior laryngeal mobility;Reduced laryngeal elevation Penetration/Aspiration details (nectar straw) (None) Pharyngeal - Nectar Syringe (None) Penetration/Aspiration details (nectar syringe) (None) Pharyngeal - Ice Chips (None) Penetration/Aspiration details (ice chips) (None) Pharyngeal - Thin Teaspoon (None) Penetration/Aspiration details (thin teaspoon) (None) Pharyngeal - Thin Cup (None) Penetration/Aspiration details (thin cup) (None) Pharyngeal - Thin Straw Reduced epiglottic inversion;Reduced anterior laryngeal mobility;Reduced laryngeal elevation;Reduced airway/laryngeal closure;Penetration/Aspiration after swallow;Penetration/Aspiration during swallow;Trace aspiration Penetration/Aspiration details (thin straw) Material  enters airway, passes BELOW cords without attempt by patient to eject out (silent aspiration) Pharyngeal - Thin Syringe (None) Penetration/Aspiration details (thin syringe') (None) Pharyngeal - Puree Reduced epiglottic inversion;Reduced anterior laryngeal mobility;Reduced laryngeal elevation Penetration/Aspiration details (puree) (None) Pharyngeal - Mechanical Soft (None) Penetration/Aspiration details (mechanical soft) (None) Pharyngeal - Regular (None) Penetration/Aspiration details (regular) (None) Pharyngeal - Multi-consistency (None) Penetration/Aspiration details (multi-consistency) (None)  Pharyngeal - Pill (None) Penetration/Aspiration details (pill) (None) Pharyngeal Comment (None) No flowsheet data found. No flowsheet data found. Amanda L. 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 The patient is a good candidate for radiotherapy. Plan is as below:   1) The patient will meet with med/onc to discuss chemotherapy - anticipate concurrent ChRT if Dr Whitney Muse deems her a satisfactory candidate for chemotherapy (appt 3-21)  1a) PET will be ordered to complete staging. If this revealed distant metastases, it could change our recommendations, as discussed with the patient  2) Referral will be made to dentistry for dental evaluation/extractions in preparation for radiation in the vicinity of the mouth   3) Refer to home health for PEG, trach support and to see nutritionist for nutrition support; anticipate d/c from [facility] health care soon.  4) sucralfate for sore throat Rx today  5) appreciate Abby Potash Hock's help from social work  6) Will refer to outpatient swallowing therapy for evaluation and prophylactic treatment as needed for dysphagia, which can worsen during or after chemoradiotherapy.   7) Simulation / radiotherapy at [facility] in [place], close to pt's home, per her preference. Anticipate 7 weeks of RT - 70 Gy in 35 fractions.   8) Baseline TSH lab due to risk of hypothyroidism from RT. Baseline CBC, BMP.  9) Ativan for anxiety r/t wearing radiotherapy mask  10) Applauded for quitting smoking and ETOH  It was a pleasure meeting the patient today. We discussed the risks, benefits, and side effects of radiotherapy. We talked in detail about acute and late effects. She understands that some of the most bothersome acute  effects will be significant soreness of the mouth and throat, changes in taste, changes in salivary function, skin irritation, hair loss, dehydration, weight loss and fatigue. We talked about late effects which include but are not necessarily limited to dysphagia, hypothyroidism, dry mouth, neck edema and nerve or spinal cord injury. No guarantees of treatment were given. A consent form was signed and placed in the patient's medical record. The patient is enthusiastic about proceeding with treatment. I look forward to participating in the patient's care.  __________________________________________   Eppie Gibson, MD

## 2014-09-17 NOTE — Anesthesia Procedure Notes (Signed)
Date/Time: 09/17/2014 7:40 AM Performed by: Rogers Blocker Pre-anesthesia Checklist: Patient identified, Emergency Drugs available, Suction available, Patient being monitored and Timeout performed Patient Re-evaluated:Patient Re-evaluated prior to inductionOxygen Delivery Method: Circle system utilized Preoxygenation: Pre-oxygenation with 100% oxygen Intubation Type: Tracheostomy Airway Equipment and Method: Tracheostomy Placement Confirmation: positive ETCO2 and breath sounds checked- equal and bilateral ETT to lip (cm): existing 6.0 shiley--uncuffed. Comments: Smooth inhalation induction with existing 6.0 uncuffed Shiley tracheostomy.

## 2014-09-18 ENCOUNTER — Ambulatory Visit (HOSPITAL_COMMUNITY)
Admission: RE | Admit: 2014-09-18 | Discharge: 2014-09-18 | Disposition: A | Discharge status: Home / Self Care | Payer: 59 | Source: Ambulatory Visit | Attending: Hematology & Oncology | Admitting: Hematology & Oncology

## 2014-09-18 ENCOUNTER — Encounter (HOSPITAL_COMMUNITY): Payer: Self-pay

## 2014-09-18 DIAGNOSIS — Z5309 Procedure and treatment not carried out because of other contraindication: Secondary | ICD-10-CM | POA: Diagnosis not present

## 2014-09-18 DIAGNOSIS — C140 Malignant neoplasm of pharynx, unspecified: Secondary | ICD-10-CM

## 2014-09-18 DIAGNOSIS — D72829 Elevated white blood cell count, unspecified: Secondary | ICD-10-CM | POA: Insufficient documentation

## 2014-09-18 DIAGNOSIS — Z452 Encounter for adjustment and management of vascular access device: Secondary | ICD-10-CM | POA: Diagnosis present

## 2014-09-18 DIAGNOSIS — C01 Malignant neoplasm of base of tongue: Secondary | ICD-10-CM | POA: Insufficient documentation

## 2014-09-18 LAB — CBC WITH DIFFERENTIAL/PLATELET
BASOS ABS: 0 10*3/uL (ref 0.0–0.1)
BASOS PCT: 0 % (ref 0–1)
Eosinophils Absolute: 0.3 10*3/uL (ref 0.0–0.7)
Eosinophils Relative: 2 % (ref 0–5)
HEMATOCRIT: 32.3 % — AB (ref 36.0–46.0)
Hemoglobin: 10.4 g/dL — ABNORMAL LOW (ref 12.0–15.0)
Lymphocytes Relative: 8 % — ABNORMAL LOW (ref 12–46)
Lymphs Abs: 1 10*3/uL (ref 0.7–4.0)
MCH: 29.1 pg (ref 26.0–34.0)
MCHC: 32.2 g/dL (ref 30.0–36.0)
MCV: 90.2 fL (ref 78.0–100.0)
Monocytes Absolute: 0.9 10*3/uL (ref 0.1–1.0)
Monocytes Relative: 7 % (ref 3–12)
NEUTROS ABS: 10.2 10*3/uL — AB (ref 1.7–7.7)
NEUTROS PCT: 83 % — AB (ref 43–77)
Platelets: 292 10*3/uL (ref 150–400)
RBC: 3.58 MIL/uL — ABNORMAL LOW (ref 3.87–5.11)
RDW: 15.3 % (ref 11.5–15.5)
WBC: 12.4 10*3/uL — AB (ref 4.0–10.5)

## 2014-09-18 LAB — PROTIME-INR
INR: 1.13 (ref 0.00–1.49)
Prothrombin Time: 14.7 seconds (ref 11.6–15.2)

## 2014-09-18 LAB — APTT: aPTT: 33 seconds (ref 24–37)

## 2014-09-18 MED ORDER — VANCOMYCIN HCL IN DEXTROSE 1-5 GM/200ML-% IV SOLN: 1000.0000 mg | INTRAVENOUS | Status: DC

## 2014-09-18 MED ORDER — SODIUM CHLORIDE 0.9 % IV SOLN
INTRAVENOUS | Status: DC
  Administered 2014-09-18: 12:00:00 via INTRAVENOUS

## 2014-09-18 NOTE — Progress Notes (Signed)
Patient scheduled today for port a catheter placement, chemotherapy to start 10/08/14, patient with increasing wbc and just started on Levaquin 3/23 for concern of 11.2 wbc and possible infection at G-tube site. G-tube site looks good today without significant redness or drainage. Temp low grade today. These results have been discussed with Dr. Anselm Pancoast today and we will hold off on port-a-catheter today with increasing wbc until trending down or normal. Patient is out of town next week and our scheduler will contact her for a rescheduled appointment prior to 4/14, she is to call Dr. Whitney Muse and be seen for rising wbc or with any worsening fevers prior to port placement this has been discussed today and she understands.   Tsosie Billing PA-C Interventional Radiology  09/18/14  12:46 PM

## 2014-09-21 ENCOUNTER — Encounter (HOSPITAL_COMMUNITY): Payer: Self-pay | Admitting: Dentistry

## 2014-09-22 ENCOUNTER — Inpatient Hospital Stay (HOSPITAL_COMMUNITY): Payer: Self-pay

## 2014-09-25 NOTE — Patient Instructions (Addendum)
[facility]   CHEMOTHERAPY INSTRUCTIONS  Premeds: Aloxi - for nausea/vomiting prevention/reduction  Emend - for nausea/vomiting prevention/reduction (Aloxi & Emend typically stay in your body for 48-72 hours)  Dexamethasone - steroid - being given to also decrease nausea/vomiting. Side Effects of Dexamethasone - feeling energized/anxious/nervous/jittery, trouble sleeping, or flushed in the face/neck. Some people turn red in the face/neck. This will pass as the drug wears off.  (takes 30 minutes for pre meds to infuse)  Cisplatin - this medication is hard on your kidneys. This is why we give you a large amount of fluids through your IV while you are here getting chemo. We also need you drinking 64 oz of fluid (preferably water/decaff fluids) 2 days prior to chemo and for up to 4-5 days after chemo. Drink more if you can. This will help keep your kidneys flushed. This can also cause acute and/or delayed nausea/vomiting. You must take your nausea meds as prescribed if you get nauseated. Do not wait until you start vomiting. This med can also cause peripheral neuropathy (numbness/tingling/burning in hands/fingers/feet/toes). Let us know if this develops so that we can monitor it and treat if necessary.   POTENTIAL SIDE EFFECTS OF TREATMENT: Increased Susceptibility to Infection, Vomiting, Constipation, Hair Thinning, Changes in Character of Skin and Nails (brittleness, dryness,etc.), Bone Marrow Suppression, Nausea, Diarrhea and Mouth Sores   EDUCATIONAL MATERIALS GIVEN AND REVIEWED: Chemotherapy and You booklet Specific Instructions Sheets: Cisplatin, Aloxi, Emend, Dexamethasone, EMLA cream, Zofran, Compazine   SELF CARE ACTIVITIES WHILE ON CHEMOTHERAPY: Increase your fluid intake 48 hours prior to treatment and drink at least 2 quarts per day after treatment., No alcohol intake., No aspirin or other medications unless approved by your oncologist., Eat foods that  are light and easy to digest., Eat foods at cold or room temperature., No fried, fatty, or spicy foods immediately before or after treatment., Have teeth cleaned professionally before starting treatment. Keep dentures and partial plates clean., Use soft toothbrush and do not use mouthwashes that contain alcohol. Biotene is a good mouthwash that is available at most pharmacies or may be ordered by calling [phone]., Use warm salt water gargles (1 teaspoon salt per 1 quart warm water) before and after meals and at bedtime. Or you may rinse with 2 tablespoons of three -percent hydrogen peroxide mixed in eight ounces of water., Always use sunscreen with SPF (Sun Protection Factor) of 30 or higher., Use your nausea medication as directed to prevent nausea., Use your stool softener or laxative as directed to prevent constipation. and Use your anti-diarrheal medication as directed to stop diarrhea.  Please wash your hands for at least 30 seconds using warm soapy water. Handwashing is the #1 way to prevent the spread of germs. Stay away from sick people or people who are getting over a cold. If you develop respiratory systems such as green/yellow mucus production or productive cough or persistent cough let us know and we will see if you need an antibiotic. It is a good idea to keep a pair of gloves on when going into grocery stores/[employer] to decrease your risk of coming into contact with germs on the carts, etc. Carry alcohol hand gel with you at all times and use it frequently if out in public. All foods need to be cooked thoroughly. No raw foods. No medium or undercooked meats, eggs. If your food is cooked medium well, it does not need to be hot pink or saturated with bloody liquid at  all. Vegetables and fruits need to be washed/rinsed under the faucet with a dish detergent before being consumed. You can eat raw fruits and vegetables unless we tell you otherwise but it would be best if you cooked them or bought  frozen. Do not eat off of salad bars or hot bars unless you really trust the cleanliness of the restaurant. If you need dental work, please let Dr. Whitney Muse know before you go for your appointment so that we can coordinate the best possible time for you in regards to your chemo regimen. You need to also let your dentist know that you are actively taking chemo. We may need to do labs prior to your dental appointment. We also want your bowels moving at least every other day. If this is not happening, we need to know so that we can get you on a bowel regimen to help you go.    MEDICATIONS: You have been given prescriptions for the following medications:  Zofran/Ondansetron 8mg  tablet. Take 1 tablet every 8 hours as needed for nausea/vomiting.   Compazine/Prochlorperazine 10mg  tablet. Take 1 tablet every 6 hours as needed for nausea/vomiting.   EMLA cream. Apply a quarter size amount to port site 1 hour prior to chemo. Do not rub in. Cover with plastic wrap.  Over-the-Counter Meds:  Colace - this is a stool softener. Take 100mg  capsule 2-6 times a day as needed. If you have to take more than 6 capsules of Colace a day call the [facility].  Senna - this is a mild laxative used to treat mild constipation. May take 4 tabs by mouth twice a day (total of 8 tabs) as needed for mild constipation.  Milk of Magnesia - this is a laxative used to treat moderate to severe constipation. May take 2-4 tablespoons every 8 hours as needed. May increase to 8 tablespoons x 1 dose and if no bowel movement call the [facility].  Imodium - this is for diarrhea. Take 2 tabs after 1st loose stool and then 1 tab every 2 hours until you go a total of 12 hours without a loose stool. Call [facility] if loose stools continue.     SYMPTOMS TO REPORT AS SOON AS POSSIBLE AFTER TREATMENT:  FEVER GREATER THAN 100.5 F  CHILLS WITH OR WITHOUT FEVER  NAUSEA AND VOMITING THAT IS NOT CONTROLLED WITH YOUR NAUSEA  MEDICATION  UNUSUAL SHORTNESS OF BREATH  UNUSUAL BRUISING OR BLEEDING  TENDERNESS IN MOUTH AND THROAT WITH OR WITHOUT PRESENCE OF ULCERS  URINARY PROBLEMS  BOWEL PROBLEMS  UNUSUAL RASH    Wear comfortable clothing and clothing appropriate for easy access to any Portacath or PICC line. Let us know if there is anything that we can do to make your therapy better!      I have been informed and understand all of the instructions given to me and have received a copy. I have been instructed to call the clinic [phone] or my family physician as soon as possible for continued medical care, if indicated. I do not have any more questions at this time but understand that I may call the [facility] or the Patient Navigator at [phone] during office hours should I have questions or need assistance in obtaining follow-up care.          Cisplatin injection What is this medicine? CISPLATIN (SIS pla tin) is a chemotherapy drug. It targets fast dividing cells, like cancer cells, and causes these cells to die. This medicine  is used to treat many types of cancer like bladder, ovarian, and testicular cancers. This medicine may be used for other purposes; ask your health care provider or pharmacist if you have questions. COMMON BRAND NAME(S): Platinol, Platinol -AQ What should I tell my health care provider before I take this medicine? They need to know if you have any of these conditions: -blood disorders -hearing problems -kidney disease -recent or ongoing radiation therapy -an unusual or allergic reaction to cisplatin, carboplatin, other chemotherapy, other medicines, foods, dyes, or preservatives -pregnant or trying to get pregnant -breast-feeding How should I use this medicine? This drug is given as an infusion into a vein. It is administered in a hospital or clinic by a specially trained health care professional. Talk to your pediatrician regarding the use of this medicine  in children. Special care may be needed. Overdosage: If you think you have taken too much of this medicine contact a poison control center or emergency room at once. NOTE: This medicine is only for you. Do not share this medicine with others. What if I miss a dose? It is important not to miss a dose. Call your doctor or health care professional if you are unable to keep an appointment. What may interact with this medicine? -dofetilide -foscarnet -medicines for seizures -medicines to increase blood counts like filgrastim, pegfilgrastim, sargramostim -probenecid -pyridoxine used with altretamine -rituximab -some antibiotics like amikacin, gentamicin, neomycin, polymyxin B, streptomycin, tobramycin -sulfinpyrazone -vaccines -zalcitabine Talk to your doctor or health care professional before taking any of these medicines: -acetaminophen -aspirin -ibuprofen -ketoprofen -naproxen This list may not describe all possible interactions. Give your health care provider a list of all the medicines, herbs, non-prescription drugs, or dietary supplements you use. Also tell them if you smoke, drink alcohol, or use illegal drugs. Some items may interact with your medicine. What should I watch for while using this medicine? Your condition will be monitored carefully while you are receiving this medicine. You will need important blood work done while you are taking this medicine. This drug may make you feel generally unwell. This is not uncommon, as chemotherapy can affect healthy cells as well as cancer cells. Report any side effects. Continue your course of treatment even though you feel ill unless your doctor tells you to stop. In some cases, you may be given additional medicines to help with side effects. Follow all directions for their use. Call your doctor or health care professional for advice if you get a fever, chills or sore throat, or other symptoms of a cold or flu. Do not treat yourself. This  drug decreases your body's ability to fight infections. Try to avoid being around people who are sick. This medicine may increase your risk to bruise or bleed. Call your doctor or health care professional if you notice any unusual bleeding. Be careful brushing and flossing your teeth or using a toothpick because you may get an infection or bleed more easily. If you have any dental work done, tell your dentist you are receiving this medicine. Avoid taking products that contain aspirin, acetaminophen, ibuprofen, naproxen, or ketoprofen unless instructed by your doctor. These medicines may hide a fever. Do not become pregnant while taking this medicine. Women should inform their doctor if they wish to become pregnant or think they might be pregnant. There is a potential for serious side effects to an unborn child. Talk to your health care professional or pharmacist for more information. Do not breast-feed an infant while taking this medicine.  Drink fluids as directed while you are taking this medicine. This will help protect your kidneys. Call your doctor or health care professional if you get diarrhea. Do not treat yourself. What side effects may I notice from receiving this medicine? Side effects that you should report to your doctor or health care professional as soon as possible: -allergic reactions like skin rash, itching or hives, swelling of the face, lips, or tongue -signs of infection - fever or chills, cough, sore throat, pain or difficulty passing urine -signs of decreased platelets or bleeding - bruising, pinpoint red spots on the skin, black, tarry stools, nosebleeds -signs of decreased red blood cells - unusually weak or tired, fainting spells, lightheadedness -breathing problems -changes in hearing -gout pain -low blood counts - This drug may decrease the number of white blood cells, red blood cells and platelets. You may be at increased risk for infections and bleeding. -nausea and  vomiting -pain, swelling, redness or irritation at the injection site -pain, tingling, numbness in the hands or feet -problems with balance, movement -trouble passing urine or change in the amount of urine Side effects that usually do not require medical attention (report to your doctor or health care professional if they continue or are bothersome): -changes in vision -loss of appetite -metallic taste in the mouth or changes in taste This list may not describe all possible side effects. Call your doctor for medical advice about side effects. You may report side effects to FDA at 1-800-FDA-1088. Where should I keep my medicine? This drug is given in a hospital or clinic and will not be stored at home. NOTE: This sheet is a summary. It may not cover all possible information. If you have questions about this medicine, talk to your doctor, pharmacist, or health care provider.  2015, Elsevier/Gold Standard. (2007-09-17 14:40:54) Palonosetron Injection What is this medicine? PALONOSETRON (pal oh NOE se tron) is used to prevent nausea and vomiting caused by chemotherapy. It also helps prevent delayed nausea and vomiting that may occur a few days after your treatment. This medicine may be used for other purposes; ask your health care provider or pharmacist if you have questions. COMMON BRAND NAME(S): Aloxi What should I tell my health care provider before I take this medicine? They need to know if you have any of these conditions: -an unusual or allergic reaction to palonosetron, dolasetron, granisetron, ondansetron, other medicines, foods, dyes, or preservatives -pregnant or trying to get pregnant -breast-feeding How should I use this medicine? This medicine is for infusion into a vein. It is given by a health care professional in a hospital or clinic setting. Talk to your pediatrician regarding the use of this medicine in children. While this drug may be prescribed for children as young as 1  month for selected conditions, precautions do apply. Overdosage: If you think you have taken too much of this medicine contact a poison control center or emergency room at once. NOTE: This medicine is only for you. Do not share this medicine with others. What if I miss a dose? This does not apply. What may interact with this medicine? -certain medicines for depression, anxiety, or psychotic disturbances -fentanyl -linezolid -MAOIs like Carbex, Eldepryl, Marplan, Nardil, and Parnate -methylene blue (injected into a vein) -tramadol This list may not describe all possible interactions. Give your health care provider a list of all the medicines, herbs, non-prescription drugs, or dietary supplements you use. Also tell them if you smoke, drink alcohol, or use illegal drugs. Some items  may interact with your medicine. What should I watch for while using this medicine? Your condition will be monitored carefully while you are receiving this medicine. What side effects may I notice from receiving this medicine? Side effects that you should report to your doctor or health care professional as soon as possible: -allergic reactions like skin rash, itching or hives, swelling of the face, lips, or tongue -breathing problems -confusion -dizziness -fast, irregular heartbeat -fever and chills -loss of balance or coordination -seizures -sweating -swelling of the hands and feet -tremors -unusually weak or tired Side effects that usually do not require medical attention (report to your doctor or health care professional if they continue or are bothersome): -constipation or diarrhea -headache This list may not describe all possible side effects. Call your doctor for medical advice about side effects. You may report side effects to FDA at 1-800-FDA-1088. Where should I keep my medicine? This drug is given in a hospital or clinic and will not be stored at home. NOTE: This sheet is a summary. It may not  cover all possible information. If you have questions about this medicine, talk to your doctor, pharmacist, or health care provider.  2015, Elsevier/Gold Standard. (2013-04-18 10:38:36) Fosaprepitant injection What is this medicine? FOSAPREPITANT (fos ap RE pi tant) is used together with other medicines to prevent nausea and vomiting caused by cancer treatment (chemotherapy). This medicine may be used for other purposes; ask your health care provider or pharmacist if you have questions. COMMON BRAND NAME(S): Emend What should I tell my health care provider before I take this medicine? They need to know if you have any of these conditions: -liver disease -an unusual or allergic reaction to fosaprepitant, aprepitant, medicines, foods, dyes, or preservatives -pregnant or trying to get pregnant -breast-feeding How should I use this medicine? This medicine is for injection into a vein. It is given by a health care professional in a hospital or clinic setting. Talk to your pediatrician regarding the use of this medicine in children. Special care may be needed. Overdosage: If you think you have taken too much of this medicine contact a poison control center or emergency room at once. NOTE: This medicine is only for you. Do not share this medicine with others. What if I miss a dose? This does not apply. What may interact with this medicine? Do not take this medicine with any of these medicines: -cisapride -pimozide -ranolazine This medicine may also interact with the following medications: -diltiazem -female hormones, like estrogens or progestins and birth control pills -medicines for fungal infections like ketoconazole and itraconazole -medicines for HIV -medicines for seizures or to control epilepsy like carbamazepine or phenytoin -medicines used for sleep or anxiety disorders like alprazolam, diazepam, or midazolam -nefazodone -paroxetine -rifampin -some chemotherapy medications like  etoposide, ifosfamide, vinblastine, vincristine -some antibiotics like clarithromycin, erythromycin, troleandomycin -steroid medicines like dexamethasone or methylprednisolone -tolbutamide -warfarin This list may not describe all possible interactions. Give your health care provider a list of all the medicines, herbs, non-prescription drugs, or dietary supplements you use. Also tell them if you smoke, drink alcohol, or use illegal drugs. Some items may interact with your medicine. What should I watch for while using this medicine? Do not take this medicine if you already have nausea and vomiting. Ask your health care provider what to do if you already have nausea. Birth control pills may not work properly while you are taking this medicine. Talk to your doctor about using an extra method of birth control.  This medicine should not be used continuously for a long time. Visit your doctor or health care professional for regular check-ups. This medicine may change your liver function blood test results. What side effects may I notice from receiving this medicine? Side effects that you should report to your doctor or health care professional as soon as possible: -allergic reactions like skin rash, itching or hives, swelling of the face, lips, or tongue -breathing problems -changes in heart rhythm -high or low blood pressure -pain, redness, or irritation at site where injected -rectal bleeding -serious dizziness or disorientation, confusion -sharp or severe stomach pain -sharp pain in your leg Side effects that usually do not require medical attention (report to your doctor or health care professional if they continue or are bothersome): -constipation or diarrhea -hair loss -headache -hiccups -loss of appetite -nausea -upset stomach -tiredness This list may not describe all possible side effects. Call your doctor for medical advice about side effects. You may report side effects to FDA at  1-800-FDA-1088. Where should I keep my medicine? This drug is given in a hospital or clinic and will not be stored at home. NOTE: This sheet is a summary. It may not cover all possible information. If you have questions about this medicine, talk to your doctor, pharmacist, or health care provider.  2015, Elsevier/Gold Standard. (2009-05-25 12:46:13) Dexamethasone injection What is this medicine? DEXAMETHASONE (dex a METH a sone) is a corticosteroid. It is used to treat inflammation of the skin, joints, lungs, and other organs. Common conditions treated include asthma, allergies, and arthritis. It is also used for other conditions, like blood disorders and diseases of the adrenal glands. This medicine may be used for other purposes; ask your health care provider or pharmacist if you have questions. COMMON BRAND NAME(S): Decadron, Solurex What should I tell my health care provider before I take this medicine? They need to know if you have any of these conditions: -blood clotting problems -Cushing's syndrome -diabetes -glaucoma -heart problems or disease -high blood pressure -infection like herpes, measles, tuberculosis, or chickenpox -kidney disease -liver disease -mental problems -myasthenia gravis -osteoporosis -previous heart attack -seizures -stomach, ulcer or intestine disease including colitis and diverticulitis -thyroid problem -an unusual or allergic reaction to dexamethasone, corticosteroids, other medicines, lactose, foods, dyes, or preservatives -pregnant or trying to get pregnant -breast-feeding How should I use this medicine? This medicine is for injection into a muscle, joint, lesion, soft tissue, or vein. It is given by a health care professional in a hospital or clinic setting. Talk to your pediatrician regarding the use of this medicine in children. Special care may be needed. Overdosage: If you think you have taken too much of this medicine contact a poison control  center or emergency room at once. NOTE: This medicine is only for you. Do not share this medicine with others. What if I miss a dose? This may not apply. If you are having a series of injections over a prolonged period, try not to miss an appointment. Call your doctor or health care professional to reschedule if you are unable to keep an appointment. What may interact with this medicine? Do not take this medicine with any of the following medications: -mifepristone, RU-486 -vaccines This medicine may also interact with the following medications: -amphotericin B -antibiotics like clarithromycin, erythromycin, and troleandomycin -aspirin and aspirin-like drugs -barbiturates like phenobarbital -carbamazepine -cholestyramine -cholinesterase inhibitors like donepezil, galantamine, rivastigmine, and tacrine -cyclosporine -digoxin -diuretics -ephedrine -female hormones, like estrogens or progestins and birth control pills -  indinavir -isoniazid -ketoconazole -medicines for diabetes -medicines that improve muscle tone or strength for conditions like myasthenia gravis -NSAIDs, medicines for pain and inflammation, like ibuprofen or naproxen -phenytoin -rifampin -thalidomide -warfarin This list may not describe all possible interactions. Give your health care provider a list of all the medicines, herbs, non-prescription drugs, or dietary supplements you use. Also tell them if you smoke, drink alcohol, or use illegal drugs. Some items may interact with your medicine. What should I watch for while using this medicine? Your condition will be monitored carefully while you are receiving this medicine. If you are taking this medicine for a long time, carry an identification card with your name and address, the type and dose of your medicine, and your doctor's name and address. This medicine may increase your risk of getting an infection. Stay away from people who are sick. Tell your doctor or health  care professional if you are around anyone with measles or chickenpox. Talk to your health care provider before you get any vaccines that you take this medicine. If you are going to have surgery, tell your doctor or health care professional that you have taken this medicine within the last twelve months. Ask your doctor or health care professional about your diet. You may need to lower the amount of salt you eat. The medicine can increase your blood sugar. If you are a diabetic check with your doctor if you need help adjusting the dose of your diabetic medicine. What side effects may I notice from receiving this medicine? Side effects that you should report to your doctor or health care professional as soon as possible: -allergic reactions like skin rash, itching or hives, swelling of the face, lips, or tongue -black or tarry stools -change in the amount of urine -changes in vision -confusion, excitement, restlessness, a false sense of well-being -fever, sore throat, sneezing, cough, or other signs of infection, wounds that will not heal -hallucinations -increased thirst -mental depression, mood swings, mistaken feelings of self importance or of being mistreated -pain in hips, back, ribs, arms, shoulders, or legs -pain, redness, or irritation at the injection site -redness, blistering, peeling or loosening of the skin, including inside the mouth -rounding out of face -swelling of feet or lower legs -unusual bleeding or bruising -unusual tired or weak -wounds that do not heal Side effects that usually do not require medical attention (report to your doctor or health care professional if they continue or are bothersome): -diarrhea or constipation -change in taste -headache -nausea, vomiting -skin problems, acne, thin and shiny skin -touble sleeping -unusual growth of hair on the face or body -weight gain This list may not describe all possible side effects. Call your doctor for medical  advice about side effects. You may report side effects to FDA at 1-800-FDA-1088. Where should I keep my medicine? This drug is given in a hospital or clinic and will not be stored at home. NOTE: This sheet is a summary. It may not cover all possible information. If you have questions about this medicine, talk to your doctor, pharmacist, or health care provider.  2015, Elsevier/Gold Standard. (2007-10-03 14:04:12) Lidocaine; Prilocaine cream What is this medicine? LIDOCAINE; PRILOCAINE (LYE doe kane; PRIL oh kane) is a topical anesthetic that causes loss of feeling in the skin and surrounding tissues. It is used to numb the skin before procedures or injections. This medicine may be used for other purposes; ask your health care provider or pharmacist if you have questions. COMMON BRAND NAME(S):  EMLA What should I tell my health care provider before I take this medicine? They need to know if you have any of these conditions: -glucose-6-phosphate deficiencies -heart disease -kidney or liver disease -methemoglobinemia -an unusual or allergic reaction to lidocaine, prilocaine, other medicines, foods, dyes, or preservatives -pregnant or trying to get pregnant -breast-feeding How should I use this medicine? This medicine is for external use only on the skin. Do not take by mouth. Follow the directions on the prescription label. Wash hands before and after use. Do not use more or leave in contact with the skin longer than directed. Do not apply to eyes or open wounds. It can cause irritation and blurred or temporary loss of vision. If this medicine comes in contact with your eyes, immediately rinse the eye with water. Do not touch or rub the eye. Contact your health care provider right away. Talk to your pediatrician regarding the use of this medicine in children. While this medicine may be prescribed for children for selected conditions, precautions do apply. Overdosage: If you think you have taken  too much of this medicine contact a poison control center or emergency room at once. NOTE: This medicine is only for you. Do not share this medicine with others. What if I miss a dose? This medicine is usually only applied once prior to each procedure. It must be in contact with the skin for a period of time for it to work. If you applied this medicine later than directed, tell your health care professional before starting the procedure. What may interact with this medicine? -acetaminophen -chloroquine -dapsone -medicines to control heart rhythm -nitrates like nitroglycerin and nitroprusside -other ointments, creams, or sprays that may contain anesthetic medicine -phenobarbital -phenytoin -quinine -sulfonamides like sulfacetamide, sulfamethoxazole, sulfasalazine and others This list may not describe all possible interactions. Give your health care provider a list of all the medicines, herbs, non-prescription drugs, or dietary supplements you use. Also tell them if you smoke, drink alcohol, or use illegal drugs. Some items may interact with your medicine. What should I watch for while using this medicine? Be careful to avoid injury to the treated area while it is numb and you are not aware of pain. Avoid scratching, rubbing, or exposing the treated area to hot or cold temperatures until complete sensation has returned. The numb feeling will wear off a few hours after applying the cream. What side effects may I notice from receiving this medicine? Side effects that you should report to your doctor or health care professional as soon as possible: -blurred vision -chest pain -difficulty breathing -dizziness -drowsiness -fast or irregular heartbeat -skin rash or itching -swelling of your throat, lips, or face -trembling Side effects that usually do not require medical attention (report to your doctor or health care professional if they continue or are bothersome): -changes in ability to feel  hot or cold -redness and swelling at the application site This list may not describe all possible side effects. Call your doctor for medical advice about side effects. You may report side effects to FDA at 1-800-FDA-1088. Where should I keep my medicine? Keep out of reach of children. Store at room temperature between 15 and 30 degrees C (59 and 86 degrees F). Keep container tightly closed. Throw away any unused medicine after the expiration date. NOTE: This sheet is a summary. It may not cover all possible information. If you have questions about this medicine, talk to your doctor, pharmacist, or health care provider.  2015, Elsevier/Gold Standard. (  2007-12-16 17:14:35) Prochlorperazine tablets What is this medicine? PROCHLORPERAZINE (proe klor PER a zeen) helps to control severe nausea and vomiting. This medicine is also used to treat schizophrenia. It can also help patients who experience anxiety that is not due to psychological illness. This medicine may be used for other purposes; ask your health care provider or pharmacist if you have questions. COMMON BRAND NAME(S): Compazine What should I tell my health care provider before I take this medicine? They need to know if you have any of these conditions: -blood disorders or disease -dementia -liver disease or jaundice -Parkinson's disease -uncontrollable movement disorder -an unusual or allergic reaction to prochlorperazine, other medicines, foods, dyes, or preservatives -pregnant or trying to get pregnant -breast-feeding How should I use this medicine? Take this medicine by mouth with a glass of water. Follow the directions on the prescription label. Take your doses at regular intervals. Do not take your medicine more often than directed. Do not stop taking this medicine suddenly. This can cause nausea, vomiting, and dizziness. Ask your doctor or health care professional for advice. Talk to your pediatrician regarding the use of this  medicine in children. Special care may be needed. While this drug may be prescribed for children as young as 2 years for selected conditions, precautions do apply. Overdosage: If you think you have taken too much of this medicine contact a poison control center or emergency room at once. NOTE: This medicine is only for you. Do not share this medicine with others. What if I miss a dose? If you miss a dose, take it as soon as you can. If it is almost time for your next dose, take only that dose. Do not take double or extra doses. What may interact with this medicine? Do not take this medicine with any of the following medications: -amoxapine -antidepressants like citalopram, escitalopram, fluoxetine, paroxetine, and sertraline -deferoxamine -dofetilide -maprotiline -tricyclic antidepressants like amitriptyline, clomipramine, imipramine, nortiptyline and others This medicine may also interact with the following medications: -lithium -medicines for pain -phenytoin -propranolol -warfarin This list may not describe all possible interactions. Give your health care provider a list of all the medicines, herbs, non-prescription drugs, or dietary supplements you use. Also tell them if you smoke, drink alcohol, or use illegal drugs. Some items may interact with your medicine. What should I watch for while using this medicine? Visit your doctor or health care professional for regular checks on your progress. You may get drowsy or dizzy. Do not drive, use machinery, or do anything that needs mental alertness until you know how this medicine affects you. Do not stand or sit up quickly, especially if you are an older patient. This reduces the risk of dizzy or fainting spells. Alcohol may interfere with the effect of this medicine. Avoid alcoholic drinks. This medicine can reduce the response of your body to heat or cold. Dress warm in cold weather and stay hydrated in hot weather. If possible, avoid extreme  temperatures like saunas, hot tubs, very hot or cold showers, or activities that can cause dehydration such as vigorous exercise. This medicine can make you more sensitive to the sun. Keep out of the sun. If you cannot avoid being in the sun, wear protective clothing and use sunscreen. Do not use sun lamps or tanning beds/booths. Your mouth may get dry. Chewing sugarless gum or sucking hard candy, and drinking plenty of water may help. Contact your doctor if the problem does not go away or is severe. What side  effects may I notice from receiving this medicine? Side effects that you should report to your doctor or health care professional as soon as possible: -blurred vision -breast enlargement in men or women -breast milk in women who are not breast-feeding -chest pain, fast or irregular heartbeat -confusion, restlessness -dark yellow or brown urine -difficulty breathing or swallowing -dizziness or fainting spells -drooling, shaking, movement difficulty (shuffling walk) or rigidity -fever, chills, sore throat -involuntary or uncontrollable movements of the eyes, mouth, head, arms, and legs -seizures -stomach area pain -unusually weak or tired -unusual bleeding or bruising -yellowing of skin or eyes Side effects that usually do not require medical attention (report to your doctor or health care professional if they continue or are bothersome): -difficulty passing urine -difficulty sleeping -headache -sexual dysfunction -skin rash, or itching This list may not describe all possible side effects. Call your doctor for medical advice about side effects. You may report side effects to FDA at 1-800-FDA-1088. Where should I keep my medicine? Keep out of the reach of children. Store at room temperature between 15 and 30 degrees C (59 and 86 degrees F). Protect from light. Throw away any unused medicine after the expiration date. NOTE: This sheet is a summary. It may not cover all possible  information. If you have questions about this medicine, talk to your doctor, pharmacist, or health care provider.  2015, Elsevier/Gold Standard. (2011-10-31 16:59:39) Ondansetron oral dissolving tablet What is this medicine? ONDANSETRON (on DAN se tron) is used to treat nausea and vomiting caused by chemotherapy. It is also used to prevent or treat nausea and vomiting after surgery. This medicine may be used for other purposes; ask your health care provider or pharmacist if you have questions. COMMON BRAND NAME(S): Zofran ODT What should I tell my health care provider before I take this medicine? They need to know if you have any of these conditions: -heart disease -history of irregular heartbeat -liver disease -low levels of magnesium or potassium in the blood -an unusual or allergic reaction to ondansetron, granisetron, other medicines, foods, dyes, or preservatives -pregnant or trying to get pregnant -breast-feeding How should I use this medicine? These tablets are made to dissolve in the mouth. Do not try to push the tablet through the foil backing. With dry hands, peel away the foil backing and gently remove the tablet. Place the tablet in the mouth and allow it to dissolve, then swallow. While you may take these tablets with water, it is not necessary to do so. Talk to your pediatrician regarding the use of this medicine in children. Special care may be needed. Overdosage: If you think you have taken too much of this medicine contact a poison control center or emergency room at once. NOTE: This medicine is only for you. Do not share this medicine with others. What if I miss a dose? If you miss a dose, take it as soon as you can. If it is almost time for your next dose, take only that dose. Do not take double or extra doses. What may interact with this medicine? Do not take this medicine with any of the following medications: -apomorphine -certain medicines for fungal infections like  fluconazole, itraconazole, ketoconazole, posaconazole, voriconazole -cisapride -dofetilide -dronedarone -pimozide -thioridazine -ziprasidone This medicine may also interact with the following medications: -carbamazepine -certain medicines for depression, anxiety, or psychotic disturbances -fentanyl -linezolid -MAOIs like Carbex, Eldepryl, Marplan, Nardil, and Parnate -methylene blue (injected into a vein) -other medicines that prolong the QT interval (cause an  abnormal heart rhythm) -phenytoin -rifampicin -tramadol This list may not describe all possible interactions. Give your health care provider a list of all the medicines, herbs, non-prescription drugs, or dietary supplements you use. Also tell them if you smoke, drink alcohol, or use illegal drugs. Some items may interact with your medicine. What should I watch for while using this medicine? Check with your doctor or health care professional as soon as you can if you have any sign of an allergic reaction. What side effects may I notice from receiving this medicine? Side effects that you should report to your doctor or health care professional as soon as possible: -allergic reactions like skin rash, itching or hives, swelling of the face, lips, or tongue -breathing problems -confusion -dizziness -fast or irregular heartbeat -feeling faint or lightheaded, falls -fever and chills -loss of balance or coordination -seizures -sweating -swelling of the hands and feet -tightness in the chest -tremors -unusually weak or tired Side effects that usually do not require medical attention (report to your doctor or health care professional if they continue or are bothersome): -constipation or diarrhea -headache This list may not describe all possible side effects. Call your doctor for medical advice about side effects. You may report side effects to FDA at 1-800-FDA-1088. Where should I keep my medicine? Keep out of the reach of  children. Store between 2 and 30 degrees C (36 and 86 degrees F). Throw away any unused medicine after the expiration date. NOTE: This sheet is a summary. It may not cover all possible information. If you have questions about this medicine, talk to your doctor, pharmacist, or health care provider.  2015, Elsevier/Gold Standard. (2013-03-19 16:21:52)

## 2014-09-28 ENCOUNTER — Ambulatory Visit (HOSPITAL_COMMUNITY): Payer: Self-pay | Admitting: Dentistry

## 2014-09-28 ENCOUNTER — Ambulatory Visit (HOSPITAL_COMMUNITY): Payer: Self-pay

## 2014-09-29 ENCOUNTER — Ambulatory Visit (HOSPITAL_COMMUNITY): Payer: Self-pay | Admitting: Dentistry

## 2014-09-29 ENCOUNTER — Ambulatory Visit (HOSPITAL_COMMUNITY): Payer: Self-pay | Admitting: Hematology & Oncology

## 2014-09-29 ENCOUNTER — Inpatient Hospital Stay (HOSPITAL_COMMUNITY): Payer: Self-pay

## 2014-09-29 VITALS — BP 120/82 | HR 96 | Temp 98.1°F

## 2014-09-29 DIAGNOSIS — Z463 Encounter for fitting and adjustment of dental prosthetic device: Secondary | ICD-10-CM

## 2014-09-29 DIAGNOSIS — Z01818 Encounter for other preprocedural examination: Secondary | ICD-10-CM

## 2014-09-29 DIAGNOSIS — C01 Malignant neoplasm of base of tongue: Secondary | ICD-10-CM | POA: Diagnosis not present

## 2014-09-29 DIAGNOSIS — K08409 Partial loss of teeth, unspecified cause, unspecified class: Secondary | ICD-10-CM

## 2014-09-29 MED ORDER — SODIUM FLUORIDE 1.1 % DT GEL: DENTAL | Status: DC

## 2014-09-29 NOTE — Progress Notes (Signed)
POST OPERATIVE NOTE:  09/29/2014   Shelby Larson [id number]  VITALS: BP 120/82 mmHg  Pulse 96  Temp(Src) 98.1 F (36.7 C) (Oral)  LABS:  Lab Results  Component Value Date   WBC 12.4* 09/18/2014   HGB 10.4* 09/18/2014   HCT 32.3* 09/18/2014   MCV 90.2 09/18/2014   PLT 292 09/18/2014   BMET    Component Value Date/Time   NA 134* 09/14/2014 1600   NA 137 09/02/2014 1007   K 4.2 09/14/2014 1600   K 4.5 09/02/2014 1007   CL 99 09/14/2014 1600   CO2 25 09/14/2014 1600   CO2 27 09/02/2014 1007   GLUCOSE 76 09/14/2014 1600   GLUCOSE 87 09/02/2014 1007   BUN 17 09/14/2014 1600   BUN 10.7 09/02/2014 1007   CREATININE 0.74 09/14/2014 1600   CREATININE 0.8 09/02/2014 1007   CALCIUM 9.9 09/14/2014 1600   CALCIUM 10.1 09/02/2014 1007   GFRNONAA >90 09/14/2014 1600   GFRAA >90 09/14/2014 1600    Lab Results  Component Value Date   INR 1.13 09/18/2014   INR 1.11 08/20/2014   No results found for: PTT   RHILEY TARVER is status post multiple extractions with alveoloplasty, pre-prosthetic surgery as needed, and gross debridement of remaining dentition the operating room. The patient now presents for evaluation of healing, suture removal as needed, PA of #31 to rule out retained root tip, and insertion of upper and lower fluoride trays.  SUBJECTIVE: Patient with minimal discomfort. Patient had stimulation this morning.   EXAM: There is no sign of infection, heme, or ooze. Sutures are loosely intact. Extraction sites appear to be healing in well with generalized primary closure but multiple areas of healing in by secondary intention. Periapical Radiographic #31 did not reveal any retained root tip.  Patient has relatively good oral hygiene today.  PROCEDURE: The patient was given a chlorhexidine gluconate rinse for 30 seconds. Sutures were then removed without complication. Patient tolerated the procedure well. Appliances were tried in and adjusted as needed.  [place] Trismus device was previously fabricated 45 mm with 26 sticks. Postop instructions were provided and a written and verbal format concerning the use and care of appliances. All questions were answered. Patient to return to clinic for periodic oral examination in approximately 2-4 weeks during radiation therapy. Patient to call if questions or problems arise before then.  ASSESSMENT: Post operative course is consistent with dental procedures performed in the OR. There does not appear to be any retained root tip in the area of #31.  PLAN: 1. Continue salt water rinses as needed to aid healing.  2. Brush teeth after meals and at bedtime. Floss at bedtime. 3. Use fluoride of fluoride trays at bedtime. Prescription for FluoroSHIELD was sent to [facility] today with PRN refills. 4. Trismus exercises as instructed. 5. Return to clinic in 2-3 weeks for oral exam examination during radiation therapy if so desired. Otherwise we will see her one month after the radiation therapy has been completed.   Lenn Cal, DDS

## 2014-09-29 NOTE — Patient Instructions (Signed)

## 2014-10-01 ENCOUNTER — Other Ambulatory Visit: Payer: Self-pay | Admitting: Radiology

## 2014-10-01 ENCOUNTER — Other Ambulatory Visit (HOSPITAL_COMMUNITY): Payer: Self-pay | Admitting: Hematology & Oncology

## 2014-10-01 ENCOUNTER — Encounter (HOSPITAL_COMMUNITY): Payer: 59 | Attending: Hematology & Oncology

## 2014-10-01 DIAGNOSIS — C140 Malignant neoplasm of pharynx, unspecified: Secondary | ICD-10-CM

## 2014-10-01 DIAGNOSIS — Z87891 Personal history of nicotine dependence: Secondary | ICD-10-CM | POA: Insufficient documentation

## 2014-10-01 DIAGNOSIS — C01 Malignant neoplasm of base of tongue: Secondary | ICD-10-CM | POA: Insufficient documentation

## 2014-10-01 MED ORDER — MAGIC MOUTHWASH W/LIDOCAINE: 5.0000 mL | Freq: Four times a day (QID) | ORAL | Status: DC

## 2014-10-01 MED ORDER — FENTANYL 12 MCG/HR TD PT72: 12.5000 ug | MEDICATED_PATCH | TRANSDERMAL | Status: DC

## 2014-10-01 NOTE — Progress Notes (Signed)
Chemo teaching done and consent signed for Cisplatin. Calendar given to patient. Premeds called in & pt has already picked up. Patient given RX for Duragesic 1mcg. Patient called in [place] to [facility]. Pt saw RT for trach care since patient is having bleeding with trach care. Pt instructed by RT to go back to ENT to evaluate the trach. Pt cleaning trach too often per RT and this is contributing to the swelling in her tracheal area.

## 2014-10-02 ENCOUNTER — Ambulatory Visit (HOSPITAL_COMMUNITY)
Admission: RE | Admit: 2014-10-02 | Discharge: 2014-10-02 | Disposition: A | Discharge status: Home / Self Care | Payer: 59 | Source: Ambulatory Visit | Attending: Interventional Radiology | Admitting: Interventional Radiology

## 2014-10-02 ENCOUNTER — Other Ambulatory Visit (HOSPITAL_COMMUNITY): Payer: Self-pay | Admitting: Hematology & Oncology

## 2014-10-02 ENCOUNTER — Ambulatory Visit (HOSPITAL_COMMUNITY)
Admission: RE | Admit: 2014-10-02 | Discharge: 2014-10-02 | Disposition: A | Discharge status: Home / Self Care | Payer: 59 | Source: Ambulatory Visit | Attending: Hematology & Oncology | Admitting: Hematology & Oncology

## 2014-10-02 ENCOUNTER — Encounter (HOSPITAL_COMMUNITY): Payer: Self-pay

## 2014-10-02 DIAGNOSIS — Z87891 Personal history of nicotine dependence: Secondary | ICD-10-CM | POA: Diagnosis not present

## 2014-10-02 DIAGNOSIS — F410 Panic disorder [episodic paroxysmal anxiety] without agoraphobia: Secondary | ICD-10-CM | POA: Diagnosis not present

## 2014-10-02 DIAGNOSIS — Z79899 Other long term (current) drug therapy: Secondary | ICD-10-CM | POA: Insufficient documentation

## 2014-10-02 DIAGNOSIS — M1990 Unspecified osteoarthritis, unspecified site: Secondary | ICD-10-CM | POA: Insufficient documentation

## 2014-10-02 DIAGNOSIS — L719 Rosacea, unspecified: Secondary | ICD-10-CM | POA: Diagnosis not present

## 2014-10-02 DIAGNOSIS — Z931 Gastrostomy status: Secondary | ICD-10-CM | POA: Insufficient documentation

## 2014-10-02 DIAGNOSIS — Z930 Tracheostomy status: Secondary | ICD-10-CM | POA: Insufficient documentation

## 2014-10-02 DIAGNOSIS — C140 Malignant neoplasm of pharynx, unspecified: Secondary | ICD-10-CM

## 2014-10-02 DIAGNOSIS — F419 Anxiety disorder, unspecified: Secondary | ICD-10-CM | POA: Diagnosis not present

## 2014-10-02 DIAGNOSIS — C01 Malignant neoplasm of base of tongue: Secondary | ICD-10-CM

## 2014-10-02 DIAGNOSIS — M797 Fibromyalgia: Secondary | ICD-10-CM | POA: Insufficient documentation

## 2014-10-02 DIAGNOSIS — G629 Polyneuropathy, unspecified: Secondary | ICD-10-CM | POA: Diagnosis not present

## 2014-10-02 DIAGNOSIS — E780 Pure hypercholesterolemia: Secondary | ICD-10-CM | POA: Diagnosis not present

## 2014-10-02 DIAGNOSIS — J449 Chronic obstructive pulmonary disease, unspecified: Secondary | ICD-10-CM | POA: Diagnosis not present

## 2014-10-02 DIAGNOSIS — J45909 Unspecified asthma, uncomplicated: Secondary | ICD-10-CM | POA: Insufficient documentation

## 2014-10-02 DIAGNOSIS — C7989 Secondary malignant neoplasm of other specified sites: Secondary | ICD-10-CM | POA: Diagnosis not present

## 2014-10-02 DIAGNOSIS — F329 Major depressive disorder, single episode, unspecified: Secondary | ICD-10-CM | POA: Insufficient documentation

## 2014-10-02 DIAGNOSIS — D649 Anemia, unspecified: Secondary | ICD-10-CM | POA: Diagnosis not present

## 2014-10-02 LAB — CBC WITH DIFFERENTIAL/PLATELET
BASOS ABS: 0 10*3/uL (ref 0.0–0.1)
Basophils Relative: 0 % (ref 0–1)
EOS ABS: 0.2 10*3/uL (ref 0.0–0.7)
Eosinophils Relative: 2 % (ref 0–5)
HEMATOCRIT: 36.2 % (ref 36.0–46.0)
Hemoglobin: 11.6 g/dL — ABNORMAL LOW (ref 12.0–15.0)
LYMPHS PCT: 14 % (ref 12–46)
Lymphs Abs: 1.5 10*3/uL (ref 0.7–4.0)
MCH: 28.8 pg (ref 26.0–34.0)
MCHC: 32 g/dL (ref 30.0–36.0)
MCV: 89.8 fL (ref 78.0–100.0)
Monocytes Absolute: 0.7 10*3/uL (ref 0.1–1.0)
Monocytes Relative: 6 % (ref 3–12)
NEUTROS ABS: 8.5 10*3/uL — AB (ref 1.7–7.7)
Neutrophils Relative %: 78 % — ABNORMAL HIGH (ref 43–77)
PLATELETS: 388 10*3/uL (ref 150–400)
RBC: 4.03 MIL/uL (ref 3.87–5.11)
RDW: 16.7 % — ABNORMAL HIGH (ref 11.5–15.5)
WBC: 10.9 10*3/uL — AB (ref 4.0–10.5)

## 2014-10-02 LAB — PROTIME-INR
INR: 0.96 (ref 0.00–1.49)
Prothrombin Time: 12.9 seconds (ref 11.6–15.2)

## 2014-10-02 MED ORDER — MIDAZOLAM HCL 2 MG/2ML IJ SOLN
INTRAMUSCULAR | Status: AC | PRN
  Administered 2014-10-02 (×2): 0.5 mg via INTRAVENOUS
  Administered 2014-10-02: 1 mg via INTRAVENOUS

## 2014-10-02 MED ORDER — SODIUM CHLORIDE 0.9 % IV SOLN
INTRAVENOUS | Status: DC
Start: 2014-10-02 — End: 2014-10-03
  Administered 2014-10-02: 13:00:00 via INTRAVENOUS

## 2014-10-02 MED ORDER — MIDAZOLAM HCL 2 MG/2ML IJ SOLN
INTRAMUSCULAR | Status: AC
  Filled 2014-10-02: qty 6

## 2014-10-02 MED ORDER — HEPARIN SOD (PORK) LOCK FLUSH 100 UNIT/ML IV SOLN
INTRAVENOUS | Status: AC | PRN
  Administered 2014-10-02: 500 [IU]

## 2014-10-02 MED ORDER — HEPARIN SOD (PORK) LOCK FLUSH 100 UNIT/ML IV SOLN
INTRAVENOUS | Status: AC
  Filled 2014-10-02: qty 5

## 2014-10-02 MED ORDER — VANCOMYCIN HCL IN DEXTROSE 1-5 GM/200ML-% IV SOLN
1000.0000 mg | Freq: Once | INTRAVENOUS | Status: AC
  Administered 2014-10-02: 1000 mg via INTRAVENOUS
  Filled 2014-10-02: qty 200

## 2014-10-02 MED ORDER — FENTANYL CITRATE 0.05 MG/ML IJ SOLN
INTRAMUSCULAR | Status: AC
  Filled 2014-10-02: qty 4

## 2014-10-02 MED ORDER — LIDOCAINE-EPINEPHRINE 2 %-1:100000 IJ SOLN
INTRAMUSCULAR | Status: AC
  Filled 2014-10-02: qty 1

## 2014-10-02 MED ORDER — FENTANYL CITRATE 0.05 MG/ML IJ SOLN
INTRAMUSCULAR | Status: AC | PRN
  Administered 2014-10-02: 25 ug via INTRAVENOUS
  Administered 2014-10-02: 50 ug via INTRAVENOUS

## 2014-10-02 NOTE — Procedures (Signed)
Successful placement of right IJ approach port-a-cath with tip at the superior caval atrial junction. The catheter is ready for immediate use. No immediate post procedural complications. 

## 2014-10-02 NOTE — Discharge Instructions (Signed)

## 2014-10-02 NOTE — H&P (Signed)
Chief Complaint: "I'm getting a port a cath"  Referring Physician(s): Penland,Shannon K  History of Present Illness: Shelby Larson is a 49 y.o. 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 She presents today for Port-A-Cath placement for chemotherapy.  Past Medical History  Diagnosis Date  . Heart murmur     as a child  . Chronic bronchitis   . Anxiety     panic attacks  . Depression   . Headache     onset a few months ago  . Neuropathy     had it in both hands  . Arthritis   . Fibromyalgia   . 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 Anemia   . Rosacea   . Hypercholesterolemia   . Asthma   . COPD (chronic obstructive pulmonary disease)   . Seizures     takes Gabapentin  (last one in 2010)    Past Surgical History  Procedure Laterality Date  . Wisdom tooth extraction    . Tonsillectomy Left 08/20/2014    Procedure: TONSILLECTOMY;  Surgeon: Ruby Cola, MD;  Location: [facility] [facility];  Service: ENT;  Laterality: Left;  . Radical neck dissection Left 08/20/2014    Procedure: RADICAL LEFT NECK DISSECTION ;  Surgeon: Ruby Cola, MD;  Location: [place];  Service: ENT;  Laterality: Left;  . Tracheostomy tube placement N/A 08/20/2014    Procedure: TRACHEOSTOMY - AWAKE;  Surgeon: Ruby Cola, MD;  Location: [place];  Service: ENT;  Laterality: N/A;  . Gastrostomy tube placement    . Multiple extractions with alveoloplasty N/A 09/17/2014    Procedure: Extraction of tooth #'s [phone] with alveoloplasty, mandibular left lingual torus reduction, and gross debridement of remaining teeth.;  Surgeon: Lenn Cal, DDS;  Location: [place];  Service: Oral Surgery;  Laterality: N/A;    Allergies: Bee venom; Penicillins; and Latex  Medications: Prior to Admission medications   Medication Sig Start Date End Date Taking? Authorizing Provider    Acetaminophen (CHLORASEPTIC SORE THROAT PO) Take 2 sprays by mouth daily as needed (sore throat).   Yes Historical Provider, MD  acetaminophen (TYLENOL) 160 MG/5ML solution Take 10.2 mLs (325 mg total) by mouth every 4 (four) hours as needed for mild pain, moderate pain or headache. 08/28/14  Yes Ruby Cola, MD  Alum & Mag Hydroxide-Simeth (MAGIC MOUTHWASH W/LIDOCAINE) SOLN Take 5 mLs by mouth 4 (four) times daily. 10/01/14  Yes Patrici Ranks, MD  bacitracin ointment Apply topically 3 (three) times daily. Apply to left neck incision TID x 3 weeks 08/28/14  Yes Ruby Cola, MD  CISPLATIN IV Inject into the vein every 21 ( twenty-one) days. To begin 10/08/14   Yes Historical Provider, MD  cyclobenzaprine (FLEXERIL) 10 MG tablet Take 10 mg by mouth 3 (three) times daily as needed for muscle spasms.    Yes Historical Provider, MD  fentaNYL (DURAGESIC) 12 MCG/HR Place 1 patch (12.5 mcg total) onto the skin every 3 (three) days. 10/01/14  Yes Patrici Ranks, MD  fluticasone (FLONASE) 50 MCG/ACT nasal spray Place 2 sprays into both nostrils daily.   Yes Historical Provider, MD  gabapentin (NEURONTIN) 600 MG tablet Take 600 mg by mouth 3 (three) times daily.   Yes Historical Provider, MD  lidocaine-prilocaine (EMLA) cream Apply a quarter size amount to port site 1 hour prior to chemo.  Do not rub in. Cover with plastic wrap. 09/14/14  Yes Patrici Ranks, MD  LORazepam (ATIVAN) 0.5 MG tablet Take 1-2 tablets 20 minutes prior to wearing radiation mask, PRN anxiety 09/02/14  Yes Eppie Gibson, MD  Naloxegol Oxalate (MOVANTIK) 25 MG TABS Take 1 tablet (25 mg total) by mouth every morning. 09/16/14  Yes Patrici Ranks, MD  ondansetron (ZOFRAN ODT) 8 MG disintegrating tablet Take 1 tablet (8 mg total) by mouth every 8 (eight) hours as needed for nausea or vomiting. 09/14/14  Yes Patrici Ranks, MD  oxyCODONE-acetaminophen (PERCOCET) 5-325 MG per tablet Take one or two tablets by mouth every 4-6 hours as needed  for pain. 09/17/14  Yes Lenn Cal, DDS  prochlorperazine (COMPAZINE) 10 MG tablet Take 1 tablet (10 mg total) by mouth every 6 (six) hours as needed for nausea or vomiting. 09/14/14  Yes Patrici Ranks, MD  QUEtiapine (SEROQUEL) 50 MG tablet Take 50 mg by mouth daily as needed (sleep).   Yes Historical Provider, MD  sertraline (ZOLOFT) 100 MG tablet Take 100 mg by mouth daily.   Yes Historical Provider, MD  sodium fluoride (FLUORISHIELD) 1.1 % GEL dental gel Instill one drop of gel per tooth space of fluoride tray. Place over teeth for 5 minutes. Remove. Spit out excess. Repeat nightly. 09/29/14  Yes Lenn Cal, DDS  tetrahydrozoline-zinc (VISINE-AC) 0.05-0.25 % ophthalmic solution Place 2 drops into both eyes daily as needed (dry eyes).   Yes Historical Provider, MD  valACYclovir (VALTREX) 500 MG tablet Take 1 tablet (500 mg total) by mouth 2 (two) times daily. 09/15/14  Yes Patrici Ranks, MD  afatinib dimaleate (GILOTRIF) 20 MG tablet Take 1 tablet (20 mg total) by mouth daily. Take on an empty stomach 1hr before or 2hrs after meals. Patient not taking: Reported on 09/16/2014 09/14/14   Patrici Ranks, MD  Amino Acids-Protein Hydrolys (FEEDING SUPPLEMENT, PRO-STAT SUGAR FREE 64,) LIQD Place 30 mLs into feeding tube daily. Patient not taking: Reported on 09/14/2014 08/28/14   Ruby Cola, MD  lidocaine (XYLOCAINE) 2 % solution Use as directed 20 mLs in the mouth or throat as needed for mouth pain. Patient not taking: Reported on 09/16/2014 08/08/14   Carmin Muskrat, MD  Nutritional Supplements (FEEDING SUPPLEMENT, JEVITY 1.2 CAL,) LIQD Place 480 mLs into feeding tube every 8 (eight) hours. Patient not taking: Reported on 10/02/2014 08/28/14   Ruby Cola, MD  sucralfate (CARAFATE) 1 G tablet Dissolve 1 tablet in 10 mL H20 and swallow 30 min prior to meals and bedtime. Patient not taking: Reported on 09/14/2014 09/02/14   Eppie Gibson, MD    Family History  Problem Relation Age of  Onset  . Neuropathy Mother   . Hypertension Mother   . Alcoholism Father   . Alcoholism Sister     History   Social History  . Marital Status: Single    Spouse Name: N/A  . Number of Children: N/A  . Years of Education: N/A   Social History Main Topics  . Smoking status: Former Smoker -- 0.50 packs/day for 20 years    Types: Cigarettes    Quit date: 06/18/2014  . Smokeless tobacco: Never Used  . Alcohol Use: Yes     Comment: occasional, 09/02/14 no longer using  . Drug Use: No  . Sexual Activity: Not on file   Other Topics Concern  . None   Social History Narrative      Review of Systems  Constitutional: Negative  for fever and chills.  Respiratory: Positive for cough. Negative for shortness of breath.        Occ hemoptysis  Cardiovascular: Negative for chest pain.  Gastrointestinal: Negative for nausea, vomiting, abdominal pain and blood in stool.  Genitourinary: Negative for dysuria and hematuria.  Musculoskeletal: Negative for back pain.  Neurological: Positive for headaches.    Vital Signs: BP 117/77 mmHg  Pulse 100  Temp(Src) 97.9 F (36.6 C) (Oral)  Resp 16  Ht 5\' 6"  (1.676 m)  Wt 121 lb (54.885 kg)  BMI 19.54 kg/m2  SpO2 100%  Physical Exam  Constitutional: She is oriented to person, place, and time.  Thin WF in NAD  Cardiovascular: Normal rate and regular rhythm.   Pulmonary/Chest: Effort normal.  Few bibasilar crackles; tracheostomy in place  Abdominal: Soft. Bowel sounds are normal.  Intact gastrostomy tube with clean insertion site; patient states she accidentally cut portion of outer tubing when removing dressing  Musculoskeletal: Normal range of motion. She exhibits no edema.  Lymphadenopathy:    She has cervical adenopathy.  Neurological: She is alert and oriented to person, place, and time.    Imaging: Nm Pet Image Initial (pi) Skull Base To Thigh  09/09/2014   ADDENDUM REPORT: 09/09/2014 17:01  ADDENDUM: The following is a correction  to the body of the report:  CHEST  Hypermetabolic left axillary lymph node measures 1.1 cm and has an SUV max equal to 6.27. No  hypermetabolic mediastinal or hilar lymph nodes identified. There is a sub solid nodule identified within the right upper lobe measuring 1.6 cm, image 17/series 6. The SUV max associated this nodule is equal to 2.1.   Electronically Signed   By: Kerby Moors M.D.   On: 09/09/2014 17:01   09/09/2014   CLINICAL DATA:  Initial treatment strategy for base of tongue cancer.  EXAM: NUCLEAR MEDICINE PET SKULL BASE TO THIGH  TECHNIQUE: 6.5 mCi F-18 FDG was injected intravenously. Full-ring PET imaging was performed from the skull base to thigh after the radiotracer. CT data was obtained and used for attenuation correction and anatomic localization.  FASTING BLOOD GLUCOSE:  Value: 79 mg/dl  COMPARISON:  Neck CT 08/08/2014.  FINDINGS: NECK  Large circumferential oropharyngeal mass is again identified this measures approximately 3.9 x 3.5 x 3.2 cm and has an SUV max equal to 18.98. Hypermetabolic bilateral cervical lymph nodes are identified; index right level-II lymph node measures 1.3 cm and has an SUV max equal to 9.2. Left supraclavicular lymph node measures 1.2 cm and has an SUV max equal to 14.7. Small right supraclavicular lymph node measures less than 1 cm and exhibits mild FDG uptake within SUV max equal to 2.4.Tracheostomy tube is identified.  CHEST  Hypermetabolic left axillary lymph node measures 6.3 cm. No hypermetabolic mediastinal or hilar lymph nodes identified. There is a sub solid nodule identified within the right upper lobe measuring 1.6 cm, image 17/series 6. The SUV max associated this nodules occult a 2.1.  ABDOMEN/PELVIS  Gastrostomy tube noted. No abnormal hypermetabolic activity within the liver, pancreas, adrenal glands, or spleen. No hypermetabolic lymph nodes in the abdomen or pelvis.  SKELETON  No focal hypermetabolic activity to suggest skeletal metastasis.   IMPRESSION: 1. Intensely hypermetabolic circumferential oropharyngeal mass is identified. 2. Hypermetabolic bilateral cervical metastatic adenopathy as well as hypermetabolic left axillary adenopathy. 3. Sub solid nodule identified within the right upper lobe is nonspecific but exhibits malignant range FDG uptake. Attention of this nodule on follow-up examination is recommended.  4. Status post tracheostomy and gastrostomy tube placement  Electronically Signed: By: Kerby Moors M.D. On: 09/09/2014 16:06    Labs:  CBC:  Recent Labs  09/02/14 1007 09/14/14 1600 09/18/14 1200 10/02/14 1245  WBC 11.3* 11.2* 12.4* 10.9*  HGB 11.1* 11.0* 10.4* 11.6*  HCT 34.8 33.9* 32.3* 36.2  PLT 396 487* 292 388    COAGS:  Recent Labs  08/20/14 1446 09/18/14 1200 10/02/14 1245  INR 1.11 1.13 0.96  APTT 28 33  --     BMP:  Recent Labs  08/20/14 0728 08/20/14 1446 08/21/14 0242 09/02/14 1007 09/14/14 1600  NA 135 137 135 137 134*  K 3.1* 4.3 4.0 4.5 4.2  CL 98 101 96  --  99  CO2 25 19 26 27 25   GLUCOSE 90 72 108* 87 76  BUN 10 9 5* 10.7 17  CALCIUM 9.5 8.5 7.9* 10.1 9.9  CREATININE 0.86 0.84 0.84 0.8 0.74  GFRNONAA 79* 81* 81*  --  >90  GFRAA >90 >90 >90  --  >90    LIVER FUNCTION TESTS:  Recent Labs  08/20/14 1446 08/21/14 0242 09/14/14 1600  BILITOT 1.8* 1.1 0.4  AST 31 27 20   ALT 14 12 10   ALKPHOS 76 76 72  PROT 6.3 6.4 8.1  ALBUMIN 3.4* 3.3* 3.7    TUMOR MARKERS: No results for input(s): AFPTM, CEA, CA199, CHROMGRNA in the last 8760 hours.  Assessment and Plan: Shelby Larson is a 49 y.o. 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 She presents today for Port-A-Cath placement for chemotherapy.Risks and benefits discussed with the patient /family including, but not limited to bleeding, infection, pneumothorax, or fibrin sheath development and need for additional  procedures. All of the patient's questions were answered, patient is agreeable to proceed. Consent signed and in chart.       Signed: Autumn Messing 10/02/2014, 1:28 PM   I spent a total of 20 minutes face to face in clinical consultation, greater than 50% of which was counseling/coordinating care for Port-A-Cath placement.

## 2014-10-04 ENCOUNTER — Encounter (HOSPITAL_COMMUNITY): Payer: Self-pay | Admitting: Hematology & Oncology

## 2014-10-07 ENCOUNTER — Encounter: Payer: Self-pay | Admitting: *Deleted

## 2014-10-07 NOTE — Progress Notes (Signed)
[place] Psychosocial Distress Screening Clinical Social Work  Clinical Social Work was referred by distress screening protocol.  The patient scored a 10 on the Psychosocial Distress Thermometer which indicates severe distress. Clinical Social Worker phoned pt to assess for distress and other psychosocial needs. Pt does not have any appts scheduled on day when CSW is in house. CSW talked with pt at length. CSW explained role of CSW, discussed Support Programs and resources to assist. Pt reports she is considering applying for ss disability and was told she may be approved. CSW encouraged pt to apply for other assistance as well. CSW reviewed [facility], Duanne Limerick and others. Pt shared she is currently anxious and depressed. CSW processed emotions with pt and CSW provided supportive listening and encouragement. Pt has concerns about co-pay assistance and CSW to make referral to Financial Advocate. CSW educated pt how to use [facility] and pt reports to have used in the past. Pt very interested in Support Group and plans to also attend group at [employer]. Pt very open to support and CSW following closely.    ONCBCN DISTRESS SCREENING 10/01/2014  Screening Type Initial Screening  Distress experienced in past week (1-10) 10  Practical problem type Insurance;Transportation;Food  Emotional problem type Nervousness/Anxiety;Depression  Information Concerns Type Lack of info about diagnosis  Physical Problem type Pain;Sleep/insomnia;Bathing/dressing;Breathing;Mouth sores/swallowing;Talking;Skin dry/itchy  Physician notified of physical symptoms Yes  Referral to clinical social work Yes  Referral to financial advocate Yes  Other     Clinical Social Worker follow up needed: Yes.    If yes, follow up plan:  See above Loren Racer, [place] Tuesdays 8:30-1pm Wednesdays 8:30-12pm  [phone]

## 2014-10-07 NOTE — Progress Notes (Signed)
[name], MD [place] [address]  Malignant neoplasm of base of tongue - Plan: CBC  Malignant neoplasm of pharynx - Plan: fentaNYL (DURAGESIC) 12 MCG/HR  CURRENT THERAPY: Cisplatin + XRT  INTERVAL HISTORY: Shelby Larson 49 y.o. 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  I personally reviewed and went over laboratory results with the patient.  The results are noted within this dictation.  Her Hgb has noted to drop by 3 grams in 1 week.  I suspect it is from coughing up blood from tumor.  She does not meet transfusion requirements today, but I will recheck a CBC on Monday.  She was very anxious for her first treatment and was starting to experience anticipatory nausea in the clinic.  She was given 0.5 mg of IV Ativan for this as she typically take this medication PO at home.  I suspected she could tolerate that dose.  Today on exam she is asleep.  She is easily aroused however she does not participate in conversation today.    The friend reports that Dr. Simeon Craft was upset about the timing of treatment, but many things needed to occur before treatment such as port placement, tooth extraction and recovery, etc in addition to a lost week due to patient's beach vacation.  Now, from a radiation standpoint, there is a delay due to a QA issue.    The patient's sister is a new Therapist, sports and sent [place] a fax regarding a Cetuximab containing regimen and she requested we consider this treatment.  This treatment is considered, but is not typically used front line for a fit individual like Cisplatin.  Additionally, her malignancy was HPV negative.  I answered all of the questions her friend who accompanied her today had.  I provided a refill on her pain  medication.     Past Medical History  Diagnosis Date  . Heart murmur     as a child  . Chronic bronchitis   . Anxiety     panic attacks  . Depression   . Headache     onset a few months ago  . Neuropathy     had it in both hands  . Arthritis   . Fibromyalgia   . 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 Anemia   . Rosacea   . Hypercholesterolemia   . Asthma   . COPD (chronic obstructive pulmonary disease)   . Seizures     takes Gabapentin  (last one in 2010)    has Malignant neoplasm of pharynx; Malignant neoplasm of base of tongue; Recurrent cold sores; Therapeutic opioid induced constipation; and Chronic periodontitis on her problem list.     is allergic to bee venom; penicillins; and latex.  Shelby Larson had no medications administered during this visit.  Past Surgical History  Procedure Laterality Date  . Wisdom tooth extraction    . Tonsillectomy Left 08/20/2014    Procedure: TONSILLECTOMY;  Surgeon: Ruby Cola, MD;  Location: [facility];  Service: ENT;  Laterality: Left;  . Radical neck dissection Left 08/20/2014    Procedure: RADICAL LEFT NECK DISSECTION ;  Surgeon: Ruby Cola, MD;  Location: [facility];  Service: ENT;  Laterality:  Left;  . Tracheostomy tube placement N/A 08/20/2014    Procedure: TRACHEOSTOMY - AWAKE;  Surgeon: Ruby Cola, MD;  Location: ;  Service: ENT;  Laterality: N/A;  . Gastrostomy tube placement    . Multiple extractions with alveoloplasty N/A 09/17/2014    Procedure: Extraction of tooth #'s [id number] with alveoloplasty, mandibular left lingual torus reduction, and gross debridement of remaining teeth.;  Surgeon: Lenn Cal, DDS;  Location: [facility];  Service: Oral Surgery;  Laterality: N/A;    Denies any headaches, dizziness, double vision, fevers, chills, night sweats, nausea, vomiting, diarrhea, constipation, chest pain, heart palpitations, shortness of breath, blood in stool, black tarry  stool, urinary pain, urinary burning, urinary frequency, hematuria.   PHYSICAL EXAMINATION  ECOG PERFORMANCE STATUS: 1 - Symptomatic but completely ambulatory  Filed Vitals:   10/08/14 1000  BP: 117/84  Pulse: 98  Temp: 98.5 F (36.9 C)  Resp: 16    GENERAL:no distress, comfortable, cooperative and asleep secondary to IV ativan, in chemotherapy recliner, accompanied by best friend. SKIN: skin color, texture, turgor are normal, no rashes or significant lesions HEAD: Normocephalic, No masses, lesions, tenderness or abnormalities EYES: normal, PERRLA, EOMI, Conjunctiva are pink and non-injected EARS: External ears normal OROPHARYNX:lips, buccal mucosa, and tongue normal and mucous membranes are moist  NECK: supple, tracheostomy noted, left sided dissection site noted. LYMPH:  not examined BREAST:not examined LUNGS: clear to auscultation  HEART: regular rate & rhythm ABDOMEN:abdomen soft and non-tender BACK: Back symmetric, no curvature. EXTREMITIES:less then 2 second capillary refill, no skin discoloration, no cyanosis  NEURO: no focal motor/sensory deficits    LABORATORY DATA: CBC    Component Value Date/Time   WBC 10.5 10/08/2014 1017   WBC 11.3* 09/02/2014 1007   RBC 3.00* 10/08/2014 1017   RBC 3.76 09/02/2014 1007   HGB 8.7* 10/08/2014 1017   HGB 11.1* 09/02/2014 1007   HCT 27.2* 10/08/2014 1017   HCT 34.8 09/02/2014 1007   PLT 301 10/08/2014 1017   PLT 396 09/02/2014 1007   MCV 90.7 10/08/2014 1017   MCV 92.6 09/02/2014 1007   MCH 29.0 10/08/2014 1017   MCH 29.5 09/02/2014 1007   MCHC 32.0 10/08/2014 1017   MCHC 31.9 09/02/2014 1007   RDW 16.9* 10/08/2014 1017   RDW 15.4* 09/02/2014 1007   LYMPHSABS 1.1 10/08/2014 1017   LYMPHSABS 1.5 09/02/2014 1007   MONOABS 0.8 10/08/2014 1017   MONOABS 1.1* 09/02/2014 1007   EOSABS 0.3 10/08/2014 1017   EOSABS 0.2 09/02/2014 1007   BASOSABS 0.0 10/08/2014 1017   BASOSABS 0.0 09/02/2014 1007      Chemistry        Component Value Date/Time   NA 138 10/08/2014 1017   NA 137 09/02/2014 1007   K 3.4* 10/08/2014 1017   K 4.5 09/02/2014 1007   CL 103 10/08/2014 1017   CO2 26 10/08/2014 1017   CO2 27 09/02/2014 1007   BUN 14 10/08/2014 1017   BUN 10.7 09/02/2014 1007   CREATININE 0.78 10/08/2014 1017   CREATININE 0.8 09/02/2014 1007      Component Value Date/Time   CALCIUM 9.4 10/08/2014 1017   CALCIUM 10.1 09/02/2014 1007   ALKPHOS 98 10/08/2014 1017   AST 20 10/08/2014 1017   ALT 10 10/08/2014 1017   BILITOT 0.4 10/08/2014 1017      RADIOGRAPHIC STUDIES:  Nm Pet Image Initial (pi) Skull Base To Thigh  09/09/2014   ADDENDUM REPORT: 09/09/2014 17:01  ADDENDUM: The following is a correction  to the body of the report:  CHEST  Hypermetabolic left axillary lymph node measures 1.1 cm and has an SUV max equal to 6.27. No  hypermetabolic mediastinal or hilar lymph nodes identified. There is a sub solid nodule identified within the right upper lobe measuring 1.6 cm, image 17/series 6. The SUV max associated this nodule is equal to 2.1.   Electronically Signed   By: Kerby Moors M.D.   On: 09/09/2014 17:01   09/09/2014   CLINICAL DATA:  Initial treatment strategy for base of tongue cancer.  EXAM: NUCLEAR MEDICINE PET SKULL BASE TO THIGH  TECHNIQUE: 6.5 mCi F-18 FDG was injected intravenously. Full-ring PET imaging was performed from the skull base to thigh after the radiotracer. CT data was obtained and used for attenuation correction and anatomic localization.  FASTING BLOOD GLUCOSE:  Value: 79 mg/dl  COMPARISON:  Neck CT 08/08/2014.  FINDINGS: NECK  Large circumferential oropharyngeal mass is again identified this measures approximately 3.9 x 3.5 x 3.2 cm and has an SUV max equal to 18.98. Hypermetabolic bilateral cervical lymph nodes are identified; index right level-II lymph node measures 1.3 cm and has an SUV max equal to 9.2. Left supraclavicular lymph node measures 1.2 cm and has an SUV max equal to  14.7. Small right supraclavicular lymph node measures less than 1 cm and exhibits mild FDG uptake within SUV max equal to 2.4.Tracheostomy tube is identified.  CHEST  Hypermetabolic left axillary lymph node measures 6.3 cm. No hypermetabolic mediastinal or hilar lymph nodes identified. There is a sub solid nodule identified within the right upper lobe measuring 1.6 cm, image 17/series 6. The SUV max associated this nodules occult a 2.1.  ABDOMEN/PELVIS  Gastrostomy tube noted. No abnormal hypermetabolic activity within the liver, pancreas, adrenal glands, or spleen. No hypermetabolic lymph nodes in the abdomen or pelvis.  SKELETON  No focal hypermetabolic activity to suggest skeletal metastasis.  IMPRESSION: 1. Intensely hypermetabolic circumferential oropharyngeal mass is identified. 2. Hypermetabolic bilateral cervical metastatic adenopathy as well as hypermetabolic left axillary adenopathy. 3. Sub solid nodule identified within the right upper lobe is nonspecific but exhibits malignant range FDG uptake. Attention of this nodule on follow-up examination is recommended. 4. Status post tracheostomy and gastrostomy tube placement  Electronically Signed: By: Kerby Moors M.D. On: 09/09/2014 16:06   Ir Fluoro Guide Cv Line Right  10/02/2014   INDICATION: History of tongue cancer. In need of durable intravenous access for chemotherapy administration.  EXAM: IMPLANTED PORT A CATH PLACEMENT WITH ULTRASOUND AND FLUOROSCOPIC GUIDANCE  COMPARISON:  PET-CT - 09/09/2014  MEDICATIONS: Vancomycin 1 gm IV; The antibiotic was administered within an appropriate time interval prior to skin puncture.  ANESTHESIA/SEDATION: Versed 2 mg IV; Fentanyl 75 mcg IV;  Total Moderate Sedation Time  27 minutes.  CONTRAST:  None  FLUOROSCOPY TIME:  12 seconds (0.3 mGy)  COMPLICATIONS: None immediate  PROCEDURE: The procedure, risks, benefits, and alternatives were explained to the patient. Questions regarding the procedure were encouraged and  answered. The patient understands and consents to the procedure.  The right neck and chest were prepped with chlorhexidine in a sterile fashion, and a sterile drape was applied covering the operative field. Maximum barrier sterile technique with sterile gowns and gloves were used for the procedure. A timeout was performed prior to the initiation of the procedure. Local anesthesia was provided with 1% lidocaine with epinephrine.  After creating a small venotomy incision, a micropuncture kit was utilized to access the internal jugular vein under  direct, real-time ultrasound guidance. Ultrasound image documentation was performed. The microwire was kinked to measure appropriate catheter length.  A subcutaneous port pocket was then created along the upper chest wall utilizing a combination of sharp and blunt dissection. The pocket was irrigated with sterile saline. A single lumen ISP power injectable port was chosen for placement. The 8 Fr catheter was tunneled from the port pocket site to the venotomy incision. The port was placed in the pocket. The external catheter was trimmed to appropriate length. At the venotomy, an 8 Fr peel-away sheath was placed over a guidewire under fluoroscopic guidance. The catheter was then placed through the sheath and the sheath was removed. Final catheter positioning was confirmed and documented with a fluoroscopic spot radiograph. The port was accessed with a Huber needle, aspirated and flushed with heparinized saline.  The venotomy site was closed with an interrupted 4-0 Vicryl suture. The port pocket incision was closed with interrupted 2-0 Vicryl suture and the skin was opposed with a running subcuticular 4-0 Vicryl suture. Dermabond and Steri-strips were applied to both incisions. Dressings were placed. The patient tolerated the procedure well without immediate post procedural complication.  FINDINGS: After catheter placement, the tip lies within the superior cavoatrial junction.  The catheter aspirates and flushes normally and is ready for immediate use.  IMPRESSION: Successful placement of a right internal jugular approach power injectable Port-A-Cath. The catheter is ready for immediate use.   Electronically Signed   By: Sandi Mariscal M.D.   On: 10/02/2014 15:48   Ir US Guide Vasc Access Right  10/02/2014   INDICATION: History of tongue cancer. In need of durable intravenous access for chemotherapy administration.  EXAM: IMPLANTED PORT A CATH PLACEMENT WITH ULTRASOUND AND FLUOROSCOPIC GUIDANCE  COMPARISON:  PET-CT - 09/09/2014  MEDICATIONS: Vancomycin 1 gm IV; The antibiotic was administered within an appropriate time interval prior to skin puncture.  ANESTHESIA/SEDATION: Versed 2 mg IV; Fentanyl 75 mcg IV;  Total Moderate Sedation Time  27 minutes.  CONTRAST:  None  FLUOROSCOPY TIME:  12 seconds (0.3 mGy)  COMPLICATIONS: None immediate  PROCEDURE: The procedure, risks, benefits, and alternatives were explained to the patient. Questions regarding the procedure were encouraged and answered. The patient understands and consents to the procedure.  The right neck and chest were prepped with chlorhexidine in a sterile fashion, and a sterile drape was applied covering the operative field. Maximum barrier sterile technique with sterile gowns and gloves were used for the procedure. A timeout was performed prior to the initiation of the procedure. Local anesthesia was provided with 1% lidocaine with epinephrine.  After creating a small venotomy incision, a micropuncture kit was utilized to access the internal jugular vein under direct, real-time ultrasound guidance. Ultrasound image documentation was performed. The microwire was kinked to measure appropriate catheter length.  A subcutaneous port pocket was then created along the upper chest wall utilizing a combination of sharp and blunt dissection. The pocket was irrigated with sterile saline. A single lumen ISP power injectable port was chosen  for placement. The 8 Fr catheter was tunneled from the port pocket site to the venotomy incision. The port was placed in the pocket. The external catheter was trimmed to appropriate length. At the venotomy, an 8 Fr peel-away sheath was placed over a guidewire under fluoroscopic guidance. The catheter was then placed through the sheath and the sheath was removed. Final catheter positioning was confirmed and documented with a fluoroscopic spot radiograph. The port was accessed with a Charisse March  needle, aspirated and flushed with heparinized saline.  The venotomy site was closed with an interrupted 4-0 Vicryl suture. The port pocket incision was closed with interrupted 2-0 Vicryl suture and the skin was opposed with a running subcuticular 4-0 Vicryl suture. Dermabond and Steri-strips were applied to both incisions. Dressings were placed. The patient tolerated the procedure well without immediate post procedural complication.  FINDINGS: After catheter placement, the tip lies within the superior cavoatrial junction. The catheter aspirates and flushes normally and is ready for immediate use.  IMPRESSION: Successful placement of a right internal jugular approach power injectable Port-A-Cath. The catheter is ready for immediate use.   Electronically Signed   By: Sandi Mariscal M.D.   On: 10/02/2014 15:48      ASSESSMENT AND PLAN:  Malignant neoplasm of base of tongue Stage IVA Squamous Cell Carcinoma, HPV-, moderately differentiated, S/P left neck dissection with sparing of 11th cranial nerve, sternocleidomastoid muscle, internal jugular vein, biopsy , tracheotomy on 07/31/2014.  Beginning concomitant chemoradiation with Cisplatin every 21 days.  Cisplatin Day 1 is today.  Start of radiation is pending due to QA issues.   CBC on Monday to evaluate Hgb and consider transfusion if indicated.  All questions answered and patient's friend informed about chemotherapy selection for Ms. Schwan in addition to the role of  chemotherapy and radiation therapy in her cancer care.  Refill of Fentanyl provided today.  IV Ativan 0.5 mg given today for anxiety and anticipatory nausea with excellent response.  Return in 1 week for follow-up.   THERAPY PLAN:  Concomitant chemoradiation with Cisplatin every 21 days  All questions were answered. The patient knows to call the clinic with any problems, questions or concerns. We can certainly see the patient much sooner if necessary.  Patient and plan discussed with Dr. Ancil Linsey and she is in agreement with the aforementioned.   This note is electronically signed by: Robynn Pane 10/08/2014 11:40 AM

## 2014-10-07 NOTE — Assessment & Plan Note (Addendum)
Stage IVA Squamous Cell Carcinoma, HPV-, moderately differentiated, S/P left neck dissection with sparing of 11th cranial nerve, sternocleidomastoid muscle, internal jugular vein, biopsy , tracheotomy on 07/31/2014.  Beginning concomitant chemoradiation with Cisplatin every 21 days.  Cisplatin Day 1 is today.  Start of radiation is pending due to QA issues.   CBC on Monday to evaluate Hgb and consider transfusion if indicated.  All questions answered and patient's friend informed about chemotherapy selection for Ms. Delio in addition to the role of chemotherapy and radiation therapy in her cancer care.  Refill of Fentanyl provided today.  IV Ativan 0.5 mg given today for anxiety and anticipatory nausea with excellent response.  Return in 1 week for follow-up.

## 2014-10-08 ENCOUNTER — Ambulatory Visit (HOSPITAL_COMMUNITY): Payer: Self-pay | Admitting: Hematology & Oncology

## 2014-10-08 ENCOUNTER — Encounter (HOSPITAL_COMMUNITY): Payer: Self-pay | Admitting: Oncology

## 2014-10-08 ENCOUNTER — Other Ambulatory Visit (HOSPITAL_COMMUNITY): Payer: Self-pay | Admitting: Hematology & Oncology

## 2014-10-08 ENCOUNTER — Inpatient Hospital Stay (HOSPITAL_COMMUNITY): Payer: Self-pay

## 2014-10-08 ENCOUNTER — Encounter (HOSPITAL_BASED_OUTPATIENT_CLINIC_OR_DEPARTMENT_OTHER): Payer: 59

## 2014-10-08 ENCOUNTER — Encounter (HOSPITAL_COMMUNITY): Payer: 59 | Attending: Oncology | Admitting: Oncology

## 2014-10-08 VITALS — BP 117/84 | HR 98 | Temp 98.5°F | Resp 16 | Wt 123.6 lb

## 2014-10-08 VITALS — BP 119/77 | HR 91 | Temp 98.3°F | Resp 18

## 2014-10-08 DIAGNOSIS — Z5111 Encounter for antineoplastic chemotherapy: Secondary | ICD-10-CM

## 2014-10-08 DIAGNOSIS — D630 Anemia in neoplastic disease: Secondary | ICD-10-CM | POA: Diagnosis not present

## 2014-10-08 DIAGNOSIS — F419 Anxiety disorder, unspecified: Secondary | ICD-10-CM

## 2014-10-08 DIAGNOSIS — C140 Malignant neoplasm of pharynx, unspecified: Secondary | ICD-10-CM

## 2014-10-08 DIAGNOSIS — C01 Malignant neoplasm of base of tongue: Secondary | ICD-10-CM | POA: Diagnosis present

## 2014-10-08 DIAGNOSIS — R110 Nausea: Secondary | ICD-10-CM

## 2014-10-08 DIAGNOSIS — Z87891 Personal history of nicotine dependence: Secondary | ICD-10-CM | POA: Diagnosis not present

## 2014-10-08 LAB — CBC WITH DIFFERENTIAL/PLATELET
Basophils Absolute: 0 10*3/uL (ref 0.0–0.1)
Basophils Relative: 0 % (ref 0–1)
Eosinophils Absolute: 0.3 10*3/uL (ref 0.0–0.7)
Eosinophils Relative: 3 % (ref 0–5)
HEMATOCRIT: 27.2 % — AB (ref 36.0–46.0)
HEMOGLOBIN: 8.7 g/dL — AB (ref 12.0–15.0)
LYMPHS ABS: 1.1 10*3/uL (ref 0.7–4.0)
Lymphocytes Relative: 10 % — ABNORMAL LOW (ref 12–46)
MCH: 29 pg (ref 26.0–34.0)
MCHC: 32 g/dL (ref 30.0–36.0)
MCV: 90.7 fL (ref 78.0–100.0)
MONO ABS: 0.8 10*3/uL (ref 0.1–1.0)
Monocytes Relative: 8 % (ref 3–12)
Neutro Abs: 8.3 10*3/uL — ABNORMAL HIGH (ref 1.7–7.7)
Neutrophils Relative %: 79 % — ABNORMAL HIGH (ref 43–77)
Platelets: 301 10*3/uL (ref 150–400)
RBC: 3 MIL/uL — ABNORMAL LOW (ref 3.87–5.11)
RDW: 16.9 % — ABNORMAL HIGH (ref 11.5–15.5)
WBC: 10.5 10*3/uL (ref 4.0–10.5)

## 2014-10-08 LAB — COMPREHENSIVE METABOLIC PANEL
ALT: 10 U/L (ref 0–35)
AST: 20 U/L (ref 0–37)
Albumin: 3.4 g/dL — ABNORMAL LOW (ref 3.5–5.2)
Alkaline Phosphatase: 98 U/L (ref 39–117)
Anion gap: 9 (ref 5–15)
BUN: 14 mg/dL (ref 6–23)
CO2: 26 mmol/L (ref 19–32)
CREATININE: 0.78 mg/dL (ref 0.50–1.10)
Calcium: 9.4 mg/dL (ref 8.4–10.5)
Chloride: 103 mmol/L (ref 96–112)
GFR calc Af Amer: >90 mL/min (ref >90)
GFR calc non Af Amer: >90 mL/min (ref >90)
Glucose, Bld: 110 mg/dL — ABNORMAL HIGH (ref 70–99)
Potassium: 3.4 mmol/L — ABNORMAL LOW (ref 3.5–5.1)
Sodium: 138 mmol/L (ref 135–145)
Total Bilirubin: 0.4 mg/dL (ref 0.3–1.2)
Total Protein: 7.2 g/dL (ref 6.0–8.3)

## 2014-10-08 LAB — PREPARE RBC (CROSSMATCH)

## 2014-10-08 LAB — MAGNESIUM: MAGNESIUM: 1.7 mg/dL (ref 1.5–2.5)

## 2014-10-08 LAB — ABO/RH: ABO/RH(D): A POS

## 2014-10-08 MED ORDER — SODIUM CHLORIDE 0.9 % IV SOLN
INTRAVENOUS | Status: DC
  Administered 2014-10-08: 10:00:00 via INTRAVENOUS

## 2014-10-08 MED ORDER — PALONOSETRON HCL INJECTION 0.25 MG/5ML
0.2500 mg | Freq: Once | INTRAVENOUS | Status: AC
  Administered 2014-10-08: 0.25 mg via INTRAVENOUS
  Filled 2014-10-08: qty 5

## 2014-10-08 MED ORDER — SODIUM CHLORIDE 0.9 % IV SOLN
Freq: Once | INTRAVENOUS | Status: AC
  Administered 2014-10-08: 10:00:00 via INTRAVENOUS
  Filled 2014-10-08: qty 5

## 2014-10-08 MED ORDER — LORAZEPAM 2 MG/ML IJ SOLN
0.5000 mg | Freq: Once | INTRAMUSCULAR | Status: AC
  Administered 2014-10-08: 0.5 mg via INTRAVENOUS

## 2014-10-08 MED ORDER — SODIUM CHLORIDE 0.9 % IV SOLN
100.0000 mg/m2 | Freq: Once | INTRAVENOUS | Status: AC
  Administered 2014-10-08: 160 mg via INTRAVENOUS
  Filled 2014-10-08: qty 160

## 2014-10-08 MED ORDER — SODIUM CHLORIDE 0.9 % IJ SOLN
10.0000 mL | INTRAMUSCULAR | Status: DC | PRN
  Administered 2014-10-08: 10 mL
  Filled 2014-10-08: qty 10

## 2014-10-08 MED ORDER — FENTANYL 12 MCG/HR TD PT72: 12.5000 ug | MEDICATED_PATCH | TRANSDERMAL | Status: DC

## 2014-10-08 MED ORDER — LORAZEPAM 2 MG/ML IJ SOLN
INTRAMUSCULAR | Status: AC
  Filled 2014-10-08: qty 1

## 2014-10-08 MED ORDER — FENTANYL 25 MCG/HR TD PT72: 25.0000 ug | MEDICATED_PATCH | TRANSDERMAL | Status: DC

## 2014-10-08 MED ORDER — POTASSIUM CHLORIDE 2 MEQ/ML IV SOLN
Freq: Once | INTRAVENOUS | Status: AC
  Administered 2014-10-08: 11:00:00 via INTRAVENOUS
  Filled 2014-10-08: qty 10

## 2014-10-08 MED ORDER — HEPARIN SOD (PORK) LOCK FLUSH 100 UNIT/ML IV SOLN
500.0000 [IU] | Freq: Once | INTRAVENOUS | Status: AC | PRN
Start: 2014-10-08 — End: 2014-10-08
  Administered 2014-10-08: 500 [IU]
  Filled 2014-10-08: qty 5

## 2014-10-08 NOTE — Patient Instructions (Signed)
[facility] Discharge Instructions for Patients Receiving Chemotherapy  Today you received the following chemotherapy agents:  cisplatin  If you develop nausea and vomiting, or diarrhea that is not controlled by your medication, call the clinic.  The clinic phone number is [phone]. Office hours are Monday-Friday 8:30am-5:00pm.  BELOW ARE SYMPTOMS THAT SHOULD BE REPORTED IMMEDIATELY:  *FEVER GREATER THAN 101.0 F  *CHILLS WITH OR WITHOUT FEVER  NAUSEA AND VOMITING THAT IS NOT CONTROLLED WITH YOUR NAUSEA MEDICATION  *UNUSUAL SHORTNESS OF BREATH  *UNUSUAL BRUISING OR BLEEDING  TENDERNESS IN MOUTH AND THROAT WITH OR WITHOUT PRESENCE OF ULCERS  *URINARY PROBLEMS  *BOWEL PROBLEMS  UNUSUAL RASH Items with * indicate a potential emergency and should be followed up as soon as possible. If you have an emergency after office hours please contact your primary care physician or go to the nearest emergency department.  Please call the clinic during office hours if you have any questions or concerns.   You may also contact the Patient Navigator at [phone] should you have any questions or need assistance in obtaining follow up care. _____________________________________________________________________ Have you asked about our STAR program?    STAR stands for Survivorship Training and Rehabilitation, and this is a nationally recognized cancer care program that focuses on survivorship and rehabilitation.  Cancer and cancer treatments may cause problems, such as, pain, making you feel tired and keeping you from doing the things that you need or want to do. Cancer rehabilitation can help. Our goal is to reduce these troubling effects and help you have the best quality of life possible.  You may receive a survey from a nurse that asks questions about your current state of health.  Based on the survey results, all eligible patients will be referred to the [facility] program for  an evaluation so we can better serve you! A frequently asked questions sheet is available upon request.          Cisplatin injection What is this medicine? CISPLATIN (SIS pla tin) is a chemotherapy drug. It targets fast dividing cells, like cancer cells, and causes these cells to die. This medicine is used to treat many types of cancer like bladder, ovarian, and testicular cancers. This medicine may be used for other purposes; ask your health care provider or pharmacist if you have questions. COMMON BRAND NAME(S): Platinol, Platinol -AQ What should I tell my health care provider before I take this medicine? They need to know if you have any of these conditions: -blood disorders -hearing problems -kidney disease -recent or ongoing radiation therapy -an unusual or allergic reaction to cisplatin, carboplatin, other chemotherapy, other medicines, foods, dyes, or preservatives -pregnant or trying to get pregnant -breast-feeding How should I use this medicine? This drug is given as an infusion into a vein. It is administered in a hospital or clinic by a specially trained health care professional. Talk to your pediatrician regarding the use of this medicine in children. Special care may be needed. Overdosage: If you think you have taken too much of this medicine contact a poison control center or emergency room at once. NOTE: This medicine is only for you. Do not share this medicine with others. What if I miss a dose? It is important not to miss a dose. Call your doctor or health care professional if you are unable to keep an appointment. What may interact with this medicine? -dofetilide -foscarnet -medicines for seizures -medicines to increase blood counts like filgrastim, pegfilgrastim, sargramostim -probenecid -  pyridoxine used with altretamine -rituximab -some antibiotics like amikacin, gentamicin, neomycin, polymyxin B, streptomycin,  tobramycin -sulfinpyrazone -vaccines -zalcitabine Talk to your doctor or health care professional before taking any of these medicines: -acetaminophen -aspirin -ibuprofen -ketoprofen -naproxen This list may not describe all possible interactions. Give your health care provider a list of all the medicines, herbs, non-prescription drugs, or dietary supplements you use. Also tell them if you smoke, drink alcohol, or use illegal drugs. Some items may interact with your medicine. What should I watch for while using this medicine? Your condition will be monitored carefully while you are receiving this medicine. You will need important blood work done while you are taking this medicine. This drug may make you feel generally unwell. This is not uncommon, as chemotherapy can affect healthy cells as well as cancer cells. Report any side effects. Continue your course of treatment even though you feel ill unless your doctor tells you to stop. In some cases, you may be given additional medicines to help with side effects. Follow all directions for their use. Call your doctor or health care professional for advice if you get a fever, chills or sore throat, or other symptoms of a cold or flu. Do not treat yourself. This drug decreases your body's ability to fight infections. Try to avoid being around people who are sick. This medicine may increase your risk to bruise or bleed. Call your doctor or health care professional if you notice any unusual bleeding. Be careful brushing and flossing your teeth or using a toothpick because you may get an infection or bleed more easily. If you have any dental work done, tell your dentist you are receiving this medicine. Avoid taking products that contain aspirin, acetaminophen, ibuprofen, naproxen, or ketoprofen unless instructed by your doctor. These medicines may hide a fever. Do not become pregnant while taking this medicine. Women should inform their doctor if they wish  to become pregnant or think they might be pregnant. There is a potential for serious side effects to an unborn child. Talk to your health care professional or pharmacist for more information. Do not breast-feed an infant while taking this medicine. Drink fluids as directed while you are taking this medicine. This will help protect your kidneys. Call your doctor or health care professional if you get diarrhea. Do not treat yourself. What side effects may I notice from receiving this medicine? Side effects that you should report to your doctor or health care professional as soon as possible: -allergic reactions like skin rash, itching or hives, swelling of the face, lips, or tongue -signs of infection - fever or chills, cough, sore throat, pain or difficulty passing urine -signs of decreased platelets or bleeding - bruising, pinpoint red spots on the skin, black, tarry stools, nosebleeds -signs of decreased red blood cells - unusually weak or tired, fainting spells, lightheadedness -breathing problems -changes in hearing -gout pain -low blood counts - This drug may decrease the number of white blood cells, red blood cells and platelets. You may be at increased risk for infections and bleeding. -nausea and vomiting -pain, swelling, redness or irritation at the injection site -pain, tingling, numbness in the hands or feet -problems with balance, movement -trouble passing urine or change in the amount of urine Side effects that usually do not require medical attention (report to your doctor or health care professional if they continue or are bothersome): -changes in vision -loss of appetite -metallic taste in the mouth or changes in taste This list may  not describe all possible side effects. Call your doctor for medical advice about side effects. You may report side effects to FDA at 1-800-FDA-1088. Where should I keep my medicine? This drug is given in a hospital or clinic and will not be stored  at home. NOTE: This sheet is a summary. It may not cover all possible information. If you have questions about this medicine, talk to your doctor, pharmacist, or health care provider.  2015, [employer]. (2007-09-17 14:40:54)

## 2014-10-08 NOTE — Addendum Note (Signed)
Addended by: Baird Cancer on: 10/08/2014 03:51 PM   Modules accepted: Orders, Medications

## 2014-10-08 NOTE — Progress Notes (Signed)
1515:  Type and cross match labs obtained; placed armband and instructed pt to not remove until after transfusion tomorrow. Pt tolerated treatment today w/o adverse reaction.  Will return in AM for transfusion 2 units PRBC.

## 2014-10-08 NOTE — Patient Instructions (Signed)
..  [place] at [facility] Discharge Instructions  RECOMMENDATIONS MADE BY THE CONSULTANT AND ANY TEST RESULTS WILL BE SENT TO YOUR REFERRING PHYSICIAN. Your hemoglobin has dropped to 8.7  Lab work on Monday At [facility] If you are low and require a transfusion you will need to come here for additional labs.  Return to [place] as scheduled  Thank you for choosing [place] at [facility] to provide your oncology and hematology care.  To afford each patient quality time with our provider, please arrive at least 15 minutes before your scheduled appointment time.    You need to re-schedule your appointment should you arrive 10 or more minutes late.  We strive to give you quality time with our providers, and arriving late affects you and other patients whose appointments are after yours.  Also, if you no show three or more times for appointments you may be dismissed from the clinic at the providers discretion.     Again, thank you for choosing [facility].  Our hope is that these requests will decrease the amount of time that you wait before being seen by our physicians.       _____________________________________________________________  Should you have questions after your visit to [facility], please contact our office at [phone] between the hours of 8:30 a.m. and 4:30 p.m.  Voicemails left after 4:30 p.m. will not be returned until the following business day.  For prescription refill requests, have your pharmacy contact our office.

## 2014-10-09 ENCOUNTER — Ambulatory Visit (HOSPITAL_COMMUNITY): Payer: Self-pay | Admitting: Oncology

## 2014-10-09 ENCOUNTER — Encounter (HOSPITAL_BASED_OUTPATIENT_CLINIC_OR_DEPARTMENT_OTHER): Payer: 59

## 2014-10-09 ENCOUNTER — Encounter (HOSPITAL_COMMUNITY): Payer: Self-pay

## 2014-10-09 DIAGNOSIS — C01 Malignant neoplasm of base of tongue: Secondary | ICD-10-CM | POA: Diagnosis not present

## 2014-10-09 DIAGNOSIS — D630 Anemia in neoplastic disease: Secondary | ICD-10-CM | POA: Diagnosis not present

## 2014-10-09 LAB — TYPE AND SCREEN
ABO/RH(D): A POS
ANTIBODY SCREEN: NEGATIVE
UNIT DIVISION: 0
Unit division: 0

## 2014-10-09 LAB — PREPARE RBC (CROSSMATCH)

## 2014-10-09 MED ORDER — DIPHENHYDRAMINE HCL 50 MG/ML IJ SOLN
25.0000 mg | Freq: Once | INTRAMUSCULAR | Status: AC
  Administered 2014-10-09: 25 mg via INTRAVENOUS
  Filled 2014-10-09: qty 1

## 2014-10-09 MED ORDER — HEPARIN SOD (PORK) LOCK FLUSH 100 UNIT/ML IV SOLN
INTRAVENOUS | Status: AC
  Filled 2014-10-09: qty 5

## 2014-10-09 MED ORDER — SODIUM CHLORIDE 0.9 % IV SOLN
250.0000 mL | Freq: Once | INTRAVENOUS | Status: AC
  Administered 2014-10-09: 250 mL via INTRAVENOUS

## 2014-10-09 MED ORDER — SODIUM CHLORIDE 0.9 % IJ SOLN
10.0000 mL | INTRAMUSCULAR | Status: AC | PRN
  Administered 2014-10-09: 10 mL

## 2014-10-09 MED ORDER — HEPARIN SOD (PORK) LOCK FLUSH 100 UNIT/ML IV SOLN
500.0000 [IU] | Freq: Every day | INTRAVENOUS | Status: AC | PRN
  Administered 2014-10-09: 500 [IU]

## 2014-10-09 MED ORDER — ACETAMINOPHEN 325 MG PO TABS
650.0000 mg | ORAL_TABLET | Freq: Once | ORAL | Status: AC
  Administered 2014-10-09: 650 mg via ORAL
  Filled 2014-10-09: qty 2

## 2014-10-09 NOTE — Progress Notes (Signed)
0835:  Denies n/v/d, constipation, fatigue, or any other symptoms on 24 hour follow-up after receiving first dose of cisplatin on 10/08/14.

## 2014-10-09 NOTE — Patient Instructions (Signed)
[place] at [facility] Discharge Instructions  RECOMMENDATIONS MADE BY THE CONSULTANT AND ANY TEST RESULTS WILL BE SENT TO YOUR REFERRING PHYSICIAN.  Today you received one unit of blood.  Return on Monday as scheduled to receive the second unit of blood.   Thank you for choosing [place] at [facility] to provide your oncology and hematology care.  To afford each patient quality time with our provider, please arrive at least 15 minutes before your scheduled appointment time.    You need to re-schedule your appointment should you arrive 10 or more minutes late.  We strive to give you quality time with our providers, and arriving late affects you and other patients whose appointments are after yours.  Also, if you no show three or more times for appointments you may be dismissed from the clinic at the providers discretion.     Again, thank you for choosing [facility].  Our hope is that these requests will decrease the amount of time that you wait before being seen by our physicians.       _____________________________________________________________  Should you have questions after your visit to [facility], please contact our office at [phone] between the hours of 8:30 a.m. and 4:30 p.m.  Voicemails left after 4:30 p.m. will not be returned until the following business day.  For prescription refill requests, have your pharmacy contact our office.

## 2014-10-11 ENCOUNTER — Telehealth: Payer: Self-pay | Admitting: Oncology

## 2014-10-11 NOTE — Telephone Encounter (Signed)
[facility] ED physician called [place] yesterday reporting the Shelby Larson presented to the ED with SOB.  Discussion between Onc and ED took place regarding the patient with Onc recommendations.   She reports "I think I know the problem."  She had the feeling of SOB because she could not breathe through her mouth.  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  She should be able to breath without difficulty through her Trach.  She has anxiolytic medications at home and she is encouraged to use them as prescribed to help with her sensation of SOB.  We will see her in the clinic as scheduled.  KEFALAS,THOMAS 10/11/2014 12:14 PM

## 2014-10-12 ENCOUNTER — Encounter (HOSPITAL_BASED_OUTPATIENT_CLINIC_OR_DEPARTMENT_OTHER): Payer: 59

## 2014-10-12 ENCOUNTER — Other Ambulatory Visit (HOSPITAL_COMMUNITY): Payer: Self-pay

## 2014-10-12 DIAGNOSIS — C01 Malignant neoplasm of base of tongue: Secondary | ICD-10-CM | POA: Diagnosis not present

## 2014-10-12 DIAGNOSIS — D630 Anemia in neoplastic disease: Secondary | ICD-10-CM | POA: Diagnosis not present

## 2014-10-12 LAB — CBC
HCT: 28.1 % — ABNORMAL LOW (ref 36.0–46.0)
Hemoglobin: 9.3 g/dL — ABNORMAL LOW (ref 12.0–15.0)
MCH: 29.2 pg (ref 26.0–34.0)
MCHC: 33.1 g/dL (ref 30.0–36.0)
MCV: 88.1 fL (ref 78.0–100.0)
Platelets: 254 10*3/uL (ref 150–400)
RBC: 3.19 MIL/uL — ABNORMAL LOW (ref 3.87–5.11)
RDW: 16.4 % — AB (ref 11.5–15.5)
WBC: 7.3 10*3/uL (ref 4.0–10.5)

## 2014-10-12 MED ORDER — HEPARIN SOD (PORK) LOCK FLUSH 100 UNIT/ML IV SOLN
INTRAVENOUS | Status: AC
  Filled 2014-10-12: qty 5

## 2014-10-12 MED ORDER — DIPHENHYDRAMINE HCL 25 MG PO CAPS
25.0000 mg | ORAL_CAPSULE | Freq: Once | ORAL | Status: AC
  Administered 2014-10-12: 25 mg via ORAL
  Filled 2014-10-12: qty 1

## 2014-10-12 MED ORDER — SODIUM CHLORIDE 0.9 % IJ SOLN
10.0000 mL | INTRAMUSCULAR | Status: AC | PRN
  Administered 2014-10-12: 10 mL

## 2014-10-12 MED ORDER — SODIUM CHLORIDE 0.9 % IV SOLN
250.0000 mL | Freq: Once | INTRAVENOUS | Status: AC
  Administered 2014-10-12: 250 mL via INTRAVENOUS

## 2014-10-12 MED ORDER — HEPARIN SOD (PORK) LOCK FLUSH 100 UNIT/ML IV SOLN
500.0000 [IU] | Freq: Every day | INTRAVENOUS | Status: AC | PRN
  Administered 2014-10-12: 500 [IU]

## 2014-10-12 MED ORDER — ACETAMINOPHEN 325 MG PO TABS
650.0000 mg | ORAL_TABLET | Freq: Once | ORAL | Status: AC
  Administered 2014-10-12: 650 mg via ORAL
  Filled 2014-10-12: qty 2

## 2014-10-12 NOTE — Progress Notes (Signed)
Tolerated blood transfusion well. 

## 2014-10-12 NOTE — Patient Instructions (Signed)
[place] at [facility] Discharge Instructions  RECOMMENDATIONS MADE BY THE CONSULTANT AND ANY TEST RESULTS WILL BE SENT TO YOUR REFERRING PHYSICIAN.  Today you received 1 unit red blood cell transfusion. Return as scheduled.  Thank you for choosing [place] at [facility] to provide your oncology and hematology care.  To afford each patient quality time with our provider, please arrive at least 15 minutes before your scheduled appointment time.    You need to re-schedule your appointment should you arrive 10 or more minutes late.  We strive to give you quality time with our providers, and arriving late affects you and other patients whose appointments are after yours.  Also, if you no show three or more times for appointments you may be dismissed from the clinic at the providers discretion.     Again, thank you for choosing [facility].  Our hope is that these requests will decrease the amount of time that you wait before being seen by our physicians.       _____________________________________________________________  Should you have questions after your visit to [facility], please contact our office at [phone] between the hours of 8:30 a.m. and 4:30 p.m.  Voicemails left after 4:30 p.m. will not be returned until the following business day.  For prescription refill requests, have your pharmacy contact our office.

## 2014-10-13 ENCOUNTER — Other Ambulatory Visit (HOSPITAL_COMMUNITY): Payer: Self-pay | Admitting: *Deleted

## 2014-10-13 ENCOUNTER — Encounter (HOSPITAL_COMMUNITY): Payer: Self-pay | Admitting: *Deleted

## 2014-10-13 DIAGNOSIS — R635 Abnormal weight gain: Secondary | ICD-10-CM

## 2014-10-13 DIAGNOSIS — C140 Malignant neoplasm of pharynx, unspecified: Secondary | ICD-10-CM

## 2014-10-13 DIAGNOSIS — C01 Malignant neoplasm of base of tongue: Secondary | ICD-10-CM

## 2014-10-13 LAB — TYPE AND SCREEN
ABO/RH(D): A POS
Antibody Screen: NEGATIVE
UNIT DIVISION: 0
Unit division: 0

## 2014-10-13 MED ORDER — LORAZEPAM 0.5 MG PO TABS: ORAL_TABLET | ORAL | Status: DC

## 2014-10-14 ENCOUNTER — Encounter (HOSPITAL_COMMUNITY): Payer: Self-pay | Admitting: Oncology

## 2014-10-14 ENCOUNTER — Encounter (HOSPITAL_COMMUNITY): Payer: 59

## 2014-10-14 ENCOUNTER — Encounter (HOSPITAL_BASED_OUTPATIENT_CLINIC_OR_DEPARTMENT_OTHER): Payer: 59 | Admitting: Oncology

## 2014-10-14 ENCOUNTER — Encounter: Payer: Self-pay | Admitting: *Deleted

## 2014-10-14 VITALS — BP 136/96 | HR 92 | Temp 98.7°F | Resp 20 | Wt 118.3 lb

## 2014-10-14 DIAGNOSIS — C01 Malignant neoplasm of base of tongue: Secondary | ICD-10-CM

## 2014-10-14 DIAGNOSIS — R634 Abnormal weight loss: Secondary | ICD-10-CM | POA: Diagnosis not present

## 2014-10-14 LAB — CBC
HCT: 32.8 % — ABNORMAL LOW (ref 36.0–46.0)
Hemoglobin: 11.2 g/dL — ABNORMAL LOW (ref 12.0–15.0)
MCH: 29.2 pg (ref 26.0–34.0)
MCHC: 34.1 g/dL (ref 30.0–36.0)
MCV: 85.4 fL (ref 78.0–100.0)
PLATELETS: 274 10*3/uL (ref 150–400)
RBC: 3.84 MIL/uL — ABNORMAL LOW (ref 3.87–5.11)
RDW: 15.6 % — AB (ref 11.5–15.5)
WBC: 7.8 10*3/uL (ref 4.0–10.5)

## 2014-10-14 MED ORDER — HEPARIN SOD (PORK) LOCK FLUSH 100 UNIT/ML IV SOLN
INTRAVENOUS | Status: AC
  Filled 2014-10-14: qty 5

## 2014-10-14 MED ORDER — SODIUM CHLORIDE 0.9 % IJ SOLN
10.0000 mL | INTRAMUSCULAR | Status: DC | PRN
  Administered 2014-10-14: 10 mL via INTRAVENOUS
  Filled 2014-10-14: qty 10

## 2014-10-14 MED ORDER — HEPARIN SOD (PORK) LOCK FLUSH 100 UNIT/ML IV SOLN
500.0000 [IU] | Freq: Once | INTRAVENOUS | Status: AC
  Administered 2014-10-14: 500 [IU] via INTRAVENOUS

## 2014-10-14 NOTE — Progress Notes (Signed)
[name], MD [place] [address]  Malignant neoplasm of base of tongue - Plan: ALPRAZolam (XANAX) 0.5 MG tablet, ipratropium-albuterol (DUONEB) 0.5-2.5 (3) MG/3ML SOLN, CBC, Amb Referral to Nutrition and Diabetic E, heparin lock flush 100 unit/mL, sodium chloride 0.9 % injection 10 mL, CBC  CURRENT THERAPY: Cisplatin + XRT  INTERVAL HISTORY: Shelby Larson 49 y.o. 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    Malignant neoplasm of base of tongue   08/20/2014 Surgery Diagnosis 1. Tongue, biopsy, left base/ pharynx mass - INVASIVE MODERATELY DIFFERENTIATED SQUAMOUS CELL CARCINOMA. - SEE COMMENT. 2. Lymph nodes, radical neck dissection, Left - THREE OF SIXTEEN LYMPH NODES POSITIVE FOR METASTATIC SQUAMOUS CELL [facility]   09/18/2014 Surgery Multiple extraction of tooth numbers 2, 14, 15, 18, and 31.  4 Quadrants of alveoloplasty.  Gross debridement of remaining dentition. Mandibular left lingual torus reduction   10/08/2014 -  Chemotherapy Cisplatin every 21 days   10/09/2014 -  Radiation Therapy     I personally reviewed and went over laboratory results with the patient.  The results are noted within this dictation.  See telephone encounter regarding ED visit.  She notes continued shortness of breath but is educated that the sensation of SOB does not match reality as it is a sensation and not truly an airway obstruction.  Her tracheostomy bypasses the obstruction.  She understands that and notes the sensation of SOB.    She admits to continued blood loss from malignancy.  At times, she wakes during sleep and clears her tracheostomy of blood.  She notes some minimal nausea that is well controlled with ODT Zofran.  She does not like the PO  compazine and she reports that she does not think it is effective.  I recommended she use the Ativan SL as needed when Zofran is not effective.  She is thankful for the information.  "How long until I notice an improvement."  I told her that I hope she notices a big improvement in symptoms over the next few weeks and possibly days.  She is looking forward to feeling better.   I note a 5 lb weight loss x 1 week.  I have consulted nutritionist for assistance with intake requirements/needs.  Oncologically, she otherwise denies any complaints and ROS questioning is negative.    Past Medical History  Diagnosis Date  . Heart murmur     as a child  . Chronic bronchitis   . Anxiety     panic attacks  . Depression   . Headache     onset a few months ago  . Neuropathy     had it in both hands  . Arthritis   . Fibromyalgia   . 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 Anemia   . Rosacea   . Hypercholesterolemia   . Asthma   . COPD (chronic obstructive pulmonary disease)   . Seizures     takes Gabapentin  (last one in 2010)    has Malignant neoplasm of pharynx; Malignant neoplasm of base of tongue; Recurrent cold sores; Therapeutic opioid induced constipation; and Chronic periodontitis on her problem list.     is allergic to bee venom; penicillins; and  latex.  We administered heparin lock flush and sodium chloride.  Past Surgical History  Procedure Laterality Date  . Wisdom tooth extraction    . Tonsillectomy Left 08/20/2014    Procedure: TONSILLECTOMY;  Surgeon: Mitchell Gore, MD;  Location: [facility] OR;  Service: ENT;  Laterality: Left;  . Radical neck dissection Left 08/20/2014    Procedure: RADICAL LEFT NECK DISSECTION ;  Surgeon: Mitchell Gore, MD;  Location: [facility] OR;  Service: ENT;  Laterality: Left;  . Tracheostomy tube placement N/A 08/20/2014    Procedure: TRACHEOSTOMY - AWAKE;  Surgeon: Mitchell Gore, MD;  Location: [facility] OR;  Service: ENT;   Laterality: N/A;  . Gastrostomy tube placement    . Multiple extractions with alveoloplasty N/A 09/17/2014    Procedure: Extraction of tooth #'s 2,14,15,18,31 with alveoloplasty, mandibular left lingual torus reduction, and gross debridement of remaining teeth.;  Surgeon: Ronald F Kulinski, DDS;  Location: [facility] OR;  Service: Oral Surgery;  Laterality: N/A;    Denies any dizziness, double vision, fevers, chills, night sweats, constipation, chest pain, heart palpitations, blood in stool, black tarry stool, urinary pain, urinary burning, urinary frequency, hematuria.   PHYSICAL EXAMINATION  ECOG PERFORMANCE STATUS: 1 - Symptomatic but completely ambulatory  Filed Vitals:   10/14/14 1216  BP: 136/96  Pulse: 92  Temp: 98.7 F (37.1 C)  Resp: 20    GENERAL:alert, no distress, well nourished, well developed, comfortable, cooperative, smiling and tracheostomy in place. SKIN: skin color, texture, turgor are normal, no rashes or significant lesions HEAD: Normocephalic, No masses, lesions, tenderness or abnormalities EYES: normal, PERRLA, EOMI, Conjunctiva are pink and non-injected EARS: External ears normal OROPHARYNX:lips, buccal mucosa, and tongue normal and mucous membranes are moist  NECK: supple, trachea midline, tracheostomy noted LYMPH:  not examined BREAST:not examined LUNGS: clear to auscultation  HEART: regular rate & rhythm, no murmurs and no gallops ABDOMEN:abdomen soft and normal bowel sounds BACK: Back symmetric, no curvature. EXTREMITIES:less then 2 second capillary refill, no joint deformities, effusion, or inflammation, no skin discoloration, no cyanosis  NEURO: alert & oriented x 3 with fluent speech, no focal motor/sensory deficits, gait normal   LABORATORY DATA: CBC    Component Value Date/Time   WBC 7.8 10/14/2014 1300   WBC 11.3* 09/02/2014 1007   RBC 3.84* 10/14/2014 1300   RBC 3.76 09/02/2014 1007   HGB 11.2* 10/14/2014 1300   HGB 11.1* 09/02/2014 1007   HCT  32.8* 10/14/2014 1300   HCT 34.8 09/02/2014 1007   PLT 274 10/14/2014 1300   PLT 396 09/02/2014 1007   MCV 85.4 10/14/2014 1300   MCV 92.6 09/02/2014 1007   MCH 29.2 10/14/2014 1300   MCH 29.5 09/02/2014 1007   MCHC 34.1 10/14/2014 1300   MCHC 31.9 09/02/2014 1007   RDW 15.6* 10/14/2014 1300   RDW 15.4* 09/02/2014 1007   LYMPHSABS 1.1 10/08/2014 1017   LYMPHSABS 1.5 09/02/2014 1007   MONOABS 0.8 10/08/2014 1017   MONOABS 1.1* 09/02/2014 1007   EOSABS 0.3 10/08/2014 1017   EOSABS 0.2 09/02/2014 1007   BASOSABS 0.0 10/08/2014 1017   BASOSABS 0.0 09/02/2014 1007      Chemistry      Component Value Date/Time   NA 138 10/08/2014 1017   NA 137 09/02/2014 1007   K 3.4* 10/08/2014 1017   K 4.5 09/02/2014 1007   CL 103 10/08/2014 1017   CO2 26 10/08/2014 1017   CO2 27 09/02/2014 1007   BUN 14 10/08/2014 1017   BUN 10.7 09/02/2014 1007     CREATININE 0.78 10/08/2014 1017   CREATININE 0.8 09/02/2014 1007      Component Value Date/Time   CALCIUM 9.4 10/08/2014 1017   CALCIUM 10.1 09/02/2014 1007   ALKPHOS 98 10/08/2014 1017   AST 20 10/08/2014 1017   ALT 10 10/08/2014 1017   BILITOT 0.4 10/08/2014 1017       RADIOGRAPHIC STUDIES:  Ir Fluoro Guide Cv Line Right  10/02/2014   INDICATION: History of tongue cancer. In need of durable intravenous access for chemotherapy administration.  EXAM: IMPLANTED PORT A CATH PLACEMENT WITH ULTRASOUND AND FLUOROSCOPIC GUIDANCE  COMPARISON:  PET-CT - 09/09/2014  MEDICATIONS: Vancomycin 1 gm IV; The antibiotic was administered within an appropriate time interval prior to skin puncture.  ANESTHESIA/SEDATION: Versed 2 mg IV; Fentanyl 75 mcg IV;  Total Moderate Sedation Time  27 minutes.  CONTRAST:  None  FLUOROSCOPY TIME:  12 seconds (0.3 mGy)  COMPLICATIONS: None immediate  PROCEDURE: The procedure, risks, benefits, and alternatives were explained to the patient. Questions regarding the procedure were encouraged and answered. The patient understands  and consents to the procedure.  The right neck and chest were prepped with chlorhexidine in a sterile fashion, and a sterile drape was applied covering the operative field. Maximum barrier sterile technique with sterile gowns and gloves were used for the procedure. A timeout was performed prior to the initiation of the procedure. Local anesthesia was provided with 1% lidocaine with epinephrine.  After creating a small venotomy incision, a micropuncture kit was utilized to access the internal jugular vein under direct, real-time ultrasound guidance. Ultrasound image documentation was performed. The microwire was kinked to measure appropriate catheter length.  A subcutaneous port pocket was then created along the upper chest wall utilizing a combination of sharp and blunt dissection. The pocket was irrigated with sterile saline. A single lumen ISP power injectable port was chosen for placement. The 8 Fr catheter was tunneled from the port pocket site to the venotomy incision. The port was placed in the pocket. The external catheter was trimmed to appropriate length. At the venotomy, an 8 Fr peel-away sheath was placed over a guidewire under fluoroscopic guidance. The catheter was then placed through the sheath and the sheath was removed. Final catheter positioning was confirmed and documented with a fluoroscopic spot radiograph. The port was accessed with a Huber needle, aspirated and flushed with heparinized saline.  The venotomy site was closed with an interrupted 4-0 Vicryl suture. The port pocket incision was closed with interrupted 2-0 Vicryl suture and the skin was opposed with a running subcuticular 4-0 Vicryl suture. Dermabond and Steri-strips were applied to both incisions. Dressings were placed. The patient tolerated the procedure well without immediate post procedural complication.  FINDINGS: After catheter placement, the tip lies within the superior cavoatrial junction. The catheter aspirates and flushes  normally and is ready for immediate use.  IMPRESSION: Successful placement of a right internal jugular approach power injectable Port-A-Cath. The catheter is ready for immediate use.   Electronically Signed   By: Sandi Mariscal M.D.   On: 10/02/2014 15:48   Ir US Guide Vasc Access Right  10/02/2014   INDICATION: History of tongue cancer. In need of durable intravenous access for chemotherapy administration.  EXAM: IMPLANTED PORT A CATH PLACEMENT WITH ULTRASOUND AND FLUOROSCOPIC GUIDANCE  COMPARISON:  PET-CT - 09/09/2014  MEDICATIONS: Vancomycin 1 gm IV; The antibiotic was administered within an appropriate time interval prior to skin puncture.  ANESTHESIA/SEDATION: Versed 2 mg IV; Fentanyl 75 mcg IV;  Total Moderate Sedation Time  27 minutes.  CONTRAST:  None  FLUOROSCOPY TIME:  12 seconds (0.3 mGy)  COMPLICATIONS: None immediate  PROCEDURE: The procedure, risks, benefits, and alternatives were explained to the patient. Questions regarding the procedure were encouraged and answered. The patient understands and consents to the procedure.  The right neck and chest were prepped with chlorhexidine in a sterile fashion, and a sterile drape was applied covering the operative field. Maximum barrier sterile technique with sterile gowns and gloves were used for the procedure. A timeout was performed prior to the initiation of the procedure. Local anesthesia was provided with 1% lidocaine with epinephrine.  After creating a small venotomy incision, a micropuncture kit was utilized to access the internal jugular vein under direct, real-time ultrasound guidance. Ultrasound image documentation was performed. The microwire was kinked to measure appropriate catheter length.  A subcutaneous port pocket was then created along the upper chest wall utilizing a combination of sharp and blunt dissection. The pocket was irrigated with sterile saline. A single lumen ISP power injectable port was chosen for placement. The 8 Fr catheter was  tunneled from the port pocket site to the venotomy incision. The port was placed in the pocket. The external catheter was trimmed to appropriate length. At the venotomy, an 8 Fr peel-away sheath was placed over a guidewire under fluoroscopic guidance. The catheter was then placed through the sheath and the sheath was removed. Final catheter positioning was confirmed and documented with a fluoroscopic spot radiograph. The port was accessed with a Huber needle, aspirated and flushed with heparinized saline.  The venotomy site was closed with an interrupted 4-0 Vicryl suture. The port pocket incision was closed with interrupted 2-0 Vicryl suture and the skin was opposed with a running subcuticular 4-0 Vicryl suture. Dermabond and Steri-strips were applied to both incisions. Dressings were placed. The patient tolerated the procedure well without immediate post procedural complication.  FINDINGS: After catheter placement, the tip lies within the superior cavoatrial junction. The catheter aspirates and flushes normally and is ready for immediate use.  IMPRESSION: Successful placement of a right internal jugular approach power injectable Port-A-Cath. The catheter is ready for immediate use.   Electronically Signed   By: John  Watts M.D.   On: 10/02/2014 15:48      ASSESSMENT AND PLAN:  Malignant neoplasm of base of tongue Stage IVA Squamous Cell Carcinoma, HPV-, moderately differentiated, S/P left neck dissection with sparing of 11th cranial nerve, sternocleidomastoid muscle, internal jugular vein, biopsy , tracheotomy on 07/31/2014.  Beginning concomitant chemoradiation with Cisplatin every 21 days.  Cisplatin Day 1 was 10/08/2014.  Started radiation on 10/09/2014.  I have addressed her anti-emetic regimen and recommended she use ODT Zofran and Ativan SL when needed.   Labs today: CBC  I have consulted nutritionist as well.  Return in 1 week for follow-up.     THERAPY PLAN:  Continue with radiation as  planned.  She is due for her next cycle in 2 weeks.  There is discussion about adding 5 FU to her treatment regimen and I will defer that decision to Dr. Penland.   All questions were answered. The patient knows to call the clinic with any problems, questions or concerns. We can certainly see the patient much sooner if necessary.  Patient and plan discussed with Dr. Shannon Penland and she is in agreement with the aforementioned.   This note is electronically signed by: , 10/14/2014 3:37 PM   

## 2014-10-14 NOTE — Progress Notes (Signed)
Shelby Larson presented for Portacath access and flush.  Portacath located in the right chest wall accessed with  H 20 needle. Clean, Dry and Intact Good blood return present. Portacath flushed with 3ml NS and 500U/80ml Heparin per protocol and needle removed intact. Procedure without incident. Patient tolerated procedure well.

## 2014-10-14 NOTE — Patient Instructions (Signed)
..  [place] at [facility] Discharge Instructions  RECOMMENDATIONS MADE BY THE CONSULTANT AND ANY TEST RESULTS WILL BE SENT TO YOUR REFERRING PHYSICIAN.  Return in 1 week Nutritional consult  Thank you for choosing [place] at [facility] to provide your oncology and hematology care.  To afford each patient quality time with our provider, please arrive at least 15 minutes before your scheduled appointment time.    You need to re-schedule your appointment should you arrive 10 or more minutes late.  We strive to give you quality time with our providers, and arriving late affects you and other patients whose appointments are after yours.  Also, if you no show three or more times for appointments you may be dismissed from the clinic at the providers discretion.     Again, thank you for choosing [facility]  Our hope is that these requests will decrease the amount of time that you wait before being seen by our physicians.       _____________________________________________________________  Should you have questions after your visit to [facility], please contact our office at [phone] between the hours of 8:30 a.m. and 4:30 p.m.  Voicemails left after 4:30 p.m. will not be returned until the following business day.  For prescription refill requests, have your pharmacy contact our office.

## 2014-10-14 NOTE — Assessment & Plan Note (Addendum)
Stage IVA Squamous Cell Carcinoma, HPV-, moderately differentiated, S/P left neck dissection with sparing of 11th cranial nerve, sternocleidomastoid muscle, internal jugular vein, biopsy , tracheotomy on 07/31/2014.  Beginning concomitant chemoradiation with Cisplatin every 21 days.  Cisplatin Day 1 was 10/08/2014.  Started radiation on 10/09/2014.  I have addressed her anti-emetic regimen and recommended she use ODT Zofran and Ativan SL when needed.   Labs today: CBC  I have consulted nutritionist as well.  Return in 1 week for follow-up.

## 2014-10-14 NOTE — Progress Notes (Signed)
[place] Clinical Social Work  Holiday representative met with patient at [facility] to offer support and assess for needs.  CSW assisted pt with completion of [facility] [employer] [place] cancer gas card application. CSW has talked with pt over the phone several times and was glad to meet pt in person. CSW to follow and assist.  Application to be submitted to [facility] [place].   Clinical Social Work interventions: Resource assistance  Loren Racer, [place] Tuesdays 8:30-1pm Wednesdays 8:30-12pm  [phone]

## 2014-10-15 ENCOUNTER — Ambulatory Visit (HOSPITAL_COMMUNITY): Payer: Self-pay | Admitting: Hematology & Oncology

## 2014-10-15 ENCOUNTER — Other Ambulatory Visit (HOSPITAL_COMMUNITY): Payer: Self-pay

## 2014-10-16 ENCOUNTER — Encounter: Payer: Self-pay | Admitting: Dietician

## 2014-10-16 ENCOUNTER — Other Ambulatory Visit (HOSPITAL_COMMUNITY): Payer: Self-pay

## 2014-10-16 NOTE — Progress Notes (Signed)
Was consulted to assess needs of patient  Contacted Pt by phone.    Wt Readings from Last 10 Encounters:  10/14/14 118 lb 4.8 oz (53.661 kg)  10/08/14 123 lb 9.6 oz (56.065 kg)  09/17/14 121 lb (54.885 kg)  09/14/14 122 lb 9.6 oz (55.611 kg)  09/02/14 122 lb 8 oz (55.566 kg)  08/28/14 136 lb 11 oz (62 kg)  08/08/14 121 lb (54.885 kg)  Patient weight has decreased by 5 pounds in the last week.  Patient reports oral intake as fair, but her swallow function is worsening.   Pt has just started concomitant chemoradiation with Cisplatin every 21 days. Cisplatin Day 1 was 10/08/2014. Started radiation on 10/09/2014.  Pt states she used to be able to do whole food pretty well, but now she can only eat solid food "some of the time" and has been mainly doing liquids only. She also says she has n/v/c/d and severe headaches that started with her treatment.   Pt has PEG tube which she has started to rely more on. Pt asked if her swallowing would get better. Educated pt that because she has just started treatment it will most likely get worse before it gets better. Eventually, she will likely need to rely on PEG for her total nutritional needs and do an oral diet for pleasure only.   She states she would like to use her mouth as long as possible. She is with [facility] and she states that an oral therapist will be coming to her house to work with her and assess her swallow function.   Pt has high needs d/t low BMI, undergoing treatment, and having other physiologically stressful  conditions ie COPD  Estimated needs: Kcals: 2000-2150 (37-40 kcals/kg) Protein: 81-91 (1.5-1.7 g/kg) Fluid: 1.9 liters  Current TF Regimen 2 cans of Jevity 1.2 every 8 hours. Kcals: 1706  Protein: 79.2 Fluid: 1146  Pt reports does not having a strict TF schedule and says size of her feedings and times which they are administered varies. She was very much interested in switching to a 12 hour nocturnal TF.  Unfortunately, because she still is able to eat, she is not yet eligible for her insurance to cover the cost of her feeds. Advised pt to try her best to set up a routine she can follow so she can stay on top of her feeds.    Pt expressed great interest in meeting with me at her next appt on 4/28. Told her we could discuss more strategies to cope with her worsening swallow function  Pt seemed to be very grateful for the call.    Mailed my contact info, coupons, and handouts titled "Soft and Moist High-protein Menu Ideas", "Easy-to-Chew-and Swallow Foods" and "Swallowing difficulties (Dysphagia)"   Burtis Junes RD, LDN Nutrition Pager: [phone] 10/16/2014 12:40 PM

## 2014-10-21 NOTE — Assessment & Plan Note (Addendum)
Stage IVA Squamous Cell Carcinoma, HPV-, moderately differentiated, S/P left neck dissection with sparing of 11th cranial nerve, sternocleidomastoid muscle, internal jugular vein, biopsy , tracheotomy on 07/31/2014.  Beginning concomitant chemoradiation with Cisplatin every 21 days.  Cisplatin Day 1 was 10/08/2014.  Started radiation on 10/09/2014.  Patient's case discussed at [place] on Tuesday and [name] reported improved clinical response thus far which is excellent. This observation is also witnessed today in the clinic  Labs today: CBC  Return as scheduled for follow-up.

## 2014-10-21 NOTE — Progress Notes (Signed)
[name], MD [place] [address]  Malignant neoplasm of base of tongue - Plan: CBC, CBC, heparin lock flush 100 unit/mL, sodium chloride 0.9 % injection 10 mL  CURRENT THERAPY:  INTERVAL HISTORY: Shelby Larson 49 y.o. 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    Malignant neoplasm of base of tongue   08/20/2014 Surgery Diagnosis 1. Tongue, biopsy, left base/ pharynx mass - INVASIVE MODERATELY DIFFERENTIATED SQUAMOUS CELL CARCINOMA. - SEE COMMENT. 2. Lymph nodes, radical neck dissection, Left - THREE OF SIXTEEN LYMPH NODES POSITIVE FOR METASTATIC SQUAMOUS CELL [facility]   09/18/2014 Surgery Multiple extraction of tooth numbers 2, 14, 15, 18, and 31.  4 Quadrants of alveoloplasty.  Gross debridement of remaining dentition. Mandibular left lingual torus reduction   10/08/2014 -  Chemotherapy Cisplatin every 21 days   10/09/2014 -  Radiation Therapy     I personally reviewed and went over laboratory results with the patient.  The results are noted within this dictation.  She is doing well.  Her voice is improved.  Her skin color is great.  She feels better.  She denies any complaints.    Past Medical History  Diagnosis Date  . Heart murmur     as a child  . Chronic bronchitis   . Anxiety     panic attacks  . Depression   . Headache     onset a few months ago  . Neuropathy     had it in both hands  . Arthritis   . Fibromyalgia   . 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 Anemia   . Rosacea   . Hypercholesterolemia   . Asthma   . COPD (chronic obstructive pulmonary disease)   . Seizures     takes Gabapentin  (last one in 2010)    has Malignant neoplasm of pharynx; Malignant  neoplasm of base of tongue; Recurrent cold sores; Therapeutic opioid induced constipation; and Chronic periodontitis on her problem list.     is allergic to bee venom; penicillins; and latex.  We administered heparin lock flush and sodium chloride.  Past Surgical History  Procedure Laterality Date  . Wisdom tooth extraction    . Tonsillectomy Left 08/20/2014    Procedure: TONSILLECTOMY;  Surgeon: Ruby Cola, MD;  Location: [facility];  Service: ENT;  Laterality: Left;  . Radical neck dissection Left 08/20/2014    Procedure: RADICAL LEFT NECK DISSECTION ;  Surgeon: Ruby Cola, MD;  Location: [place];  Service: ENT;  Laterality: Left;  . Tracheostomy tube placement N/A 08/20/2014    Procedure: TRACHEOSTOMY - AWAKE;  Surgeon: Ruby Cola, MD;  Location: [place];  Service: ENT;  Laterality: N/A;  . Gastrostomy tube placement    . Multiple extractions with alveoloplasty N/A 09/17/2014    Procedure: Extraction of tooth #'s [phone] with alveoloplasty, mandibular left lingual torus reduction, and gross debridement of remaining teeth.;  Surgeon: Lenn Cal, DDS;  Location: [place];  Service: Oral Surgery;  Laterality: N/A;    Denies any headaches, dizziness, double vision, fevers, chills, night sweats, nausea, vomiting, diarrhea, constipation, chest pain, heart palpitations, shortness of breath, blood in stool, black tarry stool, urinary pain, urinary burning, urinary  frequency, hematuria.   PHYSICAL EXAMINATION  ECOG PERFORMANCE STATUS: 1 - Symptomatic but completely ambulatory  Filed Vitals:   10/22/14 1110  BP: 107/74  Pulse: 80  Temp: 98.1 F (36.7 C)  Resp: 18    GENERAL:alert, no distress, well nourished, well developed, comfortable, cooperative and smiling SKIN: skin color, texture, turgor are normal, no rashes or significant lesions HEAD: Normocephalic, No masses, lesions, tenderness or abnormalities EYES: normal, PERRLA, EOMI, Conjunctiva are pink and non-injected EARS:  External ears normal OROPHARYNX:lips, buccal mucosa, and tongue normal and mucous membranes are moist  NECK: supple, trachea midline, tracheostomy noted LYMPH:  no palpable lymphadenopathy BREAST:not examined LUNGS: clear to auscultation and percussion HEART: regular rate & rhythm ABDOMEN:non-tender BACK: Back symmetric, no curvature. EXTREMITIES:less then 2 second capillary refill, no skin discoloration  NEURO: alert & oriented x 3 with fluent speech, gait normal   LABORATORY DATA: CBC    Component Value Date/Time   WBC 3.2* 10/22/2014 1125   WBC 11.3* 09/02/2014 1007   RBC 3.75* 10/22/2014 1125   RBC 3.76 09/02/2014 1007   HGB 10.7* 10/22/2014 1125   HGB 11.1* 09/02/2014 1007   HCT 32.1* 10/22/2014 1125   HCT 34.8 09/02/2014 1007   PLT 207 10/22/2014 1125   PLT 396 09/02/2014 1007   MCV 85.6 10/22/2014 1125   MCV 92.6 09/02/2014 1007   MCH 28.5 10/22/2014 1125   MCH 29.5 09/02/2014 1007   MCHC 33.3 10/22/2014 1125   MCHC 31.9 09/02/2014 1007   RDW 15.2 10/22/2014 1125   RDW 15.4* 09/02/2014 1007   LYMPHSABS 1.1 10/08/2014 1017   LYMPHSABS 1.5 09/02/2014 1007   MONOABS 0.8 10/08/2014 1017   MONOABS 1.1* 09/02/2014 1007   EOSABS 0.3 10/08/2014 1017   EOSABS 0.2 09/02/2014 1007   BASOSABS 0.0 10/08/2014 1017   BASOSABS 0.0 09/02/2014 1007      Chemistry      Component Value Date/Time   NA 138 10/08/2014 1017   NA 137 09/02/2014 1007   K 3.4* 10/08/2014 1017   K 4.5 09/02/2014 1007   CL 103 10/08/2014 1017   CO2 26 10/08/2014 1017   CO2 27 09/02/2014 1007   BUN 14 10/08/2014 1017   BUN 10.7 09/02/2014 1007   CREATININE 0.78 10/08/2014 1017   CREATININE 0.8 09/02/2014 1007      Component Value Date/Time   CALCIUM 9.4 10/08/2014 1017   CALCIUM 10.1 09/02/2014 1007   ALKPHOS 98 10/08/2014 1017   AST 20 10/08/2014 1017   ALT 10 10/08/2014 1017   BILITOT 0.4 10/08/2014 1017       RADIOGRAPHIC STUDIES:  Ir Fluoro Guide Cv Line Right  10/02/2014    INDICATION: History of tongue cancer. In need of durable intravenous access for chemotherapy administration.  EXAM: IMPLANTED PORT A CATH PLACEMENT WITH ULTRASOUND AND FLUOROSCOPIC GUIDANCE  COMPARISON:  PET-CT - 09/09/2014  MEDICATIONS: Vancomycin 1 gm IV; The antibiotic was administered within an appropriate time interval prior to skin puncture.  ANESTHESIA/SEDATION: Versed 2 mg IV; Fentanyl 75 mcg IV;  Total Moderate Sedation Time  27 minutes.  CONTRAST:  None  FLUOROSCOPY TIME:  12 seconds (0.3 mGy)  COMPLICATIONS: None immediate  PROCEDURE: The procedure, risks, benefits, and alternatives were explained to the patient. Questions regarding the procedure were encouraged and answered. The patient understands and consents to the procedure.  The right neck and chest were prepped with chlorhexidine in a sterile fashion, and a sterile drape was applied covering the operative field. Maximum barrier  sterile technique with sterile gowns and gloves were used for the procedure. A timeout was performed prior to the initiation of the procedure. Local anesthesia was provided with 1% lidocaine with epinephrine.  After creating a small venotomy incision, a micropuncture kit was utilized to access the internal jugular vein under direct, real-time ultrasound guidance. Ultrasound image documentation was performed. The microwire was kinked to measure appropriate catheter length.  A subcutaneous port pocket was then created along the upper chest wall utilizing a combination of sharp and blunt dissection. The pocket was irrigated with sterile saline. A single lumen ISP power injectable port was chosen for placement. The 8 Fr catheter was tunneled from the port pocket site to the venotomy incision. The port was placed in the pocket. The external catheter was trimmed to appropriate length. At the venotomy, an 8 Fr peel-away sheath was placed over a guidewire under fluoroscopic guidance. The catheter was then placed through the sheath  and the sheath was removed. Final catheter positioning was confirmed and documented with a fluoroscopic spot radiograph. The port was accessed with a Huber needle, aspirated and flushed with heparinized saline.  The venotomy site was closed with an interrupted 4-0 Vicryl suture. The port pocket incision was closed with interrupted 2-0 Vicryl suture and the skin was opposed with a running subcuticular 4-0 Vicryl suture. Dermabond and Steri-strips were applied to both incisions. Dressings were placed. The patient tolerated the procedure well without immediate post procedural complication.  FINDINGS: After catheter placement, the tip lies within the superior cavoatrial junction. The catheter aspirates and flushes normally and is ready for immediate use.  IMPRESSION: Successful placement of a right internal jugular approach power injectable Port-A-Cath. The catheter is ready for immediate use.   Electronically Signed   By: Sandi Mariscal M.D.   On: 10/02/2014 15:48   Ir US Guide Vasc Access Right  10/02/2014   INDICATION: History of tongue cancer. In need of durable intravenous access for chemotherapy administration.  EXAM: IMPLANTED PORT A CATH PLACEMENT WITH ULTRASOUND AND FLUOROSCOPIC GUIDANCE  COMPARISON:  PET-CT - 09/09/2014  MEDICATIONS: Vancomycin 1 gm IV; The antibiotic was administered within an appropriate time interval prior to skin puncture.  ANESTHESIA/SEDATION: Versed 2 mg IV; Fentanyl 75 mcg IV;  Total Moderate Sedation Time  27 minutes.  CONTRAST:  None  FLUOROSCOPY TIME:  12 seconds (0.3 mGy)  COMPLICATIONS: None immediate  PROCEDURE: The procedure, risks, benefits, and alternatives were explained to the patient. Questions regarding the procedure were encouraged and answered. The patient understands and consents to the procedure.  The right neck and chest were prepped with chlorhexidine in a sterile fashion, and a sterile drape was applied covering the operative field. Maximum barrier sterile technique  with sterile gowns and gloves were used for the procedure. A timeout was performed prior to the initiation of the procedure. Local anesthesia was provided with 1% lidocaine with epinephrine.  After creating a small venotomy incision, a micropuncture kit was utilized to access the internal jugular vein under direct, real-time ultrasound guidance. Ultrasound image documentation was performed. The microwire was kinked to measure appropriate catheter length.  A subcutaneous port pocket was then created along the upper chest wall utilizing a combination of sharp and blunt dissection. The pocket was irrigated with sterile saline. A single lumen ISP power injectable port was chosen for placement. The 8 Fr catheter was tunneled from the port pocket site to the venotomy incision. The port was placed in the pocket. The external catheter was trimmed  to appropriate length. At the venotomy, an 8 Fr peel-away sheath was placed over a guidewire under fluoroscopic guidance. The catheter was then placed through the sheath and the sheath was removed. Final catheter positioning was confirmed and documented with a fluoroscopic spot radiograph. The port was accessed with a Huber needle, aspirated and flushed with heparinized saline.  The venotomy site was closed with an interrupted 4-0 Vicryl suture. The port pocket incision was closed with interrupted 2-0 Vicryl suture and the skin was opposed with a running subcuticular 4-0 Vicryl suture. Dermabond and Steri-strips were applied to both incisions. Dressings were placed. The patient tolerated the procedure well without immediate post procedural complication.  FINDINGS: After catheter placement, the tip lies within the superior cavoatrial junction. The catheter aspirates and flushes normally and is ready for immediate use.  IMPRESSION: Successful placement of a right internal jugular approach power injectable Port-A-Cath. The catheter is ready for immediate use.   Electronically Signed    By: Sandi Mariscal M.D.   On: 10/02/2014 15:48     ASSESSMENT AND PLAN:  Malignant neoplasm of base of tongue Stage IVA Squamous Cell Carcinoma, HPV-, moderately differentiated, S/P left neck dissection with sparing of 11th cranial nerve, sternocleidomastoid muscle, internal jugular vein, biopsy , tracheotomy on 07/31/2014.  Beginning concomitant chemoradiation with Cisplatin every 21 days.  Cisplatin Day 1 was 10/08/2014.  Started radiation on 10/09/2014.  Patient's case discussed at [facility] on Tuesday and [name] reported improved clinical response thus far which is excellent. This observation is also witnessed today in the clinic  Labs today: CBC  Return as scheduled for follow-up.   THERAPY PLAN:  Continue with treatment as planned.  All questions were answered. The patient knows to call the clinic with any problems, questions or concerns. We can certainly see the patient much sooner if necessary.  Patient and plan discussed with Dr. Ancil Linsey and she is in agreement with the aforementioned.   This note is electronically signed by: Robynn Pane 10/22/2014 2:51 PM

## 2014-10-22 ENCOUNTER — Encounter (HOSPITAL_COMMUNITY): Payer: Self-pay | Admitting: Dentistry

## 2014-10-22 ENCOUNTER — Encounter: Payer: Self-pay | Admitting: Dietician

## 2014-10-22 ENCOUNTER — Ambulatory Visit (HOSPITAL_COMMUNITY): Payer: Self-pay | Admitting: Dentistry

## 2014-10-22 ENCOUNTER — Encounter (HOSPITAL_COMMUNITY): Payer: 59

## 2014-10-22 ENCOUNTER — Encounter (HOSPITAL_COMMUNITY): Payer: Self-pay | Admitting: Oncology

## 2014-10-22 ENCOUNTER — Encounter (HOSPITAL_BASED_OUTPATIENT_CLINIC_OR_DEPARTMENT_OTHER): Payer: 59 | Admitting: Oncology

## 2014-10-22 VITALS — BP 107/74 | HR 80 | Temp 98.1°F | Resp 18 | Wt 113.7 lb

## 2014-10-22 VITALS — BP 115/78 | HR 90 | Temp 98.8°F

## 2014-10-22 DIAGNOSIS — K1233 Oral mucositis (ulcerative) due to radiation: Secondary | ICD-10-CM

## 2014-10-22 DIAGNOSIS — C01 Malignant neoplasm of base of tongue: Secondary | ICD-10-CM

## 2014-10-22 DIAGNOSIS — R682 Dry mouth, unspecified: Secondary | ICD-10-CM

## 2014-10-22 DIAGNOSIS — R432 Parageusia: Secondary | ICD-10-CM

## 2014-10-22 DIAGNOSIS — K08409 Partial loss of teeth, unspecified cause, unspecified class: Secondary | ICD-10-CM

## 2014-10-22 DIAGNOSIS — R1310 Dysphagia, unspecified: Secondary | ICD-10-CM

## 2014-10-22 DIAGNOSIS — K117 Disturbances of salivary secretion: Secondary | ICD-10-CM

## 2014-10-22 LAB — CBC
HCT: 32.1 % — ABNORMAL LOW (ref 36.0–46.0)
HEMOGLOBIN: 10.7 g/dL — AB (ref 12.0–15.0)
MCH: 28.5 pg (ref 26.0–34.0)
MCHC: 33.3 g/dL (ref 30.0–36.0)
MCV: 85.6 fL (ref 78.0–100.0)
PLATELETS: 207 10*3/uL (ref 150–400)
RBC: 3.75 MIL/uL — ABNORMAL LOW (ref 3.87–5.11)
RDW: 15.2 % (ref 11.5–15.5)
WBC: 3.2 10*3/uL — ABNORMAL LOW (ref 4.0–10.5)

## 2014-10-22 MED ORDER — SODIUM CHLORIDE 0.9 % IJ SOLN
10.0000 mL | INTRAMUSCULAR | Status: DC | PRN
  Administered 2014-10-22: 10 mL via INTRAVENOUS
  Filled 2014-10-22: qty 10

## 2014-10-22 MED ORDER — HEPARIN SOD (PORK) LOCK FLUSH 100 UNIT/ML IV SOLN
500.0000 [IU] | Freq: Once | INTRAVENOUS | Status: AC
  Administered 2014-10-22: 500 [IU] via INTRAVENOUS

## 2014-10-22 NOTE — Patient Instructions (Signed)
[place] at [facility]  Discharge Instructions:  Return as scheduled.  Please call with any questions or concerns.   _______________________________________________________________  Thank you for choosing [place] at [facility] to provide your oncology and hematology care.  To afford each patient quality time with our providers, please arrive at least 15 minutes before your scheduled appointment.  You need to re-schedule your appointment if you arrive 10 or more minutes late.  We strive to give you quality time with our providers, and arriving late affects you and other patients whose appointments are after yours.  Also, if you no show three or more times for appointments you may be dismissed from the clinic.  Again, thank you for choosing [place] at [place] hope is that these requests will allow you access to exceptional care and in a timely manner. _______________________________________________________________  If you have questions after your visit, please contact our office at [phone] between the hours of 8:30 a.m. and 5:00 p.m. Voicemails left after 4:30 p.m. will not be returned until the following business day. _______________________________________________________________  For prescription refill requests, have your pharmacy contact our office. _______________________________________________________________  Recommendations made by the consultant and any test results will be sent to your referring physician. _______________________________________________________________

## 2014-10-22 NOTE — Progress Notes (Signed)
Please see doctors encounter for more inforamtion 

## 2014-10-22 NOTE — Progress Notes (Addendum)
LABS DRAWN.  BY RN THROUGH PORT. CANCEL VP CHARGE

## 2014-10-22 NOTE — Patient Instructions (Signed)
RECOMMENDATIONS: 1. Brush after meals and at bedtime.  Use fluoride at bedtime. 2. Use trismus exercises as directed. 3. Use Biotene Rinse or salt water/baking soda rinses. 4. Multiple sips of water as needed. 5. Return to clinic in two months for oral exam after radiation therapy. Call if problems before then.  Eulogia Dismore F. Ege Muckey, DDS   

## 2014-10-22 NOTE — Progress Notes (Signed)
10/22/2014  Patient Name:   Shelby Larson Date of Birth:   [birth year] Medical Record Number: [id number]  BP 115/78 mmHg  Pulse 90  Temp(Src) 98.8 F (37.1 C) (Oral)  [name] presents for oral examination during radiation therapy. Patient has completed 10/35 radiation treatments. She has had 1 cycle of chemotherapy with the next one due 10/29/2014.  REVIEW OF CHIEF COMPLAINTS:  DRY MOUTH: Yes HARD TO SWALLOW: Yes  HURT TO SWALLOW: No TASTE CHANGES: Yes SORES IN MOUTH: Yes TRISMUS: No problems with trismus   HOME OH REGIMEN:  BRUSHING: After meals and at bedtime. FLOSSING: At bedtime RINSING: Using salt water and baking soda rinses FLUORIDE: Using fluoride at bedtime TRISMUS EXERCISES:  Maximum interincisal opening: 45 mm   DENTAL EXAM:  Oral Hygiene:(PLAQUE): Good oral hygiene LOCATION OF MUCOSITIS: Back of her throat DESCRIPTION OF SALIVA: Decreased saliva. Foamy saliva. ANY EXPOSED BONE: Patient is complaining of exposed bone of the upper right quadrant molar area. No exposed bone is noted today. There is a sharp bony protuberance in the area of the extraction site that should resolve over the next 1-2 months. OTHER WATCHED AREAS: Previous extraction sites. DX: Xerostomia, Dysgeusia, Dysphagia, Weight Loss and Mucositis  RECOMMENDATIONS: 1. Brush after meals and at bedtime.  Use fluoride at bedtime. 2. Use trismus exercises as directed. 3. Use Biotene Rinse or salt water/baking soda rinses. 4. Multiple sips of water as needed. 5. Return to clinic in two months for oral exam after radiation therapy. Call if problems before then.  Lenn Cal, DDS

## 2014-10-22 NOTE — Progress Notes (Signed)
Shelby Larson presented for Portacath access and flush.  Portacath located right chest wall accessed with  H 20 needle.  Good blood return present. Portacath flushed with 66ml NS and 500U/8ml Heparin and needle removed intact.  Procedure tolerated well and without incident.

## 2014-10-22 NOTE — Progress Notes (Signed)
Visited pt during her appt.   Wt Readings from Last 10 Encounters:  10/22/14 113 lb 11.2 oz (51.574 kg)  10/14/14 118 lb 4.8 oz (53.661 kg)  10/08/14 123 lb 9.6 oz (56.065 kg)  09/17/14 121 lb (54.885 kg)  09/14/14 122 lb 9.6 oz (55.611 kg)  09/02/14 122 lb 8 oz (55.566 kg)  08/28/14 136 lb 11 oz (62 kg)  08/08/14 121 lb (54.885 kg)   Pt has lost 10 lbs in 2 weeks  Patient reports oral intake as fair and is suffering from symptoms including Taste changes and dry mouth.  From how pt described eating habits she seems to be doing fairly well with her intake, though her weights dont reflect this. She says she has a variable appetite. On days she has an appetite she reports doing three meals. On other days she will use her PEG tube. She puts a variety of formulas in those including equate, jevity, and osmolite. She has been having a very dry mouth and a metallic taste in her mouth.  Instructed patient to try to have 1 high kcal/pro food at each meal from the handout I gave her. Also gave her 1 of each Ensure Enlive flavor so she could find the one she likes. Finally gave her some tips on how to keep her mouth moist and avoid the taste changes  Pt seemed to be very thankful for the reccomendations  Left my contact info, coupons, samples, and handouts titled "Increasing protein and calories", "Tast and smell changes" and "Dry Mouth or Thick Saliva"    Burtis Junes RD, LDN Nutrition Pager: [phone] 10/22/2014 11:54 AM

## 2014-10-27 ENCOUNTER — Ambulatory Visit (HOSPITAL_COMMUNITY): Payer: 59 | Admitting: Physical Therapy

## 2014-10-29 ENCOUNTER — Encounter (HOSPITAL_BASED_OUTPATIENT_CLINIC_OR_DEPARTMENT_OTHER): Payer: 59 | Admitting: Hematology & Oncology

## 2014-10-29 ENCOUNTER — Encounter (HOSPITAL_COMMUNITY): Payer: Self-pay | Admitting: Hematology & Oncology

## 2014-10-29 ENCOUNTER — Encounter (HOSPITAL_COMMUNITY): Payer: 59 | Attending: Hematology & Oncology

## 2014-10-29 VITALS — BP 90/60 | HR 98 | Temp 98.0°F | Resp 20 | Wt 114.2 lb

## 2014-10-29 DIAGNOSIS — E876 Hypokalemia: Secondary | ICD-10-CM | POA: Diagnosis not present

## 2014-10-29 DIAGNOSIS — K1231 Oral mucositis (ulcerative) due to antineoplastic therapy: Secondary | ICD-10-CM

## 2014-10-29 DIAGNOSIS — Z5111 Encounter for antineoplastic chemotherapy: Secondary | ICD-10-CM

## 2014-10-29 DIAGNOSIS — Z87891 Personal history of nicotine dependence: Secondary | ICD-10-CM | POA: Insufficient documentation

## 2014-10-29 DIAGNOSIS — C01 Malignant neoplasm of base of tongue: Secondary | ICD-10-CM

## 2014-10-29 DIAGNOSIS — R590 Localized enlarged lymph nodes: Secondary | ICD-10-CM

## 2014-10-29 DIAGNOSIS — F419 Anxiety disorder, unspecified: Secondary | ICD-10-CM

## 2014-10-29 DIAGNOSIS — R110 Nausea: Secondary | ICD-10-CM

## 2014-10-29 LAB — CBC WITH DIFFERENTIAL/PLATELET
Basophils Absolute: 0 10*3/uL (ref 0.0–0.1)
Basophils Relative: 1 % (ref 0–1)
EOS ABS: 0.1 10*3/uL (ref 0.0–0.7)
Eosinophils Relative: 3 % (ref 0–5)
HCT: 28.8 % — ABNORMAL LOW (ref 36.0–46.0)
Hemoglobin: 9.6 g/dL — ABNORMAL LOW (ref 12.0–15.0)
LYMPHS ABS: 0.8 10*3/uL (ref 0.7–4.0)
Lymphocytes Relative: 29 % (ref 12–46)
MCH: 28.4 pg (ref 26.0–34.0)
MCHC: 33.3 g/dL (ref 30.0–36.0)
MCV: 85.2 fL (ref 78.0–100.0)
Monocytes Absolute: 0.7 10*3/uL (ref 0.1–1.0)
Monocytes Relative: 27 % — ABNORMAL HIGH (ref 3–12)
Neutro Abs: 1.1 10*3/uL — ABNORMAL LOW (ref 1.7–7.7)
Neutrophils Relative %: 40 % — ABNORMAL LOW (ref 43–77)
Platelets: 208 10*3/uL (ref 150–400)
RBC: 3.38 MIL/uL — ABNORMAL LOW (ref 3.87–5.11)
RDW: 15.5 % (ref 11.5–15.5)
WBC: 2.7 10*3/uL — AB (ref 4.0–10.5)

## 2014-10-29 LAB — COMPREHENSIVE METABOLIC PANEL
ALBUMIN: 3.4 g/dL — AB (ref 3.5–5.0)
ALT: 10 U/L — ABNORMAL LOW (ref 14–54)
AST: 19 U/L (ref 15–41)
Alkaline Phosphatase: 67 U/L (ref 38–126)
Anion gap: 11 (ref 5–15)
BILIRUBIN TOTAL: 0.7 mg/dL (ref 0.3–1.2)
BUN: 9 mg/dL (ref 6–20)
CO2: 27 mmol/L (ref 22–32)
Calcium: 9.2 mg/dL (ref 8.9–10.3)
Chloride: 97 mmol/L — ABNORMAL LOW (ref 101–111)
Creatinine, Ser: 0.72 mg/dL (ref 0.44–1.00)
GFR calc Af Amer: >60 mL/min (ref >60)
Glucose, Bld: 97 mg/dL (ref 70–99)
POTASSIUM: 3.2 mmol/L — AB (ref 3.5–5.1)
SODIUM: 135 mmol/L (ref 135–145)
TOTAL PROTEIN: 7.4 g/dL (ref 6.5–8.1)

## 2014-10-29 LAB — MAGNESIUM: Magnesium: 1.6 mg/dL — ABNORMAL LOW (ref 1.7–2.4)

## 2014-10-29 MED ORDER — HYDROCODONE-ACETAMINOPHEN 7.5-325 MG/15ML PO SOLN: 10.0000 mL | Freq: Four times a day (QID) | ORAL | Status: DC | PRN

## 2014-10-29 MED ORDER — LORAZEPAM 2 MG/ML IJ SOLN
2.0000 mg | Freq: Once | INTRAMUSCULAR | Status: AC
  Administered 2014-10-29: 2 mg via INTRAVENOUS

## 2014-10-29 MED ORDER — SODIUM CHLORIDE 0.9 % IJ SOLN
10.0000 mL | INTRAMUSCULAR | Status: DC | PRN
  Administered 2014-10-29: 10 mL
  Filled 2014-10-29: qty 10

## 2014-10-29 MED ORDER — SODIUM CHLORIDE 0.9 % IV SOLN
100.0000 mg/m2 | Freq: Once | INTRAVENOUS | Status: AC
  Administered 2014-10-29: 160 mg via INTRAVENOUS
  Filled 2014-10-29: qty 160

## 2014-10-29 MED ORDER — PALONOSETRON HCL INJECTION 0.25 MG/5ML
INTRAVENOUS | Status: AC
  Filled 2014-10-29: qty 5

## 2014-10-29 MED ORDER — LORAZEPAM 1 MG PO TABS: ORAL_TABLET | ORAL | Status: DC

## 2014-10-29 MED ORDER — POTASSIUM CHLORIDE 2 MEQ/ML IV SOLN
Freq: Once | INTRAVENOUS | Status: AC
  Administered 2014-10-29: 10:00:00 via INTRAVENOUS
  Filled 2014-10-29: qty 10

## 2014-10-29 MED ORDER — SODIUM CHLORIDE 0.9 % IV SOLN
2.0000 g | Freq: Once | INTRAVENOUS | Status: AC
  Administered 2014-10-29: 2 g via INTRAVENOUS
  Filled 2014-10-29: qty 4

## 2014-10-29 MED ORDER — FENTANYL 50 MCG/HR TD PT72: 50.0000 ug | MEDICATED_PATCH | TRANSDERMAL | Status: DC

## 2014-10-29 MED ORDER — POTASSIUM BICARB-CITRIC ACID 20 MEQ PO TBEF: EFFERVESCENT_TABLET | ORAL | Status: DC

## 2014-10-29 MED ORDER — SODIUM CHLORIDE 0.9 % IV SOLN
Freq: Once | INTRAVENOUS | Status: AC
  Administered 2014-10-29: 12:00:00 via INTRAVENOUS
  Filled 2014-10-29: qty 5

## 2014-10-29 MED ORDER — PALONOSETRON HCL INJECTION 0.25 MG/5ML
0.2500 mg | Freq: Once | INTRAVENOUS | Status: AC
  Administered 2014-10-29: 0.25 mg via INTRAVENOUS

## 2014-10-29 MED ORDER — BENECALORIE PO LIQD: 1.0000 | Freq: Two times a day (BID) | ORAL | Status: DC

## 2014-10-29 MED ORDER — LIDOCAINE-PRILOCAINE 2.5-2.5 % EX CREA
TOPICAL_CREAM | Freq: Once | CUTANEOUS | Status: AC
  Administered 2014-10-29: 1 via TOPICAL
  Filled 2014-10-29: qty 5

## 2014-10-29 MED ORDER — LORAZEPAM 2 MG/ML IJ SOLN
INTRAMUSCULAR | Status: AC
  Filled 2014-10-29: qty 1

## 2014-10-29 MED ORDER — HEPARIN SOD (PORK) LOCK FLUSH 100 UNIT/ML IV SOLN
500.0000 [IU] | Freq: Once | INTRAVENOUS | Status: AC | PRN
  Administered 2014-10-29: 500 [IU]
  Filled 2014-10-29: qty 5

## 2014-10-29 NOTE — Progress Notes (Signed)
[facility] 1100; Dr. Whitney Muse notified. Proceed with chemotherapy per MD.  Tolerated treatment w/o adverse reaction.  A&Ox4.  In no distress.  Discharged via wheelchair and left in c/o friend for transport home.

## 2014-10-29 NOTE — Progress Notes (Addendum)
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   He wishes to quit radiation therapy because she cannot stand laying flat on the radiation table. She went today for treatment but left before being treated. She is inquiring whether or not she really needs radiation. She describes her tongue is very sore. She has a lot of mucus production through the trach. She reports thick secretions. Friend reports pt has quit smoking. Pt reports nausea from secretions. Pt is currently taking Ativan 3 times a day for anxiety but wants to increase the dose prior to XRT if she is to continue with XRT.  Pt has Magic mouth wash with Lidocaine in it, denies it having a numbing effect.   8 pound weight loss over the past 4 weeks. She is enquiring about adding benecalorie to her tube feeds.    Malignant neoplasm of base of tongue   08/20/2014 Surgery Diagnosis 1. Tongue, biopsy, left base/ pharynx mass - INVASIVE MODERATELY DIFFERENTIATED SQUAMOUS CELL CARCINOMA. - SEE COMMENT. 2. Lymph nodes, radical neck dissection, Left - THREE OF SIXTEEN LYMPH NODES POSITIVE FOR METASTATIC SQUAMOUS CELL [facility]   09/18/2014 Surgery Multiple extraction of tooth numbers 2, 14, 15, 18, and 31.  4 Quadrants of alveoloplasty.  Gross debridement of remaining dentition. Mandibular left lingual torus reduction   10/08/2014 -  Chemotherapy Cisplatin every 21 days   10/09/2014 -  Radiation Therapy      Past Medical History  Diagnosis Date  . Heart murmur     as a child  . Chronic bronchitis   . Anxiety     panic attacks  . Depression   . Headache     onset a few months ago  . Neuropathy     had it in both hands  . Arthritis   . Fibromyalgia   . 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 Anemia   . Rosacea   . Hypercholesterolemia   . Asthma   . COPD (chronic obstructive pulmonary disease)   . Seizures     takes Gabapentin  (last one in 2010)    has Malignant neoplasm of pharynx; Malignant neoplasm of base of tongue; Recurrent cold sores; Therapeutic opioid induced constipation; and Chronic periodontitis on her problem list.     is allergic to bee venom; penicillins; and latex.  Shelby Larson had no medications administered during this visit.  Past Surgical History  Procedure Laterality Date  . Wisdom tooth extraction    . Tonsillectomy Left 08/20/2014    Procedure: TONSILLECTOMY;  Surgeon: Ruby Cola, MD;  Location: [facility];  Service: ENT;  Laterality: Left;  . Radical neck dissection Left 08/20/2014  Procedure: RADICAL LEFT NECK DISSECTION ;  Surgeon: Ruby Cola, MD;  Location: [place];  Service: ENT;  Laterality: Left;  . Tracheostomy tube placement N/A 08/20/2014    Procedure: TRACHEOSTOMY - AWAKE;  Surgeon: Ruby Cola, MD;  Location: [place];  Service: ENT;  Laterality: N/A;  . Gastrostomy tube placement    . Multiple extractions with alveoloplasty N/A 09/17/2014    Procedure: Extraction of tooth #'s [phone] with alveoloplasty, mandibular left lingual torus reduction, and gross debridement of remaining teeth.;  Surgeon: Lenn Cal, DDS;   Location: [place];  Service: Oral Surgery;  Laterality: N/A;    Pt reports congestion. Pt reports nausea from secretions. 14 point review of systems is performed and is negative except as detailed in history of present illness   PHYSICAL EXAMINATION  Pain from feeding tube, advised to tape it up to avoid it moving around.   ECOG PERFORMANCE STATUS: 1 - Symptomatic but completely ambulatory  There were no vitals filed for this visit.  GENERAL:alert, no distress, well nourished, well developed, comfortable, cooperative and smiling SKIN: skin color, texture, turgor are normal, no rashes or significant lesions HEAD: Normocephalic, No masses, lesions, tenderness or abnormalities EYES: normal, PERRLA, EOMI, Conjunctiva are pink and non-injected EARS: External ears normal OROPHARYNX:lips, buccal mucosa, and tongue normal and mucous membranes are moist Thick secretions noted, erythema from XRT noted.  NECK: supple, trachea midline, tracheostomy noted, yellow to clear secretions LYMPH:  no palpable lymphadenopathy BREAST:not examined LUNGS: clear to auscultation and percussion, rare rhonchi HEART: regular rate & rhythm ABDOMEN:non-tender BACK: Back symmetric, no curvature. EXTREMITIES:less then 2 second capillary refill, no skin discoloration  NEURO: alert & oriented x 3 with fluent speech, gait normal   LABORATORY DATA: CBC    Component Value Date/Time   WBC 2.7* 10/29/2014 1021   WBC 11.3* 09/02/2014 1007   RBC 3.38* 10/29/2014 1021   RBC 3.76 09/02/2014 1007   HGB 9.6* 10/29/2014 1021   HGB 11.1* 09/02/2014 1007   HCT 28.8* 10/29/2014 1021   HCT 34.8 09/02/2014 1007   PLT 208 10/29/2014 1021   PLT 396 09/02/2014 1007   MCV 85.2 10/29/2014 1021   MCV 92.6 09/02/2014 1007   MCH 28.4 10/29/2014 1021   MCH 29.5 09/02/2014 1007   MCHC 33.3 10/29/2014 1021   MCHC 31.9 09/02/2014 1007   RDW 15.5 10/29/2014 1021   RDW 15.4* 09/02/2014 1007   LYMPHSABS 0.8 10/29/2014 1021    LYMPHSABS 1.5 09/02/2014 1007   MONOABS 0.7 10/29/2014 1021   MONOABS 1.1* 09/02/2014 1007   EOSABS 0.1 10/29/2014 1021   EOSABS 0.2 09/02/2014 1007   BASOSABS 0.0 10/29/2014 1021   BASOSABS 0.0 09/02/2014 1007      Chemistry      Component Value Date/Time   NA 135 10/29/2014 1021   NA 137 09/02/2014 1007   K 3.2* 10/29/2014 1021   K 4.5 09/02/2014 1007   CL 97* 10/29/2014 1021   CO2 27 10/29/2014 1021   CO2 27 09/02/2014 1007   BUN 9 10/29/2014 1021   BUN 10.7 09/02/2014 1007   CREATININE 0.72 10/29/2014 1021   CREATININE 0.8 09/02/2014 1007      Component Value Date/Time   CALCIUM 9.2 10/29/2014 1021   CALCIUM 10.1 09/02/2014 1007   ALKPHOS 67 10/29/2014 1021   AST 19 10/29/2014 1021   ALT 10* 10/29/2014 1021   BILITOT 0.7 10/29/2014 1021       ASSESSMENT AND PLAN:  T2N2cMx HPV negative moderately  differentiated Stage IVA Squamous cell carcinoma, base of tongue Mucositis Nausea Weight loss Anxiety Hypokalemia Hypomagnesemia  I have given her a prescription for liquid pain medications to use for her worsening oropharyngeal discomfort. We increased the dose of her duragesic patch. Also called her in a liquid potassium supplement which she is to use through her PEG tube twice daily. I have increased her Ativan for both her nausea and anxiety. I have instructed her on how to take it starting half an hour prior to her radiation. I advised her that discontinuing radiation would not be recommended. We discussed the necessity of radiation in treating her disease.  She was given a mucositis sheet. We reviewed in detail. We reviewed salt and soda rinse, liquid Mucinex, elevating the head of her bed and using a " cool mist vaporizer" all as ways to try to improve her symptoms.  She was given additional magnesium IV today and we will follow her magnesium moving forward. I will see her back next week for ongoing monitoring of her weight and symptom control.  THERAPY PLAN:    Continue with treatment as planned. We will add neulasta after this cycle given her [place] today.   All questions were answered. The patient knows to call the clinic with any problems, questions or concerns. We can certainly see the patient much sooner if necessary.   This document serves as a record of services personally performed by Ancil Linsey, MD. It was created on her behalf by Arlyce Harman, a trained medical scribe. The creation of this record is based on the scribe's personal observations and the provider's statements to them. This document has been checked and approved by the attending provider. This note was signed electronically. I have reviewed the above documentation for accuracy and completeness and I agree with the above.   Ancil Linsey, MD

## 2014-10-29 NOTE — Patient Instructions (Signed)
[facility] Discharge Instructions for Patients Receiving Chemotherapy  Today you received the following chemotherapy agents: Cisplatin Your potassium and magnesium levels were low today:  You received potassium and magnesium supplementation IV through your port.  Dr. Whitney Muse has prescribed a potassium supplement for you to pick up at your pharmacy.  If you develop nausea and vomiting, or diarrhea that is not controlled by your medication, call the clinic.  The clinic phone number is [phone]. Office hours are Monday-Friday 8:30am-5:00pm.  BELOW ARE SYMPTOMS THAT SHOULD BE REPORTED IMMEDIATELY:  *FEVER GREATER THAN 101.0 F  *CHILLS WITH OR WITHOUT FEVER  NAUSEA AND VOMITING THAT IS NOT CONTROLLED WITH YOUR NAUSEA MEDICATION  *UNUSUAL SHORTNESS OF BREATH  *UNUSUAL BRUISING OR BLEEDING  TENDERNESS IN MOUTH AND THROAT WITH OR WITHOUT PRESENCE OF ULCERS  *URINARY PROBLEMS  *BOWEL PROBLEMS  UNUSUAL RASH Items with * indicate a potential emergency and should be followed up as soon as possible. If you have an emergency after office hours please contact your primary care physician or go to the nearest emergency department.  Please call the clinic during office hours if you have any questions or concerns.   You may also contact the Patient Navigator at [phone] should you have any questions or need assistance in obtaining follow up care. _____________________________________________________________________ Have you asked about our STAR program?    STAR stands for Survivorship Training and Rehabilitation, and this is a nationally recognized cancer care program that focuses on survivorship and rehabilitation.  Cancer and cancer treatments may cause problems, such as, pain, making you feel tired and keeping you from doing the things that you need or want to do. Cancer rehabilitation can help. Our goal is to reduce these troubling effects and help you have the best  quality of life possible.  You may receive a survey from a nurse that asks questions about your current state of health.  Based on the survey results, all eligible patients will be referred to the [facility] program for an evaluation so we can better serve you! A frequently asked questions sheet is available upon request.

## 2014-10-30 ENCOUNTER — Encounter (HOSPITAL_BASED_OUTPATIENT_CLINIC_OR_DEPARTMENT_OTHER): Payer: 59

## 2014-10-30 VITALS — BP 116/78 | HR 83 | Temp 98.0°F | Resp 20

## 2014-10-30 DIAGNOSIS — Z5189 Encounter for other specified aftercare: Secondary | ICD-10-CM | POA: Diagnosis not present

## 2014-10-30 DIAGNOSIS — C01 Malignant neoplasm of base of tongue: Secondary | ICD-10-CM

## 2014-10-30 MED ORDER — PEGFILGRASTIM INJECTION 6 MG/0.6ML ~~LOC~~
6.0000 mg | PREFILLED_SYRINGE | Freq: Once | SUBCUTANEOUS | Status: AC
  Administered 2014-10-30: 6 mg via SUBCUTANEOUS

## 2014-10-30 MED ORDER — PEGFILGRASTIM INJECTION 6 MG/0.6ML ~~LOC~~
PREFILLED_SYRINGE | SUBCUTANEOUS | Status: AC
Start: 2014-10-30 — End: 2014-10-30
  Filled 2014-10-30: qty 0.6

## 2014-10-30 NOTE — Patient Instructions (Signed)
[place] at [facility] Discharge Instructions  RECOMMENDATIONS MADE BY THE CONSULTANT AND ANY TEST RESULTS WILL BE SENT TO YOUR REFERRING PHYSICIAN.  Neulasta injection administered as ordered. Return as scheduled.  Thank you for choosing [place] at [facility] to provide your oncology and hematology care.  To afford each patient quality time with our provider, please arrive at least 15 minutes before your scheduled appointment time.    You need to re-schedule your appointment should you arrive 10 or more minutes late.  We strive to give you quality time with our providers, and arriving late affects you and other patients whose appointments are after yours.  Also, if you no show three or more times for appointments you may be dismissed from the clinic at the providers discretion.     Again, thank you for choosing [facility].  Our hope is that these requests will decrease the amount of time that you wait before being seen by our physicians.       _____________________________________________________________  Should you have questions after your visit to [facility], please contact our office at [phone] between the hours of 8:30 a.m. and 4:30 p.m.  Voicemails left after 4:30 p.m. will not be returned until the following business day.  For prescription refill requests, have your pharmacy contact our office.

## 2014-10-30 NOTE — Progress Notes (Signed)
Shelby Larson presents today for injection per MD orders. Neulasta 6mg  administered SQ in right Abdomen. Administration without incident. Patient tolerated well.

## 2014-11-04 ENCOUNTER — Ambulatory Visit (HOSPITAL_COMMUNITY): Payer: Self-pay | Admitting: Physical Therapy

## 2014-11-05 ENCOUNTER — Ambulatory Visit (HOSPITAL_COMMUNITY): Payer: Self-pay | Admitting: Physical Therapy

## 2014-11-05 ENCOUNTER — Encounter: Payer: Self-pay | Admitting: Dietician

## 2014-11-05 MED ORDER — OSMOLITE 1.2 CAL PO LIQD: ORAL | Status: DC

## 2014-11-05 NOTE — Progress Notes (Signed)
Was asked to reach out to patient in regards to continued weight loss.   Contacted Pt by Phone   Wt Readings from Last 10 Encounters:  10/29/14 114 lb 3.2 oz (51.801 kg)  10/22/14 113 lb 11.2 oz (51.574 kg)  10/14/14 118 lb 4.8 oz (53.661 kg)  10/08/14 123 lb 9.6 oz (56.065 kg)  09/17/14 121 lb (54.885 kg)  09/14/14 122 lb 9.6 oz (55.611 kg)  09/02/14 122 lb 8 oz (55.566 kg)  08/28/14 136 lb 11 oz (62 kg)  08/08/14 121 lb (54.885 kg)   Patient weight has decreased by 4 more pounds over the last 2 weeks. BMI is now 18.4-criteria for underweight status  Pt is only doing jello/magic cup now. She is doing it because she "wants to preserve her weight/muscle".  Has been using Ensure/equate in her PEG too.  She states with continued radiation, her swallowing function is much worse and she sounds to be eating the jello/magic cups out of the necessity, rather than by choice.  Still suffering from nausea. Also says she has a little bit of diarrhea.   Up until now, the pt has been using TF formulas on a PRN basis. I think that patient needs a much more structured TF regimen to maintain her weight which she has been unable to maintain weight with PO intake to this point.   Estimated Needs: 1800-2100 (35-40 kcal/kg) Protein: >78g (1.5g/kg) Fluid: 1.8 liters  TF Regimen: 7 cans Osmolite 1.2- split into 3 feeding of 2-3-2 cans. 75 mls flush before and after each feed.   Kcals: 1990 Protein: 92  g Pro Fluid: 1365 mls fluid  (+450 from flushes)  Will visit pt tomorrow at her appointment to discuss the feeding.    Burtis Junes RD, LDN Nutrition Pager: [phone] 11/05/2014 3:06 PM

## 2014-11-06 ENCOUNTER — Encounter (HOSPITAL_BASED_OUTPATIENT_CLINIC_OR_DEPARTMENT_OTHER): Payer: 59 | Admitting: Hematology & Oncology

## 2014-11-06 ENCOUNTER — Encounter (HOSPITAL_COMMUNITY): Payer: Self-pay | Admitting: Hematology & Oncology

## 2014-11-06 ENCOUNTER — Encounter: Payer: Self-pay | Admitting: Dietician

## 2014-11-06 ENCOUNTER — Encounter (HOSPITAL_BASED_OUTPATIENT_CLINIC_OR_DEPARTMENT_OTHER): Payer: 59

## 2014-11-06 VITALS — BP 119/78 | HR 86 | Temp 98.5°F | Resp 16

## 2014-11-06 VITALS — BP 94/63 | HR 110 | Temp 98.9°F | Resp 16 | Wt 111.5 lb

## 2014-11-06 DIAGNOSIS — F419 Anxiety disorder, unspecified: Secondary | ICD-10-CM

## 2014-11-06 DIAGNOSIS — C140 Malignant neoplasm of pharynx, unspecified: Secondary | ICD-10-CM

## 2014-11-06 DIAGNOSIS — E860 Dehydration: Secondary | ICD-10-CM

## 2014-11-06 DIAGNOSIS — K1230 Oral mucositis (ulcerative), unspecified: Secondary | ICD-10-CM | POA: Diagnosis not present

## 2014-11-06 DIAGNOSIS — R110 Nausea: Secondary | ICD-10-CM

## 2014-11-06 DIAGNOSIS — C01 Malignant neoplasm of base of tongue: Secondary | ICD-10-CM

## 2014-11-06 DIAGNOSIS — C760 Malignant neoplasm of head, face and neck: Secondary | ICD-10-CM

## 2014-11-06 MED ORDER — HEPARIN SOD (PORK) LOCK FLUSH 100 UNIT/ML IV SOLN
INTRAVENOUS | Status: AC
Start: 2014-11-06 — End: 2014-11-06
  Filled 2014-11-06: qty 5

## 2014-11-06 MED ORDER — HEPARIN SOD (PORK) LOCK FLUSH 100 UNIT/ML IV SOLN
500.0000 [IU] | Freq: Once | INTRAVENOUS | Status: AC
  Administered 2014-11-06: 500 [IU] via INTRAVENOUS

## 2014-11-06 MED ORDER — SODIUM CHLORIDE 0.9 % IV SOLN
INTRAVENOUS | Status: DC
  Administered 2014-11-06: 15:00:00 via INTRAVENOUS

## 2014-11-06 MED ORDER — SODIUM CHLORIDE 0.9 % IJ SOLN
10.0000 mL | Freq: Once | INTRAMUSCULAR | Status: AC
  Administered 2014-11-06: 10 mL via INTRAVENOUS

## 2014-11-06 NOTE — Patient Instructions (Signed)
..  [place]hel[place] L[place] at [facility] Discharge Instructions  RECOMMENDATIONS MADE BY THE CONSULTANT AND ANY TEST RESULTS WILL BE SENT TO YOUR REFERRING PHYSICIAN.  Exam with Dr. Whitney Muse. Continue treatment plan as scheduled. Follow up appointment in 1 week. Call for any new or worsening symptoms, questions, or concerns.   Thank you for choosing [place] at [facility] to provide your oncology and hematology care.  To afford each patient quality time with our provider, please arrive at least 15 minutes before your scheduled appointment time.    You need to re-schedule your appointment should you arrive 10 or more minutes late.  We strive to give you quality time with our providers, and arriving late affects you and other patients whose appointments are after yours.  Also, if you no show three or more times for appointments you may be dismissed from the clinic at the providers discretion.     Again, thank you for choosing [facility].  Our hope is that these requests will decrease the amount of time that you wait before being seen by our physicians.       _____________________________________________________________  Should you have questions after your visit to [facility], please contact our office at [phone] between the hours of 8:30 a.m. and 4:30 p.m.  Voicemails left after 4:30 p.m. will not be returned until the following business day.  For prescription refill requests, have your pharmacy contact our office.

## 2014-11-06 NOTE — Progress Notes (Signed)
Tolerated IV fluids well. 

## 2014-11-06 NOTE — Progress Notes (Signed)
Briefly talked to pt explaining her new TF regimen. Osmolite 1.2 7 cans split into 3 feedings. Told her to contact me with any issues of tolerance.

## 2014-11-06 NOTE — Progress Notes (Signed)
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   She feels weak, she feels "dehydrated" She has some dizziness when she stands. She notes throat pain and intermittent SOB.  She has nebulizer treatments at home via home health which she does three to four times daily.  She is taking ativan prior to XRT and is doing much better tolerating her therapy.     Malignant neoplasm of base of tongue   08/20/2014 Surgery Diagnosis 1. Tongue, biopsy, left base/ pharynx mass - INVASIVE MODERATELY DIFFERENTIATED SQUAMOUS CELL CARCINOMA. - SEE COMMENT. 2. Lymph nodes, radical neck dissection, Left - THREE OF SIXTEEN LYMPH NODES POSITIVE FOR METASTATIC SQUAMOUS CELL [facility]   09/18/2014 Surgery Multiple extraction of tooth numbers 2, 14, 15, 18, and 31.  4 Quadrants of alveoloplasty.  Gross debridement of remaining dentition. Mandibular left lingual torus reduction   10/08/2014 -  Chemotherapy Cisplatin every 21 days   10/09/2014 -  Radiation Therapy      Past Medical History  Diagnosis Date  . Heart murmur     as a child  . Chronic bronchitis   . Anxiety     panic attacks  . Depression    . Headache     onset a few months ago  . Neuropathy     had it in both hands  . Arthritis   . Fibromyalgia   . 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 Anemia   . Rosacea   . Hypercholesterolemia   . Asthma   . COPD (chronic obstructive pulmonary disease)   . Seizures     takes Gabapentin  (last one in 2010)    has Malignant neoplasm of pharynx; Malignant neoplasm of base of tongue; Recurrent cold sores; Therapeutic opioid induced constipation; and Chronic periodontitis on her problem list.     is allergic to bee venom; penicillins; and latex.  Shelby Larson does not currently have medications on file.  Past Surgical History  Procedure Laterality Date  . Wisdom tooth extraction    . Tonsillectomy Left 08/20/2014    Procedure: TONSILLECTOMY;  Surgeon: Ruby Cola, MD;  Location: [facility];  Service: ENT;  Laterality: Left;  . Radical neck dissection Left 08/20/2014    Procedure: RADICAL LEFT NECK DISSECTION ;  Surgeon: Ruby Cola, MD;  Location: [place];  Service: ENT;  Laterality: Left;  . Tracheostomy tube placement N/A 08/20/2014    Procedure: TRACHEOSTOMY - AWAKE;  Surgeon: Ruby Cola, MD;  Location: [place];  Service: ENT;  Laterality: N/A;  . Gastrostomy tube placement    . Multiple extractions with alveoloplasty N/A 09/17/2014    Procedure: Extraction of tooth #'s  [id number] with alveoloplasty, mandibular left lingual torus reduction, and gross debridement of remaining teeth.;  Surgeon: Lenn Cal, DDS;  Location: [place];  Service: Oral Surgery;  Laterality: N/A;    Pt reports congestion. Pt reports nausea from secretions. 14 point review of systems is performed and is negative except as detailed in history of present illness   PHYSICAL EXAMINATION  Pain from feeding tube, advised to tape it up to avoid it moving around.   ECOG PERFORMANCE STATUS: 1 - Symptomatic but completely ambulatory  Filed Vitals:   11/06/14 1352    BP: 94/63  Pulse: 110  Temp: 98.9 F (37.2 C)  Resp: 16    GENERAL:alert, no distress, well nourished, well developed, comfortable, lying on the exam table but sits up to talk with me SKIN: skin color, texture, turgor are normal, no rashes or significant lesions HEAD: Normocephalic, No masses, lesions, tenderness or abnormalities EYES: normal, PERRLA, EOMI, Conjunctiva are pink and non-injected EARS: External ears normal OROPHARYNX:lips, buccal mucosa, and tongue normal and mucous membranes are moist Thick secretions noted, erythema from XRT noted.  NECK: supple, trachea midline, tracheostomy noted, yellow to clear secretions bilateral skin erythema LYMPH:  no palpable lymphadenopathy BREAST:not examined LUNGS: clear to auscultation and percussion, rare rhonchi HEART: regular rhythm, tachycardic ABDOMEN:non-tender BACK: Back symmetric, no curvature. EXTREMITIES:less then 2 second capillary refill, no skin discoloration  NEURO: alert & oriented x 3 with fluent speech, gait normal   LABORATORY DATA: CBC    Component Value Date/Time   WBC 2.7* 10/29/2014 1021   WBC 11.3* 09/02/2014 1007   RBC 3.38* 10/29/2014 1021   RBC 3.76 09/02/2014 1007   HGB 9.6* 10/29/2014 1021   HGB 11.1* 09/02/2014 1007   HCT 28.8* 10/29/2014 1021   HCT 34.8 09/02/2014 1007   PLT 208 10/29/2014 1021   PLT 396 09/02/2014 1007   MCV 85.2 10/29/2014 1021   MCV 92.6 09/02/2014 1007   MCH 28.4 10/29/2014 1021   MCH 29.5 09/02/2014 1007   MCHC 33.3 10/29/2014 1021   MCHC 31.9 09/02/2014 1007   RDW 15.5 10/29/2014 1021   RDW 15.4* 09/02/2014 1007   LYMPHSABS 0.8 10/29/2014 1021   LYMPHSABS 1.5 09/02/2014 1007   MONOABS 0.7 10/29/2014 1021   MONOABS 1.1* 09/02/2014 1007   EOSABS 0.1 10/29/2014 1021   EOSABS 0.2 09/02/2014 1007   BASOSABS 0.0 10/29/2014 1021   BASOSABS 0.0 09/02/2014 1007      Chemistry      Component Value Date/Time   NA 135 10/29/2014 1021   NA 137 09/02/2014 1007   K 3.2*  10/29/2014 1021   K 4.5 09/02/2014 1007   CL 97* 10/29/2014 1021   CO2 27 10/29/2014 1021   CO2 27 09/02/2014 1007   BUN 9 10/29/2014 1021   BUN 10.7 09/02/2014 1007   CREATININE 0.72 10/29/2014 1021   CREATININE 0.8 09/02/2014 1007      Component Value Date/Time   CALCIUM 9.2 10/29/2014 1021   CALCIUM 10.1 09/02/2014 1007   ALKPHOS 67 10/29/2014 1021   AST 19 10/29/2014 1021   ALT 10* 10/29/2014 1021   BILITOT 0.7 10/29/2014 1021       ASSESSMENT AND PLAN:  T2N2cMx HPV negative moderately differentiated Stage IVA Squamous cell carcinoma, base of tongue Mucositis Nausea Weight loss Anxiety Dehydration  She is dehydrated and we bolused her IVF. BP improved after and she feels better.  I have encouraged her to bolus fluids via her PEG several times daily as  well as continuing her TF's. I have urged her to come in for fluids when needed.  Her pain is reasonably controlled. Her anxiety is certainly better.  We will continue with weekly follow-up.She has one cycle of cisplatin left prior to completing her therapy.  Continue with treatment as planned.  All questions were answered. The patient knows to call the clinic with any problems, questions or concerns. We can certainly see the patient much sooner if necessary.  This document serves as a record of services personally performed by Ancil Linsey, MD. It was created on her behalf by Pearlie Oyster, a trained medical scribe. The creation of this record is based on the scribe's personal observations and the provider's statements to them. This document has been checked and approved by the attending provider.    I have reviewed the above documentation for accuracy and completeness, and I agree with the above.  Kelby Fam. Janeya Deyo MD

## 2014-11-10 ENCOUNTER — Ambulatory Visit (HOSPITAL_COMMUNITY): Payer: Self-pay | Admitting: Speech Pathology

## 2014-11-10 ENCOUNTER — Other Ambulatory Visit (HOSPITAL_COMMUNITY): Payer: Self-pay | Admitting: Oncology

## 2014-11-10 ENCOUNTER — Ambulatory Visit (HOSPITAL_COMMUNITY): Payer: Self-pay

## 2014-11-10 DIAGNOSIS — C01 Malignant neoplasm of base of tongue: Secondary | ICD-10-CM

## 2014-11-10 MED ORDER — SODIUM CHLORIDE 0.9 % IV SOLN: INTRAVENOUS | Status: DC

## 2014-11-11 ENCOUNTER — Encounter (HOSPITAL_COMMUNITY): Payer: Self-pay

## 2014-11-11 ENCOUNTER — Encounter (HOSPITAL_BASED_OUTPATIENT_CLINIC_OR_DEPARTMENT_OTHER): Payer: 59

## 2014-11-11 DIAGNOSIS — C01 Malignant neoplasm of base of tongue: Secondary | ICD-10-CM

## 2014-11-11 DIAGNOSIS — E860 Dehydration: Secondary | ICD-10-CM

## 2014-11-11 MED ORDER — SODIUM CHLORIDE 0.9 % IV SOLN
INTRAVENOUS | Status: DC
  Administered 2014-11-11: 14:00:00 via INTRAVENOUS

## 2014-11-11 MED ORDER — HEPARIN SOD (PORK) LOCK FLUSH 100 UNIT/ML IV SOLN
INTRAVENOUS | Status: AC
Start: 2014-11-11 — End: 2014-11-11
  Filled 2014-11-11: qty 5

## 2014-11-11 MED ORDER — HEPARIN SOD (PORK) LOCK FLUSH 100 UNIT/ML IV SOLN
500.0000 [IU] | Freq: Once | INTRAVENOUS | Status: AC
  Administered 2014-11-11: 500 [IU] via INTRAVENOUS

## 2014-11-11 NOTE — Patient Instructions (Signed)
[place] at [facility]  Discharge Instructions:  IV fluids today Follow up as scheduled Call the clinic if you have any questions or concerns _______________________________________________________________  Thank you for choosing [place] at [facility] to provide your oncology and hematology care.  To afford each patient quality time with our providers, please arrive at least 15 minutes before your scheduled appointment.  You need to re-schedule your appointment if you arrive 10 or more minutes late.  We strive to give you quality time with our providers, and arriving late affects you and other patients whose appointments are after yours.  Also, if you no show three or more times for appointments you may be dismissed from the clinic.  Again, thank you for choosing [place] at [place] hope is that these requests will allow you access to exceptional care and in a timely manner. _______________________________________________________________  If you have questions after your visit, please contact our office at [phone] between the hours of 8:30 a.m. and 5:00 p.m. Voicemails left after 4:30 p.m. will not be returned until the following business day. _______________________________________________________________  For prescription refill requests, have your pharmacy contact our office. _______________________________________________________________  Recommendations made by the consultant and any test results will be sent to your referring physician. _______________________________________________________________

## 2014-11-11 NOTE — Progress Notes (Signed)
Shelby Larson Tolerated IV fluids today discharged ambulatory

## 2014-11-12 ENCOUNTER — Ambulatory Visit (HOSPITAL_COMMUNITY): Payer: Self-pay | Admitting: Physical Therapy

## 2014-11-12 NOTE — Assessment & Plan Note (Addendum)
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  Undergoing concurrent chemoradiation with Cisplatin.  She is approaching her last chemotherapy cycle which is next week.  She has needed support with IV fluids as is typical in this situation.   She reports that she has not been able to keep anything down of late and she felt much improved with her last IV fluids appointment.  I will give her another L of NS today.  She is not nauseated at this time because "I took compazine before I came here."  Thus Compazine must be effective for her.  I have made recommendations regarding taking antiemetics prior to anything else to prevent nausea/vomiting with intake.   Return in 1 week for follow-up and completion of systemic chemotherapy.

## 2014-11-12 NOTE — Progress Notes (Signed)
[name], MD [place] [address]  Malignant neoplasm of base of tongue  CURRENT THERAPY: Concurrent chemoradiation with cisplatin  INTERVAL HISTORY: Shelby Larson 49 y.o. 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    Malignant neoplasm of base of tongue   08/20/2014 Surgery Diagnosis 1. Tongue, biopsy, left base/ pharynx mass - INVASIVE MODERATELY DIFFERENTIATED SQUAMOUS CELL CARCINOMA. - SEE COMMENT. 2. Lymph nodes, radical neck dissection, Left - THREE OF SIXTEEN LYMPH NODES POSITIVE FOR METASTATIC SQUAMOUS CELL [facility]   09/18/2014 Surgery Multiple extraction of tooth numbers 2, 14, 15, 18, and 31.  4 Quadrants of alveoloplasty.  Gross debridement of remaining dentition. Mandibular left lingual torus reduction   10/08/2014 -  Chemotherapy Cisplatin every 21 days   10/09/2014 -  Radiation Therapy Finishing on 5/27    I personally reviewed and went over laboratory results with the patient.  The results are noted within this dictation.  She reports that she is having a difficult time keeping things down.  She notes that she takes her medications after they are crushed in applesauce, but she vomits, she reports, and is unable to determine how much she has kept down.  I have recommended taking Compazine 1 hour prior to anything else to see if that will abort this issue.  If ineffective, I recommended taking SL ativan 30 min to 1 hour prior to anything else.  Hopefully either of these interventions will be beneficial.  Otherwise, she notes that she is doing okay.  Past Medical History  Diagnosis Date  . Heart murmur     as a child  . Chronic bronchitis   . Anxiety     panic attacks  . Depression   . Headache     onset a few months  ago  . Neuropathy     had it in both hands  . Arthritis   . Fibromyalgia   . 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 Anemia   . Rosacea   . Hypercholesterolemia   . Asthma   . COPD (chronic obstructive pulmonary disease)   . Seizures     takes Gabapentin  (last one in 2010)    has Malignant neoplasm of pharynx; Malignant neoplasm of base of tongue; Recurrent cold sores; Therapeutic opioid induced constipation; and Chronic periodontitis on her problem list.     is allergic to bee venom; penicillins; and latex.  Shelby Larson had no medications administered during this visit.  Past Surgical History  Procedure Laterality Date  . Wisdom tooth extraction    . Tonsillectomy Left 08/20/2014    Procedure: TONSILLECTOMY;  Surgeon: Ruby Cola, MD;  Location: [facility];  Service: ENT;  Laterality: Left;  . Radical neck dissection Left 08/20/2014    Procedure: RADICAL LEFT NECK DISSECTION ;  Surgeon: Ruby Cola, MD;  Location: [place];  Service: ENT;  Laterality: Left;  . Tracheostomy tube placement N/A 08/20/2014    Procedure: TRACHEOSTOMY - AWAKE;  Surgeon: Ruby Cola, MD;  Location: [place];  Service: ENT;  Laterality: N/A;  . Gastrostomy tube placement    . Multiple extractions with alveoloplasty N/A 09/17/2014    Procedure: Extraction of tooth #'s [phone] with alveoloplasty, mandibular  left lingual torus reduction, and gross debridement of remaining teeth.;  Surgeon: Lenn Cal, DDS;  Location: [place];  Service: Oral Surgery;  Laterality: N/A;    Denies any headaches, dizziness, double vision, fevers, chills, night sweats, nausea, vomiting, diarrhea, constipation, chest pain, heart palpitations, shortness of breath, blood in stool, black tarry stool, urinary pain, urinary burning, urinary frequency, hematuria.   PHYSICAL EXAMINATION  ECOG PERFORMANCE STATUS: 1 - Symptomatic but completely ambulatory  Filed Vitals:   11/13/14 1053    BP: 108/67  Pulse: 95  Temp: 98.1 F (36.7 C)  Resp: 18    GENERAL:alert, no distress, well nourished, well developed, comfortable, cooperative and smiling SKIN: skin color, texture, turgor are normal, no rashes or significant lesions HEAD: Normocephalic, No masses, lesions, tenderness or abnormalities EYES: normal, PERRLA, EOMI, Conjunctiva are pink and non-injected EARS: External ears normal OROPHARYNX:lips, buccal mucosa, and tongue normal and mucous membranes are moist  NECK: supple, trachea midline, tracheostomy noted, erythematous. LYMPH:  no palpable lymphadenopathy BREAST:not examined LUNGS: clear to auscultation and percussion, referred bronchiole rhonchi HEART: regular rate & rhythm ABDOMEN:non-tender BACK: Back symmetric, no curvature. EXTREMITIES:less then 2 second capillary refill, no skin discoloration  NEURO: alert & oriented x 3 with fluent speech, gait normal   LABORATORY DATA: CBC    Component Value Date/Time   WBC 2.7* 10/29/2014 1021   WBC 11.3* 09/02/2014 1007   RBC 3.38* 10/29/2014 1021   RBC 3.76 09/02/2014 1007   HGB 9.6* 10/29/2014 1021   HGB 11.1* 09/02/2014 1007   HCT 28.8* 10/29/2014 1021   HCT 34.8 09/02/2014 1007   PLT 208 10/29/2014 1021   PLT 396 09/02/2014 1007   MCV 85.2 10/29/2014 1021   MCV 92.6 09/02/2014 1007   MCH 28.4 10/29/2014 1021   MCH 29.5 09/02/2014 1007   MCHC 33.3 10/29/2014 1021   MCHC 31.9 09/02/2014 1007   RDW 15.5 10/29/2014 1021   RDW 15.4* 09/02/2014 1007   LYMPHSABS 0.8 10/29/2014 1021   LYMPHSABS 1.5 09/02/2014 1007   MONOABS 0.7 10/29/2014 1021   MONOABS 1.1* 09/02/2014 1007   EOSABS 0.1 10/29/2014 1021   EOSABS 0.2 09/02/2014 1007   BASOSABS 0.0 10/29/2014 1021   BASOSABS 0.0 09/02/2014 1007      Chemistry      Component Value Date/Time   NA 135 10/29/2014 1021   NA 137 09/02/2014 1007   K 3.2* 10/29/2014 1021   K 4.5 09/02/2014 1007   CL 97* 10/29/2014 1021   CO2 27 10/29/2014 1021   CO2 27  09/02/2014 1007   BUN 9 10/29/2014 1021   BUN 10.7 09/02/2014 1007   CREATININE 0.72 10/29/2014 1021   CREATININE 0.8 09/02/2014 1007      Component Value Date/Time   CALCIUM 9.2 10/29/2014 1021   CALCIUM 10.1 09/02/2014 1007   ALKPHOS 67 10/29/2014 1021   AST 19 10/29/2014 1021   ALT 10* 10/29/2014 1021   BILITOT 0.7 10/29/2014 1021        ASSESSMENT AND PLAN:  Malignant neoplasm of base of tongue Stage IVA, HPV-, 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  Undergoing concurrent chemoradiation with Cisplatin.  She is approaching her last chemotherapy cycle which is next week.  She has needed support with IV fluids as is typical in this situation.   She reports that she has not been  able to keep anything down of late and she felt much improved with her last IV fluids appointment.  I will give her another L of NS today.  She is not nauseated at this time because "I took compazine before I came here."  Thus Compazine must be effective for her.  I have made recommendations regarding taking antiemetics prior to anything else to prevent nausea/vomiting with intake.   Return in 1 week for follow-up and completion of systemic chemotherapy.    THERAPY PLAN:  Continue with treatment as planned.  All questions were answered. The patient knows to call the clinic with any problems, questions or concerns. We can certainly see the patient much sooner if necessary.  Patient and plan discussed with Dr. Ancil Linsey and she is in agreement with the aforementioned.   This note is electronically signed by: Robynn Pane 11/13/2014 11:58 AM

## 2014-11-13 ENCOUNTER — Encounter (HOSPITAL_BASED_OUTPATIENT_CLINIC_OR_DEPARTMENT_OTHER): Payer: 59

## 2014-11-13 ENCOUNTER — Encounter (HOSPITAL_BASED_OUTPATIENT_CLINIC_OR_DEPARTMENT_OTHER): Payer: 59 | Admitting: Oncology

## 2014-11-13 ENCOUNTER — Encounter: Payer: Self-pay | Admitting: Dietician

## 2014-11-13 VITALS — BP 108/67 | HR 95 | Temp 98.1°F | Resp 18 | Wt 107.5 lb

## 2014-11-13 DIAGNOSIS — C01 Malignant neoplasm of base of tongue: Secondary | ICD-10-CM

## 2014-11-13 MED ORDER — SODIUM CHLORIDE 0.9 % IJ SOLN
10.0000 mL | INTRAMUSCULAR | Status: DC | PRN
  Administered 2014-11-13: 10 mL via INTRAVENOUS
  Filled 2014-11-13: qty 10

## 2014-11-13 MED ORDER — HEPARIN SOD (PORK) LOCK FLUSH 100 UNIT/ML IV SOLN
500.0000 [IU] | Freq: Once | INTRAVENOUS | Status: AC
  Administered 2014-11-13: 500 [IU] via INTRAVENOUS

## 2014-11-13 MED ORDER — SODIUM CHLORIDE 0.9 % IV SOLN
Freq: Once | INTRAVENOUS | Status: AC
  Administered 2014-11-13: 13:00:00 via INTRAVENOUS

## 2014-11-13 NOTE — Patient Instructions (Signed)
..  [place] at [facility] Discharge Instructions  RECOMMENDATIONS MADE BY THE CONSULTANT AND ANY TEST RESULTS WILL BE SENT TO YOUR REFERRING PHYSICIAN.  IV fluids today  Take compazine 1 hr prior to anything else If ineffective take ativan/lorazepam sub-lingual 30 min to 1 hour prior to anything else  Return 5/26 for last chemo and your radiation will finish on 5/27  Thank you for choosing [place] at [facility] to provide your oncology and hematology care.  To afford each patient quality time with our provider, please arrive at least 15 minutes before your scheduled appointment time.    You need to re-schedule your appointment should you arrive 10 or more minutes late.  We strive to give you quality time with our providers, and arriving late affects you and other patients whose appointments are after yours.  Also, if you no show three or more times for appointments you may be dismissed from the clinic at the providers discretion.     Again, thank you for choosing [facility].  Our hope is that these requests will decrease the amount of time that you wait before being seen by our physicians.       _____________________________________________________________  Should you have questions after your visit to [facility], please contact our office at [phone] between the hours of 8:30 a.m. and 4:30 p.m.  Voicemails left after 4:30 p.m. will not be returned until the following business day.  For prescription refill requests, have your pharmacy contact our office.

## 2014-11-13 NOTE — Progress Notes (Signed)
Received Call from [facility] who asked to get orders to change pt to pump feeding instead of bolus d/t limited tolerance  Wt Readings from Last 10 Encounters:  11/13/14 107 lb 8 oz (48.762 kg)  11/06/14 111 lb 8 oz (50.576 kg)  10/29/14 114 lb 3.2 oz (51.801 kg)  10/22/14 113 lb 11.2 oz (51.574 kg)  10/14/14 118 lb 4.8 oz (53.661 kg)  10/08/14 123 lb 9.6 oz (56.065 kg)  09/17/14 121 lb (54.885 kg)  09/14/14 122 lb 9.6 oz (55.611 kg)  09/02/14 122 lb 8 oz (55.566 kg)  08/28/14 136 lb 11 oz (62 kg)  Pt continues to lose weight  Pt has been unable to get the full 7 cans I recommended. She experiences n/v/bloating. No reported diarrhea   New regimen: Osmolite 1.2, goal rate of 60ml/hr for 15 hrs via pump.  Flush with 124ml 5 times per day.    This will provide 1710 kcal, 79 g protein and 2022ml fluids per day.     Will continue to monitor weight and tolerance and modify regimen accordingly  Burtis Junes RD, LDN Nutrition Pager: [phone] 11/13/2014 2:57 PM

## 2014-11-17 ENCOUNTER — Other Ambulatory Visit (HOSPITAL_COMMUNITY): Payer: Self-pay | Admitting: *Deleted

## 2014-11-17 ENCOUNTER — Encounter (HOSPITAL_COMMUNITY): Payer: Self-pay | Admitting: *Deleted

## 2014-11-17 ENCOUNTER — Ambulatory Visit (HOSPITAL_COMMUNITY): Payer: Self-pay | Admitting: Physical Therapy

## 2014-11-17 DIAGNOSIS — C140 Malignant neoplasm of pharynx, unspecified: Secondary | ICD-10-CM

## 2014-11-17 DIAGNOSIS — C01 Malignant neoplasm of base of tongue: Secondary | ICD-10-CM

## 2014-11-17 NOTE — Progress Notes (Unsigned)
Mardene Celeste Huttner's Speech Therapy's orders with [facility] were cancelled today. I sent an order over to [facility] via fax as well as left a voicemail with [place] Patient Care Manager @ the [facility] branch that we needed to cancel Speech therapy. I am awaiting a call back from [facility] to verify that she received the message. I also sent an order for speech therapy to [facility] department and left a voicemail with [facility] stating that this patient needed to be changed to an urgent status. I asked that they call me back to verify that they received the message. I am awaiting a phone call back on that as well.

## 2014-11-19 ENCOUNTER — Ambulatory Visit (HOSPITAL_COMMUNITY): Payer: 59 | Attending: Oncology | Admitting: Speech Pathology

## 2014-11-19 ENCOUNTER — Encounter (HOSPITAL_BASED_OUTPATIENT_CLINIC_OR_DEPARTMENT_OTHER): Payer: 59 | Admitting: Hematology & Oncology

## 2014-11-19 ENCOUNTER — Encounter (HOSPITAL_COMMUNITY): Payer: Self-pay | Admitting: Hematology & Oncology

## 2014-11-19 ENCOUNTER — Encounter (HOSPITAL_BASED_OUTPATIENT_CLINIC_OR_DEPARTMENT_OTHER): Payer: 59

## 2014-11-19 VITALS — BP 104/71 | HR 85 | Temp 98.0°F | Resp 18 | Wt 104.8 lb

## 2014-11-19 DIAGNOSIS — F419 Anxiety disorder, unspecified: Secondary | ICD-10-CM

## 2014-11-19 DIAGNOSIS — R1314 Dysphagia, pharyngoesophageal phase: Secondary | ICD-10-CM | POA: Diagnosis present

## 2014-11-19 DIAGNOSIS — C01 Malignant neoplasm of base of tongue: Secondary | ICD-10-CM | POA: Diagnosis not present

## 2014-11-19 DIAGNOSIS — R490 Dysphonia: Secondary | ICD-10-CM

## 2014-11-19 DIAGNOSIS — R634 Abnormal weight loss: Secondary | ICD-10-CM | POA: Diagnosis not present

## 2014-11-19 DIAGNOSIS — R110 Nausea: Secondary | ICD-10-CM | POA: Diagnosis not present

## 2014-11-19 DIAGNOSIS — K1230 Oral mucositis (ulcerative), unspecified: Secondary | ICD-10-CM | POA: Diagnosis not present

## 2014-11-19 DIAGNOSIS — Z5111 Encounter for antineoplastic chemotherapy: Secondary | ICD-10-CM | POA: Diagnosis not present

## 2014-11-19 DIAGNOSIS — E860 Dehydration: Secondary | ICD-10-CM

## 2014-11-19 LAB — COMPREHENSIVE METABOLIC PANEL
ALK PHOS: 76 U/L (ref 38–126)
ALT: 7 U/L — ABNORMAL LOW (ref 14–54)
ANION GAP: 11 (ref 5–15)
AST: 15 U/L (ref 15–41)
Albumin: 3.1 g/dL — ABNORMAL LOW (ref 3.5–5.0)
BUN: 17 mg/dL (ref 6–20)
CO2: 26 mmol/L (ref 22–32)
Calcium: 8.9 mg/dL (ref 8.9–10.3)
Chloride: 98 mmol/L — ABNORMAL LOW (ref 101–111)
Creatinine, Ser: 1.77 mg/dL — ABNORMAL HIGH (ref 0.44–1.00)
GFR, EST AFRICAN AMERICAN: 38 mL/min — AB (ref >60)
GFR, EST NON AFRICAN AMERICAN: 33 mL/min — AB (ref >60)
GLUCOSE: 99 mg/dL (ref 65–99)
Potassium: 4.1 mmol/L (ref 3.5–5.1)
Sodium: 135 mmol/L (ref 135–145)
TOTAL PROTEIN: 6.4 g/dL — AB (ref 6.5–8.1)
Total Bilirubin: 0.6 mg/dL (ref 0.3–1.2)

## 2014-11-19 LAB — CBC WITH DIFFERENTIAL/PLATELET
Basophils Absolute: 0 10*3/uL (ref 0.0–0.1)
Basophils Relative: 0 % (ref 0–1)
EOS ABS: 0.1 10*3/uL (ref 0.0–0.7)
Eosinophils Relative: 1 % (ref 0–5)
HCT: 27.6 % — ABNORMAL LOW (ref 36.0–46.0)
HEMOGLOBIN: 8.9 g/dL — AB (ref 12.0–15.0)
LYMPHS ABS: 0.4 10*3/uL — AB (ref 0.7–4.0)
LYMPHS PCT: 4 % — AB (ref 12–46)
MCH: 28.1 pg (ref 26.0–34.0)
MCHC: 32.2 g/dL (ref 30.0–36.0)
MCV: 87.1 fL (ref 78.0–100.0)
MONO ABS: 0.7 10*3/uL (ref 0.1–1.0)
MONOS PCT: 9 % (ref 3–12)
NEUTROS PCT: 86 % — AB (ref 43–77)
Neutro Abs: 7.1 10*3/uL (ref 1.7–7.7)
Platelets: 358 10*3/uL (ref 150–400)
RBC: 3.17 MIL/uL — ABNORMAL LOW (ref 3.87–5.11)
RDW: 16.7 % — AB (ref 11.5–15.5)
WBC: 8.2 10*3/uL (ref 4.0–10.5)

## 2014-11-19 MED ORDER — SODIUM CHLORIDE 0.9 % IV SOLN
Freq: Once | INTRAVENOUS | Status: AC
  Administered 2014-11-19: 13:00:00 via INTRAVENOUS
  Filled 2014-11-19: qty 5

## 2014-11-19 MED ORDER — HEPARIN SOD (PORK) LOCK FLUSH 100 UNIT/ML IV SOLN
500.0000 [IU] | Freq: Once | INTRAVENOUS | Status: AC | PRN
  Administered 2014-11-19: 500 [IU]
  Filled 2014-11-19 (×2): qty 5

## 2014-11-19 MED ORDER — SODIUM CHLORIDE 0.9 % IV SOLN
149.0000 mg | Freq: Once | INTRAVENOUS | Status: AC
  Administered 2014-11-19: 149 mg via INTRAVENOUS
  Filled 2014-11-19: qty 149

## 2014-11-19 MED ORDER — SODIUM CHLORIDE 0.9 % IJ SOLN
10.0000 mL | INTRAMUSCULAR | Status: DC | PRN
  Administered 2014-11-19: 10 mL
  Filled 2014-11-19 (×2): qty 10

## 2014-11-19 MED ORDER — SERTRALINE HCL 100 MG PO TABS: 100.0000 mg | ORAL_TABLET | Freq: Every day | ORAL | Status: DC

## 2014-11-19 MED ORDER — FENTANYL 75 MCG/HR TD PT72: 75.0000 ug | MEDICATED_PATCH | TRANSDERMAL | Status: DC

## 2014-11-19 MED ORDER — PANTOPRAZOLE SODIUM 40 MG PO TBEC: 40.0000 mg | DELAYED_RELEASE_TABLET | Freq: Every day | ORAL | Status: DC

## 2014-11-19 MED ORDER — POTASSIUM CHLORIDE 2 MEQ/ML IV SOLN
Freq: Once | INTRAVENOUS | Status: AC
  Administered 2014-11-19: 10:00:00 via INTRAVENOUS
  Filled 2014-11-19: qty 10

## 2014-11-19 MED ORDER — HYDROCODONE-ACETAMINOPHEN 10-325 MG PO TABS: 1.0000 | ORAL_TABLET | Freq: Four times a day (QID) | ORAL | Status: DC | PRN

## 2014-11-19 MED ORDER — QUETIAPINE FUMARATE 50 MG PO TABS: 50.0000 mg | ORAL_TABLET | Freq: Every day | ORAL | Status: DC | PRN

## 2014-11-19 MED ORDER — SODIUM CHLORIDE 0.9 % IV SOLN
Freq: Once | INTRAVENOUS | Status: AC
  Administered 2014-11-19: 500 mL via INTRAVENOUS

## 2014-11-19 MED ORDER — PALONOSETRON HCL INJECTION 0.25 MG/5ML
0.2500 mg | Freq: Once | INTRAVENOUS | Status: AC
  Administered 2014-11-19: 0.25 mg via INTRAVENOUS
  Filled 2014-11-19: qty 5

## 2014-11-19 NOTE — Patient Instructions (Signed)
[facility] Discharge Instructions for Patients Receiving Chemotherapy  Today you received the following chemotherapy agent:  Cisplatin Today you received 1 liter of fluid plus an additional 500 ml of fluid via your port. Push fluids at home to help flush your kidneys.   If you develop nausea and vomiting, or diarrhea that is not controlled by your medication, call the clinic.  The clinic phone number is [phone]. Office hours are Monday-Friday 8:30am-5:00pm.  BELOW ARE SYMPTOMS THAT SHOULD BE REPORTED IMMEDIATELY:  *FEVER GREATER THAN 101.0 F  *CHILLS WITH OR WITHOUT FEVER  NAUSEA AND VOMITING THAT IS NOT CONTROLLED WITH YOUR NAUSEA MEDICATION  *UNUSUAL SHORTNESS OF BREATH  *UNUSUAL BRUISING OR BLEEDING  TENDERNESS IN MOUTH AND THROAT WITH OR WITHOUT PRESENCE OF ULCERS  *URINARY PROBLEMS  *BOWEL PROBLEMS  UNUSUAL RASH Items with * indicate a potential emergency and should be followed up as soon as possible. If you have an emergency after office hours please contact your primary care physician or go to the nearest emergency department.  Please call the clinic during office hours if you have any questions or concerns.   You may also contact the Patient Navigator at [phone] should you have any questions or need assistance in obtaining follow up care. _____________________________________________________________________ Have you asked about our STAR program?    STAR stands for Survivorship Training and Rehabilitation, and this is a nationally recognized cancer care program that focuses on survivorship and rehabilitation.  Cancer and cancer treatments may cause problems, such as, pain, making you feel tired and keeping you from doing the things that you need or want to do. Cancer rehabilitation can help. Our goal is to reduce these troubling effects and help you have the best quality of life possible.  You may receive a survey from a nurse that asks  questions about your current state of health.  Based on the survey results, all eligible patients will be referred to the [facility] program for an evaluation so we can better serve you! A frequently asked questions sheet is available upon request.

## 2014-11-19 NOTE — Therapy (Signed)
[place], [place], [address] Phone: [phone]   Fax:  [phone]  Speech Language Pathology Evaluation  Patient Details  Name: Shelby Larson MRN: [id number] Date of Birth: 01-01-1966 Referring Provider: [name] PCP: [name], MD  Encounter Date: 11/19/2014      End of Session - 11/19/14 1620    Visit Number 1   Number of Visits 8   Date for SLP Re-Evaluation 01/19/15   Authorization Type [employer]   SLP Start Time 1115   SLP Stop Time  1238   SLP Time Calculation (min) 83 min   Activity Tolerance Patient limited by pain      Past Medical History  Diagnosis Date  . Heart murmur     as a child  . Chronic bronchitis   . Anxiety     panic attacks  . Depression   . Headache     onset a few months ago  . Neuropathy     had it in both hands  . Arthritis   . Fibromyalgia   . 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 Anemia   . Rosacea   . Hypercholesterolemia   . Asthma   . COPD (chronic obstructive pulmonary disease)   . Seizures     takes Gabapentin  (last one in 2010)    Past Surgical History  Procedure Laterality Date  . Wisdom tooth extraction    . Tonsillectomy Left 08/20/2014    Procedure: TONSILLECTOMY;  Surgeon: Ruby Cola, MD;  Location: [facility] OR;  Service: ENT;  Laterality: Left;  . Radical neck dissection Left 08/20/2014    Procedure: RADICAL LEFT NECK DISSECTION ;  Surgeon: Ruby Cola, MD;  Location: [place];  Service: ENT;  Laterality: Left;  . Tracheostomy tube placement N/A 08/20/2014    Procedure: TRACHEOSTOMY - AWAKE;  Surgeon: Ruby Cola, MD;  Location: [place];  Service: ENT;  Laterality: N/A;  . Gastrostomy tube placement    . Multiple extractions with alveoloplasty N/A 09/17/2014    Procedure: Extraction of tooth #'s [phone] with alveoloplasty, mandibular left lingual torus reduction, and gross debridement of  remaining teeth.;  Surgeon: Lenn Cal, DDS;  Location: [place];  Service: Oral Surgery;  Laterality: N/A;    There were no vitals filed for this visit.  Visit Diagnosis: Dysphagia        SLP Education - 11/19/14 1615    Education provided Yes   Education Details use of PMSV, diet recommendations, aspiration precautions   Person(s) Educated Patient   Methods Explanation;Demonstration;Handout   Comprehension Verbalized understanding;Need further instruction          SLP Short Term Goals - 11/19/14 1624    SLP SHORT TERM GOAL #1   Title Complete MBSS   SLP SHORT TERM GOAL #2   Title Pt will consume soft diet and thin liquids with use of strategies as needed.          SLP Long Term Goals - 11/19/14 1622    SLP LONG TERM GOAL #1   Title Pt will demonstrate safe and efficient consumption of highest recommended diet with use of strategies.   Baseline only tolerating small sips thin liquid and jello   Time 8   Period Weeks   Status New   SLP LONG TERM GOAL #2   Title Pt will be independent with pharyngeal and laryngeal exercises with  use of written cue.   Baseline max assist   Time 8   Period Weeks   Status New          Plan - 11/19/14 1621    Clinical Impression Statement Ms. [name] was seen for dysphagia evaluation during her chemo treatment. Her primary source of nutrition at this time is via PEG (however appears clogged somewhat with tomato juice). She is only sipping on liquids and eating small amounts of jello at home without PMSV. PMSV not present for today's evaluation so pt assessed without given that she is not using at home. Pt with wet, productive cough via trach and inner cannula appears tinged red, worrisome for aspiration of at least secretions if not jello itself (pt eats red jello). Pt reports significant pain with swallow. Hyolaryngeal excursion reduced and negatively impacted by tethering of trach. Pt only took a small amount po today and  coughing/throat clearing persisted throughout the evaluation. Pt states that she does not wear her PMSV because she coughs it off. SLP explained importance of wearing PMSV and its positive impact on increasing pressure for swallow, generating a strong cough. Recommend MBSS to objectively evaluate swallow due to signs of aspiration. Pt asked to bring PMSV to appointment to assess for tolerance while donning for po intake and voicing. Pt agreeable to plan. SLP will follow pt for MBSS and additional appointments for dysphagia therapy and education related to the sequelae of radiation therapy on swallow function.   Speech Therapy Frequency 1x /week   Duration --  8 weeks   Treatment/Interventions Aspiration precaution training;Pharyngeal strengthening exercises;Diet toleration management by SLP;Compensatory techniques;Trials of upgraded texture/liquids;Cueing hierarchy;SLP instruction and feedback;Patient/family education  MBSS   Potential to Achieve Goals Fair   Potential Considerations Pain level   SLP Home Exercise Plan Pt will complete HEP as assigned to faciliate carryover of treatment strategies in home environment   Consulted and Agree with Plan of Care Patient        Problem List Patient Active Problem List   Diagnosis Date Noted  . 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 Therapeutic opioid induced constipation 09/16/2014  . 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 Malignant neoplasm of base of tongue 09/02/2014  . Malignant neoplasm of pharynx 08/20/2014     Oral/Motor/Sensory Function Overall Oral Motor/Sensory Function: Impaired Labial ROM: Within Functional Limits Labial Symmetry: Within Functional Limits Labial Strength: Reduced (weaker on right) Labial Sensation: Within Functional Limits Lingual ROM: Reduced right;Reduced left Lingual Symmetry: Within Functional Limits Lingual Strength: Reduced Facial ROM: Within Functional Limits Facial Symmetry: Within Functional Limits Facial  Strength: Within Functional Limits Facial Sensation: Within Functional Limits Mandible: Impaired   Ice Chips Ice chips: Within functional limits Presentation: Spoon   Thin Liquid Thin Liquid: Within functional limits Presentation: Cup;Straw Other Comments: facial grimmace with each swallow    Nectar Thick Nectar Thick Liquid: Within functional limits Presentation: Spoon Other Comments: facial grimmace   Honey Thick Honey Thick Liquid: Not tested   Puree Puree: Within functional limits Presentation: Spoon Other Comments: painful, only took a small amount   Solid   Thank you,  Genene Churn, CCC-SLP [phone]     Solid: Not tested       PORTER,DABNEY 11/19/2014,4:27 PM

## 2014-11-19 NOTE — Progress Notes (Signed)
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   Intermittent nausea and vomiting. She says her radiation was cancelled this morning due to their machine being down. Her neck is red and painful. She says it is painful to swallow and is having trouble sleeping due to the discomfort. She likes the oxycodone and the hydrocodone together, says "they are her saving grace". It doesn't help if she only takes one alone. She needs to have her feeding tube replaced. She can't get anything through it other than thin liquids. [name] had called [place] as she felt the FT was occluded.     Malignant neoplasm of base of tongue   08/20/2014 Surgery Diagnosis 1. Tongue, biopsy, left base/ pharynx mass - INVASIVE MODERATELY DIFFERENTIATED SQUAMOUS CELL CARCINOMA. - SEE COMMENT. 2. Lymph nodes, radical neck dissection, Left - THREE OF SIXTEEN LYMPH NODES POSITIVE FOR METASTATIC SQUAMOUS CELL [facility]   09/18/2014 Surgery Multiple extraction of tooth numbers 2, 14, 15, 18, and 31.  4 Quadrants of alveoloplasty.  Gross debridement of remaining dentition. Mandibular left lingual torus reduction   10/08/2014 -  Chemotherapy Cisplatin every 21 days   10/09/2014 -  Radiation Therapy Finishing on 5/27     Past Medical History  Diagnosis Date  . Heart murmur     as a child  . Chronic bronchitis   . Anxiety     panic attacks  . Depression   . Headache     onset a few months ago  . Neuropathy     had it in both hands  . Arthritis   . Fibromyalgia   . 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 Anemia   . Rosacea   . Hypercholesterolemia   . Asthma   . COPD (chronic obstructive pulmonary disease)   . Seizures     takes Gabapentin  (last one in 2010)    has Malignant neoplasm of pharynx; Malignant neoplasm of base of tongue; Recurrent cold sores; Therapeutic opioid induced constipation; and Chronic periodontitis on her problem list.     is allergic to bee venom; penicillins; and latex.  Ms. Sharps had no medications administered during this visit.  Past Surgical History  Procedure Laterality Date  . Wisdom tooth extraction    . Tonsillectomy Left 08/20/2014    Procedure: TONSILLECTOMY;  Surgeon: Ruby Cola, MD;  Location: [facility] OR;  Service: ENT;  Laterality: Left;  . Radical neck dissection Left 08/20/2014    Procedure: RADICAL LEFT NECK DISSECTION ;  Surgeon: Ruby Cola, MD;  Location: [place];  Service: ENT;  Laterality: Left;  . Tracheostomy tube placement N/A 08/20/2014  Procedure: TRACHEOSTOMY - AWAKE;  Surgeon: Ruby Cola, MD;  Location: [place];  Service: ENT;  Laterality: N/A;  . Gastrostomy tube placement    . Multiple extractions with alveoloplasty N/A 09/17/2014    Procedure: Extraction of tooth #'s [phone] with alveoloplasty, mandibular left lingual torus reduction, and gross debridement of remaining teeth.;  Surgeon: Lenn Cal, DDS;  Location: [place];  Service: Oral Surgery;  Laterality: N/A;    Pt reports congestion. Pt reports nausea and vomiting from radiation related secretions. 14 point review of  systems is performed and is negative except as detailed in history of present illness   PHYSICAL EXAMINATION ECOG PERFORMANCE STATUS: 1 - Symptomatic but completely ambulatory  There were no vitals filed for this visit.  GENERAL:alert, no distress, well nourished, well developed, comfortable, in chemotherapy chair SKIN: skin color, texture, turgor are normal, no rashes or significant lesions. Bilateral neck, anterior neck erythema HEAD: Normocephalic, No masses, lesions, tenderness or abnormalities EYES: normal, PERRLA, EOMI, Conjunctiva are pink and non-injected EARS: External ears normal OROPHARYNX:lips, buccal mucosa, and tongue normal and mucous membranes are moist Thick secretions noted, erythema from XRT noted.  NECK: supple, trachea midline, tracheostomy noted, yellow to clear secretions bilateral skin erythema LYMPH:  no palpable lymphadenopathy BREAST:not examined LUNGS: clear to auscultation and percussion, rare rhonchi HEART: regular rhythm, tachycardic ABDOMEN:non-tender BACK: Back symmetric, no curvature. EXTREMITIES:less then 2 second capillary refill, no skin discoloration  NEURO: alert & oriented x 3 with fluent speech, gait normal   LABORATORY DATA: CBC    Component Value Date/Time   WBC 8.2 11/19/2014 1005   WBC 11.3* 09/02/2014 1007   RBC 3.17* 11/19/2014 1005   RBC 3.76 09/02/2014 1007   HGB 8.9* 11/19/2014 1005   HGB 11.1* 09/02/2014 1007   HCT 27.6* 11/19/2014 1005   HCT 34.8 09/02/2014 1007   PLT 358 11/19/2014 1005   PLT 396 09/02/2014 1007   MCV 87.1 11/19/2014 1005   MCV 92.6 09/02/2014 1007   MCH 28.1 11/19/2014 1005   MCH 29.5 09/02/2014 1007   MCHC 32.2 11/19/2014 1005   MCHC 31.9 09/02/2014 1007   RDW 16.7* 11/19/2014 1005   RDW 15.4* 09/02/2014 1007   LYMPHSABS 0.4* 11/19/2014 1005   LYMPHSABS 1.5 09/02/2014 1007   MONOABS 0.7 11/19/2014 1005   MONOABS 1.1* 09/02/2014 1007   EOSABS 0.1 11/19/2014 1005   EOSABS 0.2 09/02/2014 1007    BASOSABS 0.0 11/19/2014 1005   BASOSABS 0.0 09/02/2014 1007      Chemistry      Component Value Date/Time   NA 135 11/19/2014 1005   NA 137 09/02/2014 1007   K 4.1 11/19/2014 1005   K 4.5 09/02/2014 1007   CL 98* 11/19/2014 1005   CO2 26 11/19/2014 1005   CO2 27 09/02/2014 1007   BUN 17 11/19/2014 1005   BUN 10.7 09/02/2014 1007   CREATININE 1.77* 11/19/2014 1005   CREATININE 0.8 09/02/2014 1007      Component Value Date/Time   CALCIUM 8.9 11/19/2014 1005   CALCIUM 10.1 09/02/2014 1007   ALKPHOS 76 11/19/2014 1005   AST 15 11/19/2014 1005   ALT 7* 11/19/2014 1005   BILITOT 0.6 11/19/2014 1005       ASSESSMENT AND PLAN:  T2N2cMx HPV negative 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 Realistically, other than her weight loss she  has done better than anticipated. She had a lot of anxiety issues at the beginning of treatment. Her anxiety is certainly better  Her pain is reasonably controlled. She has been taking hydrocodone and oxycodone together. She insists that neither one works alone. I advised her that I will not write both together. I have increased her fentanyl patch and provided her with hydrocodone for breakthrough pain.  We will continue with weekly follow-up  Continue with treatment as planned.   She will undergo a feeding tube change tomorrow in IR.   I provided her with multiple refills.  Her Hgb today is 8.9 she is asymptomatic. We will recheck a CBC next week and consider transfusion pending symptoms and her if her Hgb is less than or equal to 8.5.   All questions were answered. The patient knows to call the clinic with any problems, questions or concerns. We can certainly see the patient much sooner if necessary.  This document serves as a record of services personally performed by Ancil Linsey, MD.  It was created on her behalf by Pearlie Oyster, a trained medical scribe. The creation of this record is based on the scribe's personal observations and the provider's statements to them. This document has been checked and approved by the attending provider.    This document serves as a record of services personally performed by Ancil Linsey, MD. It was created on her behalf by Arlyce Harman, a trained medical scribe. The creation of this record is based on the scribe's personal observations and the provider's statements to them. This document has been checked and approved by the attending provider.  I have reviewed the above documentation for accuracy and completeness, and I agree with the above.  Kelby Fam. [place] MD

## 2014-11-20 ENCOUNTER — Other Ambulatory Visit (HOSPITAL_COMMUNITY): Payer: Self-pay | Admitting: Hematology & Oncology

## 2014-11-20 ENCOUNTER — Other Ambulatory Visit (HOSPITAL_COMMUNITY): Payer: Self-pay | Admitting: Oncology

## 2014-11-20 ENCOUNTER — Encounter (HOSPITAL_BASED_OUTPATIENT_CLINIC_OR_DEPARTMENT_OTHER): Payer: 59

## 2014-11-20 ENCOUNTER — Ambulatory Visit (HOSPITAL_COMMUNITY): Payer: Self-pay

## 2014-11-20 ENCOUNTER — Ambulatory Visit (HOSPITAL_COMMUNITY)
Admission: RE | Admit: 2014-11-20 | Discharge: 2014-11-20 | Disposition: A | Discharge status: Home / Self Care | Payer: 59 | Source: Ambulatory Visit | Attending: Oncology | Admitting: Oncology

## 2014-11-20 VITALS — BP 126/90 | HR 79 | Temp 98.0°F | Resp 18

## 2014-11-20 DIAGNOSIS — C140 Malignant neoplasm of pharynx, unspecified: Secondary | ICD-10-CM

## 2014-11-20 DIAGNOSIS — C01 Malignant neoplasm of base of tongue: Secondary | ICD-10-CM | POA: Diagnosis present

## 2014-11-20 DIAGNOSIS — Z5189 Encounter for other specified aftercare: Secondary | ICD-10-CM

## 2014-11-20 DIAGNOSIS — R1310 Dysphagia, unspecified: Secondary | ICD-10-CM

## 2014-11-20 MED ORDER — FENTANYL 75 MCG/HR TD PT72: 75.0000 ug | MEDICATED_PATCH | TRANSDERMAL | Status: DC

## 2014-11-20 MED ORDER — PANTOPRAZOLE SODIUM 40 MG PO TBEC: 40.0000 mg | DELAYED_RELEASE_TABLET | Freq: Every day | ORAL | Status: DC

## 2014-11-20 MED ORDER — LORAZEPAM 1 MG PO TABS: ORAL_TABLET | ORAL | Status: DC

## 2014-11-20 MED ORDER — PEGFILGRASTIM INJECTION 6 MG/0.6ML ~~LOC~~
PREFILLED_SYRINGE | SUBCUTANEOUS | Status: AC
  Filled 2014-11-20: qty 0.6

## 2014-11-20 MED ORDER — PROCHLORPERAZINE MALEATE 10 MG PO TABS: 10.0000 mg | ORAL_TABLET | Freq: Four times a day (QID) | ORAL | Status: DC | PRN

## 2014-11-20 MED ORDER — HYDROCODONE-ACETAMINOPHEN 10-325 MG PO TABS: 1.0000 | ORAL_TABLET | Freq: Four times a day (QID) | ORAL | Status: DC | PRN

## 2014-11-20 MED ORDER — PEGFILGRASTIM INJECTION 6 MG/0.6ML ~~LOC~~
6.0000 mg | PREFILLED_SYRINGE | Freq: Once | SUBCUTANEOUS | Status: AC
  Administered 2014-11-20: 6 mg via SUBCUTANEOUS

## 2014-11-20 MED ORDER — QUETIAPINE FUMARATE 50 MG PO TABS: 50.0000 mg | ORAL_TABLET | Freq: Every day | ORAL | Status: DC | PRN

## 2014-11-20 NOTE — Progress Notes (Signed)
..  Shelby Larson presents today for injection per the provider's orders.  neulasta administration without incident; see MAR for injection details.  Patient tolerated procedure well and without incident.  No questions or complaints noted at this time. Patient states that she lost prescriptions yesterday. [place] and they do not have anything called in except protonix on 5/26. Dr. Whitney Muse notified and prescriptions reprinted.

## 2014-11-22 ENCOUNTER — Other Ambulatory Visit (HOSPITAL_COMMUNITY): Payer: Self-pay | Admitting: Hematology & Oncology

## 2014-11-24 ENCOUNTER — Ambulatory Visit (HOSPITAL_COMMUNITY)
Admission: RE | Admit: 2014-11-24 | Discharge: 2014-11-24 | Disposition: A | Discharge status: Home / Self Care | Payer: 59 | Source: Ambulatory Visit | Attending: Hematology & Oncology | Admitting: Hematology & Oncology

## 2014-11-24 ENCOUNTER — Ambulatory Visit (HOSPITAL_COMMUNITY): Payer: 59 | Admitting: Speech Pathology

## 2014-11-24 ENCOUNTER — Ambulatory Visit (HOSPITAL_COMMUNITY): Payer: Self-pay | Admitting: Physical Therapy

## 2014-11-24 DIAGNOSIS — R1314 Dysphagia, pharyngoesophageal phase: Secondary | ICD-10-CM | POA: Diagnosis not present

## 2014-11-24 DIAGNOSIS — R1310 Dysphagia, unspecified: Secondary | ICD-10-CM | POA: Diagnosis not present

## 2014-11-25 ENCOUNTER — Encounter (HOSPITAL_COMMUNITY): Payer: Self-pay | Admitting: Hematology & Oncology

## 2014-11-25 ENCOUNTER — Encounter (HOSPITAL_COMMUNITY): Payer: 59 | Attending: Hematology & Oncology | Admitting: Hematology & Oncology

## 2014-11-25 VITALS — BP 113/79 | HR 93 | Temp 98.5°F | Resp 20 | Wt 104.2 lb

## 2014-11-25 DIAGNOSIS — R634 Abnormal weight loss: Secondary | ICD-10-CM | POA: Diagnosis not present

## 2014-11-25 DIAGNOSIS — G893 Neoplasm related pain (acute) (chronic): Secondary | ICD-10-CM | POA: Diagnosis not present

## 2014-11-25 DIAGNOSIS — Z87891 Personal history of nicotine dependence: Secondary | ICD-10-CM | POA: Insufficient documentation

## 2014-11-25 DIAGNOSIS — K1230 Oral mucositis (ulcerative), unspecified: Secondary | ICD-10-CM

## 2014-11-25 DIAGNOSIS — R110 Nausea: Secondary | ICD-10-CM

## 2014-11-25 DIAGNOSIS — C01 Malignant neoplasm of base of tongue: Secondary | ICD-10-CM | POA: Insufficient documentation

## 2014-11-25 DIAGNOSIS — E860 Dehydration: Secondary | ICD-10-CM

## 2014-11-25 DIAGNOSIS — C760 Malignant neoplasm of head, face and neck: Secondary | ICD-10-CM

## 2014-11-25 MED ORDER — FENTANYL 100 MCG/HR TD PT72: 100.0000 ug | MEDICATED_PATCH | TRANSDERMAL | Status: DC

## 2014-11-25 NOTE — Progress Notes (Signed)
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   She has 3 more radiation treatments left. Her long term hope is to have her trach out. She sees her ENT surgeon again towards the end of the month. Her mood is "pretty crappy right now" due to how to she is feeling but she is still optimistic.  She will be traveling to [place] soon to visit family.  She is able to swallow jello. She has difficulty swallowing water. She is  attending speech therapy. She is only able to tolerate 5 cans of Ensure via her PEG.   Her pain is still a 10/10 most of the time. She doesn't feel as though the increase in the patch helped. She is still staying up at night due to the secretions and pain.     Malignant neoplasm of base of tongue   08/20/2014 Surgery Diagnosis 1. Tongue, biopsy, left base/ pharynx mass - INVASIVE MODERATELY DIFFERENTIATED SQUAMOUS CELL CARCINOMA. - SEE COMMENT. 2. Lymph nodes, radical neck dissection, Left - THREE OF SIXTEEN LYMPH NODES POSITIVE FOR METASTATIC SQUAMOUS CELL [facility]   09/18/2014 Surgery Multiple extraction of tooth numbers 2, 14, 15, 18, and 31.  4  Quadrants of alveoloplasty.  Gross debridement of remaining dentition. Mandibular left lingual torus reduction   10/08/2014 -  Chemotherapy Cisplatin every 21 days   10/09/2014 -  Radiation Therapy Finishing on 5/27     Past Medical History  Diagnosis Date  . Heart murmur     as a child  . Chronic bronchitis   . Anxiety     panic attacks  . Depression   . Headache     onset a few months ago  . Neuropathy     had it in both hands  . Arthritis   . Fibromyalgia   . 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 Anemia   . Rosacea   . Hypercholesterolemia   . Asthma   . COPD (chronic obstructive pulmonary disease)   . Seizures     takes Gabapentin  (last one in 2010)    has Malignant neoplasm of pharynx; Malignant neoplasm of base of tongue; Recurrent cold sores; Therapeutic opioid induced constipation; and Chronic periodontitis on her problem list.     is allergic to bee venom; penicillins; and latex.  Ms. Waltermire had no medications administered during this visit.  Past Surgical History  Procedure Laterality Date  . Wisdom tooth extraction    . Tonsillectomy Left 08/20/2014    Procedure: TONSILLECTOMY;  Surgeon: Ruby Cola, MD;  Location: [facility] OR;  Service: ENT;  Laterality: Left;  . Radical neck dissection Left  08/20/2014    Procedure: RADICAL LEFT NECK DISSECTION ;  Surgeon: Ruby Cola, MD;  Location: [place];  Service: ENT;  Laterality: Left;  . Tracheostomy tube placement N/A 08/20/2014    Procedure: TRACHEOSTOMY - AWAKE;  Surgeon: Ruby Cola, MD;  Location: [place];  Service: ENT;  Laterality: N/A;  . Gastrostomy tube placement    . Multiple extractions with alveoloplasty N/A 09/17/2014    Procedure: Extraction of tooth #'s [phone] with alveoloplasty, mandibular left lingual torus reduction, and gross debridement of remaining teeth.;  Surgeon: Lenn Cal, DDS;  Location: [place];  Service: Oral Surgery;  Laterality: N/A;     Pt reports congestion. Pt reports nausea and vomiting from radiation related secretions. 14 point review of systems is performed and is negative except as detailed in history of present illness   PHYSICAL EXAMINATION  ECOG PERFORMANCE STATUS: 1 - Symptomatic but completely ambulatory  Filed Vitals:   11/25/14 0906  BP: 113/79  Pulse: 93  Temp: 98.5 F (36.9 C)  Resp: 20    GENERAL:alert, no distress, well nourished, well developed, comfortable, in chemotherapy chair SKIN: skin color, texture, turgor are normal, no rashes or significant lesions. Bilateral neck, anterior neck erythema HEAD: Normocephalic, No masses, lesions, tenderness or abnormalities MOUTH:  Thick secretions. No desquamation. EYES: normal, PERRLA, EOMI, Conjunctiva are pink and non-injected EARS: External ears normal OROPHARYNX:lips, buccal mucosa, and tongue normal and mucous membranes are moist Thick secretions noted, erythema from XRT noted.  NECK: supple, trachea midline, tracheostomy noted, yellow to clear secretions bilateral skin erythema Skin changes from radiation. LYMPH:  no palpable lymphadenopathy BREAST:not examined LUNGS: clear to auscultation and percussion, rare rhonchi HEART: regular rhythm, tachycardic ABDOMEN:non-tender BACK: Back symmetric, no curvature. EXTREMITIES:less then 2 second capillary refill, no skin discoloration  NEURO: alert & oriented x 3 with fluent speech, gait normal (she is hoarse)   LABORATORY DATA: CBC    Component Value Date/Time   WBC 8.2 11/19/2014 1005   WBC 11.3* 09/02/2014 1007   RBC 3.17* 11/19/2014 1005   RBC 3.76 09/02/2014 1007   HGB 8.9* 11/19/2014 1005   HGB 11.1* 09/02/2014 1007   HCT 27.6* 11/19/2014 1005   HCT 34.8 09/02/2014 1007   PLT 358 11/19/2014 1005   PLT 396 09/02/2014 1007   MCV 87.1 11/19/2014 1005   MCV 92.6 09/02/2014 1007   MCH 28.1 11/19/2014 1005   MCH 29.5 09/02/2014 1007   MCHC 32.2 11/19/2014 1005   MCHC 31.9  09/02/2014 1007   RDW 16.7* 11/19/2014 1005   RDW 15.4* 09/02/2014 1007   LYMPHSABS 0.4* 11/19/2014 1005   LYMPHSABS 1.5 09/02/2014 1007   MONOABS 0.7 11/19/2014 1005   MONOABS 1.1* 09/02/2014 1007   EOSABS 0.1 11/19/2014 1005   EOSABS 0.2 09/02/2014 1007   BASOSABS 0.0 11/19/2014 1005   BASOSABS 0.0 09/02/2014 1007      Chemistry      Component Value Date/Time   NA 135 11/19/2014 1005   NA 137 09/02/2014 1007   K 4.1 11/19/2014 1005   K 4.5 09/02/2014 1007   CL 98* 11/19/2014 1005   CO2 26 11/19/2014 1005   CO2 27 09/02/2014 1007   BUN 17 11/19/2014 1005   BUN 10.7 09/02/2014 1007   CREATININE 1.77* 11/19/2014 1005   CREATININE 0.8 09/02/2014 1007      Component Value Date/Time   CALCIUM 8.9 11/19/2014 1005   CALCIUM 10.1 09/02/2014 1007   ALKPHOS 76 11/19/2014 1005   AST 15 11/19/2014  1005   ALT 7* 11/19/2014 1005   BILITOT 0.6 11/19/2014 1005       ASSESSMENT AND PLAN:  T2N2cMx 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 She has been working with speech therapy. She is able to swallow Jell-O, she states swallowing water is difficult as taste is an issue currently.  Her pain continues to be an issue. At times it seems to be reasonably controlled but she states at night it is an 8-10 out of 10. I increased her fentanyl patch a week ago and she would like one additional increase. We will move her fentanyl patch up to 100 g.   She does not want blood work today. I ordered it at her next follow-up with [place] in 2 weeks. Advised to increase her calorie intake as best as possible. Starting weight was in the 120's, weight today is stable at 104 lbs.   We will discuss ongoing surveillance at her next follow-up.  All questions were answered. The patient knows to call the  clinic with any problems, questions or concerns. We can certainly see the patient much sooner if necessary.  This document serves as a record of services personally performed by Ancil Linsey, MD. It was created on her behalf by Arlyce Harman, a trained medical scribe. The creation of this record is based on the scribe's personal observations and the provider's statements to them. This document has been checked and approved by the attending provider.  I have reviewed the above documentation for accuracy and completeness, and I agree with the above.  Kelby Fam. Rossanna Spitzley MD

## 2014-11-25 NOTE — Patient Instructions (Signed)
[place] at [facility] Discharge Instructions  RECOMMENDATIONS MADE BY THE CONSULTANT AND ANY TEST RESULTS WILL BE SENT TO YOUR REFERRING PHYSICIAN.  Follow up with [name] the PA in two weeks. Call for concerns or questions.  Thank you for choosing [place] at [facility] to provide your oncology and hematology care.  To afford each patient quality time with our provider, please arrive at least 15 minutes before your scheduled appointment time.    You need to re-schedule your appointment should you arrive 10 or more minutes late.  We strive to give you quality time with our providers, and arriving late affects you and other patients whose appointments are after yours.  Also, if you no show three or more times for appointments you may be dismissed from the clinic at the providers discretion.     Again, thank you for choosing [facility].  Our hope is that these requests will decrease the amount of time that you wait before being seen by our physicians.       _____________________________________________________________  Should you have questions after your visit to [facility], please contact our office at [phone] between the hours of 8:30 a.m. and 4:30 p.m.  Voicemails left after 4:30 p.m. will not be returned until the following business day.  For prescription refill requests, have your pharmacy contact our office.

## 2014-11-26 ENCOUNTER — Ambulatory Visit (HOSPITAL_COMMUNITY): Payer: Self-pay | Admitting: Physical Therapy

## 2014-11-26 MED ORDER — FENTANYL 100 MCG/HR TD PT72: 100.0000 ug | MEDICATED_PATCH | TRANSDERMAL | Status: DC

## 2014-11-26 NOTE — Addendum Note (Signed)
Addended by: Joie Bimler on: 11/26/2014 10:57 AM   Modules accepted: Orders

## 2014-11-27 ENCOUNTER — Encounter: Payer: Self-pay | Admitting: Dietician

## 2014-11-27 NOTE — Progress Notes (Signed)
Contacted Pt to follow up in light of reported intolerance to full TF regimen and continued weight loss  Wt Readings from Last 10 Encounters:  11/25/14 104 lb 3.2 oz (47.265 kg)  11/19/14 104 lb 12.8 oz (47.537 kg)  11/13/14 107 lb 8 oz (48.762 kg)  11/06/14 111 lb 8 oz (50.576 kg)  10/29/14 114 lb 3.2 oz (51.801 kg)  10/22/14 113 lb 11.2 oz (51.574 kg)  10/14/14 118 lb 4.8 oz (53.661 kg)  10/08/14 123 lb 9.6 oz (56.065 kg)  09/17/14 121 lb (54.885 kg)  09/14/14 122 lb 9.6 oz (55.611 kg)  Patient weight has decreased by another 3 lbs since we changed her TF regimen to pump based feedings.   Patient reports that her pump has not been working for a while now. She states she has meant to contact advanced care about it, but has been very busy. She has been doing 5 cans of Osmolite 1.2 as bolus. She is suffering from symptoms including a strong sense of fullness when she does more.   When I asked if she could do the 6 cans initially reccommended she said "If i have too". Instead, I asked if she would like to use a more concentrated formula, such as the osmolite 1.5. She reports that she has used this in the past and actually had hoped she would be using that for her every day regimen.   Contacted [facility] regarding the broken TF pump and to put in orders to change her regimen.   New TF regimen: 5 can Osmolite 1.5 through pump. Rate of 80 ml/hr x 15 hrs. Flush with 180 mls 5x a day   Will provide:  1778 kcals 75 g Pro 905 mls free water (+900 mls from flushes)  She will use up her remaining Osmolite 1.2 because her insurance does not cover her TF formula. Will continue to Monitor Weight/ reported tolerance  Burtis Junes RD, LDN Nutrition Pager: [phone] 11/27/2014 10:43 AM

## 2014-11-29 NOTE — Therapy (Signed)
[place], [place], [address] Phone: [phone]   Fax:  [phone]  Modified Barium Swallow  Patient Details  Name: Shelby Larson MRN: [id number] Date of Birth: [birth year] Referring Provider:  Patrici Ranks, MD  Encounter Date: 11/24/2014      End of Session - 11/24/14 1505    Visit Number 2   Number of Visits 8   Date for SLP Re-Evaluation 01/19/15   Authorization Type UHC   SLP Start Time 6962   SLP Stop Time  1336   SLP Time Calculation (min) 31 min   Activity Tolerance Patient tolerated treatment well      Past Medical History  Diagnosis Date  . Heart murmur     as a child  . Chronic bronchitis   . Anxiety     panic attacks  . Depression   . Headache     onset a few months ago  . Neuropathy     had it in both hands  . Arthritis   . Fibromyalgia   . 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 Anemia   . Rosacea   . Hypercholesterolemia   . Asthma   . COPD (chronic obstructive pulmonary disease)   . Seizures     takes Gabapentin  (last one in 2010)    Past Surgical History  Procedure Laterality Date  . Wisdom tooth extraction    . Tonsillectomy Left 08/20/2014    Procedure: TONSILLECTOMY;  Surgeon: Shelby Cola, MD;  Location: [facility] OR;  Service: ENT;  Laterality: Left;  . Radical neck dissection Left 08/20/2014    Procedure: RADICAL LEFT NECK DISSECTION ;  Surgeon: Shelby Cola, MD;  Location: [place];  Service: ENT;  Laterality: Left;  . Tracheostomy tube placement N/A 08/20/2014    Procedure: TRACHEOSTOMY - AWAKE;  Surgeon: Shelby Cola, MD;  Location: [place];  Service: ENT;  Laterality: N/A;  . Gastrostomy tube placement    . Multiple extractions with alveoloplasty N/A 09/17/2014    Procedure: Extraction of tooth #'s [phone] with alveoloplasty, mandibular left lingual torus reduction, and gross debridement of remaining teeth.;   Surgeon: Shelby Larson, DDS;  Location: [place];  Service: Oral Surgery;  Laterality: N/A;    There were no vitals filed for this visit.  Visit Diagnosis: Dysphagia, pharyngoesophageal phase           General - 11/24/14 1417    General Information   Date of Onset 08/11/14   Other Pertinent Information Shelby Larson 49 y.o. 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   Type of Study Other (Comment)  MBSS   Reason for Referral Objectively evaluate swallowing function   Previous Swallow Assessment MBSS 2/27 and 08/27/2014 D3/thin with PMSV in place   Diet Prior to this Study PEG tube   Temperature Spikes Noted No   Respiratory Status Room air   Trach Size and Type Uncuffed;#6;With PMSV in place   History of Recent Intubation No   Behavior/Cognition Alert;Cooperative;Pleasant mood   Oral Cavity - Dentition Missing dentition   Oral Motor / Sensory Function Impaired   Self-Feeding Abilities Able to feed self   Patient Positioning Upright in chair/Tumbleform   Baseline Vocal  Quality Normal;Hoarse  PMSV in place   Volitional Cough Strong;Congested   Volitional Swallow Able to elicit   Anatomy Other (Comment)  presence of trach   Pharyngeal Secretions Not observed secondary MBS            Oral Preparation/Oral Phase - 11/24/14 1423    Oral Preparation/Oral Phase   Oral Phase Impaired  reduced lingual movement   Electrical stimulation - Oral Phase   Was Electrical Stimulation Used No          Pharyngeal Phase - 11/24/14 1450    Pharyngeal Phase   Pharyngeal Phase Impaired   Pharyngeal - Nectar   Pharyngeal- Nectar Cup Swallow initiation at vallecula;Reduced laryngeal elevation;Reduced airway/laryngeal closure;Reduced anterior laryngeal  mobility;Penetration/Aspiration before swallow;Penetration/Aspiration during swallow;Pharyngeal residue - valleculae   Pharyngeal - Thin   Pharyngeal- Thin Cup Swallow initiation at vallecula;Reduced anterior laryngeal mobility;Reduced laryngeal elevation;Reduced airway/laryngeal closure;Penetration/Aspiration before swallow;Penetration/Aspiration during swallow;Pharyngeal residue - valleculae   Pharyngeal- Thin Straw Swallow initiation at vallecula;Reduced anterior laryngeal mobility;Reduced laryngeal elevation;Reduced airway/laryngeal closure;Penetration/Aspiration before swallow;Penetration/Aspiration during swallow;Pharyngeal residue - valleculae;Inter-arytenoid space residue;Trace aspiration   Pharyngeal - Solids   Pharyngeal- Puree Swallow initiation at vallecula   Pharyngeal- Mechanical Soft Swallow initiation at vallecula   Electrical Stimulation - Pharyngeal Phase   Was Electrical Stimulation Used No          Cricopharyngeal Phase - 11/24/14 1503    Cervical Esophageal Phase   Cervical Esophageal Phase Within functional limits            SLP Long Term Goals - 11/24/14 1507    SLP LONG TERM GOAL #1   Title Pt will demonstrate safe and efficient consumption of highest recommended diet with use of strategies.   Baseline only tolerating small sips thin liquid and jello   Time 8   Period Weeks   Status New   SLP LONG TERM GOAL #2   Title Pt will be independent with pharyngeal and laryngeal exercises with use of written cue.   Baseline max assist   Time 8   Period Weeks   Status New          Plan - 11/24/14 1506    Clinical Impression Statement [name] was seen for MBSS. Her primary source of nutrition at this time is via PEG. She is only sipping on liquids and eating small amounts of jello at home without PMSV. Today, she was donning the PMSV and reports that she has been wearing it more since I stressed the importance of it last visit.  Objective study revealed  moderate pharyngeal phase dysphagia characterized by premature spillage with thin and NTL with swallow trigger after spilling to pyrirforms, decreased hyolaryngeal excursion and epiglottic deflection, and decreased vocal fold closure resulting in penetration before and during the swallow with NTL (underepiglottic coating with mild residuals) and deep penetration to the level of the vocal folds with cup sips thin- pt was able to feel this and spontaneously and quickly coughed and cleared from laryngeal vestibule. Pt aspirated when drinking thin liquids from the straw and did produce a spontaneous cough but was unable to remove aspirate from posterior tracheal wall; Pt also assessed without PMSV and experienced trace aspiration of thins with decreased sensation and inability to spontaneously clear. Trials of puree and mech soft without issue or residuals. Mild retention of barium noted in esophagus with esophageal sweep.  Recommend: Dysphagia 3/mech soft with thin liquids with PMSV in place; take small sips and clear throat after each  swallow; repeat with dry swallow. Pt may wish to try NTL if clearing throat becomes too cumbersome. SLP to follow in outpatient setting.     Speech Therapy Frequency 1x /week   Duration --  8 weeks   Treatment/Interventions Aspiration precaution training;Pharyngeal strengthening exercises;Diet toleration management by SLP;Compensatory techniques;Trials of upgraded texture/liquids;Cueing hierarchy;SLP instruction and feedback;Patient/family education  MBSS   Potential to Achieve Goals Fair   Potential Considerations Pain level   SLP Home Exercise Plan Pt will complete HEP as assigned to faciliate carryover of treatment strategies in home environment   Consulted and Agree with Plan of Care Patient            Recommendations/Treatment - 11/24/14 1503    Swallow Evaluation Recommendations   SLP Diet Recommendations Dysphagia 3 (Mech soft);Thin; Pt must wear PMSV    Liquid Administration via Cup;No straw   Medication Administration Via alternative means   Supervision Patient able to self feed   Compensations Multiple dry swallows after each bite/sip;Clear throat intermittently;Hard cough after swallow   Postural Changes Seated upright at 90 degrees          Prognosis - 11/24/14 1504    Prognosis   Prognosis for Safe Diet Advancement Fair   Barriers/Prognosis Comment late effects of radiation therapy   Individuals Consulted   Consulted and Agree with Results and Recommendations Patient   Report Sent to  Referring physician      Problem List Patient Active Problem List   Diagnosis Date Noted  . 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 Therapeutic opioid induced constipation 09/16/2014  . 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 Malignant neoplasm of base of tongue 09/02/2014  . 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

## 2014-12-01 ENCOUNTER — Ambulatory Visit (HOSPITAL_COMMUNITY): Payer: Self-pay | Admitting: Physical Therapy

## 2014-12-03 ENCOUNTER — Ambulatory Visit (HOSPITAL_COMMUNITY): Payer: Self-pay | Admitting: Physical Therapy

## 2014-12-08 ENCOUNTER — Ambulatory Visit (HOSPITAL_COMMUNITY): Payer: Self-pay | Admitting: Physical Therapy

## 2014-12-10 ENCOUNTER — Encounter: Payer: Self-pay | Admitting: Dietician

## 2014-12-10 ENCOUNTER — Ambulatory Visit (HOSPITAL_COMMUNITY): Payer: Self-pay | Admitting: Physical Therapy

## 2014-12-10 NOTE — Progress Notes (Addendum)
Was contacted by SLT in regards to patients Tube feeding regimen.  Pt reports some reflux of the TF. Therapist worried pt may be aspirating her feeds.  Contacted Pt by Phone  Wt Readings from Last 10 Encounters:  11/25/14 104 lb 3.2 oz (47.265 kg)  11/19/14 104 lb 12.8 oz (47.537 kg)  11/13/14 107 lb 8 oz (48.762 kg)  11/06/14 111 lb 8 oz (50.576 kg)  10/29/14 114 lb 3.2 oz (51.801 kg)  10/22/14 113 lb 11.2 oz (51.574 kg)  10/14/14 118 lb 4.8 oz (53.661 kg)  10/08/14 123 lb 9.6 oz (56.065 kg)  10/02/14 121 lb (54.885 kg)  09/18/14 121 lb (54.885 kg)   Asked Pt if she has weighed herself recently. She said she has and said it was 98 lbs. This would be a 6 lb loss in 6 months.   Prescribed TF regimen.  Pt reports having a lot of trouble with her TF. She initially said she did a rate of 190 ml/hr for 10-12 hrs; the equivalent of 8 cans. I told her this and she said "well i must be wrong then". She admits she "just pushes buttons until it works". She also states that she has been doing a bolus each morning as well.   The only oral intake she has is jello and she is still suffering from symptoms including lack of taste and trouble swallowing.   Her prescribed regimen 5 can Osmolite 1.5 through pump. Rate of 80 ml/hr x 15 hrs. Flush with 180 mls 5x a day   Would provide:  1778 kcals 75 g Pro 905 mls free water (+900 mls from flushes)  Discussed with pt that she should be sleeping at a 30 degree angle when administering TF. She says she sleeps sitting almost all the time anyway. I also told her to try a rate of 120 mls/hr. Is she is infuses for 10 hrs, this would give her her recommend intake of 5 cans.  Pt seemed to not really be following any structured routine. I contacted [facility] to see if they ever taught her how to use the pump.    The person I talked to said that they have had a lot of trouble getting in touch with her. She said it looks like the workers have talked to her mother in  the past, but not her specifically?   Apparently she just got her new pump 2 days ago because she was gone. It sounds like she had not received training on how to use it.   My guess is her weight loss is due her infusing less because of her broken pump and then she also wasn't infusing her full regimen during her recent trip to [place].    The RD with whom I spoke said she would call her and try to talk her through using the pump.    Burtis Junes RD, LDN Nutrition Pager: [phone] 12/10/2014 2:57 PM

## 2014-12-15 ENCOUNTER — Ambulatory Visit (HOSPITAL_COMMUNITY): Payer: Self-pay | Admitting: Oncology

## 2014-12-15 ENCOUNTER — Ambulatory Visit (HOSPITAL_COMMUNITY): Payer: Self-pay | Admitting: Physical Therapy

## 2014-12-16 ENCOUNTER — Encounter (HOSPITAL_COMMUNITY): Payer: Self-pay | Admitting: Oncology

## 2014-12-17 ENCOUNTER — Telehealth (HOSPITAL_COMMUNITY): Payer: Self-pay | Admitting: Hematology & Oncology

## 2014-12-17 ENCOUNTER — Ambulatory Visit (HOSPITAL_COMMUNITY): Payer: Self-pay | Admitting: Physical Therapy

## 2014-12-17 NOTE — Telephone Encounter (Signed)
PC TO [employer] TO GET FAX NUMBER TO APPEAL DENIAL FOR OSMOLITE FORMULA . I WAS INFORMED  BY TONYA CSR THAT NOT MATTER WHAT THE FORMULA WILL NOT BE APPROVED BECAUSE IT IS EXCLUDED FROM THE PLAN. NO EXCEPTIONS! CALL REF# [id number]/23/2016  Angela [employer] [facility] [phone]

## 2014-12-21 ENCOUNTER — Telehealth (HOSPITAL_COMMUNITY): Payer: Self-pay | Admitting: Oncology

## 2014-12-21 ENCOUNTER — Encounter (HOSPITAL_BASED_OUTPATIENT_CLINIC_OR_DEPARTMENT_OTHER): Payer: 59 | Admitting: Oncology

## 2014-12-21 VITALS — BP 121/69 | HR 91 | Temp 99.0°F | Resp 18 | Wt 109.6 lb

## 2014-12-21 DIAGNOSIS — D649 Anemia, unspecified: Secondary | ICD-10-CM

## 2014-12-21 DIAGNOSIS — C01 Malignant neoplasm of base of tongue: Secondary | ICD-10-CM | POA: Diagnosis not present

## 2014-12-21 DIAGNOSIS — Z95828 Presence of other vascular implants and grafts: Secondary | ICD-10-CM

## 2014-12-21 DIAGNOSIS — Z87891 Personal history of nicotine dependence: Secondary | ICD-10-CM | POA: Diagnosis not present

## 2014-12-21 LAB — CBC WITH DIFFERENTIAL/PLATELET
BASOS ABS: 0 10*3/uL (ref 0.0–0.1)
Basophils Relative: 0 % (ref 0–1)
Eosinophils Absolute: 0 10*3/uL (ref 0.0–0.7)
Eosinophils Relative: 0 % (ref 0–5)
HCT: 21.9 % — ABNORMAL LOW (ref 36.0–46.0)
HEMOGLOBIN: 7.3 g/dL — AB (ref 12.0–15.0)
Lymphocytes Relative: 7 % — ABNORMAL LOW (ref 12–46)
Lymphs Abs: 0.5 10*3/uL — ABNORMAL LOW (ref 0.7–4.0)
MCH: 30 pg (ref 26.0–34.0)
MCHC: 33.3 g/dL (ref 30.0–36.0)
MCV: 90.1 fL (ref 78.0–100.0)
Monocytes Absolute: 0.5 10*3/uL (ref 0.1–1.0)
Monocytes Relative: 7 % (ref 3–12)
NEUTROS ABS: 6.1 10*3/uL (ref 1.7–7.7)
Neutrophils Relative %: 86 % — ABNORMAL HIGH (ref 43–77)
Platelets: 214 10*3/uL (ref 150–400)
RBC: 2.43 MIL/uL — AB (ref 3.87–5.11)
RDW: 20 % — AB (ref 11.5–15.5)
WBC: 7.2 10*3/uL (ref 4.0–10.5)

## 2014-12-21 LAB — COMPREHENSIVE METABOLIC PANEL
ALK PHOS: 56 U/L (ref 38–126)
ALT: 10 U/L — ABNORMAL LOW (ref 14–54)
AST: 17 U/L (ref 15–41)
Albumin: 2.6 g/dL — ABNORMAL LOW (ref 3.5–5.0)
Anion gap: 7 (ref 5–15)
BILIRUBIN TOTAL: 0.3 mg/dL (ref 0.3–1.2)
BUN: 44 mg/dL — ABNORMAL HIGH (ref 6–20)
CHLORIDE: 95 mmol/L — AB (ref 101–111)
CO2: 30 mmol/L (ref 22–32)
Calcium: 8.5 mg/dL — ABNORMAL LOW (ref 8.9–10.3)
Creatinine, Ser: 2.18 mg/dL — ABNORMAL HIGH (ref 0.44–1.00)
GFR calc Af Amer: 30 mL/min — ABNORMAL LOW (ref >60)
GFR calc non Af Amer: 26 mL/min — ABNORMAL LOW (ref >60)
GLUCOSE: 94 mg/dL (ref 65–99)
Potassium: 4.2 mmol/L (ref 3.5–5.1)
Sodium: 132 mmol/L — ABNORMAL LOW (ref 135–145)
TOTAL PROTEIN: 5.6 g/dL — AB (ref 6.5–8.1)

## 2014-12-21 LAB — PREPARE RBC (CROSSMATCH)

## 2014-12-21 MED ORDER — SODIUM CHLORIDE 0.9 % IV SOLN: INTRAVENOUS | Status: DC

## 2014-12-21 MED ORDER — HYDROCODONE-ACETAMINOPHEN 10-325 MG PO TABS: 1.0000 | ORAL_TABLET | Freq: Four times a day (QID) | ORAL | Status: DC | PRN

## 2014-12-21 MED ORDER — SODIUM CHLORIDE 0.9 % IJ SOLN
10.0000 mL | Freq: Once | INTRAMUSCULAR | Status: AC
  Administered 2014-12-21: 10 mL via INTRAVENOUS

## 2014-12-21 MED ORDER — SODIUM CHLORIDE 0.9 % IV SOLN: Freq: Once | INTRAVENOUS | Status: DC

## 2014-12-21 MED ORDER — HEPARIN SOD (PORK) LOCK FLUSH 100 UNIT/ML IV SOLN
500.0000 [IU] | Freq: Once | INTRAVENOUS | Status: AC
Start: 2014-12-21 — End: 2014-12-21
  Administered 2014-12-21: 500 [IU] via INTRAVENOUS
  Filled 2014-12-21: qty 5

## 2014-12-21 NOTE — Patient Instructions (Signed)
Shelby Larson at [facility] Discharge Instructions  RECOMMENDATIONS MADE BY THE CONSULTANT AND ANY TEST RESULTS WILL BE SENT TO YOUR REFERRING PHYSICIAN.  Port flush and lab work today. Port flush every 6 weeks. PET scan in 9 weeks. MD appointment in 4 weeks. You were given a Gokul Waybright prescription for Hydrocodone today. Return as scheduled.  Thank you for choosing [facility] at [facility] to provide your oncology and hematology care.  To afford each patient quality time with our provider, please arrive at least 15 minutes before your scheduled appointment time.    You need to re-schedule your appointment should you arrive 10 or more minutes late.  We strive to give you quality time with our providers, and arriving late affects you and other patients whose appointments are after yours.  Also, if you no show three or more times for appointments you may be dismissed from the clinic at the providers discretion.     Again, thank you for choosing [facility].  Our hope is that these requests will decrease the amount of time that you wait before being seen by our physicians.       _____________________________________________________________  Should you have questions after your visit to [facility], please contact our office at [phone] between the hours of 8:30 a.m. and 4:30 p.m.  Voicemails left after 4:30 p.m. will not be returned until the following business day.  For prescription refill requests, have your pharmacy contact our office.

## 2014-12-21 NOTE — Progress Notes (Signed)
Shelby Larson presented for Portacath access and flush. Proper placement of portacath confirmed by CXR. Portacath located right chest wall accessed with  H 20 needle. Good blood return present. Portacath flushed with 85ml NS and 500U/39ml Heparin and needle removed intact. Procedure without incident. Patient tolerated procedure well.

## 2014-12-21 NOTE — Progress Notes (Signed)
Shelby Labrum, MD [place] [address]  Malignant neoplasm of base of tongue - Plan: CBC with Differential, Comprehensive metabolic panel, NM PET Image Restag (PS) Skull Base To Thigh, HYDROcodone-acetaminophen (NORCO) 10-325 MG per tablet, CBC with Differential, Comprehensive metabolic panel  Port catheter in place - Plan: sodium chloride 0.9 % injection 10 mL, heparin lock flush 100 unit/mL  CURRENT THERAPY: S/P chemoradiation with cisplatin  INTERVAL HISTORY: Shelby Larson 49 y.o. 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    Malignant neoplasm of base of tongue   08/20/2014 Surgery Diagnosis 1. Tongue, biopsy, left base/ pharynx mass - INVASIVE MODERATELY DIFFERENTIATED SQUAMOUS CELL CARCINOMA. - SEE COMMENT. 2. Lymph nodes, radical neck dissection, Left - THREE OF SIXTEEN LYMPH NODES POSITIVE FOR METASTATIC SQUAMOUS CELL [facility]   09/18/2014 Surgery Multiple extraction of tooth numbers 2, 14, 15, 18, and 31.  4 Quadrants of alveoloplasty.  Gross debridement of remaining dentition. Mandibular left lingual torus reduction   10/08/2014 - 11/19/2014 Chemotherapy Cisplatin every 21 days   10/09/2014 - 11/30/2014 Radiation Therapy [name] with twice daily dosing at the end of treatment at patient's request due to trip to [facility] against medical advice.    I personally reviewed and went over laboratory results with the patient.  The results are noted within this dictation.  She went to the beach and was subsequently admitted to [facility] in [place].  She was found to have a Hgb of 6.8 g/dL and a creatinine of 6.  She was previously advised by Rad Onc to not go on her trips until she recovered from therapy.  She was given twice  daily dosing to complete her radiation treatment.  This is likely the culprit of her acute anemia, given Larson evidence of active bleeding. She was given Procrit in the hospital.  She notes that the nephrologist that saw her in the hospital was concerned that she did not receive enough chemotherapy treatments, so he clearly is not well versed in the the treatment of head and neck malignancy.  Shelby Larson was treated with curative intent and received appropriate treatment for Stage IV head and neck malignancy.    "Let's get a PET scan set-up immediately."  I provided education that we need to wait 12 weeks out from radiation, which was completed on 11/30/2014.Marland Kitchen  "Well then let's do it that day."  Past Medical History  Diagnosis Date  . Heart murmur     as a child  . Chronic bronchitis   . Anxiety     panic attacks  . Depression   . Headache     onset a few months ago  . Neuropathy     had it in both hands  . Arthritis   . Fibromyalgia   . 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 Anemia   . Rosacea   . Hypercholesterolemia   . Asthma   . COPD (chronic obstructive pulmonary disease)   . Seizures     takes Gabapentin  (last one in 2010)    has Malignant neoplasm of pharynx; Malignant neoplasm of base of tongue; Recurrent cold sores; Therapeutic opioid induced constipation; and Chronic periodontitis on her problem list.  is allergic to bee venom; penicillins; and latex.  Shelby Larson had Larson medications administered during this visit.  Past Surgical History  Procedure Laterality Date  . Wisdom tooth extraction    . Tonsillectomy Left 08/20/2014    Procedure: TONSILLECTOMY;  Surgeon: Ruby Cola, MD;  Location: [facility] OR;  Service: ENT;  Laterality: Left;  . Radical neck dissection Left 08/20/2014    Procedure: RADICAL LEFT NECK DISSECTION ;  Surgeon: Ruby Cola, MD;  Location: [place];  Service: ENT;  Laterality: Left;  . Tracheostomy tube placement  N/A 08/20/2014    Procedure: TRACHEOSTOMY - AWAKE;  Surgeon: Ruby Cola, MD;  Location: [place];  Service: ENT;  Laterality: N/A;  . Gastrostomy tube placement    . Multiple extractions with alveoloplasty N/A 09/17/2014    Procedure: Extraction of tooth #'s [id number] with alveoloplasty, mandibular left lingual torus reduction, and gross debridement of remaining teeth.;  Surgeon: Lenn Cal, DDS;  Location: [facility];  Service: Oral Surgery;  Laterality: N/A;    Denies any headaches, dizziness, double vision, fevers, chills, night sweats, nausea, vomiting, diarrhea, constipation, chest pain, heart palpitations, shortness of breath, blood in stool, black tarry stool, urinary pain, urinary burning, urinary frequency, hematuria.   PHYSICAL EXAMINATION  ECOG PERFORMANCE STATUS: 1 - Symptomatic but completely ambulatory  Filed Vitals:   12/21/14 1118  BP: 121/69  Pulse: 91  Temp: 99 F (37.2 C)  Resp: 18    GENERAL:alert, Larson distress, well nourished, well developed, comfortable, cooperative and smiling SKIN: skin color, texture, turgor are normal, Larson rashes or significant lesions HEAD: Normocephalic, Larson masses, lesions, tenderness or abnormalities EYES: normal, PERRLA, EOMI, Conjunctiva are pink and non-injected EARS: External ears normal OROPHARYNX:lips, buccal mucosa, and tongue normal and mucous membranes are moist  NECK: supple, trachea midline, tracheostomy noted, erythematous. LYMPH:  Larson palpable lymphadenopathy BREAST:not examined LUNGS: clear to auscultation and percussion, referred bronchiole rhonchi HEART: regular rate & rhythm ABDOMEN:non-tender BACK: Back symmetric, Larson curvature. EXTREMITIES:less then 2 second capillary refill, Larson skin discoloration  NEURO: alert & oriented x 3 with fluent speech, gait normal   LABORATORY DATA: CBC    Component Value Date/Time   WBC 8.2 11/19/2014 1005   WBC 11.3* 09/02/2014 1007   RBC 3.17* 11/19/2014 1005   RBC 3.76  09/02/2014 1007   HGB 8.9* 11/19/2014 1005   HGB 11.1* 09/02/2014 1007   HCT 27.6* 11/19/2014 1005   HCT 34.8 09/02/2014 1007   PLT 358 11/19/2014 1005   PLT 396 09/02/2014 1007   MCV 87.1 11/19/2014 1005   MCV 92.6 09/02/2014 1007   MCH 28.1 11/19/2014 1005   MCH 29.5 09/02/2014 1007   MCHC 32.2 11/19/2014 1005   MCHC 31.9 09/02/2014 1007   RDW 16.7* 11/19/2014 1005   RDW 15.4* 09/02/2014 1007   LYMPHSABS 0.4* 11/19/2014 1005   LYMPHSABS 1.5 09/02/2014 1007   MONOABS 0.7 11/19/2014 1005   MONOABS 1.1* 09/02/2014 1007   EOSABS 0.1 11/19/2014 1005   EOSABS 0.2 09/02/2014 1007   BASOSABS 0.0 11/19/2014 1005   BASOSABS 0.0 09/02/2014 1007      Chemistry      Component Value Date/Time   NA 135 11/19/2014 1005   NA 137 09/02/2014 1007   K 4.1 11/19/2014 1005   K 4.5 09/02/2014 1007   CL 98* 11/19/2014 1005   CO2 26 11/19/2014 1005   CO2 27 09/02/2014 1007   BUN 17 11/19/2014 1005   BUN 10.7 09/02/2014 1007   CREATININE 1.77*  11/19/2014 1005   CREATININE 0.8 09/02/2014 1007      Component Value Date/Time   CALCIUM 8.9 11/19/2014 1005   CALCIUM 10.1 09/02/2014 1007   ALKPHOS 76 11/19/2014 1005   AST 15 11/19/2014 1005   ALT 7* 11/19/2014 1005   BILITOT 0.6 11/19/2014 1005        ASSESSMENT AND PLAN:  Malignant neoplasm of base of tongue Stage IVA, HPV-, 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  S/P concurrent chemoradiation with Cisplatin with curative intent.  Labs today: CBC diff, CMET  PET scan in 9 weeks (12 weeks out from radiation therapy)  Weight is up a few lbs since beginning of June 2016.  I am not sure the continuation or Procrit (started in [facility]) is the appropriate handling of the patient's Hgb at this time.  Will see what her renal function is today and Hgb  status.    Return in 4 weeks for follow-up.   THERAPY PLAN:  Continue with treatment as planned.  All questions were answered. The patient knows to call the clinic with any problems, questions or concerns. We can certainly see the patient much sooner if necessary.  Patient and plan discussed with Dr. Ancil Linsey and she is in agreement with the aforementioned.   This note is electronically signed by: Robynn Pane 12/21/2014 12:09 PM

## 2014-12-21 NOTE — Addendum Note (Signed)
Addended by: Berneta Levins on: 12/21/2014 02:28 PM   Modules accepted: Orders, SmartSet

## 2014-12-21 NOTE — Assessment & Plan Note (Addendum)
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  S/P concurrent chemoradiation with Cisplatin with curative intent.  Labs today: CBC diff, CMET  PET scan in 9 weeks (12 weeks out from radiation therapy)  Weight is up a few lbs since beginning of June 2016.  I am not sure the continuation or Procrit (started in [facility]) is the appropriate handling of the patient's Hgb at this time.  Will see what her renal function is today and Hgb status.    Return in 4 weeks for follow-up.

## 2014-12-21 NOTE — Telephone Encounter (Signed)
Shelby Larson called stating she received [name]'s message to come in for hydration and PRBCs tomorrow, 12/22/14.  Shelby Larson reports having a 9am dental then 1pm radiation appt tomorrow - patient advised to come directly to [facility] after her dental appt, and that she needs to reschedule her appt with [name] due to the need for PRBCs and hydration being time sensitive.  I made multiple attempts to contact [name], and without success.  Attempted to call patient at the numbers listed to notify her she absolutely needs to come for blood and hydration - unable to leave messages.  Will re-attempt contacting patient to ensure she calls to reschedule her appt with [name] and comes to [name] tomorrow for hydration and blood products before close of business today.

## 2014-12-21 NOTE — Addendum Note (Signed)
Addended by: Baird Cancer on: 12/21/2014 02:28 PM   Modules accepted: Orders

## 2014-12-22 ENCOUNTER — Encounter (HOSPITAL_BASED_OUTPATIENT_CLINIC_OR_DEPARTMENT_OTHER): Payer: 59

## 2014-12-22 ENCOUNTER — Encounter (HOSPITAL_COMMUNITY): Payer: Self-pay | Admitting: Dentistry

## 2014-12-22 ENCOUNTER — Ambulatory Visit (HOSPITAL_COMMUNITY): Payer: Self-pay | Admitting: Dentistry

## 2014-12-22 ENCOUNTER — Ambulatory Visit (HOSPITAL_COMMUNITY): Payer: Self-pay | Admitting: Physical Therapy

## 2014-12-22 VITALS — BP 159/93 | HR 65 | Temp 98.7°F | Resp 18

## 2014-12-22 VITALS — BP 121/71 | HR 81 | Temp 98.6°F | Wt 110.0 lb

## 2014-12-22 DIAGNOSIS — C01 Malignant neoplasm of base of tongue: Secondary | ICD-10-CM

## 2014-12-22 DIAGNOSIS — R682 Dry mouth, unspecified: Secondary | ICD-10-CM

## 2014-12-22 DIAGNOSIS — Z0189 Encounter for other specified special examinations: Secondary | ICD-10-CM

## 2014-12-22 DIAGNOSIS — Z9221 Personal history of antineoplastic chemotherapy: Secondary | ICD-10-CM

## 2014-12-22 DIAGNOSIS — D649 Anemia, unspecified: Secondary | ICD-10-CM | POA: Diagnosis not present

## 2014-12-22 DIAGNOSIS — K117 Disturbances of salivary secretion: Secondary | ICD-10-CM

## 2014-12-22 DIAGNOSIS — Z923 Personal history of irradiation: Secondary | ICD-10-CM

## 2014-12-22 DIAGNOSIS — Z5189 Encounter for other specified aftercare: Secondary | ICD-10-CM | POA: Diagnosis not present

## 2014-12-22 DIAGNOSIS — R1310 Dysphagia, unspecified: Secondary | ICD-10-CM

## 2014-12-22 DIAGNOSIS — R432 Parageusia: Secondary | ICD-10-CM

## 2014-12-22 MED ORDER — HEPARIN SOD (PORK) LOCK FLUSH 100 UNIT/ML IV SOLN
500.0000 [IU] | Freq: Every day | INTRAVENOUS | Status: AC | PRN
  Administered 2014-12-22: 500 [IU]

## 2014-12-22 MED ORDER — HEPARIN SOD (PORK) LOCK FLUSH 100 UNIT/ML IV SOLN
INTRAVENOUS | Status: AC
  Filled 2014-12-22: qty 5

## 2014-12-22 MED ORDER — DIPHENHYDRAMINE HCL 25 MG PO CAPS
25.0000 mg | ORAL_CAPSULE | Freq: Once | ORAL | Status: DC
Start: 2014-12-22 — End: 2014-12-22

## 2014-12-22 MED ORDER — ACETAMINOPHEN 325 MG PO TABS: 650.0000 mg | ORAL_TABLET | Freq: Once | ORAL | Status: DC

## 2014-12-22 MED ORDER — SODIUM CHLORIDE 0.9 % IJ SOLN
10.0000 mL | INTRAMUSCULAR | Status: AC | PRN
  Administered 2014-12-22: 10 mL

## 2014-12-22 MED ORDER — DIPHENHYDRAMINE HCL 50 MG/ML IJ SOLN
INTRAMUSCULAR | Status: AC
  Filled 2014-12-22: qty 1

## 2014-12-22 MED ORDER — DIPHENHYDRAMINE HCL 50 MG/ML IJ SOLN
25.0000 mg | Freq: Once | INTRAMUSCULAR | Status: AC
  Administered 2014-12-22: 25 mg via INTRAVENOUS

## 2014-12-22 MED ORDER — SODIUM CHLORIDE 0.9 % IV SOLN
INTRAVENOUS | Status: DC
  Administered 2014-12-22: 11:00:00 via INTRAVENOUS

## 2014-12-22 MED ORDER — SODIUM FLUORIDE 1.1 % DT CREA: TOPICAL_CREAM | DENTAL | Status: AC

## 2014-12-22 MED ORDER — SODIUM CHLORIDE 0.9 % IV SOLN: 250.0000 mL | Freq: Once | INTRAVENOUS | Status: DC

## 2014-12-22 NOTE — Patient Instructions (Addendum)
RECOMMENDATIONS: 1. Brush after meals and at bedtime.  Use PreviDent 5000 Plus fluoride at bedtime. 2. Use trismus exercises as directed. 3. Use Biotene Rinse or salt water/baking soda rinses. 4. Multiple sips of water as needed. 5. Return to her primary dentist for exam and cleaning in late August 2016.   Lenn Cal, DDS  RADIATION THERAPY AND DECISIONS REGARDING YOUR TEETH  Xerostomia (dry mouth) Your salivary glands may be in the filed of radiation.  Radiation may include all or part of your saliva glands.  This will cause your saliva to dry up and you will have a dry mouth.  The dry mouth will be for the rest of your life unless your radiation oncologist tells you otherwise.  Your saliva has many functions:  Saliva wets your tongue for speaking.  It coats your teeth and the inside of your mouth for easier movement.  It helps with chewing and swallowing food.  It helps clean away harmful acid and toxic products made by the germs in your mouth, therefore it helps prevent cavities.  It kills some germs in your mouth and helps to prevent gum disease.  It helps to carry flavor to your taste buds.  Once you have lost your saliva you will be at higher risk for tooth decay and gum disease.  What can be done to help improve your mouth when there's not enough saliva:  1.  Your dentist may give a prescription for Salagen.  It will not bring back all of your saliva but may bring back some of it.  Also your saliva may be thick and ropy or white and foamy. It will not feel like it use to feel.  2.  You will need to swish with water every time your mouth feels dry.  YOU CANNOT suck on any cough drops, mints, lemon drops, candy, vitamin C or any other products.  You cannot use anything other than water to make your mouth feel less dry.  If you want to drink anything else you have to drink it all at once and brush afterwards.  Be sure to discuss the details of your diet habits with your  dentist or hygienist.  Radiation caries: This is decay that happens very quickly once your mouth is very dry due to radiation therapy.  Normally cavities take six months to two years to become a problem.  When you have dry mouth cavities may take as little as eight weeks to cause you a problem.  This is why dental check ups every two months are necessary as long as you have a dry mouth. Radiation caries typically, but not always, start at your gum line where it is hard to see the cavity.  It is therefore also hard to fill these cavities adequately.  This high rate of cavities happens because your mouth no longer has saliva and therefore the acid made by the germs starts the decay process.  Whenever you eat anything the germs in your mouth change the food into acid.  The acid then burns a small hole in your tooth.  This small hole is the beginning of a cavity.  If this is not treated then it will grow bigger and become a cavity.  The way to avoid this hole getting bigger is to use fluoride every evening as prescribed by your dentist.  You have to make sure that your teeth are very clean before you use the fluoride.  This fluoride in turn will strengthen your  teeth and prepare them for another day of fighting acid.  If you develop radiation caries many times the damage is so large that you will have to have all your teeth removed.  This could be a big problem if some of these teeth are in the field of radiation.  Further details of why this could be a big problem will follow.  (See Osteoradionecrosis).  Loss of taste (dysgeusia) This happens to varying degrees once you've had radiation therapy to your jaw region.  Many times taste is not completely lost but becomes limited.  The loss of taste is mostly due to radiation affecting your taste buds.  However if you have no saliva in your mouth to carry the flavor to your taste buds it would be difficult for your taste buds to taste anything.  That is why using  water or a prescription for Salagen prior to meals and during meals may help with some of the taste.  Keep in mind that taste generally returns very slowly over the course of several months or several years after radiation therapy.  Don't give up hope.  Trismus According to your Radiation Oncologist your TMJ or jaw joints are going to be partially or fully in the field of radiation.  This means that over time the muscles that help you open and close your mouth may get stiff.  This will potentially result in your not being able to open your mouth wide enough or as wide as you can open it now.  Le me give you an example of how slowly this happens and how unaware people are of it.  A gentlemen that had radiation therapy two years ago came back to me complaining that bananas are just too large for him to be able to fit them in between his teeth.  He was not able to open wide enough to bite into a banana.  This happens slowly and over a period of time.  What do we do to try and prevent this?  Your dentist will probably give you a stack of sticks called a trismus exercise device .  This stack will help your remind your muscles and your jaw joint to open up to the same distance every day.  Use these sticks every morning when you wake up according to the instructions given by the dentist.   You must use these sticks for at least one to two years after radiation therapy.  The reason for that is because it happens so slowly and keeps going on for about two years after radiation therapy.  Your hospital dentist will help you monitor your mouth opening and make sure that it's not getting smaller.  Osteoradionecrosis (ORN) This is a condition where your jaw bone after having had radiation therapy becomes very dry.  It has very little blood supply to keep it alive.  If you develop a cavity that turns into an abscess or an infection then the jaw bone does not have enough blood supply to help fight the infection.  At this  point it is very likely that the infection could cause the death of your jaw bone.  When you have dead bone it has to be removed.  Therefore you might end up having to have surgery to remove part of your jaw bone, the part of the jaw bone that has been affected.   Healing is also a problem if you are to have surgery in the areas where the bone has had radiation  therapy.  The same reasons apply.  If you have surgery you need more blood supply which is not available.  When blood supply and oxygen are not available again, there is a chance for the bone to die.  Occasionally ORN happens on its own with no obvious reason.  This is quite rare.  We believe that patients who continue to smoke and/or drink alcohol have a higher chance of having this bone problem.  Therefore once your jaw bone has had radiation therapy if there are any teeth in that area, you should never have them pulled.  You should also never have any surgery on your teeth or gums in that area unless the oral surgeon or Periodontist is aware of your history of radiation. There is some expensive management techniques that might be used to limit your risks.  The risks for ORN either from infection or spontaneous ( or on it's own) are life long.      TRISMUS  Trismus is a condition where the jaw does not allow the mouth to open as wide as it usually does.  This can happen almost suddenly, or in other cases the process is so slow, it is hard to notice it-until it is too far along.  When the jaw joints and/or muscles have been exposed to radiation treatments, the onset of Trismus is very slow.  This is because the muscles are losing their stretching ability over a long period of time, as long as 2 YEARS after the end of radiation.  It is therefore important to exercise these muscles and joints.  TRISMUS EXERCISES   Stack of tongue depressors measuring the same or a little less than the last documented MIO (Maximum Interincisal Opening).  Secure  them with a rubber band on both ends.  Place the stack in the patient's mouth, supporting the other end.  Allow 30 seconds for muscle stretching.  Rest for a few seconds.  Repeat 3-5 times  For all radiation patients, this exercise is recommended in the mornings and evenings unless otherwise instructed.  The exercise should be done for a period of 2 YEARS after the end of radiation.  MIO should be checked routinely on recall dental visits by the general dentist or the hospital dentist.  The patient is advised to report any changes, soreness, or difficulties encountered when doing the exercises.

## 2014-12-22 NOTE — Progress Notes (Signed)
12/22/2014  Patient Name:   Shelby Larson Date of Birth:   June 14, 1966 Medical Record Number: [id number]  BP 121/71 mmHg  Pulse 81  Temp(Src) 98.6 F (37 C) (Oral)  Wt 110 lb (49.896 kg)  [name] presents for oral examination after chemoradiation therapy. Patient has completed all  radiation treatments from 10/09/2014 through 11/30/2014 with [name]. She is status post chemotherapy with Dr. Olin Pia.  REVIEW OF CHIEF COMPLAINTS:  DRY MOUTH: Yes. HARD TO SWALLOW: Yes, at times.  HURT TO SWALLOW: Yes, at times. TASTE CHANGES: Taste is returning slowly. SORES IN MOUTH: Patient denies. TRISMUS: Patient denies problems with trismus. WEIGHT: 110 pounds.  HOME OH REGIMEN:  BRUSHING: Twice a day  FLOSSING: Once a day RINSING: Using salt water rinses. FLUORIDE: Patient lost her fluoride trays. Patient agrees to proceed with use of PreviDent 5000 toothpaste at bedtime. TRISMUS EXERCISES:  Maximum interincisal opening: 43 mm   DENTAL EXAM:  Oral Hygiene:(PLAQUE): Good oral hygiene. LOCATION OF MUCOSITIS: None noted DESCRIPTION OF SALIVA: Decreased saliva. Mild to moderate xerostomia. ANY EXPOSED BONE: None noted. OTHER WATCHED AREAS: Previous extraction sites especially upper right and upper left molar areas have healed in completely with no evidence of sinus involvement. DX: Xerostomia, Dysgeusia, Dysphagia and Odynophagia  RECOMMENDATIONS: 1. Brush after meals and at bedtime.  Use PreviDent 5000 Plus fluoride at bedtime. 2. Use trismus exercises as directed. 3. Use Biotene Rinse or salt water/baking soda rinses. 4. Multiple sips of water as needed. 5. Return to her primary dentist for exam and cleaning in late August 2016.   Lenn Cal, DDS

## 2014-12-23 LAB — TYPE AND SCREEN
ABO/RH(D): A POS
Antibody Screen: NEGATIVE
Unit division: 0
Unit division: 0

## 2014-12-24 ENCOUNTER — Ambulatory Visit (HOSPITAL_COMMUNITY): Payer: Self-pay | Admitting: Physical Therapy

## 2015-01-04 ENCOUNTER — Encounter (HOSPITAL_COMMUNITY): Payer: 59 | Attending: Hematology & Oncology

## 2015-01-04 ENCOUNTER — Other Ambulatory Visit (HOSPITAL_COMMUNITY): Payer: Self-pay | Admitting: Oncology

## 2015-01-04 ENCOUNTER — Telehealth (HOSPITAL_COMMUNITY): Payer: Self-pay | Admitting: *Deleted

## 2015-01-04 DIAGNOSIS — K5903 Drug induced constipation: Secondary | ICD-10-CM

## 2015-01-04 DIAGNOSIS — C140 Malignant neoplasm of pharynx, unspecified: Secondary | ICD-10-CM

## 2015-01-04 DIAGNOSIS — C01 Malignant neoplasm of base of tongue: Secondary | ICD-10-CM | POA: Insufficient documentation

## 2015-01-04 DIAGNOSIS — T402X5A Adverse effect of other opioids, initial encounter: Principal | ICD-10-CM

## 2015-01-04 DIAGNOSIS — Z87891 Personal history of nicotine dependence: Secondary | ICD-10-CM | POA: Diagnosis not present

## 2015-01-04 DIAGNOSIS — Z452 Encounter for adjustment and management of vascular access device: Secondary | ICD-10-CM

## 2015-01-04 LAB — CBC WITH DIFFERENTIAL/PLATELET
Basophils Absolute: 0 10*3/uL (ref 0.0–0.1)
Basophils Relative: 0 % (ref 0–1)
Eosinophils Absolute: 0.1 10*3/uL (ref 0.0–0.7)
Eosinophils Relative: 3 % (ref 0–5)
HCT: 31.1 % — ABNORMAL LOW (ref 36.0–46.0)
HEMOGLOBIN: 10.3 g/dL — AB (ref 12.0–15.0)
LYMPHS ABS: 0.6 10*3/uL — AB (ref 0.7–4.0)
LYMPHS PCT: 11 % — AB (ref 12–46)
MCH: 30.3 pg (ref 26.0–34.0)
MCHC: 33.1 g/dL (ref 30.0–36.0)
MCV: 91.5 fL (ref 78.0–100.0)
MONO ABS: 0.6 10*3/uL (ref 0.1–1.0)
MONOS PCT: 12 % (ref 3–12)
Neutro Abs: 3.9 10*3/uL (ref 1.7–7.7)
Neutrophils Relative %: 74 % (ref 43–77)
Platelets: 198 10*3/uL (ref 150–400)
RBC: 3.4 MIL/uL — ABNORMAL LOW (ref 3.87–5.11)
RDW: 18.2 % — ABNORMAL HIGH (ref 11.5–15.5)
WBC: 5.3 10*3/uL (ref 4.0–10.5)

## 2015-01-04 LAB — BASIC METABOLIC PANEL
Anion gap: 9 (ref 5–15)
BUN: 36 mg/dL — AB (ref 6–20)
CALCIUM: 8.6 mg/dL — AB (ref 8.9–10.3)
CHLORIDE: 97 mmol/L — AB (ref 101–111)
CO2: 28 mmol/L (ref 22–32)
CREATININE: 1.94 mg/dL — AB (ref 0.44–1.00)
GFR calc Af Amer: 34 mL/min — ABNORMAL LOW (ref >60)
GFR calc non Af Amer: 29 mL/min — ABNORMAL LOW (ref >60)
Glucose, Bld: 80 mg/dL (ref 65–99)
Potassium: 3.7 mmol/L (ref 3.5–5.1)
Sodium: 134 mmol/L — ABNORMAL LOW (ref 135–145)

## 2015-01-04 MED ORDER — NALOXEGOL OXALATE 25 MG PO TABS: 25.0000 mg | ORAL_TABLET | Freq: Every morning | ORAL | Status: DC

## 2015-01-04 MED ORDER — PROCHLORPERAZINE MALEATE 10 MG PO TABS: 10.0000 mg | ORAL_TABLET | Freq: Four times a day (QID) | ORAL | Status: DC | PRN

## 2015-01-04 MED ORDER — HEPARIN SOD (PORK) LOCK FLUSH 100 UNIT/ML IV SOLN
500.0000 [IU] | Freq: Once | INTRAVENOUS | Status: AC
  Administered 2015-01-04: 500 [IU] via INTRAVENOUS
  Filled 2015-01-04: qty 5

## 2015-01-04 MED ORDER — SODIUM CHLORIDE 0.9 % IJ SOLN
10.0000 mL | INTRAMUSCULAR | Status: DC | PRN
  Administered 2015-01-04: 10 mL via INTRAVENOUS
  Filled 2015-01-04: qty 10

## 2015-01-04 NOTE — Progress Notes (Signed)
Shelby Larson presented for labwork. Labs per MD order drawn via Portacath located in the right chest wall accessed with  H 20 needle. Good blood return present. Procedure without incident.  Needle removed intact. Patient tolerated procedure well.  Patient notified that lab values were good and that refills were submitted for the Movantik and compazine.

## 2015-01-11 ENCOUNTER — Ambulatory Visit (HOSPITAL_COMMUNITY): Payer: 59 | Attending: Oncology | Admitting: Speech Pathology

## 2015-01-11 DIAGNOSIS — R1314 Dysphagia, pharyngoesophageal phase: Secondary | ICD-10-CM | POA: Diagnosis not present

## 2015-01-11 NOTE — Therapy (Signed)
Shelby Larson [place], [place], [address] Phone: [phone]   Fax:  [phone]  Speech Language Pathology Treatment  Patient Details  Name: Shelby Larson MRN: [id number] Date of Birth: Jun 20, 1966 Referring Provider:  Patrici Ranks, MD  Encounter Date: 01/11/2015      End of Session - 01/11/15 1215    Visit Number 3   Number of Visits 8   Date for SLP Re-Evaluation 01/19/15   Authorization Type [employer]   SLP Start Time 0941   SLP Stop Time  1022   SLP Time Calculation (min) 41 min   Activity Tolerance Patient tolerated treatment well      Past Medical History  Diagnosis Date  . Heart murmur     as a child  . Chronic bronchitis   . Anxiety     panic attacks  . Depression   . Headache     onset a few months ago  . Neuropathy     had it in both hands  . Arthritis   . Fibromyalgia   . 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 Anemia   . Rosacea   . Hypercholesterolemia   . Asthma   . COPD (chronic obstructive pulmonary disease)   . Seizures     takes Gabapentin  (last one in 2010)    Past Surgical History  Procedure Laterality Date  . Wisdom tooth extraction    . Tonsillectomy Left 08/20/2014    Procedure: TONSILLECTOMY;  Surgeon: Ruby Cola, MD;  Location: [facility] OR;  Service: ENT;  Laterality: Left;  . Radical neck dissection Left 08/20/2014    Procedure: RADICAL LEFT NECK DISSECTION ;  Surgeon: Ruby Cola, MD;  Location: [facility];  Service: ENT;  Laterality: Left;  . Tracheostomy tube placement N/A 08/20/2014    Procedure: TRACHEOSTOMY - AWAKE;  Surgeon: Ruby Cola, MD;  Location: [facility];  Service: ENT;  Laterality: N/A;  . Gastrostomy tube placement    . Multiple extractions with alveoloplasty N/A 09/17/2014    Procedure: Extraction of tooth #'s [id number] with alveoloplasty, mandibular left lingual torus reduction, and gross debridement of remaining  teeth.;  Surgeon: Lenn Cal, DDS;  Location: [facility];  Service: Oral Surgery;  Laterality: N/A;    There were no vitals filed for this visit.  Visit Diagnosis: Dysphagia, pharyngoesophageal phase      Subjective Assessment - 01/11/15 1210    Subjective "I have good days and bad days."   Currently in Pain? No/denies               ADULT SLP TREATMENT - 01/11/15 1211    General Information   Behavior/Cognition Alert;Cooperative;Pleasant mood   Patient Positioning Upright in chair   Oral care provided N/A   HPI Shelby Larson 49 y.o. 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   Treatment Provided   Treatment provided Dysphagia   Dysphagia Treatment   Temperature Spikes Noted No   Respiratory Status Trach   Oral Cavity - Dentition Missing dentition   Treatment Methods Skilled observation;Therapeutic exercise;Compensation strategy training;Patient/caregiver education   Patient observed directly with PO's Yes   Type of PO's observed Thin liquids   Feeding Able to feed self  Liquids provided via Cup  bottle of water   Pharyngeal Phase Signs & Symptoms Suspected delayed swallow initiation;Delayed cough   Type of cueing Verbal   Amount of cueing Minimal   Pain Assessment   Pain Assessment No/denies pain   Assessment / Recommendations / Plan   Plan Continue with current plan of care   Dysphagia Recommendations   Diet recommendations Dysphagia 3 (mechanical soft);Thin liquid   Liquids provided via Cup   Medication Administration --  currently taking via PEG   Supervision Patient able to self feed   Compensations Multiple dry swallows after each bite/sip;Effortful swallow   Postural Changes and/or Swallow Maneuvers Out of bed for  meals;Seated upright 90 degrees;Upright 30-60 min after meal  wear PMSV with po intake   General Recommendations   Oral Care Recommendations Oral care before and after PO;Patient independent with oral care   Follow up Recommendations Outpatient SLP   Progression Toward Goals   Progression toward goals Progressing toward goals            SLP Short Term Goals - 01/11/15 1227    SLP SHORT TERM GOAL #1   Title Pt will complete pharyngeal strengthening exercises and OME as assigned with use of written cue 2x/day.   Baseline none   Time 8   Period Weeks   Status On-going   SLP SHORT TERM GOAL #2   Title Pt will consume mechanical soft diet and all liquid consistencies with use of strategies PRN.   Baseline only eating jello and water   Time 8   Period Weeks   Status On-going          SLP Long Term Goals - 01/11/15 1229    SLP LONG TERM GOAL #1   Title Pt will demonstrate safe and efficient consumption of highest recommended diet with use of strategies.   Baseline only tolerating small sips thin liquid and jello   Time 8   Period Weeks   Status On-going   SLP LONG TERM GOAL #2   Title Pt will be independent with pharyngeal and laryngeal exercises with use of written cue.   Baseline max assist   Time 8   Period Weeks   Status On-going          Plan - 01/11/15 1216    Clinical Impression Statement Shelby Larson was seen for follow up dysphagia therapy following treatment for H&N cancer s/p chemo/radiation. She has completed all treatments at this time and reports that she goes back for a PET scan at the end of August. She has PEG, trach, and porta-cath placed and anticipates having trach removed in October if all goes according to plan. Dr. Simeon Craft is following her for trach care. She continues to have continuous overnight feeds at a rate of 100 ml/hour per pt report and states she is tolerating them well. She does not use PEG during the day and is trying to eat/drink more by  mouth. She complains of lack of taste and some "burning" when consuming certain foods. She reports tolerating: water, orange crush, shirley temple, coffee, occasional shake, roast beef, ham, and Krispie Kream donuts. She has completed dental visits with Dr. Enrique Sack but was told to continue trismus exercises. Swallowing exercises were reviewed and provided in written form. She was able to return demonstrate after direct model, however will need additional follow up next session to ensure she can complete on her own. Ms. Bieda was encouraged to keep a notebook to help keep her  organized and compensate for short term memory deficits (her report) as well as to record po intake. Pt requested a spare trach tube holder and part for her feeding tube, however SLP explained that we could not provide that here. She plans to go next door to [place] to see if they carried the items. Plan to see pt next week for another session and consider discharge if pt ready/independent with exercises.    Speech Therapy Frequency 1x /week   Duration --  8 weeks   Treatment/Interventions Aspiration precaution training;Pharyngeal strengthening exercises;Diet toleration management by SLP;Compensatory techniques;Trials of upgraded texture/liquids;Cueing hierarchy;SLP instruction and feedback;Patient/family education  MBSS   Potential to Achieve Goals Fair   Potential Considerations Pain level   SLP Home Exercise Plan Pt will complete HEP as assigned to faciliate carryover of treatment strategies in home environment   Consulted and Agree with Plan of Care Patient        Problem List Patient Active Problem List   Diagnosis Date Noted  . 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 Therapeutic opioid induced constipation 09/16/2014  . 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 Malignant neoplasm of base of tongue 09/02/2014  . 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

## 2015-01-18 ENCOUNTER — Ambulatory Visit (HOSPITAL_COMMUNITY): Payer: 59 | Admitting: Speech Pathology

## 2015-01-18 DIAGNOSIS — R1314 Dysphagia, pharyngoesophageal phase: Secondary | ICD-10-CM

## 2015-01-18 NOTE — Therapy (Signed)
[place], [place], [address] Phone: [phone]   Fax:  [phone]  Speech Language Pathology Treatment  Patient Details  Name: Shelby Larson MRN: [id number] Date of Birth: [birth year] Referring Provider:  Patrici Ranks, MD  Encounter Date: 01/18/2015      End of Session - 01/18/15 1444    Visit Number 4   Number of Visits 8   Date for SLP Re-Evaluation 01/19/15   Authorization Type [employer]   SLP Start Time [phone]   SLP Stop Time  3536   SLP Time Calculation (min) 41 min   Activity Tolerance Patient tolerated treatment well      Past Medical History  Diagnosis Date  . Heart murmur     as a child  . Chronic bronchitis   . Anxiety     panic attacks  . Depression   . Headache     onset a few months ago  . Neuropathy     had it in both hands  . Arthritis   . Fibromyalgia   . 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 Anemia   . Rosacea   . Hypercholesterolemia   . Asthma   . COPD (chronic obstructive pulmonary disease)   . Seizures     takes Gabapentin  (last one in 2010)    Past Surgical History  Procedure Laterality Date  . Wisdom tooth extraction    . Tonsillectomy Left 08/20/2014    Procedure: TONSILLECTOMY;  Surgeon: Ruby Cola, MD;  Location: [facility] OR;  Service: ENT;  Laterality: Left;  . Radical neck dissection Left 08/20/2014    Procedure: RADICAL LEFT NECK DISSECTION ;  Surgeon: Ruby Cola, MD;  Location: [place];  Service: ENT;  Laterality: Left;  . Tracheostomy tube placement N/A 08/20/2014    Procedure: TRACHEOSTOMY - AWAKE;  Surgeon: Ruby Cola, MD;  Location: [place];  Service: ENT;  Laterality: N/A;  . Gastrostomy tube placement    . Multiple extractions with alveoloplasty N/A 09/17/2014    Procedure: Extraction of tooth #'s [phone] with alveoloplasty, mandibular left lingual torus reduction, and gross debridement of remaining  teeth.;  Surgeon: Lenn Cal, DDS;  Location: [place];  Service: Oral Surgery;  Laterality: N/A;    There were no vitals filed for this visit.  Visit Diagnosis: Dysphagia, pharyngoesophageal phase      Subjective Assessment - 01/18/15 1438    Subjective "I haven't been doing so great."   Currently in Pain? No/denies               ADULT SLP TREATMENT - 01/18/15 1440    General Information   Behavior/Cognition Alert;Cooperative;Pleasant mood   Patient Positioning Upright in chair   Oral care provided N/A   HPI Shelby Larson 49 y.o. 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   Treatment Provided   Treatment provided Dysphagia   Dysphagia Treatment   Temperature Spikes Noted No   Respiratory Status Trach   Oral Cavity - Dentition Missing dentition   Treatment Methods Skilled observation;Therapeutic exercise;Compensation strategy training;Patient/caregiver education   Patient observed directly with PO's Yes   Type of PO's observed Thin liquids   Feeding Able to feed self  Liquids provided via Cup   Pharyngeal Phase Signs & Symptoms Suspected delayed swallow initiation   Type of cueing Verbal   Amount of cueing Modified independent   Pain Assessment   Pain Assessment No/denies pain   Assessment / Recommendations / Plan   Plan Continue with current plan of care   Dysphagia Recommendations   Diet recommendations Dysphagia 3 (mechanical soft);Thin liquid   Liquids provided via Cup   Medication Administration Crushed with puree  or via PEG   Supervision Patient able to self feed   Compensations Multiple dry swallows after each bite/sip;Effortful swallow   Postural Changes and/or Swallow Maneuvers Out of bed for meals;Seated upright 90  degrees;Upright 30-60 min after meal   Progression Toward Goals   Progression toward goals Progressing toward goals          SLP Education - 01/18/15 1443    Education provided Yes   Education Details plan of care; request PT referral, trach follow up, continue PO intake   Person(s) Educated Patient   Methods Explanation   Comprehension Verbalized understanding          SLP Short Term Goals - 01/18/15 1447    SLP SHORT TERM GOAL #1   Title Pt will complete pharyngeal strengthening exercises and OME as assigned with use of written cue 2x/day.   Baseline none   Time 8   Period Weeks   Status On-going   SLP SHORT TERM GOAL #2   Title Pt will consume mechanical soft diet and all liquid consistencies with use of strategies PRN.   Baseline only eating jello and water   Time 8   Period Weeks   Status On-going          SLP Long Term Goals - 01/18/15 1447    SLP LONG TERM GOAL #1   Title Pt will demonstrate safe and efficient consumption of highest recommended diet with use of strategies.   Baseline only tolerating small sips thin liquid and jello   Time 8   Period Weeks   Status On-going   SLP LONG TERM GOAL #2   Title Pt will be independent with pharyngeal and laryngeal exercises with use of written cue.   Baseline max assist   Time 8   Period Weeks   Status On-going          Plan - 01/18/15 1446    Clinical Impression Statement Ms. Schriner was seen for follow up dysphagia therapy following treatment for H&N cancer s/p chemo/radiation. She has completed all treatments at this time and reports that she goes back for a PET scan at the end of August. She has PEG, trach, and porta-cath placed and anticipates having trach removed in October if all goes according to plan. Dr. Simeon Craft is following her for trach care. She continues to have continuous overnight feeds at a rate of 100 ml/hour per pt report and states she is tolerating them well. She does not use PEG during the  day and is trying to eat/drink more by mouth. She complains of lack of taste and some "burning" when consuming certain foods. She reports tolerating: water, orange crush, shirley temple, coffee, occasional shake, roast beef, ham, and Krispie Kream donuts. She has completed dental visits with Dr. Enrique Sack but was told to continue trismus exercises. Swallowing exercises were reviewed and provided in written form. Ms. Cellucci was encouraged to keep a notebook to help keep her organized and compensate for short term memory deficits (her report) as well  as to record po intake.   Pt currently reports that she started feeling badly last Wednesday 01/13/2015 characterized by congestion and "tightness" feeling in her neck. She reports that she has a lot of phlegm and vomited a few times last week. She ate a hot dog and vomited a few hours later. She is only using tube feeds over night and is taking her medications by mouth crushed in applesauce. SLP questioned whether she felt as if she might be getting sick from taking meds on an empty stomach to which she was unsure. She reports not being able to crush medications finely enough to place via PEG. She uses a pill crusher. She also states that she only takes the bare minimum of her medications (however I do not know how she determines which are most prudent to take). She reports not tolerating much by mouth except for liquids. From a swallow function standpoint, this is ok as long as she continues to sip on liquids throughout the day which she says she does without issue. She also plans to continue trying different foods periodically. She would like to have her trach removed at some point, but also tells me that she finds it difficult to breath with PMSV in place. I explained that it will be important for her to document how long she tolerates the PMSV before graduating to button and also do document po intake before tapering of tube feeds. Pt reports feeling confused about  who she is supposed to follow up with when she is not feeling well. She does not know if she is vomiting from effects of chemo or radiation or something else. Ms. Blaszczyk was encouraged to follow up with Dr. Natale Lay her coordinator to determine who she needs to see. She has an appointment tomorrow with Dr. Whitney Muse. She reports feeling frustrated about her current state and feels "depressed". She previously took Zoloft and it is unclear if she still takes this. From SLP standpoint, Ms. Stuckey needs to: - Record po intake - Record when she vomits - Continue drinking and eating what she can by mouth while also using tube feeds - Monitor weight - Consider trial of button/cap per trach team/ENT - Continue with pharyngeal strengthening exercises - Address depression with physician - Obtain PT referral for outpatient PT for increasing strength and endurance - Apply for medicaid (pt states she has done this as she states her insurance company is "dropping" her - Ask MD whether she would qualify for disability -F/U with SLP/me in ~4 weeks   Speech Therapy Frequency Monthly   Duration --  8 weeks   Treatment/Interventions Aspiration precaution training;Pharyngeal strengthening exercises;Diet toleration management by SLP;Compensatory techniques;Trials of upgraded texture/liquids;Cueing hierarchy;SLP instruction and feedback;Patient/family education  MBSS   Potential to Achieve Goals Fair   Potential Considerations Pain level   SLP Home Exercise Plan Pt will complete HEP as assigned to faciliate carryover of treatment strategies in home environment   Consulted and Agree with Plan of Care Patient        Problem List Patient Active Problem List   Diagnosis Date Noted  . 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 Therapeutic opioid induced constipation 09/16/2014  . 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 Malignant neoplasm of base of tongue 09/02/2014  . 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

## 2015-01-19 ENCOUNTER — Encounter: Payer: Self-pay | Admitting: Dietician

## 2015-01-19 ENCOUNTER — Encounter (HOSPITAL_BASED_OUTPATIENT_CLINIC_OR_DEPARTMENT_OTHER): Payer: 59 | Admitting: Hematology & Oncology

## 2015-01-19 ENCOUNTER — Encounter (HOSPITAL_COMMUNITY): Payer: Self-pay | Admitting: Hematology & Oncology

## 2015-01-19 ENCOUNTER — Encounter (HOSPITAL_COMMUNITY): Payer: 59

## 2015-01-19 ENCOUNTER — Encounter (HOSPITAL_COMMUNITY): Payer: Self-pay | Admitting: Oncology

## 2015-01-19 VITALS — BP 132/84 | HR 92 | Temp 99.1°F | Resp 18 | Wt 102.9 lb

## 2015-01-19 DIAGNOSIS — R110 Nausea: Secondary | ICD-10-CM

## 2015-01-19 DIAGNOSIS — C140 Malignant neoplasm of pharynx, unspecified: Secondary | ICD-10-CM

## 2015-01-19 DIAGNOSIS — K1230 Oral mucositis (ulcerative), unspecified: Secondary | ICD-10-CM | POA: Diagnosis not present

## 2015-01-19 DIAGNOSIS — R634 Abnormal weight loss: Secondary | ICD-10-CM | POA: Diagnosis not present

## 2015-01-19 DIAGNOSIS — R635 Abnormal weight gain: Secondary | ICD-10-CM

## 2015-01-19 DIAGNOSIS — D649 Anemia, unspecified: Secondary | ICD-10-CM

## 2015-01-19 DIAGNOSIS — C01 Malignant neoplasm of base of tongue: Secondary | ICD-10-CM | POA: Diagnosis not present

## 2015-01-19 DIAGNOSIS — F329 Major depressive disorder, single episode, unspecified: Secondary | ICD-10-CM

## 2015-01-19 DIAGNOSIS — R944 Abnormal results of kidney function studies: Secondary | ICD-10-CM

## 2015-01-19 LAB — COMPREHENSIVE METABOLIC PANEL
ALBUMIN: 3.9 g/dL (ref 3.5–5.0)
ALT: 7 U/L — ABNORMAL LOW (ref 14–54)
AST: 17 U/L (ref 15–41)
Alkaline Phosphatase: 69 U/L (ref 38–126)
Anion gap: 11 (ref 5–15)
BUN: 48 mg/dL — ABNORMAL HIGH (ref 6–20)
CALCIUM: 9.3 mg/dL (ref 8.9–10.3)
CHLORIDE: 90 mmol/L — AB (ref 101–111)
CO2: 29 mmol/L (ref 22–32)
Creatinine, Ser: 2.14 mg/dL — ABNORMAL HIGH (ref 0.44–1.00)
GFR calc non Af Amer: 26 mL/min — ABNORMAL LOW (ref >60)
GFR, EST AFRICAN AMERICAN: 30 mL/min — AB (ref >60)
Glucose, Bld: 92 mg/dL (ref 65–99)
POTASSIUM: 4.1 mmol/L (ref 3.5–5.1)
SODIUM: 130 mmol/L — AB (ref 135–145)
Total Bilirubin: 0.6 mg/dL (ref 0.3–1.2)
Total Protein: 7.4 g/dL (ref 6.5–8.1)

## 2015-01-19 LAB — CBC WITH DIFFERENTIAL/PLATELET
Basophils Absolute: 0 10*3/uL (ref 0.0–0.1)
Basophils Relative: 0 % (ref 0–1)
EOS PCT: 1 % (ref 0–5)
Eosinophils Absolute: 0.1 10*3/uL (ref 0.0–0.7)
HCT: 28.3 % — ABNORMAL LOW (ref 36.0–46.0)
HEMOGLOBIN: 9.6 g/dL — AB (ref 12.0–15.0)
Lymphocytes Relative: 6 % — ABNORMAL LOW (ref 12–46)
Lymphs Abs: 0.6 10*3/uL — ABNORMAL LOW (ref 0.7–4.0)
MCH: 31.2 pg (ref 26.0–34.0)
MCHC: 33.9 g/dL (ref 30.0–36.0)
MCV: 91.9 fL (ref 78.0–100.0)
Monocytes Absolute: 0.7 10*3/uL (ref 0.1–1.0)
Monocytes Relative: 8 % (ref 3–12)
Neutro Abs: 7.5 10*3/uL (ref 1.7–7.7)
Neutrophils Relative %: 85 % — ABNORMAL HIGH (ref 43–77)
PLATELETS: 219 10*3/uL (ref 150–400)
RBC: 3.08 MIL/uL — ABNORMAL LOW (ref 3.87–5.11)
RDW: 17.7 % — ABNORMAL HIGH (ref 11.5–15.5)
WBC: 8.8 10*3/uL (ref 4.0–10.5)

## 2015-01-19 LAB — PREALBUMIN: PREALBUMIN: 33.3 mg/dL (ref 18–38)

## 2015-01-19 MED ORDER — ESCITALOPRAM OXALATE 20 MG PO TABS: ORAL_TABLET | ORAL | Status: DC

## 2015-01-19 MED ORDER — HEPARIN SOD (PORK) LOCK FLUSH 100 UNIT/ML IV SOLN
500.0000 [IU] | Freq: Once | INTRAVENOUS | Status: AC
  Administered 2015-01-19: 500 [IU] via INTRAVENOUS
  Filled 2015-01-19: qty 5

## 2015-01-19 MED ORDER — LORAZEPAM 1 MG PO TABS: ORAL_TABLET | ORAL | Status: DC

## 2015-01-19 MED ORDER — SODIUM CHLORIDE 0.9 % IJ SOLN
10.0000 mL | INTRAMUSCULAR | Status: DC | PRN
  Administered 2015-01-19: 10 mL via INTRAVENOUS
  Filled 2015-01-19: qty 10

## 2015-01-19 MED ORDER — FENTANYL 75 MCG/HR TD PT72: 75.0000 ug | MEDICATED_PATCH | TRANSDERMAL | Status: DC

## 2015-01-19 MED ORDER — CYCLOBENZAPRINE HCL 10 MG PO TABS: 10.0000 mg | ORAL_TABLET | Freq: Three times a day (TID) | ORAL | Status: DC | PRN

## 2015-01-19 MED ORDER — SUCRALFATE 1 G PO TABS: ORAL_TABLET | ORAL | Status: DC

## 2015-01-19 MED ORDER — PROCHLORPERAZINE MALEATE 10 MG PO TABS: 10.0000 mg | ORAL_TABLET | Freq: Four times a day (QID) | ORAL | Status: DC | PRN

## 2015-01-19 MED ORDER — HYDROCODONE-ACETAMINOPHEN 10-325 MG PO TABS: 1.0000 | ORAL_TABLET | Freq: Four times a day (QID) | ORAL | Status: DC | PRN

## 2015-01-19 NOTE — Progress Notes (Signed)
Shelby Labrum, MD [name] [address]  Malignant neoplasm of base of tongue   Staging form: Lip and Oral Cavity, AJCC 7th Edition     Clinical stage from 09/02/2014: Stage IVA (T2, N2c, M0) - Unsigned  HPV negative At least T2N2cMx HPV negative moderately differentiated Stage IVA Squamous cell carcinoma, base of tongue  Dr Simeon Craft 08/20/14 Left selective neck dissection with sparing of 11th cranial nerve, sternocleidomastoid muscle, internal jugular vein, biopsy , tracheotomy    CURRENT THERAPY:Observation  INTERVAL HISTORY: Shelby Larson 49 y.o. 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 She has completed concurrent XRT/cisplatin. She continues to have problems with nutrition and weight loss. She is otherwise doing quite well. She wants to have her tracheostomy removed.  The patient is present today with her mother. The dietician is present in the office visit as well today. Food has no taste. She states that the smell of food and taste of food makes her nauseated. She uses only 5 cans of tube feeding a day. She is able to drink without any difficulty. Her pain is slowly improving. She admits today however to being very depressed. She states she has been on Prozac and Zoloft in the past and would like to try something different.  The patient also complains of both diarrhea and constipation over the last couple of weeks.  She states that currently her stools are soft and she has movements every other day.  Over the weekend she had nausea and vomiting with whatever she would consume.  She says that she takes nausea medication regularly, 4 times per day   She has a PET scan scheduled the 29th of July.  As far as her medication list: She is  not on a PPI Zofran makes her immediately vomit. She does not need a refill of her EMLA cream  She would like an Ativan refill  Flexeril, 2x /day  Fentanyl, wants to lower dosage from 100 to 75 Hydrocodone, helps with relaxation and her throat pain, she is down to 2x a day  She says that Lorazepam doesn't work Prochlorperazine helps her nausea best She is on nothing for stomach acid, has had Carafate and would like a refille     Malignant neoplasm of base of tongue   08/20/2014 Surgery Diagnosis 1. Tongue, biopsy, left base/ pharynx mass - INVASIVE MODERATELY DIFFERENTIATED SQUAMOUS CELL CARCINOMA. - SEE COMMENT. 2. Lymph nodes, radical neck dissection, Left - THREE OF SIXTEEN LYMPH NODES POSITIVE FOR METASTATIC SQUAMOUS CELL [facility]   09/18/2014 Surgery Multiple extraction of tooth numbers 2, 14, 15, 18, and 31.  4 Quadrants of alveoloplasty.  Gross debridement of remaining dentition. Mandibular left lingual torus reduction   10/08/2014 - 11/19/2014 Chemotherapy Cisplatin every 21 days   10/09/2014 - 11/30/2014 Radiation Therapy [name] with twice daily dosing at the end of treatment at patient's request due to trip to [facility] against medical advice.     Past Medical History  Diagnosis Date  . Heart murmur     as a child  . Chronic bronchitis   . Anxiety     panic attacks  . Depression   . Headache     onset a few months ago  . Neuropathy     had  it in both hands  . Arthritis   . Fibromyalgia   . 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 Anemia   . Rosacea   . Hypercholesterolemia   . Asthma   . COPD (chronic obstructive pulmonary disease)   . Seizures     takes Gabapentin  (last one in 2010)    has Malignant neoplasm of pharynx; Malignant neoplasm of base of tongue; Recurrent cold sores; Therapeutic opioid induced constipation; and Chronic periodontitis on her problem list.     is allergic to bee venom; penicillins; and latex.  We  administered heparin lock flush and sodium chloride.  Past Surgical History  Procedure Laterality Date  . Wisdom tooth extraction    . Tonsillectomy Left 08/20/2014    Procedure: TONSILLECTOMY;  Surgeon: Ruby Cola, MD;  Location: [facility] OR;  Service: ENT;  Laterality: Left;  . Radical neck dissection Left 08/20/2014    Procedure: RADICAL LEFT NECK DISSECTION ;  Surgeon: Ruby Cola, MD;  Location: [place];  Service: ENT;  Laterality: Left;  . Tracheostomy tube placement N/A 08/20/2014    Procedure: TRACHEOSTOMY - AWAKE;  Surgeon: Ruby Cola, MD;  Location: [place];  Service: ENT;  Laterality: N/A;  . Gastrostomy tube placement    . Multiple extractions with alveoloplasty N/A 09/17/2014    Procedure: Extraction of tooth #'s [phone] with alveoloplasty, mandibular left lingual torus reduction, and gross debridement of remaining teeth.;  Surgeon: Lenn Cal, DDS;  Location: [place];  Service: Oral Surgery;  Laterality: N/A;    Pt reports congestion. Pt reports nausea and vomiting from radiation related secretions. 14 point review of systems is performed and is negative except as detailed in history of present illness 14 point review of systems was performed and is negative except as detailed under history of present illness and above    PHYSICAL EXAMINATION  ECOG PERFORMANCE STATUS: 1 - Symptomatic but completely ambulatory  Filed Vitals:   01/19/15 1407  BP: 132/84  Pulse: 92  Temp: 99.1 F (37.3 C)  Resp: 18    GENERAL:alert, no distress, well nourished, well developed, comfortable, in chemotherapy chair SKIN: skin color, texture, turgor are normal, no rashes or significant lesions. Bilateral neck, anterior neck erythema HEAD: Normocephalic, No masses, lesions, tenderness or abnormalities MOUTH: healing well, no thick secretions, fairly good saliva production EYES: normal, PERRLA, EOMI, Conjunctiva are pink and non-injected EARS: External ears normal OROPHARYNX:lips,  buccal mucosa, and tongue normal and mucous membranes are moist Thick secretions noted, erythema from XRT noted.  NECK: supple, trachea midline, tracheostomy noted, yellow to clear secretions bilateral skin erythema She is taking care of her skin.  It is soft. She has good range of motion LYMPH:  no palpable lymphadenopathy BREAST:not examined LUNGS: clear to auscultation and percussion, rare rhonchi HEART: regular rhythm, tachycardic ABDOMEN:non-tender PEG site C/D/I BACK: Back symmetric, no curvature. EXTREMITIES:less then 2 second capillary refill, no skin discoloration  NEURO: alert & oriented x 3 with fluent speech, gait normal (she is hoarse)   LABORATORY DATA: CBC    Component Value Date/Time   WBC 8.8 01/19/2015 1515   WBC 11.3* 09/02/2014 1007   RBC 3.08* 01/19/2015 1515   RBC 3.76 09/02/2014 1007   HGB 9.6* 01/19/2015 1515   HGB 11.1* 09/02/2014 1007   HCT 28.3* 01/19/2015 1515   HCT 34.8 09/02/2014 1007   PLT 219 01/19/2015 1515   PLT 396 09/02/2014 1007   MCV  91.9 01/19/2015 1515   MCV 92.6 09/02/2014 1007   MCH 31.2 01/19/2015 1515   MCH 29.5 09/02/2014 1007   MCHC 33.9 01/19/2015 1515   MCHC 31.9 09/02/2014 1007   RDW 17.7* 01/19/2015 1515   RDW 15.4* 09/02/2014 1007   LYMPHSABS 0.6* 01/19/2015 1515   LYMPHSABS 1.5 09/02/2014 1007   MONOABS 0.7 01/19/2015 1515   MONOABS 1.1* 09/02/2014 1007   EOSABS 0.1 01/19/2015 1515   EOSABS 0.2 09/02/2014 1007   BASOSABS 0.0 01/19/2015 1515   BASOSABS 0.0 09/02/2014 1007      Chemistry      Component Value Date/Time   NA 130* 01/19/2015 1515   NA 137 09/02/2014 1007   K 4.1 01/19/2015 1515   K 4.5 09/02/2014 1007   CL 90* 01/19/2015 1515   CO2 29 01/19/2015 1515   CO2 27 09/02/2014 1007   BUN 48* 01/19/2015 1515   BUN 10.7 09/02/2014 1007   CREATININE 2.14* 01/19/2015 1515   CREATININE 0.8 09/02/2014 1007      Component Value Date/Time   CALCIUM 9.3 01/19/2015 1515   CALCIUM 10.1 09/02/2014 1007    ALKPHOS 69 01/19/2015 1515   AST 17 01/19/2015 1515   ALT 7* 01/19/2015 1515   BILITOT 0.6 01/19/2015 1515       ASSESSMENT AND PLAN:  T2N2cMx HPV negative moderately differentiated Stage IVA Squamous cell carcinoma, base of tongue Mucositis Nausea Weight loss, > 10% body weight Anxiety Anemia Elevated creatinine  I spoke with Daphne, speech therapy today.  She states that the patient can eat whatever she wishes at this point.  Malavika complains that food has no taste, and because of that the smell of food and eating make her nauseated.  She is only using 5 cans daily via PEG. Ovid Curd, our nutritionist has met with the patient today and encouraged either trying to increase oral intake or increasing her TF. She will return in 2 weeks for a repeat visit and weight recheck. The nausea does not improve I may refer her for an EGD. We will reassess this at follow-up.  She remains anemic and this may be nutritional and posttreatment related. This will need to be closely monitored moving forward.  I am referring her in to the [facility].  Her creatinine is elevated today and this may be from dehydration slide asked her to push fluids. We will follow this moving forward as well. It may also be treatment related given her struggles with nutritional status/hydration throughout her chemotherapy.  We will get her back to see her ENT physician Dr. Simeon Craft sooner than October. Daphne would like to see if the patient can try a trial to wean her off her trach.  I begun to wean her off some of her pain medications. I think this may also help her appetite. I discussed this with her in detail. I have refilled her other medications as requested. I have called in a prescription for Lexapro. I will monitor her mood moving forward.   All questions were answered. The patient knows to call the clinic with any problems, questions or concerns. We can certainly see the patient much sooner if necessary.   This  document serves as a record of services personally performed by Ancil Linsey, MD. It was created on her behalf by Janace Hoard, a trained medical scribe. The creation of this record is based on the scribe's personal observations and the provider's statements to them. This document has been checked and approved by the  attending provider.  I have reviewed the above documentation for accuracy and completeness, and I agree with the above.  Kelby Fam. Johniya Durfee MD

## 2015-01-19 NOTE — Progress Notes (Signed)
Shelby Larson presented for labwork. Labs per MD order drawn via Portacath located in the right chest wall accessed with  H 20 needle. Good blood return present. Procedure without incident.  Portacath flushed with 63ml NS and 500U/26ml Heparin per protocol and needle removed intact. Patient tolerated procedure well.

## 2015-01-19 NOTE — Patient Instructions (Signed)
[place] at [facility] Discharge Instructions  RECOMMENDATIONS MADE BY THE CONSULTANT AND ANY TEST RESULTS WILL BE SENT TO YOUR REFERRING PHYSICIAN.  Exam and discussion by Dr. Whitney Muse.  Prescriptions given: Lexapro take 1/2 tablet for 5 days then increase to one tablet daily Fentanyl patches - use as directed every 3 days Hydrocodone - take as directed Lorazepam - take as directed Prochlorperazine (Compazine) - take as directed Carafate - take as directed. Will refer you back to Dr. Simeon Craft for tracheostomy evaluation Will make [facility] Referral Will check labs today Call with any concerns.  Follow-up in 2 weeks with office visit.   Thank you for choosing [place] at [facility] to provide your oncology and hematology care.  To afford each patient quality time with our provider, please arrive at least 15 minutes before your scheduled appointment time.    You need to re-schedule your appointment should you arrive 10 or more minutes late.  We strive to give you quality time with our providers, and arriving late affects you and other patients whose appointments are after yours.  Also, if you no show three or more times for appointments you may be dismissed from the clinic at the providers discretion.     Again, thank you for choosing [facility].  Our hope is that these requests will decrease the amount of time that you wait before being seen by our physicians.       _____________________________________________________________  Should you have questions after your visit to [facility], please contact our office at [phone] between the hours of 8:30 a.m. and 4:30 p.m.  Voicemails left after 4:30 p.m. will not be returned until the following business day.  For prescription refill requests, have your pharmacy contact our office.

## 2015-01-19 NOTE — Progress Notes (Signed)
Please see doctors encounter for more information 

## 2015-01-19 NOTE — Progress Notes (Signed)
Patient identified to be at risk for malnutrition on the MST secondary to further weight loss  Contacted Pt by Visiting her during appointment  Wt Readings from Last 10 Encounters:  01/19/15 102 lb 14.4 oz (46.675 kg)  12/22/14 110 lb (49.896 kg)  12/21/14 109 lb 9.6 oz (49.714 kg)  11/25/14 104 lb 3.2 oz (47.265 kg)  11/19/14 104 lb 12.8 oz (47.537 kg)  11/13/14 107 lb 8 oz (48.762 kg)  11/06/14 111 lb 8 oz (50.576 kg)  10/29/14 114 lb 3.2 oz (51.801 kg)  10/22/14 113 lb 11.2 oz (51.574 kg)  10/14/14 118 lb 4.8 oz (53.661 kg)   Initially, after I spoke with her on 6/16 she start to gain weight. However she has now dropped weight again  Pt reports being compliant with her TF, even on her trips. She states she is taking 5 cans Jevity 1.2. However, the last time I spoke with her I changed her TF to 5 cans of Osmolite 1.5. I contacted [facility] and they stated pt was receving Jevity 1.5 not Osmolite 1.5. Upon further investigation, her insurance denied Osmolite 1.5  I believe I recall she has mentioned in the past that she does have jevity 1.2 at her house. If she was using this instead of the Jevity 1.5 she would be receiving 1422 kcals  and 66 g Pro which is 80% of the calories and  87% of the protein she should have been receiving.   However, it was more likely she just misspoke and was using 1.5.   Patient reports oral intake as poor and is suffering from symptoms including lack of taste and lack of appetite.   She reports having instances of diarrhea and constipation. She says right now she is having a lose BM every few days which seems fairly infrequent if she is indeed doing her full TF regimen. She also has nausea and vomiting upon eating and is difficult to manage with her current medication. Depression has also been a struggle for her and this may well be playing a part in her weight loss as well. MD to work with her on this.  She is able to tolerate some liquids. She reports she drinks  some juice, coffee and some bites of more solid food like mac and cheese/crispy cream donuts.   Her biggest issue is her lack of taste. She really struggles to eat when she cant taste the food. She states she can taste the really sweet foods, but not much else. She doesn't have any desire to do Ensure orally.  Discussed with pt that she cannot continue to lose weight. Ideally, her oral intake would increase, but if she is unable to do this we should increase the amount of calories/protein provided by her tube feed.   MD suggested letting patient try to eat for the next couple weeks and come back in for a re-weight to see if she can meet her needs with her oral intake. If not, her TF may need to be increased.   Burtis Junes RD, LDN Nutrition Pager: [phone] 01/19/2015 2:42 PM

## 2015-01-20 ENCOUNTER — Encounter: Payer: Self-pay | Admitting: Dietician

## 2015-01-20 ENCOUNTER — Other Ambulatory Visit (HOSPITAL_COMMUNITY): Payer: Self-pay

## 2015-01-20 ENCOUNTER — Ambulatory Visit (HOSPITAL_COMMUNITY): Payer: Self-pay | Admitting: Hematology & Oncology

## 2015-01-20 MED ORDER — JEVITY 1.5 CAL/FIBER PO LIQD: ORAL | Status: AC

## 2015-01-20 NOTE — Progress Notes (Addendum)
Follow up from yesterday.   I contacted one of the Dietitians at [facility]. She reports that the pt had been switched to a regimen of a nocturnal feed of Jevity 1.5 70 mls/hr for 12 hours. This order came from her admission when she was on vacation in [facility], She was admitted for ARF 2/2 to dehydration and was d/c on this low feeding TF (1260 kcals).  Mrs. Steely reported she was drinking ensure and maybe The dietitian tried to incorporate this while determining the appropriate amount of TF she would need? In any event, it is very unfortunate that this order overrode the one that adequately met her needs.   Yesterday pt stated she was doing 5 cans of Jevity 1.2. I called pt today to clarify. She said that she did misspeak yesterday; she is using 5 cans of Jevity 1.5 and runs the TF until all the formula is gone (n the SLT's note, pt had reported she was doing 100 mls/hr). Fortunately, though pt's formula was switched, she did not use the low rate that [facility] discharged her with.   I asked if she would like to switch back to the Osmolite 1.5 as it has less fiber and may cause less gas, or abdominal pains if she is having them etc. However, she states her bowel movemens have gotten less frequent with the Jevity 1.5. She would like to stay with this.   She states she had dinner last night. It "smelled awful" and had no taste, but was able to tolerate it okay.   Continued to encourage her to eat.   Will place order for 5 cans Jevity 1.5.  Burtis Junes RD, LDN Nutrition Pager: [phone] 01/20/2015 11:21 AM

## 2015-02-05 ENCOUNTER — Encounter (HOSPITAL_COMMUNITY): Payer: 59 | Attending: Hematology & Oncology | Admitting: Hematology & Oncology

## 2015-02-05 VITALS — BP 106/69 | HR 76 | Temp 98.7°F | Resp 20 | Wt 103.2 lb

## 2015-02-05 DIAGNOSIS — Z87891 Personal history of nicotine dependence: Secondary | ICD-10-CM | POA: Insufficient documentation

## 2015-02-05 DIAGNOSIS — N189 Chronic kidney disease, unspecified: Secondary | ICD-10-CM

## 2015-02-05 DIAGNOSIS — D649 Anemia, unspecified: Secondary | ICD-10-CM

## 2015-02-05 DIAGNOSIS — F329 Major depressive disorder, single episode, unspecified: Secondary | ICD-10-CM

## 2015-02-05 DIAGNOSIS — R634 Abnormal weight loss: Secondary | ICD-10-CM | POA: Diagnosis not present

## 2015-02-05 DIAGNOSIS — C760 Malignant neoplasm of head, face and neck: Secondary | ICD-10-CM | POA: Diagnosis not present

## 2015-02-05 DIAGNOSIS — F32A Depression, unspecified: Secondary | ICD-10-CM

## 2015-02-05 DIAGNOSIS — C01 Malignant neoplasm of base of tongue: Secondary | ICD-10-CM | POA: Insufficient documentation

## 2015-02-05 NOTE — Patient Instructions (Addendum)
[place] at [facility] Discharge Instructions  RECOMMENDATIONS MADE BY THE CONSULTANT AND ANY TEST RESULTS WILL BE SENT TO YOUR REFERRING PHYSICIAN.  You will return to [place] in three weeks for an appointment.  We have provided you with a prescription for Xeroform.  Please see the schedule for your appointments.    Thank you for choosing [place] at [facility] to provide your oncology and hematology care.  To afford each patient quality time with our provider, please arrive at least 15 minutes before your scheduled appointment time.    You need to re-schedule your appointment should you arrive 10 or more minutes late.  We strive to give you quality time with our providers, and arriving late affects you and other patients whose appointments are after yours.  Also, if you no show three or more times for appointments you may be dismissed from the clinic at the providers discretion.     Again, thank you for choosing [facility].  Our hope is that these requests will decrease the amount of time that you wait before being seen by our physicians.       _____________________________________________________________  Should you have questions after your visit to [facility], please contact our office at [phone] between the hours of 8:30 a.m. and 4:30 p.m.  Voicemails left after 4:30 p.m. will not be returned until the following business day.  For prescription refill requests, have your pharmacy contact our office.

## 2015-02-05 NOTE — Progress Notes (Signed)
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 She has completed concurrent XRT/cisplatin. She continues to have problems with nutrition and weight loss. She is otherwise doing quite well.   The patient has had her tracheostomy removed.  She not not very active.  Taste of food is still an issue. She refuses to eat by mouth because she can find nothing that tastes good. She drinks some but still takes most of her nutrition and liquids via PEG tube. She currently weighs 103 pounds. On 09/02/2014 the patient weighed 122.5.   She is not sure if her mood has improved but agrees that life is worth living. At her last visit we discussed and changed her anti-depressing take as she admitted to being depressed. The patient says that she thinks a lot now and does not sleep much.    She has an appointment with the [facility] [facility] on 02/17/15.    Malignant neoplasm of base of tongue   08/20/2014 Surgery Diagnosis 1. Tongue, biopsy, left base/ pharynx mass - INVASIVE MODERATELY DIFFERENTIATED SQUAMOUS CELL CARCINOMA. - SEE COMMENT. 2. Lymph nodes, radical neck dissection, Left - THREE OF SIXTEEN LYMPH  NODES POSITIVE FOR METASTATIC SQUAMOUS CELL [facility]   09/18/2014 Surgery Multiple extraction of tooth numbers 2, 14, 15, 18, and 31.  4 Quadrants of alveoloplasty.  Gross debridement of remaining dentition. Mandibular left lingual torus reduction   10/08/2014 - 11/19/2014 Chemotherapy Cisplatin every 21 days   10/09/2014 - 11/30/2014 Radiation Therapy Dr. Isidore Larson with twice daily dosing at the end of treatment at patient's request due to trip to [facility] against medical advice.     Past Medical History  Diagnosis Date  . Heart murmur     as a child  . Chronic bronchitis   . Anxiety     panic attacks  . Depression   . Headache     onset a few months ago  . Neuropathy     had it in both hands  . Arthritis   . Fibromyalgia   . 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 Anemia   . Rosacea   . Hypercholesterolemia   . Asthma   . COPD (chronic obstructive pulmonary disease)   . Seizures     takes Gabapentin  (last one in 2010)    has Malignant neoplasm of pharynx; Malignant neoplasm of base of tongue; Recurrent cold sores; Therapeutic opioid induced constipation; and Chronic periodontitis on her problem list.     is allergic to bee venom; penicillins; and latex.  [name] had no medications administered during this visit.  Past Surgical History  Procedure Laterality Date  . Wisdom tooth extraction    .  Tonsillectomy Left 08/20/2014    Procedure: TONSILLECTOMY;  Surgeon: Shelby Cola, MD;  [facility] OR;  Service: ENT;  Laterality: Left;  . Radical neck dissection Left 08/20/2014    Procedure: RADICAL LEFT NECK DISSECTION ;  Surgeon: Shelby Cola, MD;  Location: [place];  Service: ENT;  Laterality: Left;  . Tracheostomy tube placement N/A 08/20/2014    Procedure: TRACHEOSTOMY - AWAKE;  Surgeon: Shelby Cola, MD;  Location: [place];  Service: ENT;  Laterality: N/A;  . Gastrostomy tube placement    . Multiple extractions with alveoloplasty N/A  09/17/2014    Procedure: Extraction of tooth #'s [id number] with alveoloplasty, mandibular left lingual torus reduction, and gross debridement of remaining teeth.;  Surgeon: Shelby Larson, DDS;  Location: [place];  Service: Oral Surgery;  Laterality: N/A;    Pt reports congestion. Pt reports nausea and vomiting from radiation related secretions. 14 point review of systems is performed and is negative except as detailed in history of present illness 14 point review of systems was performed and is negative except as detailed under history of present illness and above    PHYSICAL EXAMINATION  ECOG PERFORMANCE STATUS: 1 - Symptomatic but completely ambulatory  Filed Vitals:   02/05/15 1018  BP: 106/69  Pulse: 76  Temp: 98.7 F (37.1 C)  Resp: 20    GENERAL:alert, no distress, well nourished, well developed, comfortable, in chemotherapy chair SKIN: skin color, texture, turgor are normal, no rashes or significant lesions. Bilateral neck, anterior neck erythema HEAD: Normocephalic, No masses, lesions, tenderness or abnormalities MOUTH: Well-healed oral mucosa., Moist EYES: normal, PERRLA, EOMI, Conjunctiva are pink and non-injected EARS: External ears normal OROPHARYNX:lips, buccal mucosa, and tongue normal and mucous membranes are moist minimal erythema  NECK: supple, trachea midline, tracheostomy noted, yellow to clear secretions bilateral skin erythema She is taking care of her skin.  It is soft. She has good range of motion LYMPH:  no palpable lymphadenopathy BREAST:not examined LUNGS: clear to auscultation and percussion, rare rhonchi HEART: regular rhythm, tachycardic ABDOMEN:non-tender PEG site C/D/I BACK: Back symmetric, no curvature. EXTREMITIES:less then 2 second capillary refill, no skin discoloration  NEURO: alert & oriented x 3 with fluent speech, gait normal    LABORATORY DATA: I have reviewed his laboratory data listed below Results for Shelby, Larson (MRN  [id number])   Ref. Range 01/19/2015 15:15  WBC Latest Ref Range: 4.0-10.5 K/uL 8.8  RBC Latest Ref Range: 3.87-5.11 MIL/uL 3.08 (L)  Hemoglobin Latest Ref Range: 12.0-15.0 g/dL 9.6 (L)  HCT Latest Ref Range: 36.0-46.0 % 28.3 (L)  MCV Latest Ref Range: 78.0-100.0 fL 91.9  MCH Latest Ref Range: 26.0-34.0 pg 31.2  MCHC Latest Ref Range: 30.0-36.0 g/dL 33.9  RDW Latest Ref Range: 11.5-15.5 % 17.7 (H)  Platelets Latest Ref Range: 150-400 K/uL 219  Neutrophils Latest Ref Range: 43-77 % 85 (H)  Lymphocytes Latest Ref Range: 12-46 % 6 (L)  Monocytes Relative Latest Ref Range: 3-12 % 8  Eosinophil Latest Ref Range: 0-5 % 1  Basophil Latest Ref Range: 0-1 % 0  NEUT# Latest Ref Range: 1.7-7.7 K/uL 7.5  Lymphocyte # Latest Ref Range: 0.7-4.0 K/uL 0.6 (L)  Monocyte # Latest Ref Range: 0.1-1.0 K/uL 0.7  Eosinophils Absolute Latest Ref Range: 0.0-0.7 K/uL 0.1  Basophils Absolute Latest Ref Range: 0.0-0.1 K/uL 0.0   Results for RAEL, TILLY (MRN [id number])   Ref. Range 01/19/2015 15:15  Sodium Latest Ref Range: 135-145 mmol/L 130 (L)  Potassium Latest Ref Range: 3.5-5.1 mmol/L  4.1  Chloride Latest Ref Range: 101-111 mmol/L 90 (L)  CO2 Latest Ref Range: 22-32 mmol/L 29  BUN Latest Ref Range: 6-20 mg/dL 48 (H)  Creatinine Latest Ref Range: 0.44-1.00 mg/dL 2.14 (H)  Calcium Latest Ref Range: 8.9-10.3 mg/dL 9.3  EGFR (Non-African Amer.) Latest Ref Range: >60 mL/min 26 (L)  EGFR (African American) Latest Ref Range: >60 mL/min 30 (L)  Glucose Latest Ref Range: 65-99 mg/dL 92  Anion gap Latest Ref Range: 5-15  11  Alkaline Phosphatase Latest Ref Range: 38-126 U/L 69  Albumin Latest Ref Range: 3.5-5.0 g/dL 3.9  AST Latest Ref Range: 15-41 U/L 17  ALT Latest Ref Range: 14-54 U/L 7 (L)  Total Protein Latest Ref Range: 6.5-8.1 g/dL 7.4  Total Bilirubin Latest Ref Range: 0.3-1.2 mg/dL 0.6  PREALBUMIN Latest Ref Range: 18-38 mg/dL 33.3   ASSESSMENT AND PLAN:  T2N2cMx HPV negative moderately  differentiated Stage IVA Squamous cell carcinoma, base of tongue Mucositis Nausea Weight loss, > 10% body weight Anxiety Anemia Elevated creatinine Tracheostomy removal  Her trach has been removed. She is pleased with that. I continue to emphasize the importance of trying to get her to use her oral pharyngeal muscles. I emphasized to her that we really need to get her to start eating by mouth. Her anemia continues to be quite prominent and at this point I am sure her kidney injury from treatment and dehydration is contributing however this will have to be closely monitored. The patient may need additional anemia evaluation including GI consultation.  I begun to wean her off some of her pain medications. I think this may also help her appetite. I discussed this with her in detail. I have refilled her other medications as requested.   Her mood is improved and she is continue on Lexapro. I have discussed with her counseling.  All questions were answered. The patient knows to call the clinic with any problems, questions or concerns. We can certainly see the patient much sooner if necessary.   This document serves as a record of services personally performed by Ancil Linsey, MD. It was created on her behalf by Janace Hoard, a trained medical scribe. The creation of this record is based on the scribe's personal observations and the provider's statements to them. This document has been checked and approved by the attending provider.  I have reviewed the above documentation for accuracy and completeness, and I agree with the above.  Kelby Fam. Penland MD

## 2015-02-17 ENCOUNTER — Ambulatory Visit (HOSPITAL_COMMUNITY): Payer: 59

## 2015-02-17 ENCOUNTER — Encounter (HOSPITAL_COMMUNITY): Payer: Self-pay

## 2015-02-17 ENCOUNTER — Ambulatory Visit (HOSPITAL_COMMUNITY): Payer: 59 | Admitting: Physical Therapy

## 2015-02-17 ENCOUNTER — Ambulatory Visit (HOSPITAL_COMMUNITY): Payer: 59 | Attending: Oncology | Admitting: Physical Therapy

## 2015-02-17 DIAGNOSIS — R6889 Other general symptoms and signs: Secondary | ICD-10-CM | POA: Diagnosis present

## 2015-02-17 DIAGNOSIS — R2681 Unsteadiness on feet: Secondary | ICD-10-CM | POA: Diagnosis present

## 2015-02-17 DIAGNOSIS — R29898 Other symptoms and signs involving the musculoskeletal system: Secondary | ICD-10-CM

## 2015-02-17 DIAGNOSIS — M6289 Other specified disorders of muscle: Secondary | ICD-10-CM | POA: Insufficient documentation

## 2015-02-17 DIAGNOSIS — C140 Malignant neoplasm of pharynx, unspecified: Secondary | ICD-10-CM | POA: Diagnosis present

## 2015-02-17 DIAGNOSIS — C029 Malignant neoplasm of tongue, unspecified: Secondary | ICD-10-CM

## 2015-02-17 DIAGNOSIS — M6281 Muscle weakness (generalized): Secondary | ICD-10-CM

## 2015-02-17 NOTE — Patient Instructions (Signed)
Heel Raises   Stand with support. Tighten pelvic floor and hold. With knees straight, raise heels off ground. Hold _0__ seconds.  Repeat __15_ times. Do _2__ times a day.  Copyright  VHI. All rights reserved.      HIP ABDUCTION AT COUNTER   While standing up using a walker, raise your leg out to the side. Keep your knee straight and maintain your toes pointed forward the entire time.   Use your arms for support and balance and have a chair behind you for safety.  Repeat 10 times each leg, 2x/day.      HIP EXTENSIONS AT COUNTER  While standing up using a walker, extend your leg behind you. Repeat 10 times, 2x/day.     STANDING MARCHING  While standing, draw up your knee, set it down and then alternate to your other side. Go quickly but not so quickly that you lose your balance- be safe with the exercise.   Use your arms for support if needed for balance and safety.   Perform for at least 2-3 minutes, 2-3 times per day.

## 2015-02-17 NOTE — Therapy (Signed)
[place], [place], [address] Phone: [phone]   Fax:  [phone]  Physical Therapy Evaluation  Patient Details  Name: Shelby Larson MRN: [id number] Date of Birth: 07-27-1965 Referring Provider:  Patrici Ranks, MD  Encounter Date: 02/17/2015      PT End of Session - 02/17/15 1432    Visit Number 1   Number of Visits 12   Authorization Type UHC (23 visit limit for PT)   Authorization Time Period 02/17/15 to 04/19/15   PT Start Time 3664   PT Stop Time 1428   PT Time Calculation (min) 40 min   Activity Tolerance Patient tolerated treatment well   Behavior During Therapy [facility] for tasks assessed/performed      Past Medical History  Diagnosis Date  . Heart murmur     as a child  . Chronic bronchitis   . Anxiety     panic attacks  . Depression   . Headache     onset a few months ago  . Neuropathy     had it in both hands  . Arthritis   . Fibromyalgia   . 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 Anemia   . Rosacea   . Hypercholesterolemia   . Asthma   . COPD (chronic obstructive pulmonary disease)   . Seizures     takes Gabapentin  (last one in 2010)    Past Surgical History  Procedure Laterality Date  . Wisdom tooth extraction    . Tonsillectomy Left 08/20/2014    Procedure: TONSILLECTOMY;  Surgeon: Ruby Cola, MD;  Location: [facility] OR;  Service: ENT;  Laterality: Left;  . Radical neck dissection Left 08/20/2014    Procedure: RADICAL LEFT NECK DISSECTION ;  Surgeon: Ruby Cola, MD;  Location: [place];  Service: ENT;  Laterality: Left;  . Tracheostomy tube placement N/A 08/20/2014    Procedure: TRACHEOSTOMY - AWAKE;  Surgeon: Ruby Cola, MD;  Location: [place];  Service: ENT;  Laterality: N/A;  . Gastrostomy tube placement    . Multiple extractions with alveoloplasty N/A 09/17/2014    Procedure: Extraction of tooth #'s [id number] with  alveoloplasty, mandibular left lingual torus reduction, and gross debridement of remaining teeth.;  Surgeon: Lenn Cal, DDS;  Location: [place];  Service: Oral Surgery;  Laterality: N/A;    There were no vitals filed for this visit.  Visit Diagnosis:  Tongue cancer - Plan: PT plan of care cert/re-cert  Pharynx cancer - Plan: PT plan of care cert/re-cert  Proximal muscle weakness - Plan: PT plan of care cert/re-cert  Weakness of both lower extremities - Plan: PT plan of care cert/re-cert  Decreased functional activity tolerance - Plan: PT plan of care cert/re-cert  Unsteadiness - Plan: PT plan of care cert/re-cert      Subjective Assessment - 02/17/15 1350    Subjective Patient feels very tired and has pain on an average day; reports she is typically very tired and falls asleep quickly during tasks. Does not report any majopr balance issues.    Pertinent History Patient was diagnosed with tongue and pharynx cancer in February; had PEG and Portacath put in, also had trach. Has been getting chemo and radiation for her cancer, reports that hopefully she is done with these treatments. Reports that she is still not feeling good after her treatemnts even though the last one was around a month ago.  Patient Stated Goals get feeling better, get back up to par physically    Currently in Pain? Yes   Pain Score 8    Pain Location Back  trunk front and back             [facility] PT Assessment - 02/17/15 0001    Assessment   Medical Diagnosis tongue and pharynx cancer    Onset Date/Surgical Date --  February 2016   Next MD Visit --  tomorrow with Dr. Whitney Muse    Precautions   Precaution Comments still has PEG and Portacath    Restrictions   Weight Bearing Restrictions No   Balance Screen   Has the patient fallen in the past 6 months Yes   How many times? 1- fell down stairs, just slipped    Has the patient had a decrease in activity level because of a fear of falling?  Yes   Is  the patient reluctant to leave their home because of a fear of falling?  No   Prior Function   Level of Independence Independent;Independent with basic ADLs;Independent with gait;Independent with transfers   Vocation Full time employment   Vocation Requirements worked at [employer]    Observation/Other Assessments   Observations SLS 4 seconds L, 13 seconds R    Posture/Postural Control   Posture Comments flexed at hips, does nota ppear to have good nutrition,forward head with B IR shoulders   AROM   Right Hip External Rotation  --  WFL    Right Hip Internal Rotation  44   Left Hip External Rotation  --  WFL    Left Hip Internal Rotation  45   Strength   Right Hip Flexion 4-/5   Right Hip Extension --  not tested due to PEG/Portacath    Right Hip ABduction 4-/5   Left Hip Flexion 4-/5   Left Hip Extension --  not tseted due to PEG/Portacath    Left Hip ABduction 3+/5   Right Knee Flexion 4-/5   Right Knee Extension 4+/5   Left Knee Flexion 3+/5   Left Knee Extension 4+/5   Right Ankle Dorsiflexion 4+/5   Left Ankle Dorsiflexion 4+/5   Ambulation/Gait   Gait Comments proximal muscle weakness, flexed at hips, foot pronation    6 minute walk test results    Endurance additional comments 1216ft in 6MWT, 1.78m/s    Timed Up and Go Test   TUG Comments TUG 8.3, 8.3, 8.4                            PT Education - 02/17/15 1431    Education provided Yes   Education Details prognosis, plan of care, HEP    Person(s) Educated Patient   Methods Explanation   Comprehension Verbalized understanding          PT Short Term Goals - 02/17/15 1440    PT SHORT TERM GOAL #1   Title Patient will experiecne no more than 5/10 pain and no more than 5/10 fatigue during all functional tasks and activities of at leaset 60 minutes in duration    Time 3   Period Weeks   Status New   PT SHORT TERM GOAL #2   Title Patient will be able to maintain SLS on each leg for at least  20 seconds with no HHA    Time 3   Period Weeks   Status New   PT SHORT TERM  GOAL #3   Title Patient will be independent in correctly and consistently performing appropriate HEP, to be updated PRN    Time 3   Period Weeks   Status New           PT Long Term Goals - 02/17/15 1446    PT LONG TERM GOAL #1   Title Patient will demonstarte 5/5 strength in bilateral lower extremties and at least 4/5 strength in proximal muscles and core    Time 6   Period Weeks   Status New   PT LONG TERM GOAL #2   Title Patient will demonstrate improved functional activity tolerance as evidenced by an abiltiy to tolerate at least 15 minutes of exercise on Nustep on at least level 4    Time 6   Period Weeks   Status New   PT LONG TERM GOAL #3   Title Patient will be able to maintain SLS on each leg for at least 45 seconds    Time 6   Period Weeks   Status New   PT LONG TERM GOAL #4   Title Patient to report she is experiencing no more than 3/10 pain and 3/10 fatigue during functional tasks and activities of at least 90 minutes in duration    Time 6   Period Weeks   Status New               Plan - 02/17/15 1434    Clinical Impression Statement Patient presents status post diagnosis and treatment of tongue and pharyx cancer, for which she received radiation and chemo treatments; she reports that she is still feeling bad from the treatments even though her last one was almost a month ago. She demonstrates some impairments in strength and balance, as well as functional activity tolerance, and has pain at this time, and reports that one of her main complaints right now is just being very tired all the time. She also reports that she recently had a period for one day recently after not having one for 7 years , and was encouraged to tell her MD about this. AT this time patient may benefit from skilled PT services in order to address her functional impairments and to assist her in reaching an optimal  level of function.       Pt will benefit from skilled therapeutic intervention in order to improve on the following deficits Decreased endurance;Decreased activity tolerance;Decreased strength;Pain;Decreased balance;Decreased coordination;Postural dysfunction   Rehab Potential Good   PT Frequency 2x / week   PT Duration 6 weeks   PT Treatment/Interventions ADLs/Self Care Home Management;Gait training;Stair training;Functional mobility training;Therapeutic activities;Therapeutic exercise;Balance training;Neuromuscular re-education;Patient/family education;Manual techniques   PT Next Visit Plan review HEP and goals; functional strength and balance, functional activity tolerance    PT Home Exercise Plan given    Consulted and Agree with Plan of Care Patient         Problem List Patient Active Problem List   Diagnosis Date Noted  . 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 Therapeutic opioid induced constipation 09/16/2014  . 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 Malignant neoplasm of base of tongue 09/02/2014  . Malignant neoplasm of pharynx 08/20/2014    Deniece Ree PT, DPT [place] [address] [place], [place], [address] Phone: [phone]   Fax:  [phone]

## 2015-02-17 NOTE — Patient Instructions (Signed)
Home Exercises Program Theraputty Exercises  Do the following exercises several times a day/as many times as you feel you need to. 1. Roll putty into a ball.  2. Make into a pancake.  3. Roll putty into a roll.  4. Pinch along log with first finger and thumb.   5. Make into a ball.  6. Roll it back into a log.   7. Pinch using thumb and side of first finger.  8. Roll into a ball, then flatten into a pancake.  9. Using your fingers, make putty into a mountain.   Rubber Bands Use household items like rubber bands to increase finger strength. Place a rubber band around your fingers and thumb. Slowly spread your fingers and thumb away from each other until you feel tension. Return to the starting position and repeat for one to two minutes. Place a small rubber band around your thumb and index finger and stretch them apart until you feel resistance. Return to the starting position and repeat for one minute. This technique will specifically strengthen your thumb muscles.

## 2015-02-18 NOTE — Therapy (Signed)
[place], [place], [address] Phone: [phone]   Fax:  [phone]  Occupational Therapy Evaluation  Patient Details  Name: Shelby Larson MRN: [id number] Date of Birth: 10-Jun-1966 Referring Provider:  Patrici Ranks, MD  Encounter Date: 02/17/2015      OT End of Session - 02/17/15 1520    Visit Number 1   Number of Visits 1   Authorization Type UHC   Authorization Time Period 23 visit limit for OT   Authorization - Visit Number 1   Authorization - Number of Visits 23   OT Start Time 9371   OT Stop Time 1500   OT Time Calculation (min) 27 min   Activity Tolerance Patient tolerated treatment well   Behavior During Therapy [facility] for tasks assessed/performed      Past Medical History  Diagnosis Date  . Heart murmur     as a child  . Chronic bronchitis   . Anxiety     panic attacks  . Depression   . Headache     onset a few months ago  . Neuropathy     had it in both hands  . Arthritis   . Fibromyalgia   . 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 Anemia   . Rosacea   . Hypercholesterolemia   . Asthma   . COPD (chronic obstructive pulmonary disease)   . Seizures     takes Gabapentin  (last one in 2010)    Past Surgical History  Procedure Laterality Date  . Wisdom tooth extraction    . Tonsillectomy Left 08/20/2014    Procedure: TONSILLECTOMY;  Surgeon: Ruby Cola, MD;  Location: [facility] OR;  Service: ENT;  Laterality: Left;  . Radical neck dissection Left 08/20/2014    Procedure: RADICAL LEFT NECK DISSECTION ;  Surgeon: Ruby Cola, MD;  Location: [place];  Service: ENT;  Laterality: Left;  . Tracheostomy tube placement N/A 08/20/2014    Procedure: TRACHEOSTOMY - AWAKE;  Surgeon: Ruby Cola, MD;  Location: [place];  Service: ENT;  Laterality: N/A;  . Gastrostomy tube placement    . Multiple extractions with alveoloplasty N/A 09/17/2014   Procedure: Extraction of tooth #'s [id number] with alveoloplasty, mandibular left lingual torus reduction, and gross debridement of remaining teeth.;  Surgeon: Lenn Cal, DDS;  Location: [place];  Service: Oral Surgery;  Laterality: N/A;    There were no vitals filed for this visit.  Visit Diagnosis:  Decreased pinch strength - Plan: Ot plan of care cert/re-cert  Decreased grip strength - Plan: Ot plan of care cert/re-cert      Subjective Assessment - 02/17/15 1512    Subjective  S: I only have a hard time with opening tight containers. I had trouble before but now it's worse.    Pertinent History Patient is a 49 y/o female s/p malignant neoplasm of base of tongue and pharynx presenting to occupational therapy with difficulty with hand and pinch strength. Pt has been referred to our Cancer [facility]hab by Dr. Whitney Muse for occupational therapy to evaluate and treat.    Patient Stated Goals None stated.    Currently in Pain? Yes   Pain Score 8    Pain Location Back           [facility] OT Assessment - 02/17/15 1436    Assessment   Diagnosis Bilateral hand weakness   Onset Date --  Feb. 2016   Prior Therapy None   Precautions   Precautions None   Precaution Comments still has PEG and Portacath    Restrictions   Weight Bearing Restrictions No   Balance Screen   Has the patient fallen in the past 6 months No   Home  Environment   Family/patient expects to be discharged to: Private residence   Prior Function   Level of Independence Independent;Independent with basic ADLs;Independent with gait;Independent with transfers   Vocation Full time employment   Vocation Requirements worked at [employer]    ADL   ADL comments Pt reports difficulty with opening tight containers.    Mobility   Mobility Status Independent   Written Expression   Dominant Hand Right   Vision - History   Baseline Vision Wears glasses all the time   Cognition   Overall Cognitive Status Within Functional  Limits for tasks assessed   ROM / Strength   AROM / PROM / Strength Strength;AROM   AROM   Overall AROM  Within functional limits for tasks performed   AROM Assessment Site Shoulder   Strength   Overall Strength Within functional limits for tasks performed   Overall Strength Comments Assessed seated/    Strength Assessment Site Hand   Right/Left hand Right;Left   Right Hand Gross Grasp Functional   Right Hand Grip (lbs) 47   Right Hand Lateral Pinch 10 lbs   Right Hand 3 Point Pinch 12 lbs   Left Hand Gross Grasp Functional   Left Hand Grip (lbs) 47   Left Hand Lateral Pinch 12 lbs   Left Hand 3 Point Pinch 12 lbs                         OT Education - 02/17/15 1520    Education provided Yes   Education Details Hand and pinch strengthening HEP   Person(s) Educated Patient   Methods Explanation;Handout;Demonstration   Comprehension Verbalized understanding          OT Short Term Goals - 02/18/15 0749    OT SHORT TERM GOAL #1   Title Patient will be educated and independent with HEP.    Time 1   Period Days   Status Achieved                  Plan - 02/17/15 1521    Clinical Impression Statement A: Patient is a 49 y/o female s/p malignant neoplasm of base of tongue and pharynx with reports of slight difficulty with opening tight containters. Strength was assessed and patient scored at or close to norm. Disscussed findings with patient and recommended a HEP to complete independently. Pt agreed with recommendation.    Pt will benefit from skilled therapeutic intervention in order to improve on the following deficits (Retired) Decreased strength   Rehab Potential Excellent   OT Frequency 1x / week   OT Duration --  1 week   OT Treatment/Interventions Patient/family education   Plan P: 1 time eval with HEP.   Consulted and Agree with Plan of Care Patient        Problem List Patient Active Problem List   Diagnosis Date Noted  . Chronic  periodontitis 09/17/2014  . Therapeutic opioid induced constipation 09/16/2014  . 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 Malignant neoplasm of base of tongue 09/02/2014  . Malignant neoplasm of pharynx 08/20/2014    Ailene Ravel, OTR/L,CBIS  [phone]  02/18/2015, 7:53 AM  [place], [place], [address] Phone: [phone]   Fax:  [phone]

## 2015-02-22 ENCOUNTER — Ambulatory Visit (HOSPITAL_COMMUNITY)
Admission: RE | Admit: 2015-02-22 | Discharge: 2015-02-22 | Disposition: A | Discharge status: Home / Self Care | Payer: 59 | Source: Ambulatory Visit | Attending: Oncology | Admitting: Oncology

## 2015-02-22 ENCOUNTER — Other Ambulatory Visit (HOSPITAL_COMMUNITY): Payer: Self-pay | Admitting: *Deleted

## 2015-02-22 DIAGNOSIS — Z08 Encounter for follow-up examination after completed treatment for malignant neoplasm: Secondary | ICD-10-CM | POA: Diagnosis present

## 2015-02-22 DIAGNOSIS — G629 Polyneuropathy, unspecified: Secondary | ICD-10-CM | POA: Diagnosis not present

## 2015-02-22 DIAGNOSIS — R918 Other nonspecific abnormal finding of lung field: Secondary | ICD-10-CM | POA: Insufficient documentation

## 2015-02-22 DIAGNOSIS — T402X5A Adverse effect of other opioids, initial encounter: Secondary | ICD-10-CM | POA: Insufficient documentation

## 2015-02-22 DIAGNOSIS — R590 Localized enlarged lymph nodes: Secondary | ICD-10-CM | POA: Diagnosis not present

## 2015-02-22 DIAGNOSIS — C01 Malignant neoplasm of base of tongue: Secondary | ICD-10-CM | POA: Diagnosis present

## 2015-02-22 DIAGNOSIS — K5909 Other constipation: Secondary | ICD-10-CM | POA: Insufficient documentation

## 2015-02-22 DIAGNOSIS — B001 Herpesviral vesicular dermatitis: Secondary | ICD-10-CM | POA: Insufficient documentation

## 2015-02-22 DIAGNOSIS — C140 Malignant neoplasm of pharynx, unspecified: Secondary | ICD-10-CM | POA: Insufficient documentation

## 2015-02-22 DIAGNOSIS — E780 Pure hypercholesterolemia: Secondary | ICD-10-CM | POA: Insufficient documentation

## 2015-02-22 LAB — GLUCOSE, CAPILLARY: Glucose-Capillary: 98 mg/dL (ref 65–99)

## 2015-02-22 MED ORDER — FLUDEOXYGLUCOSE F - 18 (FDG) INJECTION
5.8000 | Freq: Once | INTRAVENOUS | Status: DC | PRN
  Administered 2015-02-22: 5.8 via INTRAVENOUS
  Filled 2015-02-22: qty 5.8

## 2015-02-23 ENCOUNTER — Encounter (HOSPITAL_COMMUNITY): Payer: Self-pay

## 2015-02-23 ENCOUNTER — Ambulatory Visit (HOSPITAL_COMMUNITY): Payer: 59 | Admitting: Physical Therapy

## 2015-02-23 ENCOUNTER — Other Ambulatory Visit (HOSPITAL_COMMUNITY): Payer: Self-pay | Admitting: Oncology

## 2015-02-23 ENCOUNTER — Encounter (HOSPITAL_BASED_OUTPATIENT_CLINIC_OR_DEPARTMENT_OTHER): Payer: 59

## 2015-02-23 VITALS — BP 87/55 | HR 87 | Temp 98.8°F | Resp 20 | Wt 106.3 lb

## 2015-02-23 DIAGNOSIS — Z87891 Personal history of nicotine dependence: Secondary | ICD-10-CM | POA: Diagnosis not present

## 2015-02-23 DIAGNOSIS — C140 Malignant neoplasm of pharynx, unspecified: Secondary | ICD-10-CM

## 2015-02-23 DIAGNOSIS — R29898 Other symptoms and signs involving the musculoskeletal system: Secondary | ICD-10-CM

## 2015-02-23 DIAGNOSIS — M6281 Muscle weakness (generalized): Secondary | ICD-10-CM

## 2015-02-23 DIAGNOSIS — C029 Malignant neoplasm of tongue, unspecified: Secondary | ICD-10-CM

## 2015-02-23 DIAGNOSIS — C01 Malignant neoplasm of base of tongue: Secondary | ICD-10-CM

## 2015-02-23 DIAGNOSIS — R2681 Unsteadiness on feet: Secondary | ICD-10-CM

## 2015-02-23 DIAGNOSIS — J189 Pneumonia, unspecified organism: Secondary | ICD-10-CM

## 2015-02-23 DIAGNOSIS — R6889 Other general symptoms and signs: Secondary | ICD-10-CM

## 2015-02-23 DIAGNOSIS — Z95828 Presence of other vascular implants and grafts: Secondary | ICD-10-CM

## 2015-02-23 DIAGNOSIS — Z452 Encounter for adjustment and management of vascular access device: Secondary | ICD-10-CM

## 2015-02-23 LAB — PREPARE RBC (CROSSMATCH)

## 2015-02-23 LAB — SAMPLE TO BLOOD BANK

## 2015-02-23 LAB — CBC WITH DIFFERENTIAL/PLATELET
BASOS PCT: 0 % (ref 0–1)
Basophils Absolute: 0 10*3/uL (ref 0.0–0.1)
EOS ABS: 0.1 10*3/uL (ref 0.0–0.7)
EOS PCT: 1 % (ref 0–5)
HCT: 20 % — ABNORMAL LOW (ref 36.0–46.0)
Hemoglobin: 6.7 g/dL — CL (ref 12.0–15.0)
LYMPHS ABS: 0.4 10*3/uL — AB (ref 0.7–4.0)
Lymphocytes Relative: 2 % — ABNORMAL LOW (ref 12–46)
MCH: 32.8 pg (ref 26.0–34.0)
MCHC: 33.5 g/dL (ref 30.0–36.0)
MCV: 98 fL (ref 78.0–100.0)
Monocytes Absolute: 1.7 10*3/uL — ABNORMAL HIGH (ref 0.1–1.0)
Monocytes Relative: 11 % (ref 3–12)
Neutro Abs: 12.7 10*3/uL — ABNORMAL HIGH (ref 1.7–7.7)
Neutrophils Relative %: 86 % — ABNORMAL HIGH (ref 43–77)
PLATELETS: 332 10*3/uL (ref 150–400)
RBC: 2.04 MIL/uL — AB (ref 3.87–5.11)
RDW: 15.8 % — ABNORMAL HIGH (ref 11.5–15.5)
WBC: 14.8 10*3/uL — AB (ref 4.0–10.5)

## 2015-02-23 LAB — COMPREHENSIVE METABOLIC PANEL
ALK PHOS: 68 U/L (ref 38–126)
ALT: 7 U/L — AB (ref 14–54)
AST: 13 U/L — AB (ref 15–41)
Albumin: 3 g/dL — ABNORMAL LOW (ref 3.5–5.0)
Anion gap: 8 (ref 5–15)
BILIRUBIN TOTAL: 0.5 mg/dL (ref 0.3–1.2)
BUN: 43 mg/dL — AB (ref 6–20)
CALCIUM: 8.5 mg/dL — AB (ref 8.9–10.3)
CHLORIDE: 86 mmol/L — AB (ref 101–111)
CO2: 31 mmol/L (ref 22–32)
CREATININE: 2.3 mg/dL — AB (ref 0.44–1.00)
GFR, EST AFRICAN AMERICAN: 28 mL/min — AB (ref >60)
GFR, EST NON AFRICAN AMERICAN: 24 mL/min — AB (ref >60)
Glucose, Bld: 99 mg/dL (ref 65–99)
Potassium: 4.7 mmol/L (ref 3.5–5.1)
Sodium: 125 mmol/L — ABNORMAL LOW (ref 135–145)
Total Protein: 7.2 g/dL (ref 6.5–8.1)

## 2015-02-23 LAB — TSH: TSH: 4.876 u[IU]/mL — AB (ref 0.350–4.500)

## 2015-02-23 MED ORDER — SODIUM CHLORIDE 0.9 % IJ SOLN
10.0000 mL | INTRAMUSCULAR | Status: DC | PRN
  Administered 2015-02-23: 10 mL via INTRAVENOUS
  Filled 2015-02-23: qty 10

## 2015-02-23 MED ORDER — SULFAMETHOXAZOLE-TRIMETHOPRIM 800-160 MG PO TABS: 1.0000 | ORAL_TABLET | Freq: Two times a day (BID) | ORAL | Status: DC

## 2015-02-23 MED ORDER — HEPARIN SOD (PORK) LOCK FLUSH 100 UNIT/ML IV SOLN
500.0000 [IU] | Freq: Once | INTRAVENOUS | Status: AC
Start: 2015-02-23 — End: 2015-02-23
  Administered 2015-02-23: 500 [IU] via INTRAVENOUS

## 2015-02-23 MED ORDER — HEPARIN SOD (PORK) LOCK FLUSH 100 UNIT/ML IV SOLN
INTRAVENOUS | Status: AC
  Filled 2015-02-23: qty 5

## 2015-02-23 NOTE — Addendum Note (Signed)
Addended by: Kurtis Bushman A on: 02/23/2015 02:44 PM   Modules accepted: Orders

## 2015-02-23 NOTE — Therapy (Signed)
[place], [place], [address] Phone: [phone]   Fax:  [phone]  Physical Therapy Treatment  Patient Details  Name: Shelby Larson MRN: [id number] Date of Birth: 1966/05/11 Referring Provider:  Patrici Ranks, MD  Encounter Date: 02/23/2015      PT End of Session - 02/23/15 0913    Visit Number 2   Number of Visits 12   Authorization Type UHC (23 visit limit for PT)   Authorization Time Period 02/17/15 to 04/19/15   PT Start Time 0805   PT Stop Time 0845   PT Time Calculation (min) 40 min   Activity Tolerance Patient tolerated treatment well   Behavior During Therapy [facility] for tasks assessed/performed      Past Medical History  Diagnosis Date  . Heart murmur     as a child  . Chronic bronchitis   . Anxiety     panic attacks  . Depression   . Headache     onset a few months ago  . Neuropathy     had it in both hands  . Arthritis   . Fibromyalgia   . 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 Anemia   . Rosacea   . Hypercholesterolemia   . Asthma   . COPD (chronic obstructive pulmonary disease)   . Seizures     takes Gabapentin  (last one in 2010)    Past Surgical History  Procedure Laterality Date  . Wisdom tooth extraction    . Tonsillectomy Left 08/20/2014    Procedure: TONSILLECTOMY;  Surgeon: Ruby Cola, MD;  Location: [facility] OR;  Service: ENT;  Laterality: Left;  . Radical neck dissection Left 08/20/2014    Procedure: RADICAL LEFT NECK DISSECTION ;  Surgeon: Ruby Cola, MD;  Location: [place];  Service: ENT;  Laterality: Left;  . Tracheostomy tube placement N/A 08/20/2014    Procedure: TRACHEOSTOMY - AWAKE;  Surgeon: Ruby Cola, MD;  Location: [place];  Service: ENT;  Laterality: N/A;  . Gastrostomy tube placement    . Multiple extractions with alveoloplasty N/A 09/17/2014    Procedure: Extraction of tooth #'s [id number] with  alveoloplasty, mandibular left lingual torus reduction, and gross debridement of remaining teeth.;  Surgeon: Lenn Cal, DDS;  Location: [place];  Service: Oral Surgery;  Laterality: N/A;    There were no vitals filed for this visit.  Visit Diagnosis:  Tongue cancer  Pharynx cancer  Proximal muscle weakness  Weakness of both lower extremities  Decreased functional activity tolerance  Unsteadiness      Subjective Assessment - 02/23/15 0809    Subjective Pt states she currently having pain in her throat and Lt lower quadrant where her feeting tube is.  States 8/10 pain today.   Currently in Pain? Yes   Pain Score 8    Pain Location Throat                         [facility] Adult PT Treatment/Exercise - 02/23/15 9449    Knee/Hip Exercises: Aerobic   Nustep 10 minutes flat surface level 2 UE/LE   Knee/Hip Exercises: Standing   Heel Raises 10 reps   Heel Raises Limitations toeraises 10 reps   Hip Abduction Both;10 reps   Abduction Limitations bilateral HHA   Hip Extension Both;10 reps   Extension Limitations bilateral HHA   Lateral Step Up  Both;10 reps;Step Height: 4";Hand Hold: 1   Forward Step Up Both;10 reps;Step Height: 4";Hand Hold: 1   Functional Squat 10 reps   SLS Bilateral Rt: 30", Lt:20"   Other Standing Knee Exercises marching with holds 10reps                PT Education - 02/23/15 0924    Education provided Yes   Education Details given copy of initial evalution with explanation of goals/measurements.   Person(s) Educated Patient   Methods Explanation;Handout   Comprehension Verbalized understanding          PT Short Term Goals - 02/23/15 0917    PT SHORT TERM GOAL #1   Title Patient will experiecne no more than 5/10 pain and no more than 5/10 fatigue during all functional tasks and activities of at leaset 60 minutes in duration    Time 3   Period Weeks   Status On-going   PT SHORT TERM GOAL #2   Title Patient will be able  to maintain SLS on each leg for at least 20 seconds with no HHA    Time 3   Period Weeks   Status Achieved   PT SHORT TERM GOAL #3   Title Patient will be independent in correctly and consistently performing appropriate HEP, to be updated PRN    Time 3   Period Weeks   Status On-going           PT Long Term Goals - 02/23/15 0920    PT LONG TERM GOAL #1   Title Patient will demonstarte 5/5 strength in bilateral lower extremties and at least 4/5 strength in proximal muscles and core    Time 6   Period Weeks   Status On-going   PT LONG TERM GOAL #2   Title Patient will demonstrate improved functional activity tolerance as evidenced by an abiltiy to tolerate at least 15 minutes of exercise on Nustep on at least level 4    Time 6   Period Weeks   Status On-going   PT LONG TERM GOAL #3   Title Patient will be able to maintain SLS on each leg for at least 45 seconds    Time 6   Period Weeks   Status On-going   PT LONG TERM GOAL #4   Title Patient to report she is experiencing no more than 3/10 pain and 3/10 fatigue during functional tasks and activities of at least 90 minutes in duration    Time 6   Period Weeks   Status On-going               Plan - 02/23/15 3893    Clinical Impression Statement Reviewed HEP and initial evaluation.  PT requires multimodal cues to complete HEP correctly.  Did not add additional exericises to HEP today.  Progressed with  step ups, SLS and squats today.  Also added nustep at end of session to increase actvity tolerance/reduce fatigue.  Noted weakness/trembling in LE's with therex today.  PT required 2 short standing rest breaks during session today.    PT Next Visit Plan Progress functional strength and balance.  Add tandem stance, forward lunge on 4" step and progress to balance actvities.     Consulted and Agree with Plan of Care Patient        Problem List Patient Active Problem List   Diagnosis Date Noted  . Chronic periodontitis  09/17/2014  . Therapeutic opioid induced constipation 09/16/2014  . Recurrent cold sores  09/15/2014  . Malignant neoplasm of base of tongue 09/02/2014  . Malignant neoplasm of pharynx 08/20/2014    Teena Irani, PTA/CLT [phone]  02/23/2015, 9:35 AM  [place], [place], [address] Phone: [phone]   Fax:  [phone]

## 2015-02-23 NOTE — Progress Notes (Signed)
..  CRITICAL VALUE ALERT Critical value received:  HgB-6.7 Date of notification:  02/23/15 Time of notification: 3154 Critical value read back:  Yes.   Nurse who received alert:  Josephina Shih, RN MD notified (1st page):  Robynn Pane, PA notified in person, orders placed for 2 units PRBC, Tammy to call patient and inform her to come in for blood tomorrow

## 2015-02-23 NOTE — Progress Notes (Signed)
..  Shelby Larson presents today for port flush and to discuss concerns over trach and feeding tube. Pet scan results discussed and assessed symptoms of pneumonia. Discussed with T. Kefalas PA and antibiotic prescribed. She has tenderness over feeding tube area, and c/o general fatigue. Labs as ordered. She will discuss timeline for trach interventions with speech pathology when she goes for [facility] program. Appointment with T. Sheldon Silvan PA-C next week to follow-up on unresolved issues.  Marland KitchenArgentina Larson presented for Portacath access and flush.  Portacath located rt chest wall accessed with  H 20 needle.  Good blood return present. Portacath flushed with 42ml NS and 500U/46ml Heparin and needle removed intact.  Procedure tolerated well and without incident.

## 2015-02-24 ENCOUNTER — Telehealth (HOSPITAL_COMMUNITY): Payer: Self-pay | Admitting: Emergency Medicine

## 2015-02-24 ENCOUNTER — Encounter (HOSPITAL_COMMUNITY): Payer: Self-pay

## 2015-02-24 ENCOUNTER — Encounter (HOSPITAL_BASED_OUTPATIENT_CLINIC_OR_DEPARTMENT_OTHER): Payer: 59

## 2015-02-24 VITALS — BP 121/75 | HR 77 | Temp 98.3°F | Resp 18

## 2015-02-24 DIAGNOSIS — C01 Malignant neoplasm of base of tongue: Secondary | ICD-10-CM

## 2015-02-24 MED ORDER — DIPHENHYDRAMINE HCL 25 MG PO CAPS
25.0000 mg | ORAL_CAPSULE | Freq: Once | ORAL | Status: AC
  Administered 2015-02-24: 25 mg via ORAL

## 2015-02-24 MED ORDER — SODIUM CHLORIDE 0.9 % IV SOLN
250.0000 mL | Freq: Once | INTRAVENOUS | Status: AC
  Administered 2015-02-24: 250 mL via INTRAVENOUS

## 2015-02-24 MED ORDER — HEPARIN SOD (PORK) LOCK FLUSH 100 UNIT/ML IV SOLN
500.0000 [IU] | Freq: Once | INTRAVENOUS | Status: AC
Start: 2015-02-24 — End: 2015-02-24
  Administered 2015-02-24: 500 [IU] via INTRAVENOUS

## 2015-02-24 MED ORDER — ACETAMINOPHEN 325 MG PO TABS
650.0000 mg | ORAL_TABLET | Freq: Once | ORAL | Status: AC
  Administered 2015-02-24: 650 mg via ORAL

## 2015-02-24 MED ORDER — SODIUM CHLORIDE 0.9 % IJ SOLN: 10.0000 mL | INTRAMUSCULAR | Status: DC | PRN

## 2015-02-24 MED ORDER — DIPHENHYDRAMINE HCL 25 MG PO CAPS
ORAL_CAPSULE | ORAL | Status: AC
  Filled 2015-02-24: qty 1

## 2015-02-24 MED ORDER — HEPARIN SOD (PORK) LOCK FLUSH 100 UNIT/ML IV SOLN
250.0000 [IU] | INTRAVENOUS | Status: DC | PRN
  Filled 2015-02-24: qty 5

## 2015-02-24 MED ORDER — ACETAMINOPHEN 325 MG PO TABS
ORAL_TABLET | ORAL | Status: AC
  Filled 2015-02-24: qty 2

## 2015-02-24 NOTE — Telephone Encounter (Signed)
-----   Message from [facility], PA-C sent at 02/24/2015  3:47 PM EDT ----- Increase fluids

## 2015-02-24 NOTE — Patient Instructions (Signed)
[place] at [facility] Discharge Instructions  RECOMMENDATIONS MADE BY THE CONSULTANT AND ANY TEST RESULTS WILL BE SENT TO YOUR REFERRING PHYSICIAN.  Today you received 2 units of blood. Return as scheduled for office visit.  Thank you for choosing [place] at [facility] to provide your oncology and hematology care.  To afford each patient quality time with our provider, please arrive at least 15 minutes before your scheduled appointment time.    You need to re-schedule your appointment should you arrive 10 or more minutes late.  We strive to give you quality time with our providers, and arriving late affects you and other patients whose appointments are after yours.  Also, if you no show three or more times for appointments you may be dismissed from the clinic at the providers discretion.     Again, thank you for choosing [facility].  Our hope is that these requests will decrease the amount of time that you wait before being seen by our physicians.       _____________________________________________________________  Should you have questions after your visit to [facility], please contact our office at [phone] between the hours of 8:30 a.m. and 4:30 p.m.  Voicemails left after 4:30 p.m. will not be returned until the following business day.  For prescription refill requests, have your pharmacy contact our office.

## 2015-02-24 NOTE — Telephone Encounter (Signed)
Spoke with pts mother, tell her she needs to increase her fluid intake

## 2015-02-25 ENCOUNTER — Ambulatory Visit (HOSPITAL_COMMUNITY): Payer: Self-pay | Admitting: Physical Therapy

## 2015-02-25 LAB — TYPE AND SCREEN
ABO/RH(D): A POS
Antibody Screen: NEGATIVE
UNIT DIVISION: 0
Unit division: 0

## 2015-03-02 ENCOUNTER — Encounter (HOSPITAL_COMMUNITY): Payer: Self-pay | Admitting: Oncology

## 2015-03-02 ENCOUNTER — Encounter (HOSPITAL_COMMUNITY): Payer: 59 | Attending: Hematology & Oncology | Admitting: Oncology

## 2015-03-02 VITALS — BP 89/66 | HR 88 | Temp 98.8°F | Resp 18 | Wt 101.3 lb

## 2015-03-02 DIAGNOSIS — Z452 Encounter for adjustment and management of vascular access device: Secondary | ICD-10-CM | POA: Diagnosis not present

## 2015-03-02 DIAGNOSIS — Z87891 Personal history of nicotine dependence: Secondary | ICD-10-CM | POA: Insufficient documentation

## 2015-03-02 DIAGNOSIS — D649 Anemia, unspecified: Secondary | ICD-10-CM

## 2015-03-02 DIAGNOSIS — R635 Abnormal weight gain: Secondary | ICD-10-CM

## 2015-03-02 DIAGNOSIS — C140 Malignant neoplasm of pharynx, unspecified: Secondary | ICD-10-CM

## 2015-03-02 DIAGNOSIS — C01 Malignant neoplasm of base of tongue: Secondary | ICD-10-CM | POA: Insufficient documentation

## 2015-03-02 DIAGNOSIS — Z95828 Presence of other vascular implants and grafts: Secondary | ICD-10-CM

## 2015-03-02 LAB — RETICULOCYTES
RBC.: 3.22 MIL/uL — AB (ref 3.87–5.11)
RETIC COUNT ABSOLUTE: 25.8 10*3/uL (ref 19.0–186.0)
RETIC CT PCT: 0.8 % (ref 0.4–3.1)

## 2015-03-02 LAB — CBC WITH DIFFERENTIAL/PLATELET
BASOS PCT: 0 % (ref 0–1)
Basophils Absolute: 0 10*3/uL (ref 0.0–0.1)
EOS ABS: 0.2 10*3/uL (ref 0.0–0.7)
Eosinophils Relative: 2 % (ref 0–5)
HEMATOCRIT: 30 % — AB (ref 36.0–46.0)
Hemoglobin: 10.1 g/dL — ABNORMAL LOW (ref 12.0–15.0)
LYMPHS ABS: 0.6 10*3/uL — AB (ref 0.7–4.0)
Lymphocytes Relative: 7 % — ABNORMAL LOW (ref 12–46)
MCH: 31.4 pg (ref 26.0–34.0)
MCHC: 33.7 g/dL (ref 30.0–36.0)
MCV: 93.2 fL (ref 78.0–100.0)
MONO ABS: 0.5 10*3/uL (ref 0.1–1.0)
MONOS PCT: 6 % (ref 3–12)
NEUTROS ABS: 8.1 10*3/uL — AB (ref 1.7–7.7)
Neutrophils Relative %: 85 % — ABNORMAL HIGH (ref 43–77)
Platelets: 395 10*3/uL (ref 150–400)
RBC: 3.22 MIL/uL — ABNORMAL LOW (ref 3.87–5.11)
RDW: 16.9 % — AB (ref 11.5–15.5)
WBC: 9.4 10*3/uL (ref 4.0–10.5)

## 2015-03-02 LAB — VITAMIN B12: Vitamin B-12: 372 pg/mL (ref 180–914)

## 2015-03-02 LAB — BASIC METABOLIC PANEL
Anion gap: 11 (ref 5–15)
BUN: 54 mg/dL — AB (ref 6–20)
CALCIUM: 9.3 mg/dL (ref 8.9–10.3)
CO2: 27 mmol/L (ref 22–32)
CREATININE: 3.48 mg/dL — AB (ref 0.44–1.00)
Chloride: 93 mmol/L — ABNORMAL LOW (ref 101–111)
GFR calc non Af Amer: 15 mL/min — ABNORMAL LOW (ref >60)
GFR, EST AFRICAN AMERICAN: 17 mL/min — AB (ref >60)
Glucose, Bld: 89 mg/dL (ref 65–99)
Potassium: 4.6 mmol/L (ref 3.5–5.1)
Sodium: 131 mmol/L — ABNORMAL LOW (ref 135–145)

## 2015-03-02 LAB — FERRITIN: FERRITIN: 553 ng/mL — AB (ref 11–307)

## 2015-03-02 LAB — IRON AND TIBC
Iron: 91 ug/dL (ref 28–170)
SATURATION RATIOS: 29 % (ref 10.4–31.8)
TIBC: 318 ug/dL (ref 250–450)
UIBC: 227 ug/dL

## 2015-03-02 LAB — FOLATE: FOLATE: 12.5 ng/mL (ref >5.9)

## 2015-03-02 LAB — LACTATE DEHYDROGENASE: LDH: 92 U/L — ABNORMAL LOW (ref 98–192)

## 2015-03-02 MED ORDER — SODIUM CHLORIDE 0.9 % IJ SOLN
10.0000 mL | INTRAMUSCULAR | Status: DC | PRN
  Administered 2015-03-02: 10 mL via INTRAVENOUS
  Filled 2015-03-02: qty 10

## 2015-03-02 MED ORDER — FENTANYL 75 MCG/HR TD PT72: 75.0000 ug | MEDICATED_PATCH | TRANSDERMAL | Status: DC

## 2015-03-02 MED ORDER — HEPARIN SOD (PORK) LOCK FLUSH 100 UNIT/ML IV SOLN
INTRAVENOUS | Status: AC
  Filled 2015-03-02: qty 5

## 2015-03-02 MED ORDER — HEPARIN SOD (PORK) LOCK FLUSH 100 UNIT/ML IV SOLN
500.0000 [IU] | Freq: Once | INTRAVENOUS | Status: AC
  Administered 2015-03-02: 500 [IU] via INTRAVENOUS

## 2015-03-02 MED ORDER — LIDOCAINE-PRILOCAINE 2.5-2.5 % EX CREA: TOPICAL_CREAM | CUTANEOUS | Status: DC

## 2015-03-02 NOTE — Progress Notes (Signed)
Shelby Larson presented for lab work. Labs per MD order drawn via Portacath located in the right chest wall accessed with  H 22 needle. Good blood return present. Procedure without incident.  Portacath flushed with 65ml NS and 500U/36ml Heparin per protocol and needle removed intact. Patient tolerated procedure well.

## 2015-03-02 NOTE — Assessment & Plan Note (Signed)
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  S/P concurrent chemoradiation with Cisplatin with curative intent.  Labs today: CBC diff, BMET, anemia panel, retic count, haptoglobin, LDH, stool cards x 3.  Referral to GI for anemia (acute).  Referral to IR for PEG replacement.  Weight is down 5 lbs because of her PEG leaking.  Nutritionist following and made contact with me today regarding weight change.    Based upon lab work ordered today, future recommendations may follow.  Patient encouraged to increase PO intake of food and H2O.  She declines PO food due to taste.  She will need to force food PO to diminish her reliance of PEG tube.  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  She is encouraged to increase her PO fluids.  Return in 4 weeks for follow-up and labs.

## 2015-03-02 NOTE — Patient Instructions (Signed)
[place] at [facility] Discharge Instructions  RECOMMENDATIONS MADE BY THE CONSULTANT AND ANY TEST RESULTS WILL BE SENT TO YOUR REFERRING PHYSICIAN.  Exam done and seen by [name] today. Labs in 1 month,   Thank you for choosing [place] at [facility] to provide your oncology and hematology care.  To afford each patient quality time with our provider, please arrive at least 15 minutes before your scheduled appointment time.    You need to re-schedule your appointment should you arrive 10 or more minutes late.  We strive to give you quality time with our providers, and arriving late affects you and other patients whose appointments are after yours.  Also, if you no show three or more times for appointments you may be dismissed from the clinic at the providers discretion.     Again, thank you for choosing [facility].  Our hope is that these requests will decrease the amount of time that you wait before being seen by our physicians.       _____________________________________________________________  Should you have questions after your visit to [facility], please contact our office at [phone] between the hours of 8:30 a.m. and 4:30 p.m.  Voicemails left after 4:30 p.m. will not be returned until the following business day.  For prescription refill requests, have your pharmacy contact our office.

## 2015-03-02 NOTE — Progress Notes (Signed)
[name], MD [place] [address]  Anemia, unspecified anemia type - Plan: CBC with Differential, Basic metabolic panel, Vitamin C58, Folate, Iron and TIBC, Ferritin, Reticulocytes, Haptoglobin, Lactate dehydrogenase, Occult blood card to lab, stool, Occult blood card to lab, stool, Occult blood card to lab, stool, CBC with Differential, Basic metabolic panel, Vitamin I50, Folate, Iron and TIBC, Ferritin, Reticulocytes, Haptoglobin, Lactate dehydrogenase  Malignant neoplasm of base of tongue - Plan: fentaNYL (DURAGESIC - DOSED MCG/HR) 75 MCG/HR, lidocaine-prilocaine (EMLA) cream, IR Replc Gastro/Colonic Tube Percut W/Fluoro, CANCELED: IR GASTROSTOMY TUBE MOD SED  Malignant neoplasm of pharynx - Plan: fentaNYL (DURAGESIC - DOSED MCG/HR) 75 MCG/HR, lidocaine-prilocaine (EMLA) cream  Weight gain - Plan: fentaNYL (DURAGESIC - DOSED MCG/HR) 75 MCG/HR  Port catheter in place - Plan: heparin lock flush 100 unit/mL, sodium chloride 0.9 % injection 10 mL  CURRENT THERAPY: Observation  INTERVAL HISTORY: Shelby Larson 49 y.o. 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     Malignant neoplasm of base of tongue   08/20/2014 Surgery Diagnosis 1. Tongue, biopsy, left base/ pharynx mass - INVASIVE MODERATELY DIFFERENTIATED SQUAMOUS CELL CARCINOMA. - SEE COMMENT. 2. Lymph nodes, radical neck dissection, Left - THREE OF SIXTEEN LYMPH NODES POSITIVE FOR METASTATIC SQUAMOUS CELL [facility]   09/18/2014 Surgery Multiple extraction of tooth numbers 2, 14, 15, 18, and 31.  4 Quadrants of alveoloplasty.  Gross debridement of remaining dentition. Mandibular left lingual torus reduction   10/08/2014 - 11/19/2014 Chemotherapy Cisplatin every 21 days   10/09/2014 -  11/30/2014 Radiation Therapy [name] with twice daily dosing at the end of treatment at patient's request due to trip to [facility] against medical advice.    I personally reviewed and went over laboratory results with the patient.  The results are noted within this dictation.  Further work-up for anemia will be done today.  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  She is encouraged to drink more fluids.  I have reviewed message sent to me by [name].  Of note, [name] will be referring the patient to another ENT physician at that patient's request.  The patient notes that her PEG is leaking.  On nursing exam, she has at least 6 cracks in the tubing.  This is not an acute issue and has probably been ongoing for some time.  Her PEG has been placed by IR and as a result, I will refer to IR for a PEG exchange.  "Since this [PEG] isn't working, what am I expected to eat?"  She is educated that she should be eating solid foods by now.  She is not and avoiding solid foods because of the taste.  She is encouraged to eat more solid foods, even soups and soft foods would be beneficial for her.     Past Medical History  Diagnosis Date  . Heart murmur     as a child  . Chronic bronchitis   . Anxiety     panic attacks  . Depression   . Headache     onset a few months ago  . Neuropathy     had it in both hands  . Arthritis   . Fibromyalgia   . Malignant neoplasm  of base of tongue     Base of tongue with neck metastases  . Anemia   . Rosacea   . Hypercholesterolemia   . Asthma   . COPD (chronic obstructive pulmonary disease)   . Seizures     takes Gabapentin  (last one in 2010)    has Malignant neoplasm of pharynx; Malignant neoplasm of base of tongue; Recurrent cold sores; Therapeutic opioid induced constipation; and Chronic periodontitis on her problem list.     is allergic to bee venom; penicillins; and  latex.  Current Outpatient Prescriptions on File Prior to Visit  Medication Sig Dispense Refill  . cyclobenzaprine (FLEXERIL) 10 MG tablet Take 1 tablet (10 mg total) by mouth 3 (three) times daily as needed for muscle spasms. 30 tablet 1  . escitalopram (LEXAPRO) 20 MG tablet Take 1/2 tablet by mouth for 5 days then increase to one tablet thereafter 30 tablet 3  . HYDROcodone-acetaminophen (NORCO) 10-325 MG per tablet Take 1 tablet by mouth every 6 (six) hours as needed. 60 tablet 0  . LORazepam (ATIVAN) 1 MG tablet Take 2 tablets every 4 hours as needed for anxiety or nausea. 60 tablet 1  . naloxegol oxalate (MOVANTIK) 25 MG TABS tablet Take 1 tablet (25 mg total) by mouth every morning. 30 tablet 4  . Nutritional Supplements (FEEDING SUPPLEMENT, JEVITY 1.5 CAL/FIBER,) LIQD 5 cans. Nocturnal feed. Rate100 ml/hr x 12 hours. Provides. 1778 kcals, 76 g pro, 900 mls fluid.  Flush 180 mls 5x a day.    . prochlorperazine (COMPAZINE) 10 MG tablet Take 1 tablet (10 mg total) by mouth every 6 (six) hours as needed for nausea or vomiting. 60 tablet 2  . sodium fluoride (PREVIDENT 5000 PLUS) 1.1 % CREA dental cream Apply to tooth brush. Brush teeth for 2 minutes. Spit out excess-DO NOT swallow. Repeat nightly. 1 Tube prn  . sucralfate (CARAFATE) 1 G tablet Dissolve 1 tablet in 10 mL H20 and swallow 30 min prior to meals and bedtime. 60 tablet 5  . tetrahydrozoline-zinc (VISINE-AC) 0.05-0.25 % ophthalmic solution Place 2 drops into both eyes daily as needed (dry eyes).    . valACYclovir (VALTREX) 500 MG tablet Take 1 tablet (500 mg total) by mouth 2 (two) times daily. 60 tablet 3  . fluticasone (FLONASE) 50 MCG/ACT nasal spray Place 2 sprays into both nostrils daily.    Marland Kitchen gabapentin (NEURONTIN) 600 MG tablet Take 600 mg by mouth 3 (three) times daily.    . pantoprazole (PROTONIX) 40 MG tablet Take 1 tablet (40 mg total) by mouth daily. (Patient not taking: Reported on 12/21/2014) 30 tablet 2  . Potassium  Bicarb-Citric Acid 20 MEQ TBEF Use via peg tube twice daily (Patient not taking: Reported on 12/21/2014) 120 each 3  . QUEtiapine (SEROQUEL) 50 MG tablet Take 1 tablet (50 mg total) by mouth daily as needed (sleep). (Patient not taking: Reported on 02/05/2015) 30 tablet 1   No current facility-administered medications on file prior to visit.    Past Surgical History  Procedure Laterality Date  . Wisdom tooth extraction    . Tonsillectomy Left 08/20/2014    Procedure: TONSILLECTOMY;  Surgeon: Ruby Cola, MD;  Location: [facility] OR;  Service: ENT;  Laterality: Left;  . Radical neck dissection Left 08/20/2014    Procedure: RADICAL LEFT NECK DISSECTION ;  Surgeon: Ruby Cola, MD;  Location: [place];  Service: ENT;  Laterality: Left;  . Tracheostomy tube placement N/A 08/20/2014    Procedure: TRACHEOSTOMY - AWAKE;  Surgeon: Ruby Cola, MD;  Location: [place];  Service: ENT;  Laterality: N/A;  . Gastrostomy tube placement    . Multiple extractions with alveoloplasty N/A 09/17/2014    Procedure: Extraction of tooth #'s [phone] with alveoloplasty, mandibular left lingual torus reduction, and gross debridement of remaining teeth.;  Surgeon: Lenn Cal, DDS;  Location: [place];  Service: Oral Surgery;  Laterality: N/A;    Denies any headaches, dizziness, double vision, fevers, chills, night sweats, nausea, vomiting, diarrhea, constipation, chest pain, heart palpitations, shortness of breath, blood in stool, black tarry stool, urinary pain, urinary burning, urinary frequency, hematuria.   PHYSICAL EXAMINATION  ECOG PERFORMANCE STATUS: 1 - Symptomatic but completely ambulatory  Filed Vitals:   03/02/15 1352  BP: 89/66  Pulse: 88  Temp: 98.8 F (37.1 C)  Resp: 18    GENERAL:alert, no distress, comfortable, cooperative, smiling and accompanied by her mother. SKIN: skin color, texture, turgor are normal, no rashes or significant lesions HEAD: Normocephalic, No masses, lesions, tenderness or  abnormalities EYES: normal, PERRLA, EOMI, Conjunctiva are pink and non-injected EARS: External ears normal OROPHARYNX:lips, buccal mucosa, and tongue normal and mucous membranes are moist  NECK: supple, trachea midline LYMPH:  not examined BREAST:not examined LUNGS: clear to auscultation  HEART: regular rate & rhythm, no murmurs and no gallops ABDOMEN:abdomen soft, normal bowel sounds and PEG tubing with cracks and unclean appearing. BACK: Back symmetric, no curvature., No CVA tenderness EXTREMITIES:less then 2 second capillary refill, no joint deformities, effusion, or inflammation, no skin discoloration, no cyanosis  NEURO: alert & oriented x 3 with fluent speech, no focal motor/sensory deficits, gait normal    LABORATORY DATA: CBC    Component Value Date/Time   WBC 9.4 03/02/2015 1448   WBC 11.3* 09/02/2014 1007   RBC 3.22* 03/02/2015 1448   RBC 3.22* 03/02/2015 1448   RBC 3.76 09/02/2014 1007   HGB 10.1* 03/02/2015 1448   HGB 11.1* 09/02/2014 1007   HCT 30.0* 03/02/2015 1448   HCT 34.8 09/02/2014 1007   PLT 395 03/02/2015 1448   PLT 396 09/02/2014 1007   MCV 93.2 03/02/2015 1448   MCV 92.6 09/02/2014 1007   MCH 31.4 03/02/2015 1448   MCH 29.5 09/02/2014 1007   MCHC 33.7 03/02/2015 1448   MCHC 31.9 09/02/2014 1007   RDW 16.9* 03/02/2015 1448   RDW 15.4* 09/02/2014 1007   LYMPHSABS 0.6* 03/02/2015 1448   LYMPHSABS 1.5 09/02/2014 1007   MONOABS 0.5 03/02/2015 1448   MONOABS 1.1* 09/02/2014 1007   EOSABS 0.2 03/02/2015 1448   EOSABS 0.2 09/02/2014 1007   BASOSABS 0.0 03/02/2015 1448   BASOSABS 0.0 09/02/2014 1007      Chemistry      Component Value Date/Time   NA 131* 03/02/2015 1448   NA 137 09/02/2014 1007   K 4.6 03/02/2015 1448   K 4.5 09/02/2014 1007   CL 93* 03/02/2015 1448   CO2 27 03/02/2015 1448   CO2 27 09/02/2014 1007   BUN 54* 03/02/2015 1448   BUN 10.7 09/02/2014 1007   CREATININE 3.48* 03/02/2015 1448   CREATININE 0.8 09/02/2014 1007       Component Value Date/Time   CALCIUM 9.3 03/02/2015 1448   CALCIUM 10.1 09/02/2014 1007   ALKPHOS 68 02/23/2015 0930   AST 13* 02/23/2015 0930   ALT 7* 02/23/2015 0930   BILITOT 0.5 02/23/2015 0930        PENDING LABS:   RADIOGRAPHIC STUDIES:  Nm Pet Image Restag (ps) Skull Base  To Thigh  02/22/2015   CLINICAL DATA:  Subsequent treatment strategy for base of tongue cancer.  EXAM: NUCLEAR MEDICINE PET SKULL BASE TO THIGH  TECHNIQUE: 5.8 mCi F-18 FDG was injected intravenously. Full-ring PET imaging was performed from the skull base to thigh after the radiotracer. CT data was obtained and used for attenuation correction and anatomic localization.  FASTING BLOOD GLUCOSE:  Value: 98 mg/dl  COMPARISON:  CT neck dated 10/10/2014.  PET-CT dated 09/09/2014.  FINDINGS: NECK  Prior oropharyngeal/left base of tongue mass is no longer visualized.  Mild residual hypermetabolism along the right vocal cord, max SUV 4.3, likely physiologic/related to treatment effect involving the contralateral side.  No hypermetabolic cervical lymphadenopathy.  CHEST  Evaluation of lung parenchyma is constrained by respiratory motion.  8 mm patchy nodular opacity in the posterior right upper lobe (series 6/image 19), max SUV 3.2. 5.2 x 5.6 cm patchy/mass-like opacity in the left lower lobe (series 6/ image 47), max SUV 11.6. 2.2 x 2.5 cm patchy nodular opacity in the posterior left lower lobe (series 6/image 48), max SUV 10.2.  These findings are technically indeterminate but favored to be infectious/inflammatory.  Underlying mild centrilobular and paraseptal emphysematous. Tracheostomy. No pleural effusion or pneumothorax.  Heart is normal in size. No pericardial effusion. Mild ectasia of the ascending thoracic aorta, measuring 3.2 cm. Hypodense blood pool relative to myocardium, suggesting anemia. Right chest port terminates at the cavoatrial junction.  11 mm short axis subcarinal node (series 4/ image 62), max SUV 4.3. 7 mm  short axis node adjacent to the lateral aspect of the descending thoracic aorta (series 4/ image 33), max SUV 4.5.  Mild hypermetabolism along the mid esophagus, nonspecific.  ABDOMEN/PELVIS  No abnormal hypermetabolic activity within the liver, pancreas, adrenal glands, or spleen.  Percutaneous gastrostomy tube with mild hypermetabolism along the anterior abdominal wall.  No hypermetabolic lymph nodes in the abdomen or pelvis.  SKELETON  Mild diffuse hypermetabolism involving the visualized thoracolumbar spine. Represented hypermetabolism a lower thoracic vertebral body demonstrates max SUV 5.0.  No focal hypermetabolic activity to suggest skeletal metastasis.  IMPRESSION: Prior oropharyngeal mass has essentially resolved.  Multifocal patchy/nodular opacities, left lower lobe predominant, indeterminate but favored to reflect pneumonia. Consider follow-up CT chest in 4-6 weeks after appropriate antimicrobial therapy.  Associated mild thoracic lymphadenopathy, nonspecific, possibly reactive.  No findings specific for metastatic disease.   Electronically Signed   By: Julian Hy M.D.   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  S/P concurrent chemoradiation with Cisplatin with curative intent.  Labs today: CBC diff, BMET, anemia panel, retic count, haptoglobin, LDH, stool cards x 3.  Referral to GI for anemia (acute).  Referral to IR for PEG replacement.  Weight is down 5 lbs because of her PEG leaking.  Nutritionist following and made contact with me today regarding weight change.    Based upon lab work ordered today, future recommendations may follow.  Patient encouraged to increase PO intake of  food and H2O.  She declines PO food due to taste.  She will need to force food PO to diminish her reliance of PEG tube.  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  She is encouraged to  increase her PO fluids.  Return in 4 weeks for follow-up and labs.      THERAPY PLAN:  NCCN guidelines for surveillance of Head and Neck cancer recommends:  A. H+P every 1-3 months for year 1  B. H+P every 2-6 months for year 2  C. H+P every 4-8 months for years 3-5  D. H+P every year for years greater than 5  E. Imaging only as clinically indicated following baseline imaging study within 6 months of completion of therapy.   F.Complete head and neck exam; mirror and fiberoptic examination as clinically indicated.  G. TSH every 6-12 months.  H. Chest imaging as clinically indicated for patients with smoking history  I. Speech/hearing and swallowing evaluation and rehabilitation as clinically indicated.  J. Smoking cessation and EtOH counseling as clinically indicated.  K. Dental evaluation: recommended for oral cavity and sites exposed to significant intraoral radiation treatment.  L. Consider EBV DNA monitoring for nasopharyngeal cancer.    All questions were answered. The patient knows to call the clinic with any problems, questions or concerns. We can certainly see the patient much sooner if necessary.  Patient and plan discussed with Dr. Ancil Linsey and she is in agreement with the aforementioned.   This note is electronically signed by: Doy Mince 03/02/2015 5:27 PM

## 2015-03-03 ENCOUNTER — Other Ambulatory Visit (HOSPITAL_COMMUNITY): Payer: Self-pay | Admitting: Interventional Radiology

## 2015-03-03 ENCOUNTER — Ambulatory Visit (HOSPITAL_COMMUNITY)
Admission: RE | Admit: 2015-03-03 | Discharge: 2015-03-03 | Disposition: A | Discharge status: Home / Self Care | Payer: 59 | Source: Ambulatory Visit | Attending: Oncology | Admitting: Oncology

## 2015-03-03 ENCOUNTER — Other Ambulatory Visit (HOSPITAL_COMMUNITY): Payer: Self-pay | Admitting: Oncology

## 2015-03-03 ENCOUNTER — Ambulatory Visit (HOSPITAL_COMMUNITY): Payer: 59 | Attending: Oncology | Admitting: Physical Therapy

## 2015-03-03 DIAGNOSIS — Z431 Encounter for attention to gastrostomy: Secondary | ICD-10-CM | POA: Insufficient documentation

## 2015-03-03 DIAGNOSIS — M6289 Other specified disorders of muscle: Secondary | ICD-10-CM | POA: Insufficient documentation

## 2015-03-03 DIAGNOSIS — C029 Malignant neoplasm of tongue, unspecified: Secondary | ICD-10-CM | POA: Insufficient documentation

## 2015-03-03 DIAGNOSIS — R29898 Other symptoms and signs involving the musculoskeletal system: Secondary | ICD-10-CM | POA: Insufficient documentation

## 2015-03-03 DIAGNOSIS — C01 Malignant neoplasm of base of tongue: Secondary | ICD-10-CM

## 2015-03-03 DIAGNOSIS — R6889 Other general symptoms and signs: Secondary | ICD-10-CM | POA: Diagnosis present

## 2015-03-03 DIAGNOSIS — C140 Malignant neoplasm of pharynx, unspecified: Secondary | ICD-10-CM | POA: Diagnosis present

## 2015-03-03 DIAGNOSIS — R2681 Unsteadiness on feet: Secondary | ICD-10-CM

## 2015-03-03 DIAGNOSIS — M6281 Muscle weakness (generalized): Secondary | ICD-10-CM

## 2015-03-03 DIAGNOSIS — E860 Dehydration: Secondary | ICD-10-CM

## 2015-03-03 LAB — HAPTOGLOBIN: Haptoglobin: 437 mg/dL — ABNORMAL HIGH (ref 34–200)

## 2015-03-03 MED ORDER — IOHEXOL 300 MG/ML  SOLN
10.0000 mL | Freq: Once | INTRAMUSCULAR | Status: DC | PRN
  Administered 2015-03-03: 10 mL
  Filled 2015-03-03: qty 10

## 2015-03-03 MED ORDER — LIDOCAINE VISCOUS 2 % MT SOLN
OROMUCOSAL | Status: AC
  Administered 2015-03-03: 5 mL
  Filled 2015-03-03: qty 15

## 2015-03-03 MED ORDER — SODIUM CHLORIDE 0.9 % IV SOLN
INTRAVENOUS | Status: DC
Start: 2015-03-03 — End: 2015-03-03

## 2015-03-03 NOTE — Procedures (Signed)
14 fr G tube exhchange, MPD No comp/EBL

## 2015-03-03 NOTE — Patient Instructions (Signed)
Flexibility: Neck Retraction    Pull head straight back, keeping eyes and jaw level. Repeat _10___ times per set. Do _1___ sets per session. Do __4__ sessions per day.  http://orth.exer.us/344   Copyright  VHI. All rights reserved.  Scapular Retraction (Standing)    With arms at sides, pinch shoulder blades together. Repeat _10___ times per set. Do __1__ sets per session. Do __4__ sessions per day.  http://orth.exer.us/944   Copyright  VHI. All rights reserved.   

## 2015-03-03 NOTE — Therapy (Signed)
[place], [place], [address] Phone: [phone]   Fax:  [phone]  Physical Therapy Treatment  Patient Details  Name: Shelby Larson MRN: [id number] Date of Birth: 08-26-1965 Referring Provider:  Patrici Ranks, MD  Encounter Date: 03/03/2015      PT End of Session - 03/03/15 0844    Visit Number 3   Number of Visits 12   Authorization Type UHC (23 visit limit for PT)   Authorization Time Period 02/17/15 to 04/19/15   PT Start Time 0802   PT Stop Time 0858   PT Time Calculation (min) 56 min   Equipment Utilized During Treatment Gait belt   Activity Tolerance Patient tolerated treatment well      Past Medical History  Diagnosis Date  . Heart murmur     as a child  . Chronic bronchitis   . Anxiety     panic attacks  . Depression   . Headache     onset a few months ago  . Neuropathy     had it in both hands  . Arthritis   . Fibromyalgia   . 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 Anemia   . Rosacea   . Hypercholesterolemia   . Asthma   . COPD (chronic obstructive pulmonary disease)   . Seizures     takes Gabapentin  (last one in 2010)    Past Surgical History  Procedure Laterality Date  . Wisdom tooth extraction    . Tonsillectomy Left 08/20/2014    Procedure: TONSILLECTOMY;  Surgeon: Ruby Cola, MD;  Location: [facility] OR;  Service: ENT;  Laterality: Left;  . Radical neck dissection Left 08/20/2014    Procedure: RADICAL LEFT NECK DISSECTION ;  Surgeon: Ruby Cola, MD;  Location: [place];  Service: ENT;  Laterality: Left;  . Tracheostomy tube placement N/A 08/20/2014    Procedure: TRACHEOSTOMY - AWAKE;  Surgeon: Ruby Cola, MD;  Location: [place];  Service: ENT;  Laterality: N/A;  . Gastrostomy tube placement    . Multiple extractions with alveoloplasty N/A 09/17/2014    Procedure: Extraction of tooth #'s [phone] with alveoloplasty,  mandibular left lingual torus reduction, and gross debridement of remaining teeth.;  Surgeon: Lenn Cal, DDS;  Location: [place];  Service: Oral Surgery;  Laterality: N/A;    There were no vitals filed for this visit.  Visit Diagnosis:  Proximal muscle weakness  Weakness of both lower extremities  Decreased functional activity tolerance  Unsteadiness      Subjective Assessment - 03/03/15 0802    Subjective PT states she is walking but not counting her steps.  In her usual amount of pain.    Currently in Pain? Yes   Pain Score 6    Pain Location Head   Pain Orientation Left   Pain Descriptors / Indicators Aching   Pain Frequency Intermittent               [facility] Adult PT Treatment/Exercise - 03/03/15 0001    Exercises   Exercises Knee/Hip   Knee/Hip Exercises: Aerobic   Nustep 10 minutes flat surface level 2 UE/LE   Knee/Hip Exercises: Standing   Heel Raises 15 reps   Forward Lunges 10 reps   Side Lunges 10 reps   Lateral Step Up Both;10 reps;Step Height: 6"   Forward Step Up 10 reps;Step Height: 6"   Functional Squat 10  reps   SLS B max Rt 30 seconds; Lt 13    Other Standing Knee Exercises tandem stance x 4; with head turns    Other Standing Knee Exercises back to wall lift B UE into flexion x 10    Knee/Hip Exercises: Seated   Other Seated Knee/Hip Exercises cervical retraction, scapular retraction x 10 esch.   Sit to [employer] 20 reps                PT Education - 03/03/15 [id number]    Education provided Yes   Education Details posture given scapular and cervical retraction   Person(s) Educated Patient   Methods Explanation   Comprehension Verbalized understanding          PT Short Term Goals - 02/23/15 0917    PT SHORT TERM GOAL #1   Title Patient will experiecne no more than 5/10 pain and no more than 5/10 fatigue during all functional tasks and activities of at leaset 60 minutes in duration    Time 3   Period Weeks   Status On-going   PT  SHORT TERM GOAL #2   Title Patient will be able to maintain SLS on each leg for at least 20 seconds with no HHA    Time 3   Period Weeks   Status Achieved   PT SHORT TERM GOAL #3   Title Patient will be independent in correctly and consistently performing appropriate HEP, to be updated PRN    Time 3   Period Weeks   Status On-going           PT Long Term Goals - 02/23/15 0920    PT LONG TERM GOAL #1   Title Patient will demonstarte 5/5 strength in bilateral lower extremties and at least 4/5 strength in proximal muscles and core    Time 6   Period Weeks   Status On-going   PT LONG TERM GOAL #2   Title Patient will demonstrate improved functional activity tolerance as evidenced by an abiltiy to tolerate at least 15 minutes of exercise on Nustep on at least level 4    Time 6   Period Weeks   Status On-going   PT LONG TERM GOAL #3   Title Patient will be able to maintain SLS on each leg for at least 45 seconds    Time 6   Period Weeks   Status On-going   PT LONG TERM GOAL #4   Title Patient to report she is experiencing no more than 3/10 pain and 3/10 fatigue during functional tasks and activities of at least 90 minutes in duration    Time 6   Period Weeks   Status On-going               Plan - 03/03/15 0847    Clinical Impression Statement Added lunging and tandem stance to program.  Pt needed verbal cuing and supervision with tandem stance once head turns were added.  Pt main deficit appears to be balance.     PT Next Visit Plan begin stepping over hurdles and balance beam.        Problem List Patient Active Problem List   Diagnosis Date Noted  . 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 Therapeutic opioid induced constipation 09/16/2014  . 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 Malignant neoplasm of base of tongue 09/02/2014  . Malignant neoplasm of pharynx 08/20/2014   Rayetta Humphrey, PT CLT [phone] 03/03/2015, 8:49 AM  [facility]  [place], [place], [address] Phone: [phone]   Fax:  [phone]

## 2015-03-04 ENCOUNTER — Encounter (HOSPITAL_BASED_OUTPATIENT_CLINIC_OR_DEPARTMENT_OTHER): Payer: 59

## 2015-03-04 ENCOUNTER — Encounter (HOSPITAL_COMMUNITY): Payer: Self-pay

## 2015-03-04 VITALS — BP 117/80 | HR 71 | Temp 98.0°F | Resp 18

## 2015-03-04 DIAGNOSIS — E860 Dehydration: Secondary | ICD-10-CM

## 2015-03-04 MED ORDER — SODIUM CHLORIDE 0.9 % IV SOLN
INTRAVENOUS | Status: DC
  Administered 2015-03-04: 10:00:00 via INTRAVENOUS

## 2015-03-04 MED ORDER — HEPARIN SOD (PORK) LOCK FLUSH 100 UNIT/ML IV SOLN
INTRAVENOUS | Status: AC
  Filled 2015-03-04: qty 5

## 2015-03-04 NOTE — Patient Instructions (Signed)
[place] at [facility] Discharge Instructions  RECOMMENDATIONS MADE BY THE CONSULTANT AND ANY TEST RESULTS WILL BE SENT TO YOUR REFERRING PHYSICIAN.  IV fluids today. Return as scheduled for lab work.  Thank you for choosing [place] at [facility] to provide your oncology and hematology care.  To afford each patient quality time with our provider, please arrive at least 15 minutes before your scheduled appointment time.    You need to re-schedule your appointment should you arrive 10 or more minutes late.  We strive to give you quality time with our providers, and arriving late affects you and other patients whose appointments are after yours.  Also, if you no show three or more times for appointments you may be dismissed from the clinic at the providers discretion.     Again, thank you for choosing [facility].  Our hope is that these requests will decrease the amount of time that you wait before being seen by our physicians.       _____________________________________________________________  Should you have questions after your visit to [facility], please contact our office at [phone] between the hours of 8:30 a.m. and 4:30 p.m.  Voicemails left after 4:30 p.m. will not be returned until the following business day.  For prescription refill requests, have your pharmacy contact our office.

## 2015-03-04 NOTE — Progress Notes (Signed)
1215:  Pt reports she needs to leave, stating, "I'm in a time crunch here".  A&Ox4; VSS.  Discharged ambulatory.

## 2015-03-05 ENCOUNTER — Ambulatory Visit (HOSPITAL_COMMUNITY): Payer: 59

## 2015-03-05 ENCOUNTER — Telehealth (HOSPITAL_COMMUNITY): Payer: Self-pay | Admitting: Hematology & Oncology

## 2015-03-05 ENCOUNTER — Encounter (HOSPITAL_BASED_OUTPATIENT_CLINIC_OR_DEPARTMENT_OTHER): Payer: 59

## 2015-03-05 ENCOUNTER — Encounter: Payer: Self-pay | Admitting: Internal Medicine

## 2015-03-05 DIAGNOSIS — E860 Dehydration: Secondary | ICD-10-CM | POA: Diagnosis not present

## 2015-03-05 DIAGNOSIS — R29898 Other symptoms and signs involving the musculoskeletal system: Secondary | ICD-10-CM

## 2015-03-05 DIAGNOSIS — Z452 Encounter for adjustment and management of vascular access device: Secondary | ICD-10-CM | POA: Diagnosis not present

## 2015-03-05 DIAGNOSIS — D649 Anemia, unspecified: Secondary | ICD-10-CM

## 2015-03-05 DIAGNOSIS — C01 Malignant neoplasm of base of tongue: Secondary | ICD-10-CM | POA: Diagnosis not present

## 2015-03-05 DIAGNOSIS — M6289 Other specified disorders of muscle: Secondary | ICD-10-CM | POA: Diagnosis not present

## 2015-03-05 DIAGNOSIS — M6281 Muscle weakness (generalized): Secondary | ICD-10-CM

## 2015-03-05 DIAGNOSIS — C140 Malignant neoplasm of pharynx, unspecified: Secondary | ICD-10-CM

## 2015-03-05 DIAGNOSIS — R6889 Other general symptoms and signs: Secondary | ICD-10-CM

## 2015-03-05 DIAGNOSIS — R2681 Unsteadiness on feet: Secondary | ICD-10-CM

## 2015-03-05 LAB — OCCULT BLOOD X 1 CARD TO LAB, STOOL
FECAL OCCULT BLD: NEGATIVE
FECAL OCCULT BLD: NEGATIVE
FECAL OCCULT BLD: NEGATIVE

## 2015-03-05 LAB — CBC
HEMATOCRIT: 30.3 % — AB (ref 36.0–46.0)
HEMOGLOBIN: 10 g/dL — AB (ref 12.0–15.0)
MCH: 31 pg (ref 26.0–34.0)
MCHC: 33 g/dL (ref 30.0–36.0)
MCV: 93.8 fL (ref 78.0–100.0)
Platelets: 307 10*3/uL (ref 150–400)
RBC: 3.23 MIL/uL — AB (ref 3.87–5.11)
RDW: 16.6 % — ABNORMAL HIGH (ref 11.5–15.5)
WBC: 7.5 10*3/uL (ref 4.0–10.5)

## 2015-03-05 LAB — BASIC METABOLIC PANEL
Anion gap: 9 (ref 5–15)
BUN: 54 mg/dL — ABNORMAL HIGH (ref 6–20)
CHLORIDE: 96 mmol/L — AB (ref 101–111)
CO2: 25 mmol/L (ref 22–32)
CREATININE: 2.8 mg/dL — AB (ref 0.44–1.00)
Calcium: 9.2 mg/dL (ref 8.9–10.3)
GFR calc non Af Amer: 19 mL/min — ABNORMAL LOW (ref >60)
GFR, EST AFRICAN AMERICAN: 22 mL/min — AB (ref >60)
Glucose, Bld: 163 mg/dL — ABNORMAL HIGH (ref 65–99)
POTASSIUM: 4.6 mmol/L (ref 3.5–5.1)
SODIUM: 130 mmol/L — AB (ref 135–145)

## 2015-03-05 MED ORDER — SODIUM CHLORIDE 0.9 % IJ SOLN
10.0000 mL | INTRAMUSCULAR | Status: DC | PRN
  Administered 2015-03-05: 10 mL via INTRAVENOUS
  Filled 2015-03-05: qty 10

## 2015-03-05 MED ORDER — HEPARIN SOD (PORK) LOCK FLUSH 100 UNIT/ML IV SOLN
500.0000 [IU] | Freq: Once | INTRAVENOUS | Status: AC
  Administered 2015-03-05: 500 [IU] via INTRAVENOUS

## 2015-03-05 NOTE — Therapy (Signed)
[facility] [address] [place], [place], [address] Phone: [phone]   Fax:  [phone]  Physical Therapy Treatment  Patient Details  Name: Shelby Larson MRN: [id number] Date of Birth: 10-Apr-1966 Referring Provider:  Patrici Ranks, MD  Encounter Date: 03/05/2015    Past Medical History  Diagnosis Date  . Heart murmur     as a child  . Chronic bronchitis   . Anxiety     panic attacks  . Depression   . Headache     onset a few months ago  . Neuropathy     had it in both hands  . Arthritis   . Fibromyalgia   . 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 Anemia   . Rosacea   . Hypercholesterolemia   . Asthma   . COPD (chronic obstructive pulmonary disease)   . Seizures     takes Gabapentin  (last one in 2010)    Past Surgical History  Procedure Laterality Date  . Wisdom tooth extraction    . Tonsillectomy Left 08/20/2014    Procedure: TONSILLECTOMY;  Surgeon: Ruby Cola, MD;  Location: [facility] OR;  Service: ENT;  Laterality: Left;  . Radical neck dissection Left 08/20/2014    Procedure: RADICAL LEFT NECK DISSECTION ;  Surgeon: Ruby Cola, MD;  Location: [place];  Service: ENT;  Laterality: Left;  . Tracheostomy tube placement N/A 08/20/2014    Procedure: TRACHEOSTOMY - AWAKE;  Surgeon: Ruby Cola, MD;  Location: [place];  Service: ENT;  Laterality: N/A;  . Gastrostomy tube placement    . Multiple extractions with alveoloplasty N/A 09/17/2014    Procedure: Extraction of tooth #'s [phone] with alveoloplasty, mandibular left lingual torus reduction, and gross debridement of remaining teeth.;  Surgeon: Lenn Cal, DDS;  Location: [place];  Service: Oral Surgery;  Laterality: N/A;    There were no vitals filed for this visit.  Visit Diagnosis:  Pharynx cancer  Weakness of both lower extremities  Decreased functional activity tolerance  Unsteadiness  Proximal  muscle weakness      Subjective Assessment - 03/05/15 1116    Subjective Pt reports she is doing failrly well, performing HEP when she is at home.    Pertinent History Patient was diagnosed with tongue and pharynx cancer in February; had PEG and Portacath put in, also had trach. Has been getting chemo and radiation for her cancer, reports that hopefully she is done with these treatments. Reports that she is still not feeling good after her treatemnts even though the last one was around a month ago.    Patient Stated Goals get feeling better, get back up to par physically    Currently in Pain? Yes   Pain Score 6    Pain Location --  Throat and back                         [facility] Adult PT Treatment/Exercise - 03/05/15 0001    Knee/Hip Exercises: Standing   Heel Raises 2 sets;20 reps   Forward Lunges Both;2 sets;10 reps  4" step   Side Lunges Both;2 sets;10 reps  4" step   Functional Squat 10 reps;2 sets  chair for tactile cues   Other Standing Knee Exercises tandem stance + trunk rotation c 4000g balll  1x10 bilat   Knee/Hip Exercises: Seated   Other Seated Knee/Hip Exercises cervical  retraction 10x 3 seconds    scapular retraction 10x                PT Education - 03/05/15 1127    Education provided No          PT Short Term Goals - 02/23/15 0917    PT SHORT TERM GOAL #1   Title Patient will experiecne no more than 5/10 pain and no more than 5/10 fatigue during all functional tasks and activities of at leaset 60 minutes in duration    Time 3   Period Weeks   Status On-going   PT SHORT TERM GOAL #2   Title Patient will be able to maintain SLS on each leg for at least 20 seconds with no HHA    Time 3   Period Weeks   Status Achieved   PT SHORT TERM GOAL #3   Title Patient will be independent in correctly and consistently performing appropriate HEP, to be updated PRN    Time 3   Period Weeks   Status On-going           PT Long Term Goals  - 02/23/15 0920    PT LONG TERM GOAL #1   Title Patient will demonstarte 5/5 strength in bilateral lower extremties and at least 4/5 strength in proximal muscles and core    Time 6   Period Weeks   Status On-going   PT LONG TERM GOAL #2   Title Patient will demonstrate improved functional activity tolerance as evidenced by an abiltiy to tolerate at least 15 minutes of exercise on Nustep on at least level 4    Time 6   Period Weeks   Status On-going   PT LONG TERM GOAL #3   Title Patient will be able to maintain SLS on each leg for at least 45 seconds    Time 6   Period Weeks   Status On-going   PT LONG TERM GOAL #4   Title Patient to report she is experiencing no more than 3/10 pain and 3/10 fatigue during functional tasks and activities of at least 90 minutes in duration    Time 6   Period Weeks   Status On-going               Plan - 03/05/15 1127    Clinical Impression Statement Pt showing progress in strength and balance activites. Pt has had a very busy with medical appointments and treatments but is taking additional time to rest as need. Should continue to progress balance, dynamic stibaility activities.    Pt will benefit from skilled therapeutic intervention in order to improve on the following deficits Decreased endurance;Decreased activity tolerance;Decreased strength;Pain;Decreased balance;Decreased coordination;Postural dysfunction   Rehab Potential Good   PT Frequency 2x / week   PT Duration 6 weeks   PT Treatment/Interventions ADLs/Self Care Home Management;Gait training;Stair training;Functional mobility training;Therapeutic activities;Therapeutic exercise;Balance training;Neuromuscular re-education;Patient/family education;Manual techniques   PT Next Visit Plan begin stepping over hurdles and balance beam.        Problem List Patient Active Problem List   Diagnosis Date Noted  . 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 Therapeutic opioid induced constipation  09/16/2014  . 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 Malignant neoplasm of base of tongue 09/02/2014  . Malignant neoplasm of pharynx 08/20/2014    Sweta Halseth C 03/05/2015, 12:10 PM  12:10 PM  Etta Grandchild, PT, DPT [place] License # [id number]       [facility]  [place], [place], [address] Phone: [phone]   Fax:  [phone]

## 2015-03-05 NOTE — Progress Notes (Signed)
Shelby Larson presented for Portacath access and flush. Proper placement of portacath confirmed by CXR. Portacath located right chest wall accessed with  H 20 needle. Good blood return present.  Specimen drawn for labs.   Portacath flushed with 49ml NS and 500U/39ml Heparin and needle removed intact. Procedure without incident. Patient tolerated procedure well.  Hemmocult stool cards taken to the lab.

## 2015-03-07 ENCOUNTER — Encounter (HOSPITAL_COMMUNITY): Payer: Self-pay | Admitting: Hematology & Oncology

## 2015-03-08 ENCOUNTER — Other Ambulatory Visit (HOSPITAL_COMMUNITY): Payer: Self-pay | Admitting: Oncology

## 2015-03-08 DIAGNOSIS — N289 Disorder of kidney and ureter, unspecified: Secondary | ICD-10-CM

## 2015-03-08 DIAGNOSIS — D649 Anemia, unspecified: Secondary | ICD-10-CM

## 2015-03-09 ENCOUNTER — Encounter (HOSPITAL_COMMUNITY): Payer: Self-pay | Admitting: Lab

## 2015-03-09 ENCOUNTER — Ambulatory Visit (HOSPITAL_COMMUNITY): Payer: 59 | Admitting: Physical Therapy

## 2015-03-09 DIAGNOSIS — M6281 Muscle weakness (generalized): Secondary | ICD-10-CM

## 2015-03-09 DIAGNOSIS — C140 Malignant neoplasm of pharynx, unspecified: Secondary | ICD-10-CM

## 2015-03-09 DIAGNOSIS — M6289 Other specified disorders of muscle: Secondary | ICD-10-CM | POA: Diagnosis not present

## 2015-03-09 DIAGNOSIS — R2681 Unsteadiness on feet: Secondary | ICD-10-CM

## 2015-03-09 DIAGNOSIS — C029 Malignant neoplasm of tongue, unspecified: Secondary | ICD-10-CM

## 2015-03-09 DIAGNOSIS — R29898 Other symptoms and signs involving the musculoskeletal system: Secondary | ICD-10-CM

## 2015-03-09 DIAGNOSIS — R6889 Other general symptoms and signs: Secondary | ICD-10-CM

## 2015-03-09 NOTE — Therapy (Signed)
[facility], [place], [address] Phone: [phone]   Fax:  [phone]  Physical Therapy Treatment  Patient Details  Name: Shelby Larson MRN: [id number] Date of Birth: [birth year] Referring Provider:  Patrici Ranks, MD  Encounter Date: 03/09/2015      PT End of Session - 03/09/15 0837    Visit Number 4   Number of Visits 12   Authorization Type UHC (23 visit limit for PT)   Authorization Time Period 02/17/15 to 04/19/15   PT Start Time 0801   PT Stop Time 0840   PT Time Calculation (min) 39 min   Activity Tolerance Patient tolerated treatment well   Behavior During Therapy [facility] for tasks assessed/performed      Past Medical History  Diagnosis Date  . Heart murmur     as a child  . Chronic bronchitis   . Anxiety     panic attacks  . Depression   . Headache     onset a few months ago  . Neuropathy     had it in both hands  . Arthritis   . Fibromyalgia   . 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 Anemia   . Rosacea   . Hypercholesterolemia   . Asthma   . COPD (chronic obstructive pulmonary disease)   . Seizures     takes Gabapentin  (last one in 2010)    Past Surgical History  Procedure Laterality Date  . Wisdom tooth extraction    . Tonsillectomy Left 08/20/2014    Procedure: TONSILLECTOMY;  Surgeon: Ruby Cola, MD;  Location: [facility] OR;  Service: ENT;  Laterality: Left;  . Radical neck dissection Left 08/20/2014    Procedure: RADICAL LEFT NECK DISSECTION ;  Surgeon: Ruby Cola, MD;  Location: [place];  Service: ENT;  Laterality: Left;  . Tracheostomy tube placement N/A 08/20/2014    Procedure: TRACHEOSTOMY - AWAKE;  Surgeon: Ruby Cola, MD;  Location: [place];  Service: ENT;  Laterality: N/A;  . Gastrostomy tube placement    . Multiple extractions with alveoloplasty N/A 09/17/2014    Procedure: Extraction of tooth #'s [id number] with  alveoloplasty, mandibular left lingual torus reduction, and gross debridement of remaining teeth.;  Surgeon: Lenn Cal, DDS;  Location: [place];  Service: Oral Surgery;  Laterality: N/A;    There were no vitals filed for this visit.  Visit Diagnosis:  Weakness of both lower extremities  Decreased functional activity tolerance  Unsteadiness  Proximal muscle weakness  Pharynx cancer  Tongue cancer      Subjective Assessment - 03/09/15 0803    Subjective Patient reports that she is doing well today, is having some pain though    Pertinent History Patient was diagnosed with tongue and pharynx cancer in February; had PEG and Portacath put in, also had trach. Has been getting chemo and radiation for her cancer, reports that hopefully she is done with these treatments. Reports that she is still not feeling good after her treatemnts even though the last one was around a month ago.    Patient Stated Goals get feeling better, get back up to par physically    Currently in Pain? Yes   Pain Score 5    Pain Location Other (Comment)  throat and back  [facility] Adult PT Treatment/Exercise - 03/09/15 0001    Knee/Hip Exercises: Stretches   Active Hamstring Stretch Both;3 reps;30 seconds   Active Hamstring Stretch Limitations 12 inch box    Piriformis Stretch Both;2 reps;30 seconds   Piriformis Stretch Limitations seated    Gastroc Stretch Both;3 reps;30 seconds   Gastroc Stretch Limitations slantboard    Knee/Hip Exercises: Aerobic   Tread Mill 7.5 minutes at 1.5MPH,  5% incline    Knee/Hip Exercises: Standing   Heel Raises 20 reps;1 set   Heel Raises Limitations --   Forward Lunges Both;1 set;15 reps   Forward Lunges Limitations 4 inch box    Side Lunges Both;1 set;15 reps   Side Lunges Limitations 4 inch box    Functional Squat 15 reps   Other Standing Knee Exercises 3D hip excursions 1x10   Other Standing Knee Exercises Hip ABD walks 2x19ft               Balance Exercises - 03/09/15 0809    Balance Exercises: Standing   Tandem Stance Eyes closed;Foam/compliant surface;3 reps;15 secs   SLS Eyes open;Solid surface;3 reps   Wall Bumps Shoulder   Wall Bumps-Shoulders Eyes opened;Anterior/posterior;15 reps   Rockerboard Anterior/posterior;Lateral;Other (comment)  x20AP, x20 lateral no HHA    Retro Gait 4 reps;Other (comment)  4x44ft            PT Education - 03/09/15 0850    Education provided No          PT Short Term Goals - 02/23/15 0917    PT SHORT TERM GOAL #1   Title Patient will experiecne no more than 5/10 pain and no more than 5/10 fatigue during all functional tasks and activities of at leaset 60 minutes in duration    Time 3   Period Weeks   Status On-going   PT SHORT TERM GOAL #2   Title Patient will be able to maintain SLS on each leg for at least 20 seconds with no HHA    Time 3   Period Weeks   Status Achieved   PT SHORT TERM GOAL #3   Title Patient will be independent in correctly and consistently performing appropriate HEP, to be updated PRN    Time 3   Period Weeks   Status On-going           PT Long Term Goals - 02/23/15 0920    PT LONG TERM GOAL #1   Title Patient will demonstarte 5/5 strength in bilateral lower extremties and at least 4/5 strength in proximal muscles and core    Time 6   Period Weeks   Status On-going   PT LONG TERM GOAL #2   Title Patient will demonstrate improved functional activity tolerance as evidenced by an abiltiy to tolerate at least 15 minutes of exercise on Nustep on at least level 4    Time 6   Period Weeks   Status On-going   PT LONG TERM GOAL #3   Title Patient will be able to maintain SLS on each leg for at least 45 seconds    Time 6   Period Weeks   Status On-going   PT LONG TERM GOAL #4   Title Patient to report she is experiencing no more than 3/10 pain and 3/10 fatigue during functional tasks and activities of at least 90 minutes in  duration    Time 6   Period Weeks   Status On-going  Plan - 03/09/15 0837    Clinical Impression Statement Continued focus on strength and balance work; patient reports she is careful at home to avoid putting herself in positions that will compromise her balance and reports she has been having issues with her PEG coming out at night. Tolerated session well overall.    Pt will benefit from skilled therapeutic intervention in order to improve on the following deficits Decreased endurance;Decreased activity tolerance;Decreased strength;Pain;Decreased balance;Decreased coordination;Postural dysfunction   Rehab Potential Good   PT Frequency 2x / week   PT Duration 6 weeks   PT Treatment/Interventions ADLs/Self Care Home Management;Gait training;Stair training;Functional mobility training;Therapeutic activities;Therapeutic exercise;Balance training;Neuromuscular re-education;Patient/family education;Manual techniques   PT Next Visit Plan begin stepping over hurdles and balance beam.   PT Home Exercise Plan given    Consulted and Agree with Plan of Care Patient        Problem List Patient Active Problem List   Diagnosis Date Noted  . 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 Therapeutic opioid induced constipation 09/16/2014  . 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 Malignant neoplasm of base of tongue 09/02/2014  . Malignant neoplasm of pharynx 08/20/2014    Deniece Ree PT, DPT [place] [address] [place], [place], [address] Phone: [phone]   Fax:  [phone]

## 2015-03-09 NOTE — Progress Notes (Signed)
Referral sent to Dr Lowanda Foster.  Records faxed on 9/13

## 2015-03-11 ENCOUNTER — Other Ambulatory Visit (HOSPITAL_COMMUNITY): Payer: Self-pay | Admitting: Oncology

## 2015-03-11 ENCOUNTER — Ambulatory Visit (HOSPITAL_COMMUNITY): Payer: 59 | Admitting: Physical Therapy

## 2015-03-11 ENCOUNTER — Encounter (HOSPITAL_COMMUNITY): Payer: Self-pay

## 2015-03-11 ENCOUNTER — Encounter (HOSPITAL_BASED_OUTPATIENT_CLINIC_OR_DEPARTMENT_OTHER): Payer: 59

## 2015-03-11 VITALS — BP 115/75 | HR 70 | Resp 20

## 2015-03-11 DIAGNOSIS — M6281 Muscle weakness (generalized): Secondary | ICD-10-CM

## 2015-03-11 DIAGNOSIS — R635 Abnormal weight gain: Secondary | ICD-10-CM

## 2015-03-11 DIAGNOSIS — D649 Anemia, unspecified: Secondary | ICD-10-CM

## 2015-03-11 DIAGNOSIS — C140 Malignant neoplasm of pharynx, unspecified: Secondary | ICD-10-CM

## 2015-03-11 DIAGNOSIS — N289 Disorder of kidney and ureter, unspecified: Secondary | ICD-10-CM

## 2015-03-11 DIAGNOSIS — C01 Malignant neoplasm of base of tongue: Secondary | ICD-10-CM | POA: Diagnosis not present

## 2015-03-11 DIAGNOSIS — M6289 Other specified disorders of muscle: Secondary | ICD-10-CM | POA: Diagnosis not present

## 2015-03-11 DIAGNOSIS — R2681 Unsteadiness on feet: Secondary | ICD-10-CM

## 2015-03-11 DIAGNOSIS — R29898 Other symptoms and signs involving the musculoskeletal system: Secondary | ICD-10-CM

## 2015-03-11 DIAGNOSIS — Z95828 Presence of other vascular implants and grafts: Secondary | ICD-10-CM

## 2015-03-11 DIAGNOSIS — Z452 Encounter for adjustment and management of vascular access device: Secondary | ICD-10-CM | POA: Diagnosis not present

## 2015-03-11 DIAGNOSIS — R6889 Other general symptoms and signs: Secondary | ICD-10-CM

## 2015-03-11 DIAGNOSIS — J302 Other seasonal allergic rhinitis: Secondary | ICD-10-CM

## 2015-03-11 DIAGNOSIS — C029 Malignant neoplasm of tongue, unspecified: Secondary | ICD-10-CM

## 2015-03-11 LAB — BASIC METABOLIC PANEL
ANION GAP: 8 (ref 5–15)
BUN: 39 mg/dL — ABNORMAL HIGH (ref 6–20)
CALCIUM: 8.9 mg/dL (ref 8.9–10.3)
CHLORIDE: 90 mmol/L — AB (ref 101–111)
CO2: 32 mmol/L (ref 22–32)
Creatinine, Ser: 2.25 mg/dL — ABNORMAL HIGH (ref 0.44–1.00)
GFR calc Af Amer: 28 mL/min — ABNORMAL LOW (ref >60)
GFR calc non Af Amer: 25 mL/min — ABNORMAL LOW (ref >60)
GLUCOSE: 98 mg/dL (ref 65–99)
Potassium: 4.5 mmol/L (ref 3.5–5.1)
Sodium: 130 mmol/L — ABNORMAL LOW (ref 135–145)

## 2015-03-11 LAB — CBC WITH DIFFERENTIAL/PLATELET
BASOS ABS: 0 10*3/uL (ref 0.0–0.1)
Basophils Relative: 0 %
Eosinophils Absolute: 0.1 10*3/uL (ref 0.0–0.7)
Eosinophils Relative: 1 %
HEMATOCRIT: 28.6 % — AB (ref 36.0–46.0)
HEMOGLOBIN: 9.7 g/dL — AB (ref 12.0–15.0)
LYMPHS PCT: 5 %
Lymphs Abs: 0.6 10*3/uL — ABNORMAL LOW (ref 0.7–4.0)
MCH: 31.8 pg (ref 26.0–34.0)
MCHC: 33.9 g/dL (ref 30.0–36.0)
MCV: 93.8 fL (ref 78.0–100.0)
MONO ABS: 0.7 10*3/uL (ref 0.1–1.0)
MONOS PCT: 6 %
NEUTROS ABS: 10.2 10*3/uL — AB (ref 1.7–7.7)
Neutrophils Relative %: 88 %
Platelets: 283 10*3/uL (ref 150–400)
RBC: 3.05 MIL/uL — ABNORMAL LOW (ref 3.87–5.11)
RDW: 16.6 % — AB (ref 11.5–15.5)
WBC: 11.7 10*3/uL — ABNORMAL HIGH (ref 4.0–10.5)

## 2015-03-11 MED ORDER — HEPARIN SOD (PORK) LOCK FLUSH 100 UNIT/ML IV SOLN
500.0000 [IU] | Freq: Once | INTRAVENOUS | Status: AC
Start: 2015-03-11 — End: 2015-03-11
  Administered 2015-03-11: 500 [IU] via INTRAVENOUS

## 2015-03-11 MED ORDER — FLUTICASONE PROPIONATE 50 MCG/ACT NA SUSP: 2.0000 | Freq: Every day | NASAL | Status: DC

## 2015-03-11 MED ORDER — CYCLOBENZAPRINE HCL 10 MG PO TABS: 10.0000 mg | ORAL_TABLET | Freq: Three times a day (TID) | ORAL | Status: DC | PRN

## 2015-03-11 MED ORDER — SODIUM CHLORIDE 0.9 % IJ SOLN
10.0000 mL | INTRAMUSCULAR | Status: DC | PRN
  Administered 2015-03-11: 10 mL via INTRAVENOUS
  Filled 2015-03-11: qty 10

## 2015-03-11 MED ORDER — LORAZEPAM 1 MG PO TABS: ORAL_TABLET | ORAL | Status: DC

## 2015-03-11 NOTE — Therapy (Signed)
[facility] [place], [place], [address] Phone: [phone]   Fax:  [phone]  Physical Therapy Treatment  Patient Details  Name: Shelby Larson MRN: [id number] Date of Birth: 11-23-1965 Referring Provider:  Patrici Ranks, MD  Encounter Date: 03/11/2015      PT End of Session - 03/11/15 0837    Visit Number 5   Number of Visits 12   Authorization Type [employer] (23 visit limit for PT)   Authorization Time Period 02/17/15 to 04/19/15   PT Start Time 0803   PT Stop Time 0844   PT Time Calculation (min) 41 min   Activity Tolerance Patient tolerated treatment well   Behavior During Therapy [facility] for tasks assessed/performed      Past Medical History  Diagnosis Date  . Heart murmur     as a child  . Chronic bronchitis   . Anxiety     panic attacks  . Depression   . Headache     onset a few months ago  . Neuropathy     had it in both hands  . Arthritis   . Fibromyalgia   . 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 Anemia   . Rosacea   . Hypercholesterolemia   . Asthma   . COPD (chronic obstructive pulmonary disease)   . Seizures     takes Gabapentin  (last one in 2010)    Past Surgical History  Procedure Laterality Date  . Wisdom tooth extraction    . Tonsillectomy Left 08/20/2014    Procedure: TONSILLECTOMY;  Surgeon: Ruby Cola, MD;  Location: [facility] OR;  Service: ENT;  Laterality: Left;  . Radical neck dissection Left 08/20/2014    Procedure: RADICAL LEFT NECK DISSECTION ;  Surgeon: Ruby Cola, MD;  Location: [place];  Service: ENT;  Laterality: Left;  . Tracheostomy tube placement N/A 08/20/2014    Procedure: TRACHEOSTOMY - AWAKE;  Surgeon: Ruby Cola, MD;  Location: [place];  Service: ENT;  Laterality: N/A;  . Gastrostomy tube placement    . Multiple extractions with alveoloplasty N/A 09/17/2014    Procedure: Extraction of tooth #'s [id number] with  alveoloplasty, mandibular left lingual torus reduction, and gross debridement of remaining teeth.;  Surgeon: Lenn Cal, DDS;  Location: [place];  Service: Oral Surgery;  Laterality: N/A;    There were no vitals filed for this visit.  Visit Diagnosis:  Weakness of both lower extremities  Decreased functional activity tolerance  Unsteadiness  Proximal muscle weakness  Pharynx cancer  Tongue cancer      Subjective Assessment - 03/11/15 0804    Subjective Patient reports that she is having increased pain in her throat today, otherwise doing well    Pertinent History Patient was diagnosed with tongue and pharynx cancer in February; had PEG and Portacath put in, also had trach. Has been getting chemo and radiation for her cancer, reports that hopefully she is done with these treatments. Reports that she is still not feeling good after her treatemnts even though the last one was around a month ago.    Currently in Pain? Yes   Pain Score 8    Pain Location Throat                         [facility] Adult PT Treatment/Exercise - 03/11/15 0001    Knee/Hip Exercises: Stretches  Active Hamstring Stretch Both;3 reps;30 seconds   Active Hamstring Stretch Limitations 12 inch box    Gastroc Stretch Both;3 reps;30 seconds   Gastroc Stretch Limitations slantboard    Knee/Hip Exercises: Aerobic   Nustep 10 minutes flat surface level 2 UE/LE   Knee/Hip Exercises: Standing   Heel Raises Both;2 sets;15 reps   Heel Raises Limitations unilateral    Forward Step Up Both;1 set;10 reps   Forward Step Up Limitations 6 inch box    Step Down Both;1 set;10 reps   Step Down Limitations 4 inch box    Functional Squat 3 sets;10 reps   Functional Squat Limitations toes neutral, toes in, toes out    Other Standing Knee Exercises 3D hip excursions 1x10   Other Standing Knee Exercises Hamstring/quad pulls on horse 277ft each; hip ABD and extension holds 1x10 each LE, B HHA                Balance Exercises - 03/11/15 0806    Balance Exercises: Standing   Standing Eyes Closed Narrow base of support (BOS);3 reps;20 secs;Foam/compliant surface   Tandem Stance Eyes closed;Foam/compliant surface;3 reps;15 secs   SLS Eyes closed;Solid surface;3 reps;15 secs   Rockerboard Anterior/posterior;Lateral;Other (comment)  x20AP, x20 lateral no HHA    Cone Rotation Limitations cones in semi-circle with cues for which cone/which foot to tap            PT Education - 03/11/15 0836    Education provided Yes   Education Details visual/vestibular/proprioceptive components of balance    Person(s) Educated Patient   Methods Explanation   Comprehension Verbalized understanding          PT Short Term Goals - 02/23/15 0917    PT SHORT TERM GOAL #1   Title Patient will experiecne no more than 5/10 pain and no more than 5/10 fatigue during all functional tasks and activities of at leaset 60 minutes in duration    Time 3   Period Weeks   Status On-going   PT SHORT TERM GOAL #2   Title Patient will be able to maintain SLS on each leg for at least 20 seconds with no HHA    Time 3   Period Weeks   Status Achieved   PT SHORT TERM GOAL #3   Title Patient will be independent in correctly and consistently performing appropriate HEP, to be updated PRN    Time 3   Period Weeks   Status On-going           PT Long Term Goals - 02/23/15 0920    PT LONG TERM GOAL #1   Title Patient will demonstarte 5/5 strength in bilateral lower extremties and at least 4/5 strength in proximal muscles and core    Time 6   Period Weeks   Status On-going   PT LONG TERM GOAL #2   Title Patient will demonstrate improved functional activity tolerance as evidenced by an abiltiy to tolerate at least 15 minutes of exercise on Nustep on at least level 4    Time 6   Period Weeks   Status On-going   PT LONG TERM GOAL #3   Title Patient will be able to maintain SLS on each leg for at least 45 seconds     Time 6   Period Weeks   Status On-going   PT LONG TERM GOAL #4   Title Patient to report she is experiencing no more than 3/10 pain and 3/10 fatigue during functional tasks  and activities of at least 90 minutes in duration    Time 6   Period Weeks   Status On-going               Plan - 03/11/15 2979    Clinical Impression Statement Continued functional exercises and balance training today, with progression of functional exercises and to some extent balance exercises. Patient appeared to tolerate all exercises and tasks well today with occasional cues for form. Patient requested to weight herself and came in at 102.6 pounds today.    Pt will benefit from skilled therapeutic intervention in order to improve on the following deficits Decreased endurance;Decreased activity tolerance;Decreased strength;Pain;Decreased balance;Decreased coordination;Postural dysfunction   Rehab Potential Good   PT Frequency 2x / week   PT Duration 6 weeks   PT Treatment/Interventions ADLs/Self Care Home Management;Gait training;Stair training;Functional mobility training;Therapeutic activities;Therapeutic exercise;Balance training;Neuromuscular re-education;Patient/family education;Manual techniques   PT Next Visit Plan begin stepping over hurdles and balance beam.   PT Home Exercise Plan given    Consulted and Agree with Plan of Care Patient        Problem List Patient Active Problem List   Diagnosis Date Noted  . 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 Therapeutic opioid induced constipation 09/16/2014  . 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 Malignant neoplasm of base of tongue 09/02/2014  . Malignant neoplasm of pharynx 08/20/2014   Deniece Ree PT, DPT [facility] [address] [place], [place], [address] Phone: [phone]   Fax:  [phone]

## 2015-03-11 NOTE — Patient Instructions (Signed)
[place] at [facility] Discharge Instructions  RECOMMENDATIONS MADE BY THE CONSULTANT AND ANY TEST RESULTS WILL BE SENT TO YOUR REFERRING PHYSICIAN.  You received your port flush with labs drawn today per orders follow up as scheduled.  Thank you for choosing [place] at [facility] to provide your oncology and hematology care.  To afford each patient quality time with our provider, please arrive at least 15 minutes before your scheduled appointment time.    You need to re-schedule your appointment should you arrive 10 or more minutes late.  We strive to give you quality time with our providers, and arriving late affects you and other patients whose appointments are after yours.  Also, if you no show three or more times for appointments you may be dismissed from the clinic at the providers discretion.     Again, thank you for choosing [facility].  Our hope is that these requests will decrease the amount of time that you wait before being seen by our physicians.       _____________________________________________________________  Should you have questions after your visit to [facility], please contact our office at [phone] between the hours of 8:30 a.m. and 4:30 p.m.  Voicemails left after 4:30 p.m. will not be returned until the following business day.  For prescription refill requests, have your pharmacy contact our office.

## 2015-03-11 NOTE — Progress Notes (Signed)
Shelby Larson presented for Portacath access and flush.Shelby Larson presented for lab work. Labs per MD order drawn via Portacath located in the right chest wall accessed with  H 20 needle. Good blood return present. Procedure without incident.  Portacath flushed with 49ml NS and 500U/45ml Heparin per protocol and needle removed intact. Patient tolerated procedure well.

## 2015-03-16 ENCOUNTER — Ambulatory Visit (HOSPITAL_COMMUNITY): Payer: 59 | Admitting: Physical Therapy

## 2015-03-16 DIAGNOSIS — M6289 Other specified disorders of muscle: Secondary | ICD-10-CM | POA: Diagnosis not present

## 2015-03-16 DIAGNOSIS — R29898 Other symptoms and signs involving the musculoskeletal system: Secondary | ICD-10-CM

## 2015-03-16 DIAGNOSIS — M6281 Muscle weakness (generalized): Secondary | ICD-10-CM

## 2015-03-16 DIAGNOSIS — R2681 Unsteadiness on feet: Secondary | ICD-10-CM

## 2015-03-16 DIAGNOSIS — C029 Malignant neoplasm of tongue, unspecified: Secondary | ICD-10-CM

## 2015-03-16 DIAGNOSIS — C140 Malignant neoplasm of pharynx, unspecified: Secondary | ICD-10-CM

## 2015-03-16 DIAGNOSIS — R6889 Other general symptoms and signs: Secondary | ICD-10-CM

## 2015-03-16 NOTE — Patient Instructions (Signed)
   SINGLE LEG STANCE - SLS  Stand on one leg and maintain your balance. Hold for as long as you can and then switch to the other leg. Repeat twice each side, 2x/day.    TANDEM STANCE WITH SUPPORT  Stand in front of a chair, table or counter top for support. Then place the heel of one foot so that it is touching the toes of the other foot. Maintain your balance in this position.   Hold for as long as you can, then switch your feet; repeat twice each side, 2x/day.    STEP PLACEMENT  While standing with both feet on the floor, place one foot on the top of the step. Maintain your balance for as long as you can.  Next, return the foot back to the floor and then repeat with the other leg.   Maintain balance and be sure to get your heel on the step.   Repeat twice each leg, 2x/day.    Backwards walking  Standing up tall, begin walking backwards slowly and controlled. Focus on walking toe-heel.  Be sure to perform in a clear, safe area.    Repeat the distance of a clear hallway in your home 3 times, twice a day.

## 2015-03-16 NOTE — Therapy (Signed)
[facility] [address] [place], [place], [address] Phone: [phone]   Fax:  [phone]  Physical Therapy Treatment (Discharge Assessment)  Patient Details  Name: Shelby Larson MRN: [id number] Date of Birth: Sep 16, 1965 Referring Kentrell Guettler:  Patrici Ranks, MD  Encounter Date: 03/16/2015      PT End of Session - 03/16/15 0851    Visit Number 6   Number of Visits 6   Authorization Type U[employer] (23 visit limit for PT)   Authorization Time Period 02/17/15 to 04/19/15   PT Start Time 0811   PT Stop Time 0843   PT Time Calculation (min) 32 min   Activity Tolerance Patient tolerated treatment well   Behavior During Therapy [facility] for tasks assessed/performed      Past Medical History  Diagnosis Date  . Heart murmur     as a child  . Chronic bronchitis   . Anxiety     panic attacks  . Depression   . Headache     onset a few months ago  . Neuropathy     had it in both hands  . Arthritis   . Fibromyalgia   . 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 Anemia   . Rosacea   . Hypercholesterolemia   . Asthma   . COPD (chronic obstructive pulmonary disease)   . Seizures     takes Gabapentin  (last one in 2010)    Past Surgical History  Procedure Laterality Date  . Wisdom tooth extraction    . Tonsillectomy Left 08/20/2014    Procedure: TONSILLECTOMY;  Surgeon: Ruby Cola, MD;  Location: [facility] OR;  Service: ENT;  Laterality: Left;  . Radical neck dissection Left 08/20/2014    Procedure: RADICAL LEFT NECK DISSECTION ;  Surgeon: Ruby Cola, MD;  Location: [place];  Service: ENT;  Laterality: Left;  . Tracheostomy tube placement N/A 08/20/2014    Procedure: TRACHEOSTOMY - AWAKE;  Surgeon: Ruby Cola, MD;  Location: [place];  Service: ENT;  Laterality: N/A;  . Gastrostomy tube placement    . Multiple extractions with alveoloplasty N/A 09/17/2014    Procedure: Extraction of tooth #'s  [id number] with alveoloplasty, mandibular left lingual torus reduction, and gross debridement of remaining teeth.;  Surgeon: Lenn Cal, DDS;  Location: [place];  Service: Oral Surgery;  Laterality: N/A;    There were no vitals filed for this visit.  Visit Diagnosis:  Weakness of both lower extremities  Decreased functional activity tolerance  Unsteadiness  Proximal muscle weakness  Pharynx cancer  Tongue cancer      Subjective Assessment - 03/16/15 0811    Subjective Patient reports that she is doing well today, no pain however feels like she is staying about the same regardless of skilled PT services    Pertinent History Patient was diagnosed with tongue and pharynx cancer in February; had PEG and Portacath put in, also had trach. Has been getting chemo and radiation for her cancer, reports that hopefully she is done with these treatments. Reports that she is still not feeling good after her treatemnts even though the last one was around a month ago.    Patient Stated Goals get feeling better, get back up to par physically    Currently in Pain? No/denies            [facility] PT Assessment - 03/16/15 0001    Observation/Other Assessments  Observations SLS 55 seconds L, 6 seconds R    Strength   Right Hip Flexion 4+/5   Right Hip Extension 4/5  measured in sidelying    Right Hip ABduction 4+/5   Left Hip Flexion 4+/5   Left Hip Extension --  measured  in sidelying    Left Hip ABduction 4/5   Right Knee Flexion 4+/5   Right Knee Extension 5/5   Left Knee Flexion 5/5   Left Knee Extension 5/5   Right Ankle Dorsiflexion 5/5   Left Ankle Dorsiflexion 5/5   6 minute walk test results    Endurance additional comments 1275.8 ft in 6 minute walk; gait speed 1.66ms     Timed Up and Go Test   TUG Comments TUG 7.8, 8.0, 7.8                     [facility] Adult PT Treatment/Exercise - 03/16/15 0001    Knee/Hip Exercises: Aerobic   Nustep 5 minute UE/LE level  3, UE/LE                 PT Education - 03/16/15 0851    Education provided Yes   Education Details updated HEP for use after DC today    Person(s) Educated Patient   Methods Explanation   Comprehension Verbalized understanding          PT Short Term Goals - 03/16/15 0829    PT SHORT TERM GOAL #1   Title Patient will experiecne no more than 5/10 pain and no more than 5/10 fatigue during all functional tasks and activities of at leaset 60 minutes in duration    Baseline 9/20- pain still getting high, mostly in trach area, still very fatigued after doing something for one hour    Time 3   Period Weeks   Status On-going   PT SHORT TERM GOAL #2   Title Patient will be able to maintain SLS on each leg for at least 20 seconds with no HHA    Baseline 9/20- L 55 seconds, R 6 seconds    Time 3   Period Weeks   Status Achieved   PT SHORT TERM GOAL #3   Title Patient will be independent in correctly and consistently performing appropriate HEP, to be updated PRN    Time 3   Period Weeks   Status Achieved           PT Long Term Goals - 03/16/15 0831    PT LONG TERM GOAL #1   Title Patient will demonstarte 5/5 strength in bilateral lower extremties and at least 4/5 strength in proximal muscles and core    Time 6   Period Weeks   Status Achieved   PT LONG TERM GOAL #2   Title Patient will demonstrate improved functional activity tolerance as evidenced by an abiltiy to tolerate at least 15 minutes of exercise on Nustep on at least level 4    Time 6   Period Weeks   Status On-going   PT LONG TERM GOAL #3   Title Patient will be able to maintain SLS on each leg for at least 45 seconds    Baseline 9/20- 55 seconds L, 6 seconds R    Time 6   Period Weeks   Status Partially Met   PT LONG TERM GOAL #4   Title Patient to report she is experiencing no more than 3/10 pain and 3/10 fatigue during functional tasks and activities of at  least 90 minutes in duration    Time 6    Period Weeks   Status On-going               Plan - 03/16/15 3202    Clinical Impression Statement Discharge assessment performed today. Patient shows improvement in strength and SLS time, but no significant changes in balance (as evidenced by TUG time) or 6 MWT/gait speed. Patient reports that she has not noticed any significant changes since attending skilled PT services, and would like to DC to advanced HEP for now; she does state concern about possibly needing PT later on and was educated that all she will need to return is a new MD referral. At this time patient is appropriate for and requests to be DC-ed; she was given updated advanced HEP with focus on balance for home use.    Pt will benefit from skilled therapeutic intervention in order to improve on the following deficits Decreased endurance;Decreased activity tolerance;Decreased strength;Pain;Decreased balance;Decreased coordination;Postural dysfunction   Rehab Potential Good   PT Treatment/Interventions ADLs/Self Care Home Management;Gait training;Stair training;Functional mobility training;Therapeutic activities;Therapeutic exercise;Balance training;Neuromuscular re-education;Patient/family education;Manual techniques   PT Next Visit Plan DC today per patient request    PT Home Exercise Plan given    Consulted and Agree with Plan of Care Patient        Problem List Patient Active Problem List   Diagnosis Date Noted  . 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 Therapeutic opioid induced constipation 09/16/2014  . 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 Malignant neoplasm of base of tongue 09/02/2014  . Malignant neoplasm of pharynx 08/20/2014    PHYSICAL THERAPY DISCHARGE SUMMARY  Visits from Start of Care: 6  Current functional level related to goals / functional outcomes: Patient reports she is not noticing any major changes with skilled PT services and would like to be DCed at this time with advanced HEP for balance;  however she is open to returning to PT later on if her strength/balance/etc worsens.    Remaining deficits: Impaired balance, pain, impaired functional activity tolerance    Education / Equipment: Advanced HEP, advised that she is able to return to skilled PT services with new MD order  Plan: Patient agrees to discharge.  Patient goals were partially met. Patient is being discharged due to the patient's request.  ?????       Deniece Ree PT, DPT [place], [place], [address] Phone: [phone]   Fax:  [phone]

## 2015-03-17 ENCOUNTER — Encounter (HOSPITAL_COMMUNITY): Payer: Self-pay

## 2015-03-18 ENCOUNTER — Encounter (HOSPITAL_COMMUNITY): Payer: Self-pay

## 2015-03-18 ENCOUNTER — Encounter (HOSPITAL_BASED_OUTPATIENT_CLINIC_OR_DEPARTMENT_OTHER): Payer: 59

## 2015-03-18 ENCOUNTER — Ambulatory Visit (HOSPITAL_COMMUNITY): Payer: Self-pay | Admitting: Physical Therapy

## 2015-03-18 DIAGNOSIS — N289 Disorder of kidney and ureter, unspecified: Secondary | ICD-10-CM

## 2015-03-18 DIAGNOSIS — Z452 Encounter for adjustment and management of vascular access device: Secondary | ICD-10-CM

## 2015-03-18 DIAGNOSIS — D649 Anemia, unspecified: Secondary | ICD-10-CM

## 2015-03-18 DIAGNOSIS — C01 Malignant neoplasm of base of tongue: Secondary | ICD-10-CM | POA: Diagnosis not present

## 2015-03-18 LAB — CBC WITH DIFFERENTIAL/PLATELET
BASOS ABS: 0 10*3/uL (ref 0.0–0.1)
BASOS PCT: 0 %
Eosinophils Absolute: 0.2 10*3/uL (ref 0.0–0.7)
Eosinophils Relative: 2 %
HEMATOCRIT: 28.5 % — AB (ref 36.0–46.0)
HEMOGLOBIN: 9.4 g/dL — AB (ref 12.0–15.0)
LYMPHS PCT: 9 %
Lymphs Abs: 0.8 10*3/uL (ref 0.7–4.0)
MCH: 31.3 pg (ref 26.0–34.0)
MCHC: 33 g/dL (ref 30.0–36.0)
MCV: 95 fL (ref 78.0–100.0)
MONO ABS: 0.8 10*3/uL (ref 0.1–1.0)
MONOS PCT: 9 %
NEUTROS ABS: 7.1 10*3/uL (ref 1.7–7.7)
NEUTROS PCT: 80 %
Platelets: 255 10*3/uL (ref 150–400)
RBC: 3 MIL/uL — ABNORMAL LOW (ref 3.87–5.11)
RDW: 16.9 % — AB (ref 11.5–15.5)
WBC: 8.8 10*3/uL (ref 4.0–10.5)

## 2015-03-18 LAB — BASIC METABOLIC PANEL
ANION GAP: 8 (ref 5–15)
BUN: 40 mg/dL — ABNORMAL HIGH (ref 6–20)
CHLORIDE: 93 mmol/L — AB (ref 101–111)
CO2: 31 mmol/L (ref 22–32)
Calcium: 8.7 mg/dL — ABNORMAL LOW (ref 8.9–10.3)
Creatinine, Ser: 2.18 mg/dL — ABNORMAL HIGH (ref 0.44–1.00)
GFR calc non Af Amer: 26 mL/min — ABNORMAL LOW (ref >60)
GFR, EST AFRICAN AMERICAN: 30 mL/min — AB (ref >60)
GLUCOSE: 69 mg/dL (ref 65–99)
Potassium: 3.9 mmol/L (ref 3.5–5.1)
Sodium: 132 mmol/L — ABNORMAL LOW (ref 135–145)

## 2015-03-18 MED ORDER — SODIUM CHLORIDE 0.9 % IJ SOLN
10.0000 mL | INTRAMUSCULAR | Status: DC | PRN
  Administered 2015-03-18: 10 mL via INTRAVENOUS
  Filled 2015-03-18: qty 10

## 2015-03-18 MED ORDER — HEPARIN SOD (PORK) LOCK FLUSH 100 UNIT/ML IV SOLN
500.0000 [IU] | Freq: Once | INTRAVENOUS | Status: AC
  Administered 2015-03-18: 500 [IU] via INTRAVENOUS

## 2015-03-18 MED ORDER — HEPARIN SOD (PORK) LOCK FLUSH 100 UNIT/ML IV SOLN
INTRAVENOUS | Status: AC
  Filled 2015-03-18: qty 5

## 2015-03-18 NOTE — Patient Instructions (Signed)
Port flush with labs Follow up as scheduled  Please call the clinic if you have any questions or concerns

## 2015-03-18 NOTE — Progress Notes (Signed)
Shelby Larson presented for Portacath access and flush.  Proper placement of portacath confirmed by CXR.  Portacath located right chest wall accessed with  H 20 needle.  Good blood return present. Portacath flushed with 6ml NS and 500U/65ml Heparin and needle removed intact.  Procedure tolerated well and without incident.

## 2015-03-23 ENCOUNTER — Ambulatory Visit (HOSPITAL_COMMUNITY): Payer: Self-pay | Admitting: Physical Therapy

## 2015-03-23 ENCOUNTER — Encounter: Payer: Self-pay | Admitting: Nurse Practitioner

## 2015-03-23 ENCOUNTER — Other Ambulatory Visit: Payer: Self-pay

## 2015-03-23 ENCOUNTER — Ambulatory Visit (INDEPENDENT_AMBULATORY_CARE_PROVIDER_SITE_OTHER): Payer: 59 | Admitting: Nurse Practitioner

## 2015-03-23 VITALS — BP 100/71 | HR 82 | Temp 96.9°F | Ht 66.0 in | Wt 104.4 lb

## 2015-03-23 DIAGNOSIS — D649 Anemia, unspecified: Secondary | ICD-10-CM | POA: Diagnosis not present

## 2015-03-23 MED ORDER — PEG 3350-KCL-NA BICARB-NACL 420 G PO SOLR: 4000.0000 mL | ORAL | Status: DC

## 2015-03-23 NOTE — Assessment & Plan Note (Signed)
Patient with documented anemia without iron deficiency. Heme stool cards were negative 3. 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 She currently has a trach in place. Decreased by mouth intake due to pain and burning when eating. She does have a PEG tube supplement her nutrition, although she is able to eat. She would technically be due for initial colonoscopy in 1 year. At this point we'll proceed with a early initial colonoscopy to evaluate. Symptoms persist or develop of an upper GI nature we can consider endoscopy in the future. Anemia is likely multifactorial in nature given decline in renal function status post chemotherapy, chemotherapy side effects, and cancer. Return for follow-up in 3 months.  Proceed with TCS in the OR with propofol/MAC with Dr. Gala Romney in near future: the risks, benefits, and alternatives have been discussed with the patient in detail. The patient states understanding and desires to proceed.  The patient is not on any anticoagulants. She is on Flexeril, fentanyl Duragesic patch, Neurontin, Ativan for pain related to cancer. She does have a trach and is not oxygen dependent. For these reasons we'll proceed with the procedure and the OR on propofol/MAC to ensure adequate sedation.

## 2015-03-23 NOTE — Progress Notes (Signed)
Primary Care Physician:  [name], MD Primary Gastroenterologist:  Dr.   Laurel Dimmer Complaint  Patient presents with  . Anemia    HPI:   lll  Today she denies hematochezia, melena. 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 Has had some recent weight loss with malfunctioning PEG tube, which has been changed out 2 weeks ago. She is using it now. Eats minimal po foods due to pain/burning sensation with eating due to chemotherapy. Has had improved bowel movements with Bristol scale 4 since doing vegetable and berry smoothies. Occasional diarrhea. Denies fever, chills. Admits occasional nocturnal dyspnea. Sleeps on 1 pillow. Denies chest pain, dizziness, lightheadedness, syncope, near syncope. Denies any other upper or lower GI symptoms.   Past Medical History  Diagnosis Date  . Heart murmur     as a child  . Chronic bronchitis   . Anxiety     panic attacks  . Depression   . Headache     onset a few months ago  . Neuropathy     had it in both hands  . Arthritis   . Fibromyalgia   . 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 Anemia   . Rosacea   . Hypercholesterolemia   . Asthma   . COPD (chronic obstructive pulmonary disease)   . Seizures     takes Gabapentin  (last one in 2010)    Past Surgical History  Procedure Laterality Date  . Wisdom tooth extraction    . Tonsillectomy Left 08/20/2014    Procedure: TONSILLECTOMY;  Surgeon: Ruby Cola, MD;  [facility] OR;  Service: ENT;  Laterality: Left;  . Radical neck dissection Left 08/20/2014    Procedure: RADICAL LEFT NECK DISSECTION ;  Surgeon: Ruby Cola, MD;  Location: [place];  Service: ENT;  Laterality: Left;  . Tracheostomy tube placement N/A 08/20/2014    Procedure: TRACHEOSTOMY - AWAKE;  Surgeon: Ruby Cola, MD;  Location: [place];  Service: ENT;  Laterality: N/A;  .  Gastrostomy tube placement    . Multiple extractions with alveoloplasty N/A 09/17/2014    Procedure: Extraction of tooth #'s [phone] with alveoloplasty, mandibular left lingual torus reduction, and gross debridement of remaining teeth.;  Surgeon: Lenn Cal, DDS;  Location: [place];  Service: Oral Surgery;  Laterality: N/A;    Current Outpatient Prescriptions  Medication Sig Dispense Refill  . cyclobenzaprine (FLEXERIL) 10 MG tablet Take 1 tablet (10 mg total) by mouth 3 (three) times daily as needed for muscle spasms. 30 tablet 1  . escitalopram (LEXAPRO) 20 MG tablet Take 1/2 tablet by mouth for 5 days then increase to one tablet thereafter 30 tablet 3  . fentaNYL (DURAGESIC - DOSED MCG/HR) 75 MCG/HR Place 1 patch (75 mcg total) onto the skin every 3 (three) days. 10 patch 0  . fluticasone (FLONASE) 50 MCG/ACT nasal spray Place 2 sprays into both nostrils daily. 16 g 2  . gabapentin (NEURONTIN) 600 MG tablet Take 600 mg by mouth 3 (three) times daily.    Marland Kitchen HYDROcodone-acetaminophen (NORCO) 10-325 MG per tablet Take 1 tablet by mouth every 6 (six) hours as needed. 60 tablet 0  . lidocaine-prilocaine (EMLA) cream Apply a quarter size amount to port site 1 hour prior to chemo. Do not rub in. Cover with plastic wrap. 30 g 2  . LORazepam (ATIVAN) 1  MG tablet Take 2 tablets every 4 hours as needed for anxiety or nausea. 60 tablet 1  . naloxegol oxalate (MOVANTIK) 25 MG TABS tablet Take 1 tablet (25 mg total) by mouth every morning. 30 tablet 4  . Nutritional Supplements (FEEDING SUPPLEMENT, JEVITY 1.5 CAL/FIBER,) LIQD 5 cans. Nocturnal feed. Rate100 ml/hr x 12 hours. Provides. 1778 kcals, 76 g pro, 900 mls fluid.  Flush 180 mls 5x a day.    . prochlorperazine (COMPAZINE) 10 MG tablet Take 1 tablet (10 mg total) by mouth every 6 (six) hours as needed for nausea or vomiting. 60 tablet 2  . sodium fluoride (PREVIDENT 5000 PLUS) 1.1 % CREA dental cream Apply to tooth brush. Brush teeth for 2  minutes. Spit out excess-DO NOT swallow. Repeat nightly. 1 Tube prn  . sucralfate (CARAFATE) 1 G tablet Dissolve 1 tablet in 10 mL H20 and swallow 30 min prior to meals and bedtime. 60 tablet 5  . tetrahydrozoline-zinc (VISINE-AC) 0.05-0.25 % ophthalmic solution Place 2 drops into both eyes daily as needed (dry eyes).    . valACYclovir (VALTREX) 500 MG tablet Take 1 tablet (500 mg total) by mouth 2 (two) times daily. 60 tablet 3  . pantoprazole (PROTONIX) 40 MG tablet Take 1 tablet (40 mg total) by mouth daily. (Patient not taking: Reported on 12/21/2014) 30 tablet 2  . Potassium Bicarb-Citric Acid 20 MEQ TBEF Use via peg tube twice daily (Patient not taking: Reported on 12/21/2014) 120 each 3  . QUEtiapine (SEROQUEL) 50 MG tablet Take 1 tablet (50 mg total) by mouth daily as needed (sleep). (Patient not taking: Reported on 02/05/2015) 30 tablet 1   No current facility-administered medications for this visit.    Allergies as of 03/23/2015 - Review Complete 03/23/2015  Allergen Reaction Noted  . Bee venom Swelling 08/20/2014  . Penicillins Other (See Comments) 08/08/2014  . Latex Swelling and Rash 08/08/2014    Family History  Problem Relation Age of Onset  . Neuropathy Mother   . Hypertension Mother   . Alcoholism Father   . Alcoholism Sister     Social History   Social History  . Marital Status: Single    Spouse Name: N/A  . Number of Children: N/A  . Years of Education: N/A   Occupational History  . Not on file.   Social History Main Topics  . Smoking status: Former Smoker -- 0.50 packs/day for 20 years    Types: Cigarettes    Quit date: 06/18/2014  . Smokeless tobacco: Never Used  . Alcohol Use: Yes     Comment: occasional, 09/02/14 no longer using  . Drug Use: No  . Sexual Activity: Not on file   Other Topics Concern  . Not on file   Social History Narrative    Review of Systems: General: Negative for anorexia, fever, chills. Eyes: Negative for vision changes.    ENT: Negative for hoarseness, difficulty swallowing. Admits pain with eating primarily on her tongue due to chemo CV: Negative for chest pain, angina, palpitations, peripheral edema.  Respiratory: Negative for dyspnea at rest, cough, sputum, wheezing.  GI: See history of present illness. Derm: Negative for rash or itching.  Endo: Negative for unusual weight change.  Heme: Negative for bruising or bleeding. Allergy: Negative for rash or hives.    Physical Exam: BP 100/71 mmHg  Pulse 82  Temp(Src) 96.9 F (36.1 C)  Ht 5\' 6"  (1.676 m)  Wt 104 lb 6.4 oz (47.356 kg)  BMI 16.86 kg/m2  LMP  02/15/2015 General:   Alert and oriented. Pleasant and cooperative. Well-nourished and well-developed.  Head:  Normocephalic and atraumatic. Eyes:  Without icterus, sclera clear and conjunctiva pink.  Ears:  Normal auditory acuity. Cardiovascular:  S1, S2 present without murmurs appreciated. Normal pulses noted. Extremities without clubbing or edema. Respiratory:  Clear to auscultation bilaterally. Trach in place. No wheezes, rales, or rhonchi. No distress.  Gastrointestinal:  +BS, soft, non-tender and non-distended. No HSM noted. No guarding or rebound. No masses appreciated. PEG tube noted to abdominal wall, no drainage, erythema, or edema noted. Rectal:  Deferred  Neurologic:  Alert and oriented x4;  grossly normal neurologically. Psych:  Alert and cooperative. Normal mood and affect.   03/23/2015 1:52 PM

## 2015-03-23 NOTE — Patient Instructions (Signed)
1. We'll schedule your procedure for you. 2. Return for follow-up in 3 months. 3. Further recommendations to be based on results of your procedure.

## 2015-03-23 NOTE — Progress Notes (Signed)
CC'D TO PCP °

## 2015-03-25 ENCOUNTER — Encounter (HOSPITAL_BASED_OUTPATIENT_CLINIC_OR_DEPARTMENT_OTHER): Payer: 59

## 2015-03-25 ENCOUNTER — Ambulatory Visit (HOSPITAL_COMMUNITY): Payer: Self-pay | Admitting: Physical Therapy

## 2015-03-25 ENCOUNTER — Other Ambulatory Visit (HOSPITAL_COMMUNITY): Payer: Self-pay | Admitting: Hematology & Oncology

## 2015-03-25 DIAGNOSIS — R635 Abnormal weight gain: Secondary | ICD-10-CM

## 2015-03-25 DIAGNOSIS — D649 Anemia, unspecified: Secondary | ICD-10-CM

## 2015-03-25 DIAGNOSIS — C140 Malignant neoplasm of pharynx, unspecified: Secondary | ICD-10-CM

## 2015-03-25 DIAGNOSIS — C01 Malignant neoplasm of base of tongue: Secondary | ICD-10-CM | POA: Diagnosis not present

## 2015-03-25 DIAGNOSIS — Z452 Encounter for adjustment and management of vascular access device: Secondary | ICD-10-CM | POA: Diagnosis not present

## 2015-03-25 DIAGNOSIS — N289 Disorder of kidney and ureter, unspecified: Secondary | ICD-10-CM

## 2015-03-25 LAB — CBC WITH DIFFERENTIAL/PLATELET
BASOS PCT: 0 %
Basophils Absolute: 0 10*3/uL (ref 0.0–0.1)
Eosinophils Absolute: 0.2 10*3/uL (ref 0.0–0.7)
Eosinophils Relative: 2 %
HEMATOCRIT: 26.2 % — AB (ref 36.0–46.0)
HEMOGLOBIN: 8.7 g/dL — AB (ref 12.0–15.0)
LYMPHS ABS: 0.6 10*3/uL — AB (ref 0.7–4.0)
Lymphocytes Relative: 6 %
MCH: 31.8 pg (ref 26.0–34.0)
MCHC: 33.2 g/dL (ref 30.0–36.0)
MCV: 95.6 fL (ref 78.0–100.0)
MONO ABS: 0.9 10*3/uL (ref 0.1–1.0)
MONOS PCT: 7 %
NEUTROS ABS: 10 10*3/uL — AB (ref 1.7–7.7)
Neutrophils Relative %: 85 %
Platelets: 289 10*3/uL (ref 150–400)
RBC: 2.74 MIL/uL — ABNORMAL LOW (ref 3.87–5.11)
RDW: 17.1 % — AB (ref 11.5–15.5)
WBC: 11.7 10*3/uL — ABNORMAL HIGH (ref 4.0–10.5)

## 2015-03-25 LAB — BASIC METABOLIC PANEL
Anion gap: 8 (ref 5–15)
BUN: 43 mg/dL — AB (ref 6–20)
CALCIUM: 8.6 mg/dL — AB (ref 8.9–10.3)
CHLORIDE: 91 mmol/L — AB (ref 101–111)
CO2: 32 mmol/L (ref 22–32)
CREATININE: 2.36 mg/dL — AB (ref 0.44–1.00)
GFR calc non Af Amer: 23 mL/min — ABNORMAL LOW (ref >60)
GFR, EST AFRICAN AMERICAN: 27 mL/min — AB (ref >60)
GLUCOSE: 83 mg/dL (ref 65–99)
Potassium: 4.4 mmol/L (ref 3.5–5.1)
Sodium: 131 mmol/L — ABNORMAL LOW (ref 135–145)

## 2015-03-25 MED ORDER — SODIUM CHLORIDE 0.9 % IJ SOLN
10.0000 mL | INTRAMUSCULAR | Status: AC | PRN
  Administered 2015-03-25: 10 mL via INTRAVENOUS
  Filled 2015-03-25: qty 10

## 2015-03-25 MED ORDER — HEPARIN SOD (PORK) LOCK FLUSH 100 UNIT/ML IV SOLN
500.0000 [IU] | Freq: Once | INTRAVENOUS | Status: AC
  Administered 2015-03-25: 500 [IU] via INTRAVENOUS

## 2015-03-25 MED ORDER — HEPARIN SOD (PORK) LOCK FLUSH 100 UNIT/ML IV SOLN
INTRAVENOUS | Status: AC
  Filled 2015-03-25: qty 5

## 2015-03-25 MED ORDER — HYDROCODONE-ACETAMINOPHEN 10-325 MG PO TABS: 1.0000 | ORAL_TABLET | Freq: Four times a day (QID) | ORAL | Status: DC | PRN

## 2015-03-25 NOTE — Progress Notes (Unsigned)
Shelby Larson presented for Portacath access and flush. Proper placement of portacath confirmed by CXR. Portacath located right chest wall accessed with  H 20 needle. Good blood return present.  Specimen drawn for lab. Portacath flushed with 34ml NS and 500U/59ml Heparin and needle removed intact. Procedure without incident. Patient tolerated procedure well.

## 2015-03-30 ENCOUNTER — Ambulatory Visit (HOSPITAL_COMMUNITY): Payer: Self-pay | Admitting: Physical Therapy

## 2015-03-31 ENCOUNTER — Encounter (HOSPITAL_COMMUNITY): Payer: 59

## 2015-03-31 ENCOUNTER — Encounter (HOSPITAL_COMMUNITY): Payer: 59 | Attending: Hematology & Oncology | Admitting: Oncology

## 2015-03-31 DIAGNOSIS — Z87891 Personal history of nicotine dependence: Secondary | ICD-10-CM | POA: Diagnosis not present

## 2015-03-31 DIAGNOSIS — D631 Anemia in chronic kidney disease: Secondary | ICD-10-CM

## 2015-03-31 DIAGNOSIS — D649 Anemia, unspecified: Secondary | ICD-10-CM

## 2015-03-31 DIAGNOSIS — N289 Disorder of kidney and ureter, unspecified: Secondary | ICD-10-CM

## 2015-03-31 DIAGNOSIS — C01 Malignant neoplasm of base of tongue: Secondary | ICD-10-CM | POA: Diagnosis present

## 2015-03-31 DIAGNOSIS — F329 Major depressive disorder, single episode, unspecified: Secondary | ICD-10-CM

## 2015-03-31 DIAGNOSIS — F32A Depression, unspecified: Secondary | ICD-10-CM

## 2015-03-31 DIAGNOSIS — N189 Chronic kidney disease, unspecified: Secondary | ICD-10-CM

## 2015-03-31 LAB — RENAL FUNCTION PANEL
ALBUMIN: 3.6 g/dL (ref 3.5–5.0)
Anion gap: 9 (ref 5–15)
BUN: 38 mg/dL — AB (ref 6–20)
CO2: 30 mmol/L (ref 22–32)
CREATININE: 2.02 mg/dL — AB (ref 0.44–1.00)
Calcium: 8.8 mg/dL — ABNORMAL LOW (ref 8.9–10.3)
Chloride: 94 mmol/L — ABNORMAL LOW (ref 101–111)
GFR, EST AFRICAN AMERICAN: 32 mL/min — AB (ref >60)
GFR, EST NON AFRICAN AMERICAN: 28 mL/min — AB (ref >60)
Glucose, Bld: 75 mg/dL (ref 65–99)
PHOSPHORUS: 4.1 mg/dL (ref 2.5–4.6)
POTASSIUM: 4 mmol/L (ref 3.5–5.1)
Sodium: 133 mmol/L — ABNORMAL LOW (ref 135–145)

## 2015-03-31 LAB — CBC WITH DIFFERENTIAL/PLATELET
BASOS ABS: 0 10*3/uL (ref 0.0–0.1)
BASOS PCT: 0 %
Eosinophils Absolute: 0.1 10*3/uL (ref 0.0–0.7)
Eosinophils Relative: 2 %
HEMATOCRIT: 24.3 % — AB (ref 36.0–46.0)
Hemoglobin: 8.2 g/dL — ABNORMAL LOW (ref 12.0–15.0)
LYMPHS PCT: 7 %
Lymphs Abs: 0.6 10*3/uL — ABNORMAL LOW (ref 0.7–4.0)
MCH: 32.4 pg (ref 26.0–34.0)
MCHC: 33.7 g/dL (ref 30.0–36.0)
MCV: 96 fL (ref 78.0–100.0)
Monocytes Absolute: 0.6 10*3/uL (ref 0.1–1.0)
Monocytes Relative: 7 %
NEUTROS ABS: 6.6 10*3/uL (ref 1.7–7.7)
NEUTROS PCT: 84 %
Platelets: 267 10*3/uL (ref 150–400)
RBC: 2.53 MIL/uL — AB (ref 3.87–5.11)
RDW: 16.7 % — AB (ref 11.5–15.5)
WBC: 7.9 10*3/uL (ref 4.0–10.5)

## 2015-03-31 MED ORDER — HEPARIN SOD (PORK) LOCK FLUSH 100 UNIT/ML IV SOLN
INTRAVENOUS | Status: AC
  Filled 2015-03-31: qty 5

## 2015-03-31 MED ORDER — DULOXETINE HCL 30 MG PO CPEP: ORAL_CAPSULE | ORAL | Status: DC

## 2015-03-31 NOTE — Assessment & Plan Note (Addendum)
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    She is undergoing evaluation for anemia from a GI perspective to rule out GI blood loss as a cause or contributor.  She was recently seen by Dr. Lowanda Foster (nephrology) for evaluation for renal disease.  We are performing additional lab work for him today, according to notes I have received today.  Will start Aranesp at renal dosing 40 mcg every 2 weeks.  Supportive therapy plan built.  We will start next week to allow for insurance approval.  Labs every 2 weeks: CBC   Labs every 6 weeks: Ferritin  Return in 8 weeks for follow-up and evaluation of effectiveness of Aranesp.

## 2015-03-31 NOTE — Assessment & Plan Note (Addendum)
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  S/P concomitant chemoradiation consisting of Cisplatin every 21 days in a curative fashion.  PET imaging following completion of treatment in August 2016 shows NED.  Oncology history updated.  Patient did not tolerate Lexapro well.  As a result, we will discontinue and change to Cymbalta.  Rx printed.

## 2015-03-31 NOTE — Progress Notes (Signed)
[name], MD [place] [address]  Malignant neoplasm of base of tongue ([place]) - Plan: CBC with Differential, Renal function panel, Vitamin D 25 hydroxy, PTH, intact and calcium, Protein / Creatinine Ratio, Urine, CBC with Differential, Renal function panel, Vitamin D 25 hydroxy, PTH, intact and calcium, CBC with Differential, Basic metabolic panel  Anemia in chronic renal disease - Plan: CBC with Differential, Renal function panel, Vitamin D 25 hydroxy, PTH, intact and calcium, Protein / Creatinine Ratio, Urine, CBC with Differential, Renal function panel, Vitamin D 25 hydroxy, PTH, intact and calcium, CBC with Differential, Basic metabolic panel, Ferritin, CANCELED: Erythropoietin  Depression - Plan: DULoxetine (CYMBALTA) 30 MG capsule  CURRENT THERAPY: Surveillance  INTERVAL HISTORY: Shelby Larson 49 y.o. 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 S/P concomitant chemoradiation consisting of Cisplatin every 21 days in a curative fashion.  PET imaging following completion of treatment in August 2016 shows NED.    Malignant neoplasm of base of tongue ([place])   08/20/2014 Surgery Diagnosis 1. Tongue, biopsy, left base/ pharynx mass - INVASIVE MODERATELY DIFFERENTIATED SQUAMOUS CELL CARCINOMA. - SEE COMMENT. 2. Lymph nodes, radical neck dissection, Left - THREE OF SIXTEEN LYMPH NODES POSITIVE FOR METASTATIC SQUAMOUS CELL [facility]   09/18/2014 Surgery Multiple extraction of tooth numbers 2, 14, 15, 18, and 31.  4 Quadrants of alveoloplasty.  Gross debridement of remaining dentition. Mandibular left lingual torus reduction   10/08/2014 - 11/19/2014 Chemotherapy Cisplatin every 21 days   10/09/2014 - 11/30/2014 Radiation Therapy [name] with twice  daily dosing at the end of treatment at patient's request due to trip to [facility] against medical advice.   02/22/2015 PET scan Prior oropharyngeal mass has essentially resolved.  No findings specific for metastatic disease.   02/22/2015 Remission     I personally reviewed and went over laboratory results with the patient.  The results are noted within this dictation.  She has been seen by GI and is planned for colonoscopy later this month for persistent and progressive anemia.  Additionally, she has been seen and evaluated by Dr. Lowanda Foster (nephrology).  I have heard that we are doing additional lab work for him at his request.  She has been evaluated for an anemia of vitamin deficiency via serum testing and this has been unimpressive.    Past Medical History  Diagnosis Date  . Heart murmur     as a child  . Chronic bronchitis   . Anxiety     panic attacks  . Depression   . Headache     onset a few months ago  . Neuropathy     had it in both hands  . Arthritis   . Fibromyalgia   . 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 Anemia   . Rosacea   . Hypercholesterolemia   . Asthma   . COPD (chronic obstructive pulmonary disease)   . 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     is allergic to bee venom;  penicillins; and latex.  Current Outpatient Prescriptions on File Prior to Visit  Medication Sig Dispense Refill  . cyclobenzaprine (FLEXERIL) 10 MG tablet Take 1 tablet (10 mg total) by mouth 3 (three) times daily as needed for muscle spasms. 30 tablet 1  . fentaNYL (DURAGESIC - DOSED MCG/HR) 75 MCG/HR Place 1 patch (75 mcg total) onto the skin every 3 (three) days. 10 patch 0  . fluticasone (FLONASE) 50 MCG/ACT nasal spray Place 2  sprays into both nostrils daily. 16 g 2  . gabapentin (NEURONTIN) 600 MG tablet Take 600 mg by mouth 3 (three) times daily.    Marland Kitchen HYDROcodone-acetaminophen (NORCO) 10-325 MG tablet Take 1 tablet by mouth every 6 (six) hours as needed. 60 tablet 0  . lidocaine-prilocaine (EMLA) cream Apply a quarter size amount to port site 1 hour prior to chemo. Do not rub in. Cover with plastic wrap. 30 g 2  . LORazepam (ATIVAN) 1 MG tablet Take 2 tablets every 4 hours as needed for anxiety or nausea. 60 tablet 1  . naloxegol oxalate (MOVANTIK) 25 MG TABS tablet Take 1 tablet (25 mg total) by mouth every morning. 30 tablet 4  . Nutritional Supplements (FEEDING SUPPLEMENT, JEVITY 1.5 CAL/FIBER,) LIQD 5 cans. Nocturnal feed. Rate100 ml/hr x 12 hours. Provides. 1778 kcals, 76 g pro, 900 mls fluid.  Flush 180 mls 5x a day.    . pantoprazole (PROTONIX) 40 MG tablet Take 1 tablet (40 mg total) by mouth daily. (Patient not taking: Reported on 12/21/2014) 30 tablet 2  . polyethylene glycol-electrolytes (TRILYTE) 420 G solution Take 4,000 mLs by mouth as directed. 4000 mL 0  . Potassium Bicarb-Citric Acid 20 MEQ TBEF Use via peg tube twice daily (Patient not taking: Reported on 12/21/2014) 120 each 3  . prochlorperazine (COMPAZINE) 10 MG tablet Take 1 tablet (10 mg total) by mouth every 6 (six) hours as needed for nausea or vomiting. 60 tablet 2  . QUEtiapine (SEROQUEL) 50 MG tablet Take 1 tablet (50 mg total) by mouth daily as needed (sleep). (Patient not taking: Reported on 02/05/2015) 30 tablet 1  . sodium fluoride (PREVIDENT 5000 PLUS) 1.1 % CREA dental cream Apply to tooth brush. Brush teeth for 2 minutes. Spit out excess-DO NOT swallow. Repeat nightly. 1 Tube prn  . sucralfate (CARAFATE) 1 G tablet Dissolve 1 tablet in 10 mL H20 and swallow 30 min prior to meals and bedtime. 60 tablet 5  . tetrahydrozoline-zinc (VISINE-AC) 0.05-0.25 % ophthalmic solution Place 2 drops into both eyes daily as needed (dry eyes).    .  valACYclovir (VALTREX) 500 MG tablet Take 1 tablet (500 mg total) by mouth 2 (two) times daily. 60 tablet 3   Current Facility-Administered Medications on File Prior to Visit  Medication Dose Route Frequency Provider Last Rate Last Dose  . sodium chloride 0.9 % injection 10 mL  10 mL Intravenous PRN Patrici Ranks, MD   10 mL at 03/25/15 1530    Past Surgical History  Procedure Laterality Date  . Wisdom tooth extraction    . Tonsillectomy Left 08/20/2014    Procedure: TONSILLECTOMY;  Surgeon: Ruby Cola, MD;  Location: [facility] OR;  Service: ENT;  Laterality: Left;  . Radical neck dissection Left 08/20/2014    Procedure: RADICAL LEFT NECK DISSECTION ;  Surgeon: Ruby Cola, MD;  Location: [place];  Service: ENT;  Laterality: Left;  . Tracheostomy tube placement N/A 08/20/2014    Procedure: TRACHEOSTOMY - AWAKE;  Surgeon: Ruby Cola, MD;  Location: [facility]  OR;  Service: ENT;  Laterality: N/A;  . Gastrostomy tube placement    . Multiple extractions with alveoloplasty N/A 09/17/2014    Procedure: Extraction of tooth #'s [phone] with alveoloplasty, mandibular left lingual torus reduction, and gross debridement of remaining teeth.;  Surgeon: Lenn Cal, DDS;  Location: [place];  Service: Oral Surgery;  Laterality: N/A;    Denies any headaches, dizziness, double vision, fevers, chills, night sweats, nausea, vomiting, diarrhea, constipation, chest pain, heart palpitations, shortness of breath, blood in stool, black tarry stool, urinary pain, urinary burning, urinary frequency, hematuria.   PHYSICAL EXAMINATION  ECOG PERFORMANCE STATUS: 1 - Symptomatic but completely ambulatory  There were no vitals filed for this visit.  GENERAL:alert, no distress, comfortable, cooperative, smiling and appearing better, accompanied by her mother. SKIN: skin color, texture, turgor are normal, no rashes or significant lesions HEAD: Normocephalic, No masses, lesions, tenderness or abnormalities EYES:  normal, PERRLA, EOMI, Conjunctiva are pink and non-injected EARS: External ears normal OROPHARYNX:lips, buccal mucosa, and tongue normal and mucous membranes are moist  NECK: supple, no adenopathy, trachea midline LYMPH:  not examined BREAST:not examined LUNGS: clear to auscultation  HEART: regular rate & rhythm, no murmurs and no gallops ABDOMEN:abdomen soft, non-tender and normal bowel sounds BACK: Back symmetric, no curvature., No CVA tenderness EXTREMITIES:less then 2 second capillary refill, no joint deformities, effusion, or inflammation, no skin discoloration, no cyanosis  NEURO: alert & oriented x 3 with fluent speech, no focal motor/sensory deficits, gait normal    LABORATORY DATA: CBC    Component Value Date/Time   WBC 7.9 03/31/2015 1500   WBC 11.3* 09/02/2014 1007   RBC 2.53* 03/31/2015 1500   RBC 3.22* 03/02/2015 1448   RBC 3.76 09/02/2014 1007   HGB 8.2* 03/31/2015 1500   HGB 11.1* 09/02/2014 1007   HCT 24.3* 03/31/2015 1500   HCT 34.8 09/02/2014 1007   PLT 267 03/31/2015 1500   PLT 396 09/02/2014 1007   MCV 96.0 03/31/2015 1500   MCV 92.6 09/02/2014 1007   MCH 32.4 03/31/2015 1500   MCH 29.5 09/02/2014 1007   MCHC 33.7 03/31/2015 1500   MCHC 31.9 09/02/2014 1007   RDW 16.7* 03/31/2015 1500   RDW 15.4* 09/02/2014 1007   LYMPHSABS 0.6* 03/31/2015 1500   LYMPHSABS 1.5 09/02/2014 1007   MONOABS 0.6 03/31/2015 1500   MONOABS 1.1* 09/02/2014 1007   EOSABS 0.1 03/31/2015 1500   EOSABS 0.2 09/02/2014 1007   BASOSABS 0.0 03/31/2015 1500   BASOSABS 0.0 09/02/2014 1007      Chemistry      Component Value Date/Time   NA 131* 03/25/2015 1510   NA 137 09/02/2014 1007   K 4.4 03/25/2015 1510   K 4.5 09/02/2014 1007   CL 91* 03/25/2015 1510   CO2 32 03/25/2015 1510   CO2 27 09/02/2014 1007   BUN 43* 03/25/2015 1510   BUN 10.7 09/02/2014 1007   CREATININE 2.36* 03/25/2015 1510   CREATININE 0.8 09/02/2014 1007      Component Value Date/Time   CALCIUM 8.6*  03/25/2015 1510   CALCIUM 10.1 09/02/2014 1007   ALKPHOS 68 02/23/2015 0930   AST 13* 02/23/2015 0930   ALT 7* 02/23/2015 0930   BILITOT 0.5 02/23/2015 0930        PENDING LABS:   RADIOGRAPHIC STUDIES:  Ir Replc Gastro/colonic Tube Percut W/fluoro  03/03/2015   CLINICAL DATA:  Routine gastrostomy tube exchange  EXAM: GASTROSTOMY CATHETER REPLACEMENT  PROCEDURE: The existing gastrostomy tube was cut and  exchanged over the Amplatz wire for a new 14 [place] MPD. It was looped and string fixed in the stomach. Contrast was injected. It was then flushed with saline.  FINDINGS: Tip of the new gastrostomy tube is positioned in the body of the stomach.  IMPRESSION: Successful gastrostomy tube exchange.   Electronically Signed   By: Marybelle Killings M.D.   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    She is undergoing evaluation for anemia from a GI perspective to rule out GI blood loss as a cause or contributor.  She was recently seen by Dr. Lowanda Foster (nephrology) for evaluation for renal disease.  We are performing additional lab work for him today, according to notes I have received today.  Will start Aranesp at renal dosing 40 mcg every 2 weeks.  Supportive therapy plan built.  We will start next week to allow for insurance approval.  Labs every 2 weeks: CBC   Labs every 6 weeks: Ferritin  Return in 8 weeks for follow-up and evaluation of effectiveness of Aranesp.   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  S/P concomitant chemoradiation consisting of Cisplatin every 21 days in a curative fashion.  PET  imaging following completion of treatment in August 2016 shows NED.  Oncology history updated.  Patient did not tolerate Lexapro well.  As a result, we will discontinue and change to Cymbalta.  Rx printed.   THERAPY PLAN:  NCCN guidelines for surveillance of Head and Neck cancer recommends:  A. H+P every 1-3 months for year 1  B. H+P every 2-6 months for year 2  C. H+P every 4-8 months for years 3-5  D. H+P every year for years greater than 5  E. Imaging only as clinically indicated following baseline imaging study within 6 months of completion of therapy.   F.Complete head and neck exam; mirror and fiberoptic examination as clinically indicated.  G. TSH every 6-12 months.  H. Chest imaging as clinically indicated for patients with smoking history  I. Speech/hearing and swallowing evaluation and rehabilitation as clinically indicated.  J. Smoking cessation and EtOH counseling as clinically indicated.  K. Dental evaluation: recommended for oral cavity and sites exposed to significant intraoral radiation treatment.  L. Consider EBV DNA monitoring for nasopharyngeal cancer.    All questions were answered. The patient knows to call the clinic with any problems, questions or concerns. We can certainly see the patient much sooner if necessary.  Patient and plan discussed with Dr. Ancil Linsey and she is in agreement with the aforementioned.   This note is electronically signed by: Doy Mince 03/31/2015 3:38 PM

## 2015-03-31 NOTE — Patient Instructions (Signed)
[place] at [facility] Discharge Instructions  RECOMMENDATIONS MADE BY THE CONSULTANT AND ANY TEST RESULTS WILL BE SENT TO YOUR REFERRING PHYSICIAN.  Return to see Dr.  Muse in 9 weeks.   We will start you on Aranesp next week pending insurance approval.  You will receive this every two weeks. We will continue to draw labs at 2 and 4 week intervals for follow up.   Your Cymbalta was increased to 60mg .  Please take prescription to your pharmacy.      Thank you for choosing [place] at [facility] to provide your oncology and hematology care.  To afford each patient quality time with our provider, please arrive at least 15 minutes before your scheduled appointment time.    You need to re-schedule your appointment should you arrive 10 or more minutes late.  We strive to give you quality time with our providers, and arriving late affects you and other patients whose appointments are after yours.  Also, if you no show three or more times for appointments you may be dismissed from the clinic at the providers discretion.     Again, thank you for choosing [facility].  Our hope is that these requests will decrease the amount of time that you wait before being seen by our physicians.       _____________________________________________________________  Should you have questions after your visit to [facility], please contact our office at [phone] between the hours of 8:30 a.m. and 4:30 p.m.  Voicemails left after 4:30 p.m. will not be returned until the following business day.  For prescription refill requests, have your pharmacy contact our office.

## 2015-03-31 NOTE — Progress Notes (Signed)
See office visit encounter. 

## 2015-04-01 ENCOUNTER — Ambulatory Visit (HOSPITAL_COMMUNITY): Payer: Self-pay | Admitting: Physical Therapy

## 2015-04-01 LAB — PTH, INTACT AND CALCIUM
CALCIUM TOTAL (PTH): 9.2 mg/dL (ref 8.7–10.2)
PTH: 47 pg/mL (ref 15–65)

## 2015-04-01 LAB — VITAMIN D 25 HYDROXY (VIT D DEFICIENCY, FRACTURES): VIT D 25 HYDROXY: 52.1 ng/mL (ref 30.0–100.0)

## 2015-04-06 ENCOUNTER — Telehealth (HOSPITAL_COMMUNITY): Payer: Self-pay | Admitting: Hematology & Oncology

## 2015-04-06 NOTE — Telephone Encounter (Signed)
PC TO [employer] [place] WITH MANDY. ?'D IF CPT M3559 PER MANDY AUTH IS NOT REQUIRED MUST SUBMIT MED RECS WITH CLAIM CALL REF# [phone]

## 2015-04-08 ENCOUNTER — Encounter (HOSPITAL_COMMUNITY): Payer: 59

## 2015-04-08 ENCOUNTER — Ambulatory Visit (HOSPITAL_COMMUNITY): Payer: Self-pay

## 2015-04-08 ENCOUNTER — Other Ambulatory Visit (HOSPITAL_COMMUNITY): Payer: Self-pay | Admitting: Oncology

## 2015-04-08 ENCOUNTER — Encounter (HOSPITAL_BASED_OUTPATIENT_CLINIC_OR_DEPARTMENT_OTHER): Payer: 59

## 2015-04-08 ENCOUNTER — Encounter (HOSPITAL_COMMUNITY): Payer: Self-pay

## 2015-04-08 VITALS — BP 114/71 | HR 92 | Temp 98.5°F | Resp 18

## 2015-04-08 DIAGNOSIS — E875 Hyperkalemia: Secondary | ICD-10-CM

## 2015-04-08 DIAGNOSIS — C01 Malignant neoplasm of base of tongue: Secondary | ICD-10-CM

## 2015-04-08 DIAGNOSIS — N289 Disorder of kidney and ureter, unspecified: Secondary | ICD-10-CM

## 2015-04-08 DIAGNOSIS — D649 Anemia, unspecified: Secondary | ICD-10-CM

## 2015-04-08 DIAGNOSIS — N189 Chronic kidney disease, unspecified: Secondary | ICD-10-CM

## 2015-04-08 DIAGNOSIS — D631 Anemia in chronic kidney disease: Secondary | ICD-10-CM

## 2015-04-08 LAB — CBC WITH DIFFERENTIAL/PLATELET
BASOS ABS: 0 10*3/uL (ref 0.0–0.1)
Basophils Relative: 0 %
Eosinophils Absolute: 0.1 10*3/uL (ref 0.0–0.7)
Eosinophils Relative: 1 %
HEMATOCRIT: 23.7 % — AB (ref 36.0–46.0)
HEMOGLOBIN: 8 g/dL — AB (ref 12.0–15.0)
LYMPHS ABS: 0.5 10*3/uL — AB (ref 0.7–4.0)
LYMPHS PCT: 4 %
MCH: 33.2 pg (ref 26.0–34.0)
MCHC: 33.8 g/dL (ref 30.0–36.0)
MCV: 98.3 fL (ref 78.0–100.0)
Monocytes Absolute: 1.2 10*3/uL — ABNORMAL HIGH (ref 0.1–1.0)
Monocytes Relative: 8 %
NEUTROS ABS: 12.6 10*3/uL — AB (ref 1.7–7.7)
Neutrophils Relative %: 87 %
PLATELETS: 264 10*3/uL (ref 150–400)
RBC: 2.41 MIL/uL — AB (ref 3.87–5.11)
RDW: 17.6 % — ABNORMAL HIGH (ref 11.5–15.5)
WBC: 14.5 10*3/uL — AB (ref 4.0–10.5)

## 2015-04-08 LAB — BASIC METABOLIC PANEL
ANION GAP: 9 (ref 5–15)
BUN: 44 mg/dL — ABNORMAL HIGH (ref 6–20)
CO2: 31 mmol/L (ref 22–32)
Calcium: 9.3 mg/dL (ref 8.9–10.3)
Chloride: 89 mmol/L — ABNORMAL LOW (ref 101–111)
Creatinine, Ser: 1.94 mg/dL — ABNORMAL HIGH (ref 0.44–1.00)
GFR calc Af Amer: 34 mL/min — ABNORMAL LOW (ref >60)
GFR calc non Af Amer: 29 mL/min — ABNORMAL LOW (ref >60)
GLUCOSE: 78 mg/dL (ref 65–99)
POTASSIUM: 5.4 mmol/L — AB (ref 3.5–5.1)
Sodium: 129 mmol/L — ABNORMAL LOW (ref 135–145)

## 2015-04-08 LAB — URINALYSIS, ROUTINE W REFLEX MICROSCOPIC
Bilirubin Urine: NEGATIVE
Glucose, UA: NEGATIVE mg/dL
Hgb urine dipstick: NEGATIVE
Ketones, ur: NEGATIVE mg/dL
Leukocytes, UA: NEGATIVE
NITRITE: NEGATIVE
PROTEIN: NEGATIVE mg/dL
SPECIFIC GRAVITY, URINE: 1.01 (ref 1.005–1.030)
UROBILINOGEN UA: 0.2 mg/dL (ref 0.0–1.0)
pH: 7 (ref 5.0–8.0)

## 2015-04-08 LAB — FERRITIN: Ferritin: 294 ng/mL (ref 11–307)

## 2015-04-08 MED ORDER — HEPARIN SOD (PORK) LOCK FLUSH 100 UNIT/ML IV SOLN
500.0000 [IU] | Freq: Once | INTRAVENOUS | Status: AC
  Administered 2015-04-08: 500 [IU] via INTRAVENOUS

## 2015-04-08 MED ORDER — DARBEPOETIN ALFA 40 MCG/0.4ML IJ SOSY
40.0000 ug | PREFILLED_SYRINGE | Freq: Once | INTRAMUSCULAR | Status: AC
  Administered 2015-04-08: 40 ug via SUBCUTANEOUS

## 2015-04-08 MED ORDER — DARBEPOETIN ALFA 40 MCG/0.4ML IJ SOSY
PREFILLED_SYRINGE | INTRAMUSCULAR | Status: AC
  Filled 2015-04-08: qty 0.4

## 2015-04-08 MED ORDER — SODIUM POLYSTYRENE SULFONATE PO POWD: Freq: Once | ORAL | Status: DC

## 2015-04-08 MED ORDER — SODIUM CHLORIDE 0.9 % IJ SOLN
10.0000 mL | INTRAMUSCULAR | Status: DC | PRN
  Administered 2015-04-08: 10 mL via INTRAVENOUS
  Filled 2015-04-08: qty 10

## 2015-04-08 NOTE — Progress Notes (Signed)
See 04/08/15 port flush encounter.

## 2015-04-08 NOTE — Progress Notes (Signed)
1345:  Shelby Larson presented for Portacath access and flush.  Portacath located right chest wall accessed with  H 20 needle.  Good blood return present, labs drawn from site. Portacath flushed with 21ml NS and 500U/40ml Heparin and needle removed intact.  Procedure tolerated well and without incident.    1420:  [name] presents today for injection per the provider's orders.  Aranesp administration without incident; see MAR for injection details.  Patient tolerated procedure well and without incident.  No questions or complaints noted at this time.

## 2015-04-08 NOTE — Patient Instructions (Signed)
[place] at [facility] Discharge Instructions  RECOMMENDATIONS MADE BY THE CONSULTANT AND ANY TEST RESULTS WILL BE SENT TO YOUR REFERRING PHYSICIAN.  Labs and port flush today. Aranesp injection today.  Return as scheduled for lab work and injections.   Thank you for choosing [place] at [facility] to provide your oncology and hematology care.  To afford each patient quality time with our provider, please arrive at least 15 minutes before your scheduled appointment time.    You need to re-schedule your appointment should you arrive 10 or more minutes late.  We strive to give you quality time with our providers, and arriving late affects you and other patients whose appointments are after yours.  Also, if you no show three or more times for appointments you may be dismissed from the clinic at the providers discretion.     Again, thank you for choosing [facility].  Our hope is that these requests will decrease the amount of time that you wait before being seen by our physicians.       _____________________________________________________________  Should you have questions after your visit to [facility], please contact our office at [phone] between the hours of 8:30 a.m. and 4:30 p.m.  Voicemails left after 4:30 p.m. will not be returned until the following business day.  For prescription refill requests, have your pharmacy contact our office.

## 2015-04-08 NOTE — Patient Instructions (Signed)
TAWONA FILSINGER  04/08/2015     @PREFPERIOPPHARMACY @   Your procedure is scheduled on  04/15/2015   Report to [facility] at  58  A.M.  Call this number if you have problems the morning of surgery:  [phone]   Remember:  Do not eat food or drink liquids after midnight.  Take these medicines the morning of surgery with A SIP OF WATER  Flexaril, cymbalta, gabapentin, hydrocodone, ativan, compazine, valtrex.   Do not wear jewelry, make-up or nail polish.  Do not wear lotions, powders, or perfumes.  You may wear deodorant.  Do not shave 48 hours prior to surgery.  Men may shave face and neck.  Do not bring valuables to the hospital.  [facility] is not responsible for any belongings or valuables.  Contacts, dentures or bridgework may not be worn into surgery.  Leave your suitcase in the car.  After surgery it may be brought to your room.  For patients admitted to the hospital, discharge time will be determined by your treatment team.  Patients discharged the day of surgery will not be allowed to drive home.   Name and phone number of your driver:   family Special instructions:  Follow the diet and prep instructions given to you by Dr Roseanne Kaufman office.  Please read over the following fact sheets that you were given. Pain Booklet, Coughing and Deep Breathing, Surgical Site Infection Prevention, Anesthesia Post-op Instructions and Care and Recovery After Surgery      Colonoscopy A colonoscopy is an exam to look at the entire large intestine (colon). This exam can help find problems such as tumors, polyps, inflammation, and areas of bleeding. The exam takes about 1 hour.  LET [facility] CARE PROVIDER KNOW ABOUT:   Any allergies you have.  All medicines you are taking, including vitamins, herbs, eye drops, creams, and over-the-counter medicines.  Previous problems you or members of your family have had with the use of anesthetics.  Any blood disorders you  have.  Previous surgeries you have had.  Medical conditions you have. RISKS AND COMPLICATIONS  Generally, this is a safe procedure. However, as with any procedure, complications can occur. Possible complications include:  Bleeding.  Tearing or rupture of the colon wall.  Reaction to medicines given during the exam.  Infection (rare). BEFORE THE PROCEDURE   Ask your health care provider about changing or stopping your regular medicines.  You may be prescribed an oral bowel prep. This involves drinking a large amount of medicated liquid, starting the day before your procedure. The liquid will cause you to have multiple loose stools until your stool is almost clear or light green. This cleans out your colon in preparation for the procedure.  Do not eat or drink anything else once you have started the bowel prep, unless your health care provider tells you it is safe to do so.  Arrange for someone to drive you home after the procedure. PROCEDURE   You will be given medicine to help you relax (sedative).  You will lie on your side with your knees bent.  A long, flexible tube with a light and camera on the end (colonoscope) will be inserted through the rectum and into the colon. The camera sends video back to a computer screen as it moves through the colon. The colonoscope also releases carbon dioxide gas to inflate the colon. This helps your health care provider see the area better.  During the  exam, your health care provider may take a small tissue sample (biopsy) to be examined under a microscope if any abnormalities are found.  The exam is finished when the entire colon has been viewed. AFTER THE PROCEDURE   Do not drive for 24 hours after the exam.  You may have a small amount of blood in your stool.  You may pass moderate amounts of gas and have mild abdominal cramping or bloating. This is caused by the gas used to inflate your colon during the exam.  Ask when your test  results will be ready and how you will get your results. Make sure you get your test results.   This information is not intended to replace advice given to you by your health care provider. Make sure you discuss any questions you have with your health care provider.   Document Released: 06/09/2000 Document Revised: 04/02/2013 Document Reviewed: 02/17/2013 Elsevier Interactive Patient Education 2016 [employer] PATIENT INSTRUCTIONS POST-ANESTHESIA  IMMEDIATELY FOLLOWING SURGERY:  Do not drive or operate machinery for the first twenty four hours after surgery.  Do not make any important decisions for twenty four hours after surgery or while taking narcotic pain medications or sedatives.  If you develop intractable nausea and vomiting or a severe headache please notify your doctor immediately.  FOLLOW-UP:  Please make an appointment with your surgeon as instructed. You do not need to follow up with anesthesia unless specifically instructed to do so.  WOUND CARE INSTRUCTIONS (if applicable):  Keep a dry clean dressing on the anesthesia/puncture wound site if there is drainage.  Once the wound has quit draining you may leave it open to air.  Generally you should leave the bandage intact for twenty four hours unless there is drainage.  If the epidural site drains for more than 36-48 hours please call the anesthesia department.  QUESTIONS?:  Please feel free to call your physician or the hospital operator if you have any questions, and they will be happy to assist you.

## 2015-04-12 ENCOUNTER — Encounter (HOSPITAL_COMMUNITY): Payer: Self-pay

## 2015-04-12 ENCOUNTER — Encounter (HOSPITAL_COMMUNITY)
Admission: RE | Admit: 2015-04-12 | Discharge: 2015-04-12 | Disposition: A | Discharge status: Home / Self Care | Payer: 59 | Source: Ambulatory Visit | Attending: Internal Medicine | Admitting: Internal Medicine

## 2015-04-12 ENCOUNTER — Other Ambulatory Visit: Payer: Self-pay

## 2015-04-12 DIAGNOSIS — M1990 Unspecified osteoarthritis, unspecified site: Secondary | ICD-10-CM | POA: Diagnosis not present

## 2015-04-12 DIAGNOSIS — Z91030 Bee allergy status: Secondary | ICD-10-CM | POA: Diagnosis not present

## 2015-04-12 DIAGNOSIS — Z85810 Personal history of malignant neoplasm of tongue: Secondary | ICD-10-CM | POA: Diagnosis not present

## 2015-04-12 DIAGNOSIS — M797 Fibromyalgia: Secondary | ICD-10-CM | POA: Diagnosis not present

## 2015-04-12 DIAGNOSIS — D649 Anemia, unspecified: Secondary | ICD-10-CM | POA: Diagnosis present

## 2015-04-12 DIAGNOSIS — Z91040 Latex allergy status: Secondary | ICD-10-CM | POA: Diagnosis not present

## 2015-04-12 DIAGNOSIS — Z880 Allergy status to penicillin: Secondary | ICD-10-CM | POA: Diagnosis not present

## 2015-04-12 DIAGNOSIS — F329 Major depressive disorder, single episode, unspecified: Secondary | ICD-10-CM | POA: Diagnosis not present

## 2015-04-12 DIAGNOSIS — Z79899 Other long term (current) drug therapy: Secondary | ICD-10-CM | POA: Diagnosis not present

## 2015-04-12 DIAGNOSIS — E7800 Pure hypercholesterolemia, unspecified: Secondary | ICD-10-CM | POA: Diagnosis not present

## 2015-04-12 DIAGNOSIS — G629 Polyneuropathy, unspecified: Secondary | ICD-10-CM | POA: Diagnosis not present

## 2015-04-12 DIAGNOSIS — F410 Panic disorder [episodic paroxysmal anxiety] without agoraphobia: Secondary | ICD-10-CM | POA: Diagnosis not present

## 2015-04-12 DIAGNOSIS — D122 Benign neoplasm of ascending colon: Secondary | ICD-10-CM | POA: Diagnosis not present

## 2015-04-12 DIAGNOSIS — Z87891 Personal history of nicotine dependence: Secondary | ICD-10-CM | POA: Diagnosis not present

## 2015-04-12 DIAGNOSIS — J449 Chronic obstructive pulmonary disease, unspecified: Secondary | ICD-10-CM | POA: Diagnosis not present

## 2015-04-12 LAB — BASIC METABOLIC PANEL
ANION GAP: 10 (ref 5–15)
BUN: 43 mg/dL — ABNORMAL HIGH (ref 6–20)
CALCIUM: 9.5 mg/dL (ref 8.9–10.3)
CO2: 29 mmol/L (ref 22–32)
Chloride: 94 mmol/L — ABNORMAL LOW (ref 101–111)
Creatinine, Ser: 2.01 mg/dL — ABNORMAL HIGH (ref 0.44–1.00)
GFR, EST AFRICAN AMERICAN: 33 mL/min — AB (ref >60)
GFR, EST NON AFRICAN AMERICAN: 28 mL/min — AB (ref >60)
Glucose, Bld: 99 mg/dL (ref 65–99)
POTASSIUM: 4.4 mmol/L (ref 3.5–5.1)
Sodium: 133 mmol/L — ABNORMAL LOW (ref 135–145)

## 2015-04-12 LAB — CBC
HEMATOCRIT: 26.8 % — AB (ref 36.0–46.0)
Hemoglobin: 8.9 g/dL — ABNORMAL LOW (ref 12.0–15.0)
MCH: 32.7 pg (ref 26.0–34.0)
MCHC: 33.2 g/dL (ref 30.0–36.0)
MCV: 98.5 fL (ref 78.0–100.0)
PLATELETS: 348 10*3/uL (ref 150–400)
RBC: 2.72 MIL/uL — AB (ref 3.87–5.11)
RDW: 16.9 % — AB (ref 11.5–15.5)
WBC: 8.4 10*3/uL (ref 4.0–10.5)

## 2015-04-12 LAB — HCG, SERUM, QUALITATIVE: Preg, Serum: NEGATIVE

## 2015-04-12 NOTE — Pre-Procedure Instructions (Signed)
Patient given information to sign up for my chart at home. 

## 2015-04-12 NOTE — Pre-Procedure Instructions (Signed)
Patient's recent, extensive medical record reviewed with Dr Patsey Berthold. No orders given.

## 2015-04-14 NOTE — Pre-Procedure Instructions (Signed)
H&H shown to Dr Milford Cage. Patient is having TCS due to anemia. No orders given.  Result called to endo, spoke with L.Sharon Seller. She will give Dr Gala Romney this message.

## 2015-04-14 NOTE — Pre-Procedure Instructions (Signed)
Dr Gala Romney wants patient to consider and upper endoscopy as well tomorrow. He does not want patient sedated until he talks to her in the morning to verify this. Obtain consent order updated in computer and on consent. Note put on patient's chart to not sedated her until Dr Gala Romney sees her in morning.

## 2015-04-15 ENCOUNTER — Ambulatory Visit (HOSPITAL_COMMUNITY)
Admission: RE | Admit: 2015-04-15 | Discharge: 2015-04-15 | Disposition: A | Discharge status: Home / Self Care | Payer: 59 | Source: Ambulatory Visit | Attending: Internal Medicine | Admitting: Internal Medicine

## 2015-04-15 ENCOUNTER — Other Ambulatory Visit (HOSPITAL_COMMUNITY): Payer: Self-pay | Admitting: Oncology

## 2015-04-15 ENCOUNTER — Encounter (HOSPITAL_COMMUNITY)
Admission: RE | Disposition: A | Discharge status: Home / Self Care | Payer: Self-pay | Source: Ambulatory Visit | Attending: Internal Medicine

## 2015-04-15 ENCOUNTER — Encounter (HOSPITAL_COMMUNITY): Admission: RE | Payer: Self-pay | Source: Ambulatory Visit

## 2015-04-15 ENCOUNTER — Ambulatory Visit (HOSPITAL_COMMUNITY): Payer: 59 | Admitting: Anesthesiology

## 2015-04-15 ENCOUNTER — Telehealth (HOSPITAL_COMMUNITY): Payer: Self-pay | Admitting: *Deleted

## 2015-04-15 ENCOUNTER — Encounter (HOSPITAL_COMMUNITY): Payer: Self-pay | Admitting: *Deleted

## 2015-04-15 ENCOUNTER — Ambulatory Visit (HOSPITAL_COMMUNITY): Admission: RE | Admit: 2015-04-15 | Payer: 59 | Source: Ambulatory Visit | Admitting: Internal Medicine

## 2015-04-15 DIAGNOSIS — M1990 Unspecified osteoarthritis, unspecified site: Secondary | ICD-10-CM | POA: Insufficient documentation

## 2015-04-15 DIAGNOSIS — Z880 Allergy status to penicillin: Secondary | ICD-10-CM | POA: Insufficient documentation

## 2015-04-15 DIAGNOSIS — F329 Major depressive disorder, single episode, unspecified: Secondary | ICD-10-CM | POA: Insufficient documentation

## 2015-04-15 DIAGNOSIS — D122 Benign neoplasm of ascending colon: Secondary | ICD-10-CM | POA: Diagnosis not present

## 2015-04-15 DIAGNOSIS — Z91040 Latex allergy status: Secondary | ICD-10-CM | POA: Insufficient documentation

## 2015-04-15 DIAGNOSIS — R635 Abnormal weight gain: Secondary | ICD-10-CM

## 2015-04-15 DIAGNOSIS — E7800 Pure hypercholesterolemia, unspecified: Secondary | ICD-10-CM | POA: Insufficient documentation

## 2015-04-15 DIAGNOSIS — D649 Anemia, unspecified: Secondary | ICD-10-CM | POA: Diagnosis not present

## 2015-04-15 DIAGNOSIS — F32A Depression, unspecified: Secondary | ICD-10-CM

## 2015-04-15 DIAGNOSIS — Z85810 Personal history of malignant neoplasm of tongue: Secondary | ICD-10-CM | POA: Insufficient documentation

## 2015-04-15 DIAGNOSIS — C140 Malignant neoplasm of pharynx, unspecified: Secondary | ICD-10-CM

## 2015-04-15 DIAGNOSIS — G629 Polyneuropathy, unspecified: Secondary | ICD-10-CM | POA: Insufficient documentation

## 2015-04-15 DIAGNOSIS — Z91030 Bee allergy status: Secondary | ICD-10-CM | POA: Insufficient documentation

## 2015-04-15 DIAGNOSIS — C01 Malignant neoplasm of base of tongue: Secondary | ICD-10-CM

## 2015-04-15 DIAGNOSIS — M797 Fibromyalgia: Secondary | ICD-10-CM | POA: Insufficient documentation

## 2015-04-15 DIAGNOSIS — Z860100 Personal history of colon polyps, unspecified: Secondary | ICD-10-CM | POA: Insufficient documentation

## 2015-04-15 DIAGNOSIS — J449 Chronic obstructive pulmonary disease, unspecified: Secondary | ICD-10-CM | POA: Insufficient documentation

## 2015-04-15 DIAGNOSIS — Z87891 Personal history of nicotine dependence: Secondary | ICD-10-CM | POA: Insufficient documentation

## 2015-04-15 DIAGNOSIS — Z79899 Other long term (current) drug therapy: Secondary | ICD-10-CM | POA: Insufficient documentation

## 2015-04-15 DIAGNOSIS — F410 Panic disorder [episodic paroxysmal anxiety] without agoraphobia: Secondary | ICD-10-CM | POA: Insufficient documentation

## 2015-04-15 HISTORY — PX: COLONOSCOPY WITH PROPOFOL: SHX5780

## 2015-04-15 HISTORY — PX: POLYPECTOMY: SHX5525

## 2015-04-15 SURGERY — COLONOSCOPY
Anesthesia: Monitor Anesthesia Care

## 2015-04-15 SURGERY — COLONOSCOPY WITH PROPOFOL
Anesthesia: Monitor Anesthesia Care

## 2015-04-15 MED ORDER — PROPOFOL 10 MG/ML IV BOLUS
INTRAVENOUS | Status: DC | PRN
  Administered 2015-04-15 (×2): 10 mg via INTRAVENOUS

## 2015-04-15 MED ORDER — FENTANYL 75 MCG/HR TD PT72: 75.0000 ug | MEDICATED_PATCH | TRANSDERMAL | Status: DC

## 2015-04-15 MED ORDER — MIDAZOLAM HCL 2 MG/2ML IJ SOLN
1.0000 mg | INTRAMUSCULAR | Status: DC | PRN
  Administered 2015-04-15: 2 mg via INTRAVENOUS
  Filled 2015-04-15: qty 2

## 2015-04-15 MED ORDER — FENTANYL CITRATE (PF) 100 MCG/2ML IJ SOLN
25.0000 ug | INTRAMUSCULAR | Status: AC
  Administered 2015-04-15 (×2): 25 ug via INTRAVENOUS
  Filled 2015-04-15: qty 2

## 2015-04-15 MED ORDER — QUETIAPINE FUMARATE 50 MG PO TABS: 50.0000 mg | ORAL_TABLET | Freq: Every day | ORAL | Status: DC | PRN

## 2015-04-15 MED ORDER — POTASSIUM BICARB-CITRIC ACID 20 MEQ PO TBEF: EFFERVESCENT_TABLET | ORAL | Status: DC

## 2015-04-15 MED ORDER — LACTATED RINGERS IV SOLN
INTRAVENOUS | Status: DC
  Administered 2015-04-15: 1000 mL via INTRAVENOUS

## 2015-04-15 MED ORDER — ONDANSETRON HCL 4 MG/2ML IJ SOLN
4.0000 mg | Freq: Once | INTRAMUSCULAR | Status: DC | PRN
Start: 2015-04-15 — End: 2015-04-15

## 2015-04-15 MED ORDER — STERILE WATER FOR IRRIGATION IR SOLN
Status: DC | PRN
  Administered 2015-04-15: 10:00:00

## 2015-04-15 MED ORDER — WATER FOR IRRIGATION, STERILE IR SOLN
Status: DC | PRN
  Administered 2015-04-15: 1000 mL

## 2015-04-15 MED ORDER — PROPOFOL 500 MG/50ML IV EMUL
INTRAVENOUS | Status: DC | PRN
  Administered 2015-04-15: 120 ug/kg/min via INTRAVENOUS

## 2015-04-15 MED ORDER — SUCRALFATE 1 G PO TABS: ORAL_TABLET | ORAL | Status: DC

## 2015-04-15 MED ORDER — FENTANYL CITRATE (PF) 100 MCG/2ML IJ SOLN: 25.0000 ug | INTRAMUSCULAR | Status: DC | PRN

## 2015-04-15 MED ORDER — DULOXETINE HCL 30 MG PO CPEP: ORAL_CAPSULE | ORAL | Status: DC

## 2015-04-15 MED ORDER — PANTOPRAZOLE SODIUM 40 MG PO TBEC: 40.0000 mg | DELAYED_RELEASE_TABLET | Freq: Every day | ORAL | Status: DC

## 2015-04-15 SURGICAL SUPPLY — 24 items
BLOCK BITE 60FR ADLT L/F BLUE (MISCELLANEOUS) IMPLANT
DEVICE CLIP HEMOSTAT 235CM (CLIP) IMPLANT
ELECT REM PT RETURN 9FT ADLT (ELECTROSURGICAL)
ELECTRODE REM PT RTRN 9FT ADLT (ELECTROSURGICAL) IMPLANT
FCP BXJMBJMB 240X2.8X (CUTTING FORCEPS)
FLOOR PAD 36X40 (MISCELLANEOUS)
FORCEPS BIOP RAD 4 LRG CAP 4 (CUTTING FORCEPS) ×3 IMPLANT
FORCEPS BIOP RJ4 240 W/NDL (CUTTING FORCEPS)
FORCEPS BXJMBJMB 240X2.8X (CUTTING FORCEPS) IMPLANT
FORMALIN 10 PREFIL 20ML (MISCELLANEOUS) ×3 IMPLANT
INJECTOR/SNARE I SNARE (MISCELLANEOUS) IMPLANT
KIT ENDO PROCEDURE PEN (KITS) ×3 IMPLANT
MANIFOLD NEPTUNE II (INSTRUMENTS) ×3 IMPLANT
NEEDLE SCLEROTHERAPY 25GX240 (NEEDLE) IMPLANT
PAD FLOOR 36X40 (MISCELLANEOUS) IMPLANT
PROBE APC STR FIRE (PROBE) IMPLANT
PROBE INJECTION GOLD (MISCELLANEOUS)
PROBE INJECTION GOLD 7FR (MISCELLANEOUS) IMPLANT
SNARE ROTATE MED OVAL 20MM (MISCELLANEOUS) IMPLANT
SNARE SHORT THROW 13M SML OVAL (MISCELLANEOUS) IMPLANT
SYR INFLATION 60ML (SYRINGE) IMPLANT
TRAP SPECIMEN MUCOUS 40CC (MISCELLANEOUS) ×3 IMPLANT
TUBING IRRIGATION ENDOGATOR (MISCELLANEOUS) IMPLANT
WATER STERILE IRR 1000ML POUR (IV SOLUTION) ×3 IMPLANT

## 2015-04-15 NOTE — Discharge Instructions (Signed)
°Colonoscopy °Discharge Instructions ° °Read the instructions outlined below and refer to this sheet in the next few weeks. These discharge instructions provide you with general information on caring for yourself after you leave the hospital. Your doctor may also give you specific instructions. While your treatment has been planned according to the most current medical practices available, unavoidable complications occasionally occur. If you have any problems or questions after discharge, call Dr. Rourk at [phone]. °ACTIVITY °· You may resume your regular activity, but move at a slower pace for the next 24 hours.  °· Take frequent rest periods for the next 24 hours.  °· Walking will help get rid of the air and reduce the bloated feeling in your belly (abdomen).  °· No driving for 24 hours (because of the medicine (anesthesia) used during the test).   °· Do not sign any important legal documents or operate any machinery for 24 hours (because of the anesthesia used during the test).  °NUTRITION °· Drink plenty of fluids.  °· You may resume your normal diet as instructed by your doctor.  °· Begin with a light meal and progress to your normal diet. Heavy or fried foods are harder to digest and may make you feel sick to your stomach (nauseated).  °· Avoid alcoholic beverages for 24 hours or as instructed.  °MEDICATIONS °· You may resume your normal medications unless your doctor tells you otherwise.  °WHAT YOU CAN EXPECT TODAY °· Some feelings of bloating in the abdomen.  °· Passage of more gas than usual.  °· Spotting of blood in your stool or on the toilet paper.  °IF YOU HAD POLYPS REMOVED DURING THE COLONOSCOPY: °· No aspirin products for 7 days or as instructed.  °· No alcohol for 7 days or as instructed.  °· Eat a soft diet for the next 24 hours.  °FINDING OUT THE RESULTS OF YOUR TEST °Not all test results are available during your visit. If your test results are not back during the visit, make an appointment  with your caregiver to find out the results. Do not assume everything is normal if you have not heard from your caregiver or the medical facility. It is important for you to follow up on all of your test results.  °SEEK IMMEDIATE MEDICAL ATTENTION IF: °· You have more than a spotting of blood in your stool.  °· Your belly is swollen (abdominal distention).  °· You are nauseated or vomiting.  °· You have a temperature over 101.  °· You have abdominal pain or discomfort that is severe or gets worse throughout the day.  ° ° °Colon polyp information provided °Further recommendations to follow pending review of pathology report ° ° °Colon Polyps °Polyps are lumps of extra tissue growing inside the body. Polyps can grow in the large intestine (colon). Most colon polyps are noncancerous (benign). However, some colon polyps can become cancerous over time. Polyps that are larger than a pea may be harmful. To be safe, caregivers remove and test all polyps. °CAUSES  °Polyps form when mutations in the genes cause your cells to grow and divide even though no more tissue is needed. °RISK FACTORS °There are a number of risk factors that can increase your chances of getting colon polyps. They include: °· Being older than 50 years. °· Family history of colon polyps or colon cancer. °· Long-term colon diseases, such as colitis or Crohn disease. °· Being overweight. °· Smoking. °· Being inactive. °· Drinking too much alcohol. °  SYMPTOMS  °Most small polyps do not cause symptoms. If symptoms are present, they may include: °· Blood in the stool. The stool may look dark red or black. °· Constipation or diarrhea that lasts longer than 1 week. °DIAGNOSIS °People often do not know they have polyps until their caregiver finds them during a regular checkup. Your caregiver can use 4 tests to check for polyps: °· Digital rectal exam. The caregiver wears gloves and feels inside the rectum. This test would find polyps only in the rectum. °· Barium  enema. The caregiver puts a liquid called barium into your rectum before taking X-rays of your colon. Barium makes your colon look white. Polyps are dark, so they are easy to see in the X-ray pictures. °· Sigmoidoscopy. A thin, flexible tube (sigmoidoscope) is placed into your rectum. The sigmoidoscope has a light and tiny camera in it. The caregiver uses the sigmoidoscope to look at the last third of your colon. °· Colonoscopy. This test is like sigmoidoscopy, but the caregiver looks at the entire colon. This is the most common method for finding and removing polyps. °TREATMENT  °Any polyps will be removed during a sigmoidoscopy or colonoscopy. The polyps are then tested for cancer. °PREVENTION  °To help lower your risk of getting more colon polyps: °· Eat plenty of fruits and vegetables. Avoid eating fatty foods. °· Do not smoke. °· Avoid drinking alcohol. °· Exercise every day. °· Lose weight if recommended by your caregiver. °· Eat plenty of calcium and folate. Foods that are rich in calcium include milk, cheese, and broccoli. Foods that are rich in folate include chickpeas, kidney beans, and spinach. °HOME CARE INSTRUCTIONS °Keep all follow-up appointments as directed by your caregiver. You may need periodic exams to check for polyps. °SEEK MEDICAL CARE IF: °You notice bleeding during a bowel movement. °  °This information is not intended to replace advice given to you by your health care provider. Make sure you discuss any questions you have with your health care provider. °  °Document Released: 03/08/2004 Document Revised: 07/03/2014 Document Reviewed: 08/22/2011 °Elsevier Interactive Patient Education ©2016 Elsevier Inc. ° °

## 2015-04-15 NOTE — Transfer of Care (Signed)
Immediate Anesthesia Transfer of Care Note  Patient: Shelby Larson  Procedure(s) Performed: Procedure(s) with comments: COLONOSCOPY WITH PROPOFOL (N/A) -  in cecum at 1036,  80min withdrawal time POLYPECTOMY (N/A)  Patient Location: PACU  Anesthesia Type:MAC  Level of Consciousness: awake  Airway & Oxygen Therapy: Patient Spontanous Breathing and Patient connected to face mask oxygen  Post-op Assessment: Report given to RN  Post vital signs: Reviewed and stable  Last Vitals:  Filed Vitals:   04/15/15 1010  BP: 130/91  Pulse:   Temp:   Resp: 13    Complications: No apparent anesthesia complications

## 2015-04-15 NOTE — Interval H&P Note (Signed)
History and Physical Interval Note:  04/15/2015 10:05 AM  Shelby Larson  has presented today for surgery, with the diagnosis of ANEMIA  The various methods of treatment have been discussed with the patient and family. After consideration of risks, benefits and other options for treatment, the patient has consented to  Procedure(s) with comments: COLONOSCOPY WITH PROPOFOL (N/A) - 0945 as a surgical intervention .  The patient's history has been reviewed, patient examined, no change in status, stable for surgery.  I have reviewed the patient's chart and labs.  Questions were answered to the patient's satisfaction.     Robert Rourk  No change. No indication for EGD at this time. Colonoscopy now being done for screening/anemia.The risks, benefits, limitations, alternatives and imponderables have been reviewed with the patient. Questions have been answered. All parties are agreeable.

## 2015-04-15 NOTE — Anesthesia Preprocedure Evaluation (Signed)
Anesthesia Evaluation  Patient identified by MRN, date of birth, ID band Patient awake    Reviewed: Allergy & Precautions, NPO status , Patient's Chart, lab work & pertinent test results  Airway Mallampati: III  TM Distance: >3 FB     Dental  (+) Teeth Intact   Pulmonary asthma , COPD, former smoker,  Base tongue CA, RND + trach  2016   breath sounds clear to auscultation       Cardiovascular negative cardio ROS   Rhythm:Regular Rate:Normal     Neuro/Psych  Headaches, Seizures -,  PSYCHIATRIC DISORDERS Anxiety Depression    GI/Hepatic   Endo/Other    Renal/GU      Musculoskeletal  (+) Arthritis , Fibromyalgia -  Abdominal   Peds  Hematology  (+) anemia ,   Anesthesia Other Findings   Reproductive/Obstetrics                             Anesthesia Physical Anesthesia Plan  ASA: III  Anesthesia Plan: MAC   Post-op Pain Management:    Induction: Intravenous  Airway Management Planned: Simple Face Mask  Additional Equipment:   Intra-op Plan:   Post-operative Plan:   Informed Consent: I have reviewed the patients History and Physical, chart, labs and discussed the procedure including the risks, benefits and alternatives for the proposed anesthesia with the patient or authorized representative who has indicated his/her understanding and acceptance.     Plan Discussed with:   Anesthesia Plan Comments:         Anesthesia Quick Evaluation

## 2015-04-15 NOTE — Interval H&P Note (Signed)
History and Physical Interval Note:  04/15/2015 9:24 AM  [name]  has presented today for surgery, with the diagnosis of ANEMIA  The various methods of treatment have been discussed with the patient and family. After consideration of risks, benefits and other options for treatment, the patient has consented to  Procedure(s) with comments: COLONOSCOPY WITH PROPOFOL (N/A) - 0945 ESOPHAGOGASTRODUODENOSCOPY (EGD) WITH PROPOFOL (N/A) as a surgical intervention .  The patient's history has been reviewed, patient examined, no change in status, stable for surgery.  I have reviewed the patient's chart and labs.  Questions were answered to the patient's satisfaction.     Christle Nolting  No new or alarming UGI tract symptoms; therefore, TCS only today for screening anemia as discussed with pt and family members.  The risks, benefits, limitations, alternatives and imponderables have been reviewed with the patient. Questions have been answered. All parties are agreeable.

## 2015-04-15 NOTE — Anesthesia Postprocedure Evaluation (Signed)
  Anesthesia Post-op Note  Patient: Shelby Larson  Procedure(s) Performed: Procedure(s) with comments: COLONOSCOPY WITH PROPOFOL (N/A) -  in cecum at 1036,  14min withdrawal time POLYPECTOMY (N/A)  Patient Location: PACU  Anesthesia Type:MAC  Level of Consciousness: awake, alert  and oriented  Airway and Oxygen Therapy: Patient Spontanous Breathing  Post-op Pain: none  Post-op Assessment: Post-op Vital signs reviewed, Patient's Cardiovascular Status Stable, Respiratory Function Stable, Patent Airway and No signs of Nausea or vomiting              Post-op Vital Signs: Reviewed and stable  Last Vitals:  Filed Vitals:   04/15/15 1115  BP:   Pulse: 92  Temp:   Resp: 13    Complications: No apparent anesthesia complications

## 2015-04-15 NOTE — H&P (View-Only) (Signed)
Primary Care Physician:  [name], MD Primary Gastroenterologist:  Dr.   Laurel Dimmer Complaint  Patient presents with  . Anemia    HPI:   lll  Today she denies hematochezia, melena. 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 Has had some recent weight loss with malfunctioning PEG tube, which has been changed out 2 weeks ago. She is using it now. Eats minimal po foods due to pain/burning sensation with eating due to chemotherapy. Has had improved bowel movements with Bristol scale 4 since doing vegetable and berry smoothies. Occasional diarrhea. Denies fever, chills. Admits occasional nocturnal dyspnea. Sleeps on 1 pillow. Denies chest pain, dizziness, lightheadedness, syncope, near syncope. Denies any other upper or lower GI symptoms.   Past Medical History  Diagnosis Date  . Heart murmur     as a child  . Chronic bronchitis   . Anxiety     panic attacks  . Depression   . Headache     onset a few months ago  . Neuropathy     had it in both hands  . Arthritis   . Fibromyalgia   . 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 Anemia   . Rosacea   . Hypercholesterolemia   . Asthma   . COPD (chronic obstructive pulmonary disease)   . Seizures     takes Gabapentin  (last one in 2010)    Past Surgical History  Procedure Laterality Date  . Wisdom tooth extraction    . Tonsillectomy Left 08/20/2014    Procedure: TONSILLECTOMY;  Surgeon: Ruby Cola, MD;  Location: [facility];  Service: ENT;  Laterality: Left;  . Radical neck dissection Left 08/20/2014    Procedure: RADICAL LEFT NECK DISSECTION ;  Surgeon: Ruby Cola, MD;  Location: [place];  Service: ENT;  Laterality: Left;  . Tracheostomy tube placement N/A 08/20/2014    Procedure: TRACHEOSTOMY - AWAKE;  Surgeon: Ruby Cola, MD;  Location: [place];  Service: ENT;  Laterality: N/A;  .  Gastrostomy tube placement    . Multiple extractions with alveoloplasty N/A 09/17/2014    Procedure: Extraction of tooth #'s [id number] with alveoloplasty, mandibular left lingual torus reduction, and gross debridement of remaining teeth.;  Surgeon: Lenn Cal, DDS;  Location: [place];  Service: Oral Surgery;  Laterality: N/A;    Current Outpatient Prescriptions  Medication Sig Dispense Refill  . cyclobenzaprine (FLEXERIL) 10 MG tablet Take 1 tablet (10 mg total) by mouth 3 (three) times daily as needed for muscle spasms. 30 tablet 1  . escitalopram (LEXAPRO) 20 MG tablet Take 1/2 tablet by mouth for 5 days then increase to one tablet thereafter 30 tablet 3  . fentaNYL (DURAGESIC - DOSED MCG/HR) 75 MCG/HR Place 1 patch (75 mcg total) onto the skin every 3 (three) days. 10 patch 0  . fluticasone (FLONASE) 50 MCG/ACT nasal spray Place 2 sprays into both nostrils daily. 16 g 2  . gabapentin (NEURONTIN) 600 MG tablet Take 600 mg by mouth 3 (three) times daily.    Marland Kitchen HYDROcodone-acetaminophen (NORCO) 10-325 MG per tablet Take 1 tablet by mouth every 6 (six) hours as needed. 60 tablet 0  . lidocaine-prilocaine (EMLA) cream Apply a quarter size amount to port site 1 hour prior to chemo. Do not rub in. Cover with plastic wrap. 30 g 2  . LORazepam (ATIVAN) 1  MG tablet Take 2 tablets every 4 hours as needed for anxiety or nausea. 60 tablet 1  . naloxegol oxalate (MOVANTIK) 25 MG TABS tablet Take 1 tablet (25 mg total) by mouth every morning. 30 tablet 4  . Nutritional Supplements (FEEDING SUPPLEMENT, JEVITY 1.5 CAL/FIBER,) LIQD 5 cans. Nocturnal feed. Rate100 ml/hr x 12 hours. Provides. 1778 kcals, 76 g pro, 900 mls fluid.  Flush 180 mls 5x a day.    . prochlorperazine (COMPAZINE) 10 MG tablet Take 1 tablet (10 mg total) by mouth every 6 (six) hours as needed for nausea or vomiting. 60 tablet 2  . sodium fluoride (PREVIDENT 5000 PLUS) 1.1 % CREA dental cream Apply to tooth brush. Brush teeth for 2  minutes. Spit out excess-DO NOT swallow. Repeat nightly. 1 Tube prn  . sucralfate (CARAFATE) 1 G tablet Dissolve 1 tablet in 10 mL H20 and swallow 30 min prior to meals and bedtime. 60 tablet 5  . tetrahydrozoline-zinc (VISINE-AC) 0.05-0.25 % ophthalmic solution Place 2 drops into both eyes daily as needed (dry eyes).    . valACYclovir (VALTREX) 500 MG tablet Take 1 tablet (500 mg total) by mouth 2 (two) times daily. 60 tablet 3  . pantoprazole (PROTONIX) 40 MG tablet Take 1 tablet (40 mg total) by mouth daily. (Patient not taking: Reported on 12/21/2014) 30 tablet 2  . Potassium Bicarb-Citric Acid 20 MEQ TBEF Use via peg tube twice daily (Patient not taking: Reported on 12/21/2014) 120 each 3  . QUEtiapine (SEROQUEL) 50 MG tablet Take 1 tablet (50 mg total) by mouth daily as needed (sleep). (Patient not taking: Reported on 02/05/2015) 30 tablet 1   No current facility-administered medications for this visit.    Allergies as of 03/23/2015 - Review Complete 03/23/2015  Allergen Reaction Noted  . Bee venom Swelling 08/20/2014  . Penicillins Other (See Comments) 08/08/2014  . Latex Swelling and Rash 08/08/2014    Family History  Problem Relation Age of Onset  . Neuropathy Mother   . Hypertension Mother   . Alcoholism Father   . Alcoholism Sister     Social History   Social History  . Marital Status: Single    Spouse Name: N/A  . Number of Children: N/A  . Years of Education: N/A   Occupational History  . Not on file.   Social History Main Topics  . Smoking status: Former Smoker -- 0.50 packs/day for 20 years    Types: Cigarettes    Quit date: 06/18/2014  . Smokeless tobacco: Never Used  . Alcohol Use: Yes     Comment: occasional, 09/02/14 no longer using  . Drug Use: No  . Sexual Activity: Not on file   Other Topics Concern  . Not on file   Social History Narrative    Review of Systems: General: Negative for anorexia, fever, chills. Eyes: Negative for vision changes.    ENT: Negative for hoarseness, difficulty swallowing. Admits pain with eating primarily on her tongue due to chemo CV: Negative for chest pain, angina, palpitations, peripheral edema.  Respiratory: Negative for dyspnea at rest, cough, sputum, wheezing.  GI: See history of present illness. Derm: Negative for rash or itching.  Endo: Negative for unusual weight change.  Heme: Negative for bruising or bleeding. Allergy: Negative for rash or hives.    Physical Exam: BP 100/71 mmHg  Pulse 82  Temp(Src) 96.9 F (36.1 C)  Ht 5\' 6"  (1.676 m)  Wt 104 lb 6.4 oz (47.356 kg)  BMI 16.86 kg/m2  LMP  02/15/2015 General:   Alert and oriented. Pleasant and cooperative. Well-nourished and well-developed.  Head:  Normocephalic and atraumatic. Eyes:  Without icterus, sclera clear and conjunctiva pink.  Ears:  Normal auditory acuity. Cardiovascular:  S1, S2 present without murmurs appreciated. Normal pulses noted. Extremities without clubbing or edema. Respiratory:  Clear to auscultation bilaterally. Trach in place. No wheezes, rales, or rhonchi. No distress.  Gastrointestinal:  +BS, soft, non-tender and non-distended. No HSM noted. No guarding or rebound. No masses appreciated. PEG tube noted to abdominal wall, no drainage, erythema, or edema noted. Rectal:  Deferred  Neurologic:  Alert and oriented x4;  grossly normal neurologically. Psych:  Alert and cooperative. Normal mood and affect.   03/23/2015 1:52 PM

## 2015-04-15 NOTE — Progress Notes (Signed)
Call to [facility]. Patient voices she needs a refill of her Fentanyl Patches.  Spoke with Recruitment consultant for [employer]. She will contact MD and nurse of patient request. Number [phone] given to patient and patient's mother to check on status of refill request.

## 2015-04-16 ENCOUNTER — Encounter (HOSPITAL_COMMUNITY): Payer: Self-pay | Admitting: Internal Medicine

## 2015-04-16 NOTE — Op Note (Signed)
[place], [id number]   COLONOSCOPY PROCEDURE REPORT  PATIENT: Shelby, Larson  MR#: [id number] BIRTHDATE: [birth year] , 48  yrs. old GENDER: female ENDOSCOPIST: R.  Garfield Cornea, MD FACP [facility] REFERRED BF:XOVANV Pleas Koch, M.D.  Robynn Pane, P.A. PROCEDURE DATE:  05-09-15 PROCEDURE:   Colonoscopy with snare polypectomy INDICATIONS:screening examination; anemia. MEDICATIONS: Deep sedation per Dr.  [facility] ASA CLASS:       Class III  CONSENT: The risks, benefits, alternatives and imponderables including but not limited to bleeding, perforation as well as the possibility of a missed lesion have been reviewed.  The potential for biopsy, lesion removal, etc. have also been discussed. Questions have been answered.  All parties agreeable.  Please see the history and physical in the medical record for more information.  DESCRIPTION OF PROCEDURE:   After the risks benefits and alternatives of the procedure were thoroughly explained, informed consent was obtained.  The digital rectal exam revealed no abnormalities of the rectum.   The     endoscope was introduced through the anus and advanced to the cecum, which was identified by both the appendix and ileocecal valve. No adverse events experienced.   The quality of the prep was adequate  The instrument was then slowly withdrawn as the colon was fully examined. Estimated blood loss is zero unless otherwise noted in this procedure report.      COLON FINDINGS: Normal-appearing rectal mucosa.  The patient had (2) 5 mm polyps in the mid ascending segment; otherwise, remainder of the colonic mucosa appeared normal.  The above-mentioned polyps were cold snare removed.  Retroflexion was performed. .  Withdrawal time=8 minutes 0 seconds.  The scope was withdrawn and the procedure completed. COMPLICATIONS: There were no immediate complications. EBL 3 mL ENDOSCOPIC  IMPRESSION: Colonic polyps?"removed as described above  RECOMMENDATIONS: Follow-up on pathology.  eSigned:  R. Garfield Cornea, MD Rosalita Chessman [facility] 09-May-2015 10:49 AM   cc:  CPT CODES: ICD CODES:  The ICD and CPT codes recommended by this software are interpretations from the data that the clinical staff has captured with the software.  The verification of the translation of this report to the ICD and CPT codes and modifiers is the sole responsibility of the health care institution and practicing physician where this report was generated.  [place]. will not be held responsible for the validity of the ICD and CPT codes included on this report.  AMA assumes no liability for data contained or not contained herein. CPT is a Designer, television/film set of the [employer].

## 2015-04-18 ENCOUNTER — Encounter: Payer: Self-pay | Admitting: Internal Medicine

## 2015-04-20 ENCOUNTER — Encounter (HOSPITAL_COMMUNITY): Payer: 59

## 2015-04-22 ENCOUNTER — Encounter (HOSPITAL_COMMUNITY): Payer: 59

## 2015-04-22 ENCOUNTER — Other Ambulatory Visit (HOSPITAL_COMMUNITY): Payer: Self-pay | Admitting: Oncology

## 2015-04-22 ENCOUNTER — Other Ambulatory Visit (HOSPITAL_COMMUNITY): Payer: Self-pay

## 2015-04-22 ENCOUNTER — Ambulatory Visit (HOSPITAL_COMMUNITY): Payer: Self-pay

## 2015-04-22 ENCOUNTER — Encounter (HOSPITAL_BASED_OUTPATIENT_CLINIC_OR_DEPARTMENT_OTHER): Payer: 59

## 2015-04-22 VITALS — BP 120/80 | HR 82 | Temp 97.8°F | Resp 16

## 2015-04-22 DIAGNOSIS — N189 Chronic kidney disease, unspecified: Secondary | ICD-10-CM

## 2015-04-22 DIAGNOSIS — C140 Malignant neoplasm of pharynx, unspecified: Secondary | ICD-10-CM

## 2015-04-22 DIAGNOSIS — C01 Malignant neoplasm of base of tongue: Secondary | ICD-10-CM

## 2015-04-22 DIAGNOSIS — D631 Anemia in chronic kidney disease: Secondary | ICD-10-CM

## 2015-04-22 DIAGNOSIS — R635 Abnormal weight gain: Secondary | ICD-10-CM

## 2015-04-22 DIAGNOSIS — J302 Other seasonal allergic rhinitis: Secondary | ICD-10-CM

## 2015-04-22 LAB — BASIC METABOLIC PANEL
ANION GAP: 10 (ref 5–15)
BUN: 44 mg/dL — ABNORMAL HIGH (ref 6–20)
CALCIUM: 9.1 mg/dL (ref 8.9–10.3)
CO2: 29 mmol/L (ref 22–32)
CREATININE: 2.01 mg/dL — AB (ref 0.44–1.00)
Chloride: 93 mmol/L — ABNORMAL LOW (ref 101–111)
GFR calc Af Amer: 33 mL/min — ABNORMAL LOW (ref >60)
GFR, EST NON AFRICAN AMERICAN: 28 mL/min — AB (ref >60)
Glucose, Bld: 96 mg/dL (ref 65–99)
POTASSIUM: 4.4 mmol/L (ref 3.5–5.1)
Sodium: 132 mmol/L — ABNORMAL LOW (ref 135–145)

## 2015-04-22 LAB — CBC WITH DIFFERENTIAL/PLATELET
BASOS ABS: 0 10*3/uL (ref 0.0–0.1)
Basophils Relative: 0 %
Eosinophils Absolute: 0.2 10*3/uL (ref 0.0–0.7)
Eosinophils Relative: 1 %
HEMATOCRIT: 26.3 % — AB (ref 36.0–46.0)
Hemoglobin: 8.7 g/dL — ABNORMAL LOW (ref 12.0–15.0)
LYMPHS ABS: 0.6 10*3/uL — AB (ref 0.7–4.0)
LYMPHS PCT: 5 %
MCH: 33.3 pg (ref 26.0–34.0)
MCHC: 33.1 g/dL (ref 30.0–36.0)
MCV: 100.8 fL — AB (ref 78.0–100.0)
MONO ABS: 0.9 10*3/uL (ref 0.1–1.0)
Monocytes Relative: 8 %
NEUTROS ABS: 10.1 10*3/uL — AB (ref 1.7–7.7)
Neutrophils Relative %: 86 %
Platelets: 292 10*3/uL (ref 150–400)
RBC: 2.61 MIL/uL — AB (ref 3.87–5.11)
RDW: 16.8 % — AB (ref 11.5–15.5)
WBC: 11.7 10*3/uL — ABNORMAL HIGH (ref 4.0–10.5)

## 2015-04-22 MED ORDER — DARBEPOETIN ALFA 40 MCG/0.4ML IJ SOSY
PREFILLED_SYRINGE | INTRAMUSCULAR | Status: AC
  Filled 2015-04-22: qty 0.4

## 2015-04-22 MED ORDER — DARBEPOETIN ALFA 40 MCG/0.4ML IJ SOSY
40.0000 ug | PREFILLED_SYRINGE | Freq: Once | INTRAMUSCULAR | Status: AC
  Administered 2015-04-22: 40 ug via SUBCUTANEOUS

## 2015-04-22 MED ORDER — HEPARIN SOD (PORK) LOCK FLUSH 100 UNIT/ML IV SOLN
500.0000 [IU] | Freq: Once | INTRAVENOUS | Status: AC
  Administered 2015-04-22: 500 [IU] via INTRAVENOUS

## 2015-04-22 MED ORDER — FLUTICASONE PROPIONATE 50 MCG/ACT NA SUSP: 2.0000 | Freq: Every day | NASAL | Status: AC

## 2015-04-22 MED ORDER — HEPARIN SOD (PORK) LOCK FLUSH 100 UNIT/ML IV SOLN
INTRAVENOUS | Status: AC
  Filled 2015-04-22: qty 5

## 2015-04-22 MED ORDER — NYSTATIN 100000 UNIT/GM EX CREA: 1.0000 "application " | TOPICAL_CREAM | Freq: Two times a day (BID) | CUTANEOUS | Status: DC

## 2015-04-22 MED ORDER — PROCHLORPERAZINE MALEATE 10 MG PO TABS: 10.0000 mg | ORAL_TABLET | Freq: Four times a day (QID) | ORAL | Status: DC | PRN

## 2015-04-22 MED ORDER — SODIUM CHLORIDE 0.9 % IJ SOLN
10.0000 mL | INTRAMUSCULAR | Status: DC | PRN
  Administered 2015-04-22: 10 mL via INTRAVENOUS
  Filled 2015-04-22: qty 10

## 2015-04-22 MED ORDER — FENTANYL 75 MCG/HR TD PT72: 75.0000 ug | MEDICATED_PATCH | TRANSDERMAL | Status: DC

## 2015-04-22 NOTE — Progress Notes (Signed)
Shelby Larson presents today for injection per MD orders. Aranesp 63mcg administered SQ in right Abdomen. Administration without incident. Patient tolerated well.  Port flushed also with 48ml Normal Saline and 500u Heparin.  Blood return noted, specimen drawn for labs.   Needle withdrawn intact with no active bleeding. Patient tolerated well.

## 2015-04-22 NOTE — Patient Instructions (Signed)
[place] at [facility] Discharge Instructions  RECOMMENDATIONS MADE BY THE CONSULTANT AND ANY TEST RESULTS WILL BE SENT TO YOUR REFERRING PHYSICIAN.  Aranesp today.   Return in two weeks as scheduled.    Thank you for choosing [place] at [facility] to provide your oncology and hematology care.  To afford each patient quality time with our provider, please arrive at least 15 minutes before your scheduled appointment time.    You need to re-schedule your appointment should you arrive 10 or more minutes late.  We strive to give you quality time with our providers, and arriving late affects you and other patients whose appointments are after yours.  Also, if you no show three or more times for appointments you may be dismissed from the clinic at the providers discretion.     Again, thank you for choosing [facility].  Our hope is that these requests will decrease the amount of time that you wait before being seen by our physicians.       _____________________________________________________________  Should you have questions after your visit to [facility], please contact our office at [phone] between the hours of 8:30 a.m. and 4:30 p.m.  Voicemails left after 4:30 p.m. will not be returned until the following business day.  For prescription refill requests, have your pharmacy contact our office.

## 2015-05-06 ENCOUNTER — Other Ambulatory Visit (HOSPITAL_COMMUNITY): Payer: Self-pay

## 2015-05-06 ENCOUNTER — Encounter (HOSPITAL_COMMUNITY): Payer: 59 | Attending: Hematology & Oncology

## 2015-05-06 ENCOUNTER — Encounter (HOSPITAL_COMMUNITY): Payer: Self-pay

## 2015-05-06 ENCOUNTER — Ambulatory Visit (HOSPITAL_COMMUNITY): Payer: Self-pay

## 2015-05-06 ENCOUNTER — Encounter (HOSPITAL_BASED_OUTPATIENT_CLINIC_OR_DEPARTMENT_OTHER): Payer: 59

## 2015-05-06 VITALS — BP 111/71 | HR 87 | Temp 98.0°F | Resp 20

## 2015-05-06 DIAGNOSIS — Z87891 Personal history of nicotine dependence: Secondary | ICD-10-CM | POA: Diagnosis not present

## 2015-05-06 DIAGNOSIS — C01 Malignant neoplasm of base of tongue: Secondary | ICD-10-CM | POA: Diagnosis present

## 2015-05-06 DIAGNOSIS — D631 Anemia in chronic kidney disease: Secondary | ICD-10-CM | POA: Diagnosis not present

## 2015-05-06 DIAGNOSIS — N189 Chronic kidney disease, unspecified: Secondary | ICD-10-CM

## 2015-05-06 DIAGNOSIS — Z95828 Presence of other vascular implants and grafts: Secondary | ICD-10-CM

## 2015-05-06 LAB — CBC WITH DIFFERENTIAL/PLATELET
BASOS ABS: 0 10*3/uL (ref 0.0–0.1)
Basophils Relative: 0 %
Eosinophils Absolute: 0.3 10*3/uL (ref 0.0–0.7)
Eosinophils Relative: 2 %
HEMATOCRIT: 22 % — AB (ref 36.0–46.0)
Hemoglobin: 7.4 g/dL — ABNORMAL LOW (ref 12.0–15.0)
LYMPHS PCT: 3 %
Lymphs Abs: 0.4 10*3/uL — ABNORMAL LOW (ref 0.7–4.0)
MCH: 32.9 pg (ref 26.0–34.0)
MCHC: 33.6 g/dL (ref 30.0–36.0)
MCV: 97.8 fL (ref 78.0–100.0)
MONO ABS: 1.1 10*3/uL — AB (ref 0.1–1.0)
Monocytes Relative: 8 %
NEUTROS ABS: 10.8 10*3/uL — AB (ref 1.7–7.7)
Neutrophils Relative %: 87 %
Platelets: 384 10*3/uL (ref 150–400)
RBC: 2.25 MIL/uL — AB (ref 3.87–5.11)
RDW: 15.6 % — ABNORMAL HIGH (ref 11.5–15.5)
WBC: 12.6 10*3/uL — AB (ref 4.0–10.5)

## 2015-05-06 LAB — BASIC METABOLIC PANEL
ANION GAP: 11 (ref 5–15)
BUN: 40 mg/dL — ABNORMAL HIGH (ref 6–20)
CHLORIDE: 88 mmol/L — AB (ref 101–111)
CO2: 30 mmol/L (ref 22–32)
Calcium: 9 mg/dL (ref 8.9–10.3)
Creatinine, Ser: 1.78 mg/dL — ABNORMAL HIGH (ref 0.44–1.00)
GFR calc Af Amer: 38 mL/min — ABNORMAL LOW (ref >60)
GFR calc non Af Amer: 33 mL/min — ABNORMAL LOW (ref >60)
GLUCOSE: 91 mg/dL (ref 65–99)
POTASSIUM: 4.4 mmol/L (ref 3.5–5.1)
Sodium: 129 mmol/L — ABNORMAL LOW (ref 135–145)

## 2015-05-06 LAB — PREPARE RBC (CROSSMATCH)

## 2015-05-06 MED ORDER — SODIUM CHLORIDE 0.9 % IJ SOLN
10.0000 mL | INTRAMUSCULAR | Status: DC | PRN
  Administered 2015-05-06: 10 mL via INTRAVENOUS
  Filled 2015-05-06: qty 10

## 2015-05-06 MED ORDER — DARBEPOETIN ALFA 40 MCG/0.4ML IJ SOSY
PREFILLED_SYRINGE | INTRAMUSCULAR | Status: AC
Start: 2015-05-06 — End: 2015-05-06
  Filled 2015-05-06: qty 0.4

## 2015-05-06 MED ORDER — HEPARIN SOD (PORK) LOCK FLUSH 100 UNIT/ML IV SOLN
INTRAVENOUS | Status: AC
  Filled 2015-05-06: qty 5

## 2015-05-06 MED ORDER — DARBEPOETIN ALFA 40 MCG/0.4ML IJ SOSY
40.0000 ug | PREFILLED_SYRINGE | Freq: Once | INTRAMUSCULAR | Status: AC
  Administered 2015-05-06: 40 ug via SUBCUTANEOUS

## 2015-05-06 MED ORDER — HEPARIN SOD (PORK) LOCK FLUSH 100 UNIT/ML IV SOLN
500.0000 [IU] | Freq: Once | INTRAVENOUS | Status: AC
  Administered 2015-05-06: 500 [IU] via INTRAVENOUS

## 2015-05-06 NOTE — Progress Notes (Signed)
See injection encounter. 

## 2015-05-06 NOTE — Patient Instructions (Signed)
[place] at [facility] Discharge Instructions  RECOMMENDATIONS MADE BY THE CONSULTANT AND ANY TEST RESULTS WILL BE SENT TO YOUR REFERRING PHYSICIAN.  Port flushed with lab draw today per orders Aranesp given per orders Hgb 7.4 Blood set up for tomorrow.  Thank you for choosing [place] at [facility] to provide your oncology and hematology care.  To afford each patient quality time with our provider, please arrive at least 15 minutes before your scheduled appointment time.    You need to re-schedule your appointment should you arrive 10 or more minutes late.  We strive to give you quality time with our providers, and arriving late affects you and other patients whose appointments are after yours.  Also, if you no show three or more times for appointments you may be dismissed from the clinic at the providers discretion.     Again, thank you for choosing [facility].  Our hope is that these requests will decrease the amount of time that you wait before being seen by our physicians.       _____________________________________________________________  Should you have questions after your visit to [facility], please contact our office at [phone] between the hours of 8:30 a.m. and 4:30 p.m.  Voicemails left after 4:30 p.m. will not be returned until the following business day.  For prescription refill requests, have your pharmacy contact our office.

## 2015-05-06 NOTE — Progress Notes (Signed)
Shelby Larson presented for Portacath access and flush. Portacath located right chest wall accessed with  H 20 needle. Good blood return present. Portacath flushed with 45ml NS and 500U/27ml Heparin and needle removed intact. Procedure without incident. Patient tolerated procedure well.    Shelby Larson presents today for injection per MD orders. Aranesp 20mch administered SQ in right Abdomen. Administration without incident. Patient tolerated well.   Hemoglobin is 7.4 [name] Kefalas PA-C notified Set up for blood transfusion tomorrow. Orders put in per doctor.

## 2015-05-07 ENCOUNTER — Encounter (HOSPITAL_COMMUNITY): Payer: Self-pay

## 2015-05-07 ENCOUNTER — Encounter (HOSPITAL_BASED_OUTPATIENT_CLINIC_OR_DEPARTMENT_OTHER): Payer: 59

## 2015-05-07 VITALS — BP 110/72 | HR 74 | Temp 97.7°F | Resp 16 | Wt 116.0 lb

## 2015-05-07 DIAGNOSIS — D631 Anemia in chronic kidney disease: Secondary | ICD-10-CM

## 2015-05-07 DIAGNOSIS — N189 Chronic kidney disease, unspecified: Secondary | ICD-10-CM

## 2015-05-07 DIAGNOSIS — C01 Malignant neoplasm of base of tongue: Secondary | ICD-10-CM | POA: Diagnosis not present

## 2015-05-07 MED ORDER — DIPHENHYDRAMINE HCL 25 MG PO CAPS
ORAL_CAPSULE | ORAL | Status: AC
Start: 2015-05-07 — End: 2015-05-07
  Filled 2015-05-07: qty 1

## 2015-05-07 MED ORDER — SODIUM CHLORIDE 0.9 % IV SOLN
250.0000 mL | Freq: Once | INTRAVENOUS | Status: AC
  Administered 2015-05-07: 250 mL via INTRAVENOUS

## 2015-05-07 MED ORDER — SODIUM CHLORIDE 0.9 % IJ SOLN
10.0000 mL | INTRAMUSCULAR | Status: AC | PRN
  Administered 2015-05-07: 10 mL

## 2015-05-07 MED ORDER — ACETAMINOPHEN 325 MG PO TABS
650.0000 mg | ORAL_TABLET | Freq: Once | ORAL | Status: AC
  Administered 2015-05-07: 650 mg via ORAL

## 2015-05-07 MED ORDER — DIPHENHYDRAMINE HCL 25 MG PO CAPS
25.0000 mg | ORAL_CAPSULE | Freq: Once | ORAL | Status: AC
  Administered 2015-05-07: 25 mg via ORAL

## 2015-05-07 MED ORDER — ACETAMINOPHEN 325 MG PO TABS
ORAL_TABLET | ORAL | Status: AC
  Filled 2015-05-07: qty 2

## 2015-05-07 MED ORDER — HEPARIN SOD (PORK) LOCK FLUSH 100 UNIT/ML IV SOLN
500.0000 [IU] | Freq: Every day | INTRAVENOUS | Status: AC | PRN
  Administered 2015-05-07: 500 [IU]

## 2015-05-07 MED ORDER — HEPARIN SOD (PORK) LOCK FLUSH 100 UNIT/ML IV SOLN
INTRAVENOUS | Status: AC
  Filled 2015-05-07: qty 5

## 2015-05-07 NOTE — Progress Notes (Signed)
1400:  Tolerated transfusion w/o adverse reaction.  A&Ox4; VSS.  Discharged ambulatory.

## 2015-05-07 NOTE — Patient Instructions (Signed)
[place] at [facility] Discharge Instructions  RECOMMENDATIONS MADE BY THE CONSULTANT AND ANY TEST RESULTS WILL BE SENT TO YOUR REFERRING PHYSICIAN.  Today you received 2 units of blood. Return as scheduled port flushes and office visit.   Thank you for choosing [place] at [facility] to provide your oncology and hematology care.  To afford each patient quality time with our provider, please arrive at least 15 minutes before your scheduled appointment time.    You need to re-schedule your appointment should you arrive 10 or more minutes late.  We strive to give you quality time with our providers, and arriving late affects you and other patients whose appointments are after yours.  Also, if you no show three or more times for appointments you may be dismissed from the clinic at the providers discretion.     Again, thank you for choosing [facility].  Our hope is that these requests will decrease the amount of time that you wait before being seen by our physicians.       _____________________________________________________________  Should you have questions after your visit to [facility], please contact our office at [phone] between the hours of 8:30 a.m. and 4:30 p.m.  Voicemails left after 4:30 p.m. will not be returned until the following business day.  For prescription refill requests, have your pharmacy contact our office.

## 2015-05-08 LAB — TYPE AND SCREEN
ABO/RH(D): A POS
ANTIBODY SCREEN: NEGATIVE
UNIT DIVISION: 0
UNIT DIVISION: 0

## 2015-05-12 ENCOUNTER — Encounter (HOSPITAL_COMMUNITY): Payer: Self-pay

## 2015-05-14 ENCOUNTER — Other Ambulatory Visit (HOSPITAL_COMMUNITY): Payer: Self-pay

## 2015-05-19 ENCOUNTER — Encounter (HOSPITAL_BASED_OUTPATIENT_CLINIC_OR_DEPARTMENT_OTHER): Payer: 59

## 2015-05-19 ENCOUNTER — Encounter (HOSPITAL_COMMUNITY): Payer: 59

## 2015-05-19 ENCOUNTER — Encounter (HOSPITAL_COMMUNITY): Payer: Self-pay

## 2015-05-19 ENCOUNTER — Ambulatory Visit (HOSPITAL_COMMUNITY): Payer: Self-pay

## 2015-05-19 ENCOUNTER — Other Ambulatory Visit (HOSPITAL_COMMUNITY): Payer: Self-pay | Admitting: Oncology

## 2015-05-19 VITALS — BP 118/71 | HR 92 | Temp 98.8°F | Resp 20

## 2015-05-19 DIAGNOSIS — R635 Abnormal weight gain: Secondary | ICD-10-CM

## 2015-05-19 DIAGNOSIS — C140 Malignant neoplasm of pharynx, unspecified: Secondary | ICD-10-CM

## 2015-05-19 DIAGNOSIS — D631 Anemia in chronic kidney disease: Secondary | ICD-10-CM | POA: Diagnosis not present

## 2015-05-19 DIAGNOSIS — C01 Malignant neoplasm of base of tongue: Secondary | ICD-10-CM | POA: Diagnosis not present

## 2015-05-19 DIAGNOSIS — N189 Chronic kidney disease, unspecified: Secondary | ICD-10-CM | POA: Diagnosis not present

## 2015-05-19 LAB — CBC WITH DIFFERENTIAL/PLATELET
BASOS ABS: 0 10*3/uL (ref 0.0–0.1)
BASOS PCT: 0 %
EOS ABS: 0.2 10*3/uL (ref 0.0–0.7)
Eosinophils Relative: 1 %
HCT: 30.8 % — ABNORMAL LOW (ref 36.0–46.0)
Hemoglobin: 10.2 g/dL — ABNORMAL LOW (ref 12.0–15.0)
Lymphocytes Relative: 3 %
Lymphs Abs: 0.6 10*3/uL — ABNORMAL LOW (ref 0.7–4.0)
MCH: 31 pg (ref 26.0–34.0)
MCHC: 33.1 g/dL (ref 30.0–36.0)
MCV: 93.6 fL (ref 78.0–100.0)
MONO ABS: 1.6 10*3/uL — AB (ref 0.1–1.0)
MONOS PCT: 9 %
Neutro Abs: 15.7 10*3/uL — ABNORMAL HIGH (ref 1.7–7.7)
Neutrophils Relative %: 87 %
PLATELETS: 356 10*3/uL (ref 150–400)
RBC: 3.29 MIL/uL — ABNORMAL LOW (ref 3.87–5.11)
RDW: 17 % — AB (ref 11.5–15.5)
WBC: 18.1 10*3/uL — ABNORMAL HIGH (ref 4.0–10.5)

## 2015-05-19 LAB — BASIC METABOLIC PANEL
Anion gap: 10 (ref 5–15)
BUN: 40 mg/dL — AB (ref 6–20)
CHLORIDE: 90 mmol/L — AB (ref 101–111)
CO2: 29 mmol/L (ref 22–32)
Calcium: 8.9 mg/dL (ref 8.9–10.3)
Creatinine, Ser: 1.7 mg/dL — ABNORMAL HIGH (ref 0.44–1.00)
GFR calc Af Amer: 40 mL/min — ABNORMAL LOW (ref >60)
GFR calc non Af Amer: 34 mL/min — ABNORMAL LOW (ref >60)
GLUCOSE: 100 mg/dL — AB (ref 65–99)
POTASSIUM: 4.5 mmol/L (ref 3.5–5.1)
Sodium: 129 mmol/L — ABNORMAL LOW (ref 135–145)

## 2015-05-19 LAB — FERRITIN: Ferritin: 453 ng/mL — ABNORMAL HIGH (ref 11–307)

## 2015-05-19 MED ORDER — DARBEPOETIN ALFA 40 MCG/0.4ML IJ SOSY
40.0000 ug | PREFILLED_SYRINGE | Freq: Once | INTRAMUSCULAR | Status: AC
  Administered 2015-05-19: 40 ug via SUBCUTANEOUS
  Filled 2015-05-19: qty 0.4

## 2015-05-19 MED ORDER — HYDROCODONE-ACETAMINOPHEN 10-325 MG PO TABS: 1.0000 | ORAL_TABLET | Freq: Four times a day (QID) | ORAL | Status: DC | PRN

## 2015-05-19 MED ORDER — HEPARIN SOD (PORK) LOCK FLUSH 100 UNIT/ML IV SOLN
500.0000 [IU] | Freq: Once | INTRAVENOUS | Status: AC
  Administered 2015-05-19: 500 [IU] via INTRAVENOUS

## 2015-05-19 MED ORDER — SODIUM CHLORIDE 0.9 % IJ SOLN
10.0000 mL | INTRAMUSCULAR | Status: DC | PRN
  Administered 2015-05-19: 10 mL via INTRAVENOUS
  Filled 2015-05-19: qty 10

## 2015-05-19 MED ORDER — LORAZEPAM 2 MG PO TABS: 2.0000 mg | ORAL_TABLET | Freq: Four times a day (QID) | ORAL | Status: DC | PRN

## 2015-05-19 MED ORDER — HEPARIN SOD (PORK) LOCK FLUSH 100 UNIT/ML IV SOLN
INTRAVENOUS | Status: AC
  Filled 2015-05-19: qty 5

## 2015-05-19 NOTE — Progress Notes (Signed)
Shelby Larson presented for Portacath access and flush with Lab draw Portacath located right chest wall accessed with  H 20 needle. Good blood return present. Portacath flushed with 3ml NS and 500U/34ml Heparin and needle removed intact. Procedure without incident. Patient tolerated procedure well.

## 2015-05-19 NOTE — Progress Notes (Signed)
Please see other encounter for documentation.   

## 2015-05-19 NOTE — Patient Instructions (Signed)
[place] at [facility] Discharge Instructions  RECOMMENDATIONS MADE BY THE CONSULTANT AND ANY TEST RESULTS WILL BE SENT TO YOUR REFERRING PHYSICIAN.  Port flush and labs done today Procrit given per orders Follow up as scheduled  Thank you for choosing [place] at [facility] to provide your oncology and hematology care.  To afford each patient quality time with our provider, please arrive at least 15 minutes before your scheduled appointment time.    You need to re-schedule your appointment should you arrive 10 or more minutes late.  We strive to give you quality time with our providers, and arriving late affects you and other patients whose appointments are after yours.  Also, if you no show three or more times for appointments you may be dismissed from the clinic at the providers discretion.     Again, thank you for choosing [facility].  Our hope is that these requests will decrease the amount of time that you wait before being seen by our physicians.       _____________________________________________________________  Should you have questions after your visit to [facility], please contact our office at [phone] between the hours of 8:30 a.m. and 4:30 p.m.  Voicemails left after 4:30 p.m. will not be returned until the following business day.  For prescription refill requests, have your pharmacy contact our office.

## 2015-06-02 ENCOUNTER — Encounter (HOSPITAL_COMMUNITY): Payer: 59

## 2015-06-02 ENCOUNTER — Encounter (HOSPITAL_COMMUNITY): Payer: Self-pay | Admitting: Lab

## 2015-06-02 ENCOUNTER — Other Ambulatory Visit (HOSPITAL_COMMUNITY): Payer: Self-pay | Admitting: *Deleted

## 2015-06-02 ENCOUNTER — Ambulatory Visit (HOSPITAL_COMMUNITY): Payer: Self-pay

## 2015-06-02 ENCOUNTER — Encounter (HOSPITAL_COMMUNITY): Payer: 59 | Attending: Hematology & Oncology | Admitting: Hematology & Oncology

## 2015-06-02 VITALS — BP 93/69 | HR 107 | Temp 99.1°F | Resp 15 | Wt 117.1 lb

## 2015-06-02 DIAGNOSIS — D631 Anemia in chronic kidney disease: Secondary | ICD-10-CM

## 2015-06-02 DIAGNOSIS — D649 Anemia, unspecified: Secondary | ICD-10-CM | POA: Diagnosis not present

## 2015-06-02 DIAGNOSIS — K1230 Oral mucositis (ulcerative), unspecified: Secondary | ICD-10-CM

## 2015-06-02 DIAGNOSIS — C01 Malignant neoplasm of base of tongue: Secondary | ICD-10-CM

## 2015-06-02 DIAGNOSIS — R9389 Abnormal findings on diagnostic imaging of other specified body structures: Secondary | ICD-10-CM

## 2015-06-02 DIAGNOSIS — R635 Abnormal weight gain: Secondary | ICD-10-CM

## 2015-06-02 DIAGNOSIS — F329 Major depressive disorder, single episode, unspecified: Secondary | ICD-10-CM

## 2015-06-02 DIAGNOSIS — Z95828 Presence of other vascular implants and grafts: Secondary | ICD-10-CM

## 2015-06-02 DIAGNOSIS — N189 Chronic kidney disease, unspecified: Secondary | ICD-10-CM | POA: Diagnosis not present

## 2015-06-02 DIAGNOSIS — C140 Malignant neoplasm of pharynx, unspecified: Secondary | ICD-10-CM

## 2015-06-02 DIAGNOSIS — Z87891 Personal history of nicotine dependence: Secondary | ICD-10-CM | POA: Diagnosis not present

## 2015-06-02 LAB — BASIC METABOLIC PANEL
ANION GAP: 11 (ref 5–15)
BUN: 43 mg/dL — ABNORMAL HIGH (ref 6–20)
CALCIUM: 8.6 mg/dL — AB (ref 8.9–10.3)
CHLORIDE: 84 mmol/L — AB (ref 101–111)
CO2: 26 mmol/L (ref 22–32)
Creatinine, Ser: 1.67 mg/dL — ABNORMAL HIGH (ref 0.44–1.00)
GFR calc non Af Amer: 35 mL/min — ABNORMAL LOW (ref >60)
GFR, EST AFRICAN AMERICAN: 41 mL/min — AB (ref >60)
GLUCOSE: 106 mg/dL — AB (ref 65–99)
POTASSIUM: 4.5 mmol/L (ref 3.5–5.1)
Sodium: 121 mmol/L — ABNORMAL LOW (ref 135–145)

## 2015-06-02 LAB — CBC WITH DIFFERENTIAL/PLATELET
BASOS ABS: 0 10*3/uL (ref 0.0–0.1)
Basophils Relative: 0 %
Eosinophils Absolute: 0.2 10*3/uL (ref 0.0–0.7)
Eosinophils Relative: 1 %
HEMATOCRIT: 26 % — AB (ref 36.0–46.0)
HEMOGLOBIN: 8.9 g/dL — AB (ref 12.0–15.0)
LYMPHS PCT: 6 %
Lymphs Abs: 1.1 10*3/uL (ref 0.7–4.0)
MCH: 31.3 pg (ref 26.0–34.0)
MCHC: 34.2 g/dL (ref 30.0–36.0)
MCV: 91.5 fL (ref 78.0–100.0)
MONO ABS: 0.9 10*3/uL (ref 0.1–1.0)
Monocytes Relative: 5 %
NEUTROS ABS: 17.3 10*3/uL — AB (ref 1.7–7.7)
NEUTROS PCT: 89 %
Platelets: 375 10*3/uL (ref 150–400)
RBC: 2.84 MIL/uL — ABNORMAL LOW (ref 3.87–5.11)
RDW: 16.4 % — AB (ref 11.5–15.5)
WBC: 19.6 10*3/uL — ABNORMAL HIGH (ref 4.0–10.5)

## 2015-06-02 LAB — T4, FREE: FREE T4: 1.15 ng/dL — AB (ref 0.61–1.12)

## 2015-06-02 LAB — TSH: TSH: 9.505 u[IU]/mL — ABNORMAL HIGH (ref 0.350–4.500)

## 2015-06-02 MED ORDER — HEPARIN SOD (PORK) LOCK FLUSH 100 UNIT/ML IV SOLN
500.0000 [IU] | Freq: Once | INTRAVENOUS | Status: AC
  Administered 2015-06-02: 500 [IU] via INTRAVENOUS

## 2015-06-02 MED ORDER — DARBEPOETIN ALFA 60 MCG/0.3ML IJ SOSY
PREFILLED_SYRINGE | INTRAMUSCULAR | Status: AC
  Filled 2015-06-02: qty 0.3

## 2015-06-02 MED ORDER — SODIUM CHLORIDE 0.9 % IJ SOLN
10.0000 mL | Freq: Once | INTRAMUSCULAR | Status: AC
  Administered 2015-06-02: 10 mL via INTRAVENOUS

## 2015-06-02 MED ORDER — CYCLOBENZAPRINE HCL 10 MG PO TABS: 10.0000 mg | ORAL_TABLET | Freq: Three times a day (TID) | ORAL | Status: DC | PRN

## 2015-06-02 MED ORDER — DARBEPOETIN ALFA 60 MCG/0.3ML IJ SOSY
60.0000 ug | PREFILLED_SYRINGE | Freq: Once | INTRAMUSCULAR | Status: AC
  Administered 2015-06-02: 60 ug via SUBCUTANEOUS

## 2015-06-02 MED ORDER — FENTANYL 75 MCG/HR TD PT72: 75.0000 ug | MEDICATED_PATCH | TRANSDERMAL | Status: DC

## 2015-06-02 NOTE — Progress Notes (Signed)
Shelby Larson presents today for injection per the provider's orders.  Aranesp administration without incident; see MAR for injection details.  Patient tolerated procedure well and without incident.  No questions or complaints noted at this time.

## 2015-06-02 NOTE — Patient Instructions (Signed)
[place] at [facility] Discharge Instructions  RECOMMENDATIONS MADE BY THE CONSULTANT AND ANY TEST RESULTS WILL BE SENT TO YOUR REFERRING PHYSICIAN.  Port flush with lab work today. Aranesp today for hemoglobin of 8.9 Return as scheduled for lab work and injections. Return as scheduled for office visit.    Thank you for choosing [place] at [facility] to provide your oncology and hematology care.  To afford each patient quality time with our provider, please arrive at least 15 minutes before your scheduled appointment time.    You need to re-schedule your appointment should you arrive 10 or more minutes late.  We strive to give you quality time with our providers, and arriving late affects you and other patients whose appointments are after yours.  Also, if you no show three or more times for appointments you may be dismissed from the clinic at the providers discretion.     Again, thank you for choosing [facility].  Our hope is that these requests will decrease the amount of time that you wait before being seen by our physicians.       _____________________________________________________________  Should you have questions after your visit to [facility], please contact our office at [phone] between the hours of 8:30 a.m. and 4:30 p.m.  Voicemails left after 4:30 p.m. will not be returned until the following business day.  For prescription refill requests, have your pharmacy contact our office.

## 2015-06-02 NOTE — Patient Instructions (Addendum)
[place] at [facility] Discharge Instructions  RECOMMENDATIONS MADE BY THE CONSULTANT AND ANY TEST RESULTS WILL BE SENT TO YOUR REFERRING PHYSICIAN.  Exam completed by Dr Whitney Muse today Port flush with labs today aranesp of needed aranesp every 2 weeks with labs before Refills on your Fentanyl and flexeril given  Chest CT ordered Referral sent to [facility], they will contact you with the appt  Return to see the doctor in 3 months Please call the clinic if you have any questions or concerns   Thank you for choosing [place] at [facility] to provide your oncology and hematology care.  To afford each patient quality time with our provider, please arrive at least 15 minutes before your scheduled appointment time.    You need to re-schedule your appointment should you arrive 10 or more minutes late.  We strive to give you quality time with our providers, and arriving late affects you and other patients whose appointments are after yours.  Also, if you no show three or more times for appointments you may be dismissed from the clinic at the providers discretion.     Again, thank you for choosing [facility].  Our hope is that these requests will decrease the amount of time that you wait before being seen by our physicians.       _____________________________________________________________  Should you have questions after your visit to [facility], please contact our office at [phone] between the hours of 8:30 a.m. and 4:30 p.m.  Voicemails left after 4:30 p.m. will not be returned until the following business day.  For prescription refill requests, have your pharmacy contact our office.

## 2015-06-02 NOTE — Progress Notes (Signed)
..  Shelby Larson presented for Portacath access and flush.    Portacath located rt chest wall accessed with  H 20 needle.  Good blood return present. Portacath flushed with 49ml NS and 500U/39ml Heparin and needle removed intact.  Procedure tolerated well and without incident.

## 2015-06-02 NOTE — Progress Notes (Signed)
..  Shelby Larson presents today for injection per the provider's orders.  aranesp administration without incident; see MAR for injection details.  Patient tolerated procedure well and without incident.  No questions or complaints noted at this time.

## 2015-06-02 NOTE — Progress Notes (Signed)
Referral sent to [facility].  Records faxed on 12/7.  They will contact patient

## 2015-06-02 NOTE — Progress Notes (Signed)
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 She has completed concurrent XRT/cisplatin.   She feels as though "someone has bashed her knees and legs with a baseball bat", she is very achy. She continues to have difficulty eating, though the taste change is becoming more tolerable. She has been trying new types of milkshakes. She occasionally chokes while swallowing, though it is rare. She has not had a swallowing test. Sometimes she sleeps at night and others she does not. She is not sure if she is depressed as her mood has not changed. Denies drinking alcohol or smoking. She notes that her feeding tube gets "crudy" sometimes but is not painful.   She will see GI back at the end of this month on 06/23/15. She is not sure when her next appointment with [name] is.   I have suggested that she try lemon drops to help with her dry mouth, as well as vaseline on her lips. She notes that she drinks water often and continues to use biotene.     Malignant neoplasm of base of tongue ([place])   08/20/2014 Surgery Diagnosis 1. Tongue, biopsy, left base/ pharynx mass - INVASIVE MODERATELY DIFFERENTIATED  SQUAMOUS CELL CARCINOMA. - SEE COMMENT. 2. Lymph nodes, radical neck dissection, Left - THREE OF SIXTEEN LYMPH NODES POSITIVE FOR METASTATIC SQUAMOUS CELL [facility]   09/18/2014 Surgery Multiple extraction of tooth numbers 2, 14, 15, 18, and 31.  4 Quadrants of alveoloplasty.  Gross debridement of remaining dentition. Mandibular left lingual torus reduction   10/08/2014 - 11/19/2014 Chemotherapy Cisplatin every 21 days   10/09/2014 - 11/30/2014 Radiation Therapy [name] with twice daily dosing at the end of treatment at patient's request due to trip to [facility] against medical advice.   02/22/2015 PET scan Prior oropharyngeal mass has essentially resolved.  No findings specific for metastatic disease.   02/22/2015 Remission      Past Medical History  Diagnosis Date  . Heart murmur     as a child  . Chronic bronchitis ([place])   . Anxiety     panic attacks  . Depression   . Headache     onset a few months ago  . Neuropathy ([place])     had it in both hands  . Arthritis   . Fibromyalgia   . Malignant neoplasm of base of tongue ([place])     Base of tongue with neck metastases  . Anemia   . Rosacea   . Hypercholesterolemia   . Asthma   . COPD (chronic obstructive pulmonary disease) ([place])   . Seizures ([place])     takes Gabapentin  (last one in 2010); head trauma caused seizures.    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     is allergic to bee venom; penicillins; and latex.  [name] had no medications administered during this visit.  Past Surgical History  Procedure Laterality Date  . Wisdom tooth extraction    . Tonsillectomy Left 08/20/2014    Procedure: TONSILLECTOMY;  Surgeon: Ruby Cola, MD;  Location: [facility] OR;  Service: ENT;  Laterality: Left;  . Radical neck dissection Left 08/20/2014    Procedure:  RADICAL LEFT NECK DISSECTION ;  Surgeon: Ruby Cola, MD;  Location: [place];  Service: ENT;  Laterality: Left;  . Tracheostomy tube placement N/A 08/20/2014    Procedure: TRACHEOSTOMY - AWAKE;  Surgeon: Ruby Cola, MD;  Location: [place];  Service: ENT;  Laterality: N/A;  . Gastrostomy tube placement    . Multiple extractions with alveoloplasty N/A 09/17/2014    Procedure: Extraction of tooth #'s [phone] with alveoloplasty, mandibular left lingual torus reduction, and gross debridement of remaining teeth.;  Surgeon: Lenn Cal, DDS;  Location: [place];  Service: Oral Surgery;  Laterality: N/A;  . Portacath placement    . Colonoscopy with propofol N/A 04/15/2015    Procedure: COLONOSCOPY WITH PROPOFOL;  Surgeon: Daneil Dolin, MD;  Location[facility];  Service: Endoscopy;  Laterality: N/A;   in cecum at 1036,  64mn withdrawal time  . Polypectomy N/A 04/15/2015    Procedure: POLYPECTOMY;  Surgeon: RDaneil Dolin MD;  Location: [facility];  Service: Endoscopy;  Laterality: N/A;    Positive for joint pain and weakness.     Achy joints. Positive for loss of appetite and difficulty swallowing.    Taste change. Rare difficulty swallowing.  14 point review of systems was performed and is negative except as detailed under history of present illness and above    PHYSICAL EXAMINATION  ECOG PERFORMANCE STATUS: 1 - Symptomatic but completely ambulatory  Filed Vitals:   06/02/15 1000  BP: 93/69  Pulse: 107  Temp: 99.1 F (37.3 C)  Resp: 15    GENERAL:alert, no distress, well nourished, well developed, comfortable, in chemotherapy chair SKIN: skin color, texture, turgor are normal, no rashes or significant lesions. Bilateral neck, anterior neck erythema HEAD: Normocephalic, No masses, lesions, tenderness or abnormalities MOUTH: Dry mouth EYES: normal, PERRLA, EOMI, Conjunctiva are pink and non-injected EARS: External ears normal OROPHARYNX:lips, buccal mucosa, and tongue normal and  mucous membranes are moist minimal erythema  NECK: supple, trachea midline, tracheostomy noted, yellow to clear secretions bilateral skin erythema She is taking care of her skin.  It is soft. She has good range of motion LYMPH:  no palpable lymphadenopathy BREAST:not examined LUNGS: LLL rhonchi, R lung clear. Occasional wheezing throughout LLL HEART: regular rhythm, tachycardic ABDOMEN:non-tender PEG site C/D/I BACK: Back symmetric, no curvature. EXTREMITIES:less then 2 second capillary refill, no skin discoloration  NEURO: alert & oriented x 3 with fluent speech, gait normal    LABORATORY DATA: I have reviewed his laboratory data listed below Results for FTISHARA, PIZANO(MRN [id number]   Ref. Range 06/02/2015 11:30  Sodium Latest Ref Range: 135-145 mmol/L 121 (L)  Potassium Latest Ref Range: 3.5-5.1 mmol/L 4.5  Chloride Latest Ref Range: 101-111 mmol/L 84 (L)  CO2 Latest Ref Range: 22-32 mmol/L 26  BUN Latest Ref Range: 6-20 mg/dL 43 (H)  Creatinine Latest Ref Range: 0.44-1.00 mg/dL 1.67 (H)  Calcium Latest Ref Range: 8.9-10.3 mg/dL  8.6 (L)  EGFR (Non-African Amer.) Latest Ref Range: >60 mL/min 35 (L)  EGFR (African American) Latest Ref Range: >60 mL/min 41 (L)  Glucose Latest Ref Range: 65-99 mg/dL 106 (H)  Anion gap Latest Ref Range: 5-15  11  WBC Latest Ref Range: 4.0-10.5 K/uL 19.6 (H)  RBC Latest Ref Range: 3.87-5.11 MIL/uL 2.84 (L)  Hemoglobin Latest Ref Range: 12.0-15.0 g/dL 8.9 (L)  HCT Latest Ref Range: 36.0-46.0 % 26.0 (L)  MCV Latest Ref Range: 78.0-100.0 fL 91.5  MCH Latest Ref Range: 26.0-34.0 pg 31.3  MCHC Latest Ref Range: 30.0-36.0 g/dL 34.2  RDW Latest Ref Range: 11.5-15.5 % 16.4 (H)  Platelets Latest Ref Range: 150-400 K/uL 375  Neutrophils Latest Units: % 89  Lymphocytes Latest Units: % 6  Monocytes Relative Latest Units: % 5  Eosinophil Latest Units: % 1  Basophil Latest Units: % 0  NEUT# Latest Ref Range: 1.7-7.7 K/uL 17.3 (H)  Lymphocyte # Latest  Ref Range: 0.7-4.0 K/uL 1.1  Monocyte # Latest Ref Range: 0.1-1.0 K/uL 0.9  Eosinophils Absolute Latest Ref Range: 0.0-0.7 K/uL 0.2  Basophils Absolute Latest Ref Range: 0.0-0.1 K/uL 0.0      ASSESSMENT AND PLAN:  T2N2cMx HPV negative 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 She will be apprised of the results as soon as they are available.   I have refilled her flexeril and fentanyl today.  I have referred her to a psychiatrist for possible depression. She is not improving on her current antidepressant and has a known history of depression and ETOH abuse. She is agreeable to proceed with referral.   I have added a TSH and free T4 study to be done today.   I have increased her aranesp, she is going to continue to follow with nephrology.   We will see her again in 3 months for routine follow-up, sooner if needed.   All questions were answered. The patient knows to call the clinic with any problems, questions or concerns. We can certainly see the patient much sooner if necessary.   This document serves as a record of services personally performed by Ancil Linsey, MD. It was created on her behalf by Arlyce Harman, a trained medical scribe. The creation of this record is based on the scribe's personal observations and the provider's statements to them. This document has been checked and approved by the attending provider.  I have reviewed the above documentation for accuracy and completeness, and I agree with the above.  Kelby Fam. Antonina Deziel MD

## 2015-06-03 ENCOUNTER — Other Ambulatory Visit (HOSPITAL_COMMUNITY): Payer: Self-pay | Admitting: Emergency Medicine

## 2015-06-03 DIAGNOSIS — C140 Malignant neoplasm of pharynx, unspecified: Secondary | ICD-10-CM

## 2015-06-03 DIAGNOSIS — C01 Malignant neoplasm of base of tongue: Secondary | ICD-10-CM

## 2015-06-03 MED ORDER — LEVOTHYROXINE SODIUM 25 MCG PO TABS: 25.0000 ug | ORAL_TABLET | Freq: Every day | ORAL | Status: DC

## 2015-06-03 NOTE — Progress Notes (Signed)
Synthroid sent to pharmacy, labs entered for 8 weeks, and pt notified to pick prescription up from the pharmacy

## 2015-06-08 ENCOUNTER — Ambulatory Visit (HOSPITAL_COMMUNITY)
Admission: RE | Admit: 2015-06-08 | Discharge: 2015-06-08 | Disposition: A | Discharge status: Home / Self Care | Payer: 59 | Source: Ambulatory Visit | Attending: Hematology & Oncology | Admitting: Hematology & Oncology

## 2015-06-08 DIAGNOSIS — C140 Malignant neoplasm of pharynx, unspecified: Secondary | ICD-10-CM

## 2015-06-08 DIAGNOSIS — C01 Malignant neoplasm of base of tongue: Secondary | ICD-10-CM | POA: Diagnosis not present

## 2015-06-08 DIAGNOSIS — R9389 Abnormal findings on diagnostic imaging of other specified body structures: Secondary | ICD-10-CM

## 2015-06-08 DIAGNOSIS — R590 Localized enlarged lymph nodes: Secondary | ICD-10-CM | POA: Insufficient documentation

## 2015-06-08 DIAGNOSIS — C7989 Secondary malignant neoplasm of other specified sites: Secondary | ICD-10-CM | POA: Insufficient documentation

## 2015-06-08 DIAGNOSIS — R918 Other nonspecific abnormal finding of lung field: Secondary | ICD-10-CM | POA: Diagnosis not present

## 2015-06-08 DIAGNOSIS — R05 Cough: Secondary | ICD-10-CM | POA: Diagnosis not present

## 2015-06-08 DIAGNOSIS — Z923 Personal history of irradiation: Secondary | ICD-10-CM | POA: Insufficient documentation

## 2015-06-08 DIAGNOSIS — Z9221 Personal history of antineoplastic chemotherapy: Secondary | ICD-10-CM | POA: Insufficient documentation

## 2015-06-08 DIAGNOSIS — R635 Abnormal weight gain: Secondary | ICD-10-CM

## 2015-06-08 MED ORDER — IOHEXOL 300 MG/ML  SOLN
61.0000 mL | Freq: Once | INTRAMUSCULAR | Status: AC | PRN
  Administered 2015-06-08: 61 mL via INTRAVENOUS

## 2015-06-14 ENCOUNTER — Encounter (HOSPITAL_COMMUNITY): Payer: Self-pay | Admitting: Lab

## 2015-06-14 ENCOUNTER — Other Ambulatory Visit (HOSPITAL_COMMUNITY): Payer: Self-pay

## 2015-06-14 NOTE — Progress Notes (Signed)
Referral to Dr Luan Pulling records faxed on 12/19

## 2015-06-15 ENCOUNTER — Other Ambulatory Visit (HOSPITAL_COMMUNITY): Payer: Self-pay

## 2015-06-15 ENCOUNTER — Encounter (HOSPITAL_COMMUNITY): Payer: 59

## 2015-06-15 ENCOUNTER — Telehealth (HOSPITAL_COMMUNITY): Payer: Self-pay | Admitting: Emergency Medicine

## 2015-06-15 DIAGNOSIS — C01 Malignant neoplasm of base of tongue: Secondary | ICD-10-CM | POA: Diagnosis not present

## 2015-06-15 DIAGNOSIS — D649 Anemia, unspecified: Secondary | ICD-10-CM

## 2015-06-15 LAB — OCCULT BLOOD X 1 CARD TO LAB, STOOL
FECAL OCCULT BLD: NEGATIVE
Fecal Occult Bld: NEGATIVE
Fecal Occult Bld: NEGATIVE

## 2015-06-15 NOTE — Telephone Encounter (Signed)
pts mother notified of lab work, stool cards negative

## 2015-06-15 NOTE — Telephone Encounter (Signed)
-----   Message from Baird Cancer, PA-C sent at 06/15/2015 12:00 PM EST ----- I have reviewed all lab results which are normal or stable. Please inform the patient.

## 2015-06-16 ENCOUNTER — Other Ambulatory Visit (HOSPITAL_COMMUNITY): Payer: Self-pay | Admitting: Hematology & Oncology

## 2015-06-16 ENCOUNTER — Encounter (HOSPITAL_COMMUNITY): Payer: 59

## 2015-06-16 ENCOUNTER — Encounter (HOSPITAL_BASED_OUTPATIENT_CLINIC_OR_DEPARTMENT_OTHER): Payer: 59

## 2015-06-16 VITALS — BP 95/58 | HR 94 | Temp 98.1°F | Resp 18

## 2015-06-16 DIAGNOSIS — N189 Chronic kidney disease, unspecified: Secondary | ICD-10-CM

## 2015-06-16 DIAGNOSIS — Z95828 Presence of other vascular implants and grafts: Secondary | ICD-10-CM

## 2015-06-16 DIAGNOSIS — C01 Malignant neoplasm of base of tongue: Secondary | ICD-10-CM

## 2015-06-16 DIAGNOSIS — D631 Anemia in chronic kidney disease: Secondary | ICD-10-CM | POA: Diagnosis not present

## 2015-06-16 LAB — CBC WITH DIFFERENTIAL/PLATELET
Basophils Absolute: 0 10*3/uL (ref 0.0–0.1)
Basophils Relative: 0 %
EOS PCT: 1 %
Eosinophils Absolute: 0.1 10*3/uL (ref 0.0–0.7)
HCT: 21.8 % — ABNORMAL LOW (ref 36.0–46.0)
Hemoglobin: 7.4 g/dL — ABNORMAL LOW (ref 12.0–15.0)
LYMPHS ABS: 1 10*3/uL (ref 0.7–4.0)
LYMPHS PCT: 4 %
MCH: 31 pg (ref 26.0–34.0)
MCHC: 33.9 g/dL (ref 30.0–36.0)
MCV: 91.2 fL (ref 78.0–100.0)
MONO ABS: 1.3 10*3/uL — AB (ref 0.1–1.0)
Monocytes Relative: 6 %
Neutro Abs: 21.5 10*3/uL — ABNORMAL HIGH (ref 1.7–7.7)
Neutrophils Relative %: 90 %
PLATELETS: 456 10*3/uL — AB (ref 150–400)
RBC: 2.39 MIL/uL — ABNORMAL LOW (ref 3.87–5.11)
RDW: 16.8 % — AB (ref 11.5–15.5)
WBC: 24 10*3/uL — ABNORMAL HIGH (ref 4.0–10.5)

## 2015-06-16 LAB — BASIC METABOLIC PANEL
Anion gap: 11 (ref 5–15)
BUN: 47 mg/dL — AB (ref 6–20)
CALCIUM: 8.2 mg/dL — AB (ref 8.9–10.3)
CO2: 29 mmol/L (ref 22–32)
Chloride: 81 mmol/L — ABNORMAL LOW (ref 101–111)
Creatinine, Ser: 2.46 mg/dL — ABNORMAL HIGH (ref 0.44–1.00)
GFR calc Af Amer: 25 mL/min — ABNORMAL LOW (ref >60)
GFR, EST NON AFRICAN AMERICAN: 22 mL/min — AB (ref >60)
GLUCOSE: 126 mg/dL — AB (ref 65–99)
POTASSIUM: 4.2 mmol/L (ref 3.5–5.1)
Sodium: 121 mmol/L — ABNORMAL LOW (ref 135–145)

## 2015-06-16 MED ORDER — SODIUM CHLORIDE 0.9 % IJ SOLN
10.0000 mL | Freq: Once | INTRAMUSCULAR | Status: AC
  Administered 2015-06-16: 10 mL via INTRAVENOUS

## 2015-06-16 MED ORDER — HEPARIN SOD (PORK) LOCK FLUSH 100 UNIT/ML IV SOLN
500.0000 [IU] | Freq: Once | INTRAVENOUS | Status: AC
  Administered 2015-06-16: 500 [IU] via INTRAVENOUS

## 2015-06-16 MED ORDER — DARBEPOETIN ALFA 60 MCG/0.3ML IJ SOSY
60.0000 ug | PREFILLED_SYRINGE | Freq: Once | INTRAMUSCULAR | Status: AC
  Administered 2015-06-16: 60 ug via SUBCUTANEOUS

## 2015-06-16 MED ORDER — DARBEPOETIN ALFA 60 MCG/0.3ML IJ SOSY
PREFILLED_SYRINGE | INTRAMUSCULAR | Status: AC
  Filled 2015-06-16: qty 0.3

## 2015-06-16 MED ORDER — HEPARIN SOD (PORK) LOCK FLUSH 100 UNIT/ML IV SOLN
INTRAVENOUS | Status: AC
  Filled 2015-06-16: qty 5

## 2015-06-16 NOTE — Progress Notes (Signed)
Shelby Larson presented for Portacath access and flush. Proper placement of portacath confirmed by CXR. Portacath located right chest wall accessed with  H 20 needle. Good blood return present. Portacath flushed with 49ml NS and 500U/57ml Heparin and needle removed intact. Procedure without incident. Patient tolerated procedure well.  Discussed lab results and aranesp plan with Dr.Penland. [name] reports that patients sister stopped him in and told him [name] was smoking and drinking alcohol again and she is not telling us. He also reports the patient is living with her mother and the mother is in complete denial regarding the situation. Instructions discussed with patient and her sister. Verbalized understanding.  Shelby Larson presents today for injection per MD orders. Aranesp 60 mcg administered SQ in right Abdomen. Administration without incident. Patient tolerated well.

## 2015-06-16 NOTE — Patient Instructions (Signed)
[place] at [facility] Discharge Instructions  RECOMMENDATIONS MADE BY THE CONSULTANT AND ANY TEST RESULTS WILL BE SENT TO YOUR REFERRING PHYSICIAN.  Hemoglobin 7.4 today. Aranesp 60 mcg injection given today as ordered. Dr.Penland will continue current plan for Aranesp. If you are smoking DO NOT SMOKE as this may be contributing to lower hemoglobin results. If you are consuming any alcohol DO NOT DRINK ALCOHOL as this may be contributing to lower hemoglobin results. Return as scheduled.  Thank you for choosing [place] at [facility] to provide your oncology and hematology care.  To afford each patient quality time with our provider, please arrive at least 15 minutes before your scheduled appointment time.    You need to re-schedule your appointment should you arrive 10 or more minutes late.  We strive to give you quality time with our providers, and arriving late affects you and other patients whose appointments are after yours.  Also, if you no show three or more times for appointments you may be dismissed from the clinic at the providers discretion.     Again, thank you for choosing [facility].  Our hope is that these requests will decrease the amount of time that you wait before being seen by our physicians.       _____________________________________________________________  Should you have questions after your visit to [facility], please contact our office at [phone] between the hours of 8:30 a.m. and 4:30 p.m.  Voicemails left after 4:30 p.m. will not be returned until the following business day.  For prescription refill requests, have your pharmacy contact our office.

## 2015-06-23 ENCOUNTER — Encounter: Payer: Self-pay | Admitting: Nurse Practitioner

## 2015-06-23 ENCOUNTER — Ambulatory Visit (INDEPENDENT_AMBULATORY_CARE_PROVIDER_SITE_OTHER): Payer: 59 | Admitting: Nurse Practitioner

## 2015-06-23 ENCOUNTER — Other Ambulatory Visit: Payer: Self-pay

## 2015-06-23 VITALS — BP 96/62 | HR 94 | Temp 98.2°F | Ht 66.0 in | Wt 115.6 lb

## 2015-06-23 DIAGNOSIS — D631 Anemia in chronic kidney disease: Secondary | ICD-10-CM

## 2015-06-23 DIAGNOSIS — N189 Chronic kidney disease, unspecified: Secondary | ICD-10-CM

## 2015-06-23 NOTE — Progress Notes (Signed)
CC'ED TO PCP 

## 2015-06-23 NOTE — Progress Notes (Signed)
Referring Provider: [name], MD Primary Care Physician:  [name], MD Primary GI:  Dr. Gala Romney  Chief Complaint  Patient presents with  . Follow-up    HPI:   49 year old female presents for follow-up on anemia. Last seen in our office 03/23/2015 with documented anemia without iron deficiency and heme stool cards negative 3. 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 Had a trach at her last visit with decreased by mouth intake due to burning related to chemotherapy. PEG tube supplementation was ongoing at that time. Anemia deemed likely multifactorial given decline in renal function status post chemotherapy, chemotherapy side effects, and cancer. She was recommended for a colonoscopy which was subsequently completed on 04/15/2015. Colonoscopy found 2 colon polyps in the descending segment, both of which were removed and found to be tubular adenoma on surgical pathology. Recommended repeat surveillance colonoscopy in 5 years. Actively followed by oncology who last saw her on 06/02/2015. At that time oncology noted she continues to have prominent anemia and feels she will need further GI evaluation.  Today she states she's feeling a bit better. Had trach removed. Epopoetin continues and at increased dose. Also received blood about a month ago. Before then Hgb 7.4 on 06/16/15. Last Cr 06/16/15 2.46. Repeat heme cards negative x 3. Today she states her energy has been better the past 2 days, was much worse before then, sleeping a lot. Denies hematochezia, melena. Occasional minor irritation at PEG site. Used PEG pretty regularly. Nausea is very intermittent, will be asymptomatic for 1-2 weeks then have an onset of N/V which resolves itself. Denies chest pain, dyspnea, dizziness, lightheadedness, syncope, near syncope. Denies any other upper or lower GI symptoms.  Past Medical History  Diagnosis  Date  . Heart murmur     as a child  . Chronic bronchitis ([place])   . Anxiety     panic attacks  . Depression   . Headache     onset a few months ago  . Neuropathy ([place])     had it in both hands  . Arthritis   . Fibromyalgia   . Malignant neoplasm of base of tongue ([place])     Base of tongue with neck metastases  . Anemia   . Rosacea   . Hypercholesterolemia   . Asthma   . COPD (chronic obstructive pulmonary disease) ([place])   . Seizures ([place])     takes Gabapentin  (last one in 2010); head trauma caused seizures.    Past Surgical History  Procedure Laterality Date  . Wisdom tooth extraction    . Tonsillectomy Left 08/20/2014    Procedure: TONSILLECTOMY;  Surgeon: Ruby Cola, MD;  Location: [facility] OR;  Service: ENT;  Laterality: Left;  . Radical neck dissection Left 08/20/2014    Procedure: RADICAL LEFT NECK DISSECTION ;  Surgeon: Ruby Cola, MD;  Location: [place];  Service: ENT;  Laterality: Left;  . Tracheostomy tube placement N/A 08/20/2014    Procedure: TRACHEOSTOMY - AWAKE;  Surgeon: Ruby Cola, MD;  Location: [place];  Service: ENT;  Laterality: N/A;  . Gastrostomy tube placement    . Multiple extractions with alveoloplasty N/A 09/17/2014    Procedure: Extraction of tooth #'s [phone] with alveoloplasty, mandibular left lingual torus reduction, and gross debridement of remaining teeth.;  Surgeon: Lenn Cal, DDS;  Location: [place];  Service: Oral Surgery;  Laterality: N/A;  . Portacath placement    .  Colonoscopy with propofol N/A 04/15/2015    RMR: colonic polyps removed as described above.,  . Polypectomy N/A 04/15/2015    Procedure: POLYPECTOMY;  Surgeon: Daneil Dolin, MD;  Location: [facility];  Service: Endoscopy;  Laterality: N/A;    Current Outpatient Prescriptions  Medication Sig Dispense Refill  . cyclobenzaprine (FLEXERIL) 10 MG tablet Take 1 tablet (10 mg total) by mouth 3 (three) times daily as needed for muscle spasms. 30 tablet 1  . DULoxetine  (CYMBALTA) 30 MG capsule Take 30 mg PO x 5 days, then 60 mg PO thereafter 60 capsule 3  . fentaNYL (DURAGESIC - DOSED MCG/HR) 75 MCG/HR Place 1 patch (75 mcg total) onto the skin every 3 (three) days. 10 patch 0  . fluticasone (FLONASE) 50 MCG/ACT nasal spray Place 2 sprays into both nostrils daily. 16 g 3  . gabapentin (NEURONTIN) 600 MG tablet Take 600 mg by mouth 3 (three) times daily.    Marland Kitchen HYDROcodone-acetaminophen (NORCO) 10-325 MG tablet Take 1 tablet by mouth every 6 (six) hours as needed. 60 tablet 0  . levothyroxine (LEVOTHROID) 25 MCG tablet Take 1 tablet (25 mcg total) by mouth daily before breakfast. 30 tablet 1  . lidocaine-prilocaine (EMLA) cream Apply a quarter size amount to port site 1 hour prior to chemo. Do not rub in. Cover with plastic wrap. 30 g 2  . LORazepam (ATIVAN) 2 MG tablet Take 1 tablet (2 mg total) by mouth every 6 (six) hours as needed for anxiety. 30 tablet 1  . naloxegol oxalate (MOVANTIK) 25 MG TABS tablet Take 1 tablet (25 mg total) by mouth every morning. 30 tablet 4  . Nutritional Supplements (FEEDING SUPPLEMENT, JEVITY 1.5 CAL/FIBER,) LIQD 5 cans. Nocturnal feed. Rate100 ml/hr x 12 hours. Provides. 1778 kcals, 76 g pro, 900 mls fluid.  Flush 180 mls 5x a day.    . nystatin cream (MYCOSTATIN) Apply 1 application topically 2 (two) times daily. 30 g 0  . pantoprazole (PROTONIX) 40 MG tablet Take 1 tablet (40 mg total) by mouth daily. 30 tablet 2  . Potassium Bicarb-Citric Acid 20 MEQ TBEF Use via peg tube twice daily 120 each 3  . prochlorperazine (COMPAZINE) 10 MG tablet Take 1 tablet (10 mg total) by mouth every 6 (six) hours as needed for nausea or vomiting. 60 tablet 2  . QUEtiapine (SEROQUEL) 50 MG tablet Take 1 tablet (50 mg total) by mouth daily as needed (sleep). 30 tablet 1  . sodium fluoride (PREVIDENT 5000 PLUS) 1.1 % CREA dental cream Apply to tooth brush. Brush teeth for 2 minutes. Spit out excess-DO NOT swallow. Repeat nightly. 1 Tube prn  .  sucralfate (CARAFATE) 1 G tablet Dissolve 1 tablet in 10 mL H20 and swallow 30 min prior to meals and bedtime. 60 tablet 5  . tetrahydrozoline-zinc (VISINE-AC) 0.05-0.25 % ophthalmic solution Place 2 drops into both eyes daily as needed (dry eyes).    . valACYclovir (VALTREX) 500 MG tablet Take 1 tablet (500 mg total) by mouth 2 (two) times daily. 60 tablet 3  . polyethylene glycol-electrolytes (TRILYTE) 420 G solution Take 4,000 mLs by mouth as directed. (Patient not taking: Reported on 06/02/2015) 4000 mL 0   No current facility-administered medications for this visit.   Facility-Administered Medications Ordered in Other Visits  Medication Dose Route Frequency Provider Last Rate Last Dose  . sodium chloride 0.9 % injection 10 mL  10 mL Intravenous PRN Patrici Ranks, MD   10 mL at 03/25/15 1530  Allergies as of 06/23/2015 - Review Complete 06/23/2015  Allergen Reaction Noted  . Bee venom Swelling 08/20/2014  . Penicillins Other (See Comments) 08/08/2014  . Latex Swelling and Rash 08/08/2014    Family History  Problem Relation Age of Onset  . Neuropathy Mother   . Hypertension Mother   . Alcoholism Father   . Alcoholism Sister     Social History   Social History  . Marital Status: Single    Spouse Name: N/A  . Number of Children: N/A  . Years of Education: N/A   Social History Main Topics  . Smoking status: Former Smoker -- 0.50 packs/day for 20 years    Types: Cigarettes    Quit date: 06/18/2014  . Smokeless tobacco: Never Used  . Alcohol Use: Yes     Comment: occasional, 09/02/14 no longer using  . Drug Use: No  . Sexual Activity: Not Asked   Other Topics Concern  . None   Social History Narrative    Review of Systems: General: Negative for anorexia, fever, chills. Fatigue improved the past couple days. ENT: Negative for hoarseness, difficulty swallowing. CV: Negative for chest pain, angina, palpitations, peripheral edema.  Respiratory: Negative for dyspnea  at rest, cough, sputum, wheezing.  GI: See history of present illness. Endo: Negative for unusual weight change.  Heme: Negative for bruising or bleeding.   Physical Exam: BP 96/62 mmHg  Pulse 94  Temp(Src) 98.2 F (36.8 C) (Oral)  Ht 5\' 6"  (1.676 m)  Wt 115 lb 9.6 oz (52.436 kg)  BMI 18.67 kg/m2 General:   Alert and oriented. Pleasant and cooperative. Well-nourished and well-developed.  Head:  Normocephalic and atraumatic. Eyes:  Without icterus, sclera clear and conjunctiva pink.  Ears:  Normal auditory acuity. Throat/Neck:  Previous trach site well healed. Cardiovascular:  S1, S2 present without murmurs appreciated. Extremities without clubbing or edema. Respiratory:  Clear to auscultation bilaterally. No wheezes, rales, or rhonchi. No distress.  Gastrointestinal:  +BS, soft, non-tender and non-distended. No HSM noted. No guarding or rebound. No masses appreciated. PEG site clean and dry, no edema, erythema, or drainage noted. Rectal:  Deferred  Neurologic:  Alert and oriented x4;  grossly normal neurologically. Psych:  Alert and cooperative. Normal mood and affect.    06/23/2015 9:31 AM

## 2015-06-23 NOTE — Assessment & Plan Note (Signed)
Patient status post chemotherapy and radiation for base of tongue and pharyngeal cancer. Currently remains reliant on PEG feedings for supplemental nutrition. Postchemotherapy acute kidney injury with ongoing chronic kidney. Last creatinine 2.46. She has had persistent and significant anemia as well. This is likely multifactorial with cancer, chemotherapy, kidney disease. Stool Hemoccult cards have never shown positive. Colonoscopy was recently completed with no findings for profound anemia. At this point given her ongoing anemia and requiring iron and blood transfusions we will wrap upper GI evaluation with an endoscopy with possible Givens capsule study. We'll have her return for follow-up in 2 months. CBC was checked 7 days ago, no need to recheck today.  Proceed with EGD +/- Givens Capsule in the OR with propofol/MAC with Dr. Gala Romney in near future: the risks, benefits, and alternatives have been discussed with the patient in detail. The patient states understanding and desires to proceed.  The patient is currently on Seroquel, Ativan, Norco, Neurontin, fentanyl patch, Cymbalta, and Flexeril. Denies drug and alcohol use. No anticoagulants noted. Even polypharmacy due to cancer pain will provide for procedure and the OR on propofol/MAC to promote adequate sedation.

## 2015-06-23 NOTE — Patient Instructions (Signed)
1. We'll schedule your procedure for you. 2. Return for follow-up in 2 months.

## 2015-06-30 ENCOUNTER — Other Ambulatory Visit (HOSPITAL_COMMUNITY): Payer: Self-pay | Admitting: Oncology

## 2015-06-30 ENCOUNTER — Telehealth (HOSPITAL_COMMUNITY): Payer: Self-pay | Admitting: Emergency Medicine

## 2015-06-30 ENCOUNTER — Encounter (HOSPITAL_COMMUNITY): Payer: Medicaid Other | Attending: Hematology & Oncology

## 2015-06-30 DIAGNOSIS — C01 Malignant neoplasm of base of tongue: Secondary | ICD-10-CM | POA: Diagnosis not present

## 2015-06-30 DIAGNOSIS — D631 Anemia in chronic kidney disease: Secondary | ICD-10-CM | POA: Diagnosis not present

## 2015-06-30 DIAGNOSIS — N189 Chronic kidney disease, unspecified: Secondary | ICD-10-CM | POA: Insufficient documentation

## 2015-06-30 LAB — CBC WITH DIFFERENTIAL/PLATELET
BASOS ABS: 0 10*3/uL (ref 0.0–0.1)
BASOS PCT: 0 %
EOS PCT: 1 %
Eosinophils Absolute: 0.1 10*3/uL (ref 0.0–0.7)
HEMATOCRIT: 27.9 % — AB (ref 36.0–46.0)
Hemoglobin: 9.1 g/dL — ABNORMAL LOW (ref 12.0–15.0)
Lymphocytes Relative: 9 %
Lymphs Abs: 0.8 10*3/uL (ref 0.7–4.0)
MCH: 30.3 pg (ref 26.0–34.0)
MCHC: 32.6 g/dL (ref 30.0–36.0)
MCV: 93 fL (ref 78.0–100.0)
MONO ABS: 0.4 10*3/uL (ref 0.1–1.0)
Monocytes Relative: 4 %
NEUTROS ABS: 7.5 10*3/uL (ref 1.7–7.7)
Neutrophils Relative %: 86 %
PLATELETS: 388 10*3/uL (ref 150–400)
RBC: 3 MIL/uL — ABNORMAL LOW (ref 3.87–5.11)
RDW: 18.7 % — AB (ref 11.5–15.5)
WBC: 8.8 10*3/uL (ref 4.0–10.5)

## 2015-06-30 LAB — BASIC METABOLIC PANEL
ANION GAP: 11 (ref 5–15)
BUN: 43 mg/dL — AB (ref 6–20)
CHLORIDE: 92 mmol/L — AB (ref 101–111)
CO2: 30 mmol/L (ref 22–32)
Calcium: 9.4 mg/dL (ref 8.9–10.3)
Creatinine, Ser: 1.89 mg/dL — ABNORMAL HIGH (ref 0.44–1.00)
GFR, EST AFRICAN AMERICAN: 35 mL/min — AB (ref >60)
GFR, EST NON AFRICAN AMERICAN: 30 mL/min — AB (ref >60)
Glucose, Bld: 103 mg/dL — ABNORMAL HIGH (ref 65–99)
POTASSIUM: 4.2 mmol/L (ref 3.5–5.1)
SODIUM: 133 mmol/L — AB (ref 135–145)

## 2015-06-30 LAB — FERRITIN: FERRITIN: 531 ng/mL — AB (ref 11–307)

## 2015-06-30 MED ORDER — HEPARIN SOD (PORK) LOCK FLUSH 100 UNIT/ML IV SOLN
INTRAVENOUS | Status: AC
  Filled 2015-06-30: qty 5

## 2015-06-30 MED ORDER — SODIUM CHLORIDE 0.9 % IJ SOLN
10.0000 mL | INTRAMUSCULAR | Status: DC | PRN
  Administered 2015-06-30: 10 mL via INTRAVENOUS
  Filled 2015-06-30: qty 10

## 2015-06-30 MED ORDER — HEPARIN SOD (PORK) LOCK FLUSH 100 UNIT/ML IV SOLN
500.0000 [IU] | Freq: Once | INTRAVENOUS | Status: AC
  Administered 2015-06-30: 500 [IU] via INTRAVENOUS

## 2015-06-30 MED ORDER — DARBEPOETIN ALFA 60 MCG/0.3ML IJ SOSY
PREFILLED_SYRINGE | INTRAMUSCULAR | Status: AC
  Filled 2015-06-30: qty 0.3

## 2015-06-30 MED ORDER — DARBEPOETIN ALFA 60 MCG/0.3ML IJ SOSY
60.0000 ug | PREFILLED_SYRINGE | Freq: Once | INTRAMUSCULAR | Status: AC
  Administered 2015-06-30: 60 ug via SUBCUTANEOUS

## 2015-06-30 NOTE — Patient Instructions (Signed)
[place] at [facility] Discharge Instructions  RECOMMENDATIONS MADE BY THE CONSULTANT AND ANY TEST RESULTS WILL BE SENT TO YOUR REFERRING PHYSICIAN.  Port flush with lab work today. Aranesp injection today (hemoglobin 9/1). Return as scheduled every 2 weeks for lab work and possible injection.  Call the clinic with any questions or concerns.   Thank you for choosing [place] at [facility] to provide your oncology and hematology care.  To afford each patient quality time with our provider, please arrive at least 15 minutes before your scheduled appointment time.    You need to re-schedule your appointment should you arrive 10 or more minutes late.  We strive to give you quality time with our providers, and arriving late affects you and other patients whose appointments are after yours.  Also, if you no show three or more times for appointments you may be dismissed from the clinic at the providers discretion.     Again, thank you for choosing [facility].  Our hope is that these requests will decrease the amount of time that you wait before being seen by our physicians.       _____________________________________________________________  Should you have questions after your visit to [facility], please contact our office at [phone] between the hours of 8:30 a.m. and 4:30 p.m.  Voicemails left after 4:30 p.m. will not be returned until the following business day.  For prescription refill requests, have your pharmacy contact our office.

## 2015-06-30 NOTE — Progress Notes (Signed)
1105:  Shelby Larson presented for Portacath access and flush.  Portacath located right chest wall accessed with  H 20 needle.  Good blood return present. Portacath flushed with 31ml NS and 500U/66ml Heparin and needle removed intact.  Procedure tolerated well and without incident.    1200:  Shelby Larson presents today for injection per the provider's orders.  Aranesp administration without incident; see MAR for injection details.  Patient tolerated procedure well and without incident.  No questions or complaints noted at this time.

## 2015-06-30 NOTE — Telephone Encounter (Signed)
-----   Message from Baird Cancer, PA-C sent at 06/30/2015  2:03 PM EST ----- Better.  I have reviewed all lab results which are normal or stable. Please inform the patient.

## 2015-06-30 NOTE — Telephone Encounter (Signed)
pts mother notified that labs were good, would notify pt

## 2015-06-30 NOTE — Progress Notes (Signed)
See port flush encounter.  

## 2015-07-05 ENCOUNTER — Encounter (HOSPITAL_COMMUNITY): Payer: Self-pay | Admitting: Oncology

## 2015-07-05 ENCOUNTER — Encounter: Payer: Self-pay | Admitting: Internal Medicine

## 2015-07-07 ENCOUNTER — Telehealth (HOSPITAL_COMMUNITY): Payer: Self-pay | Admitting: *Deleted

## 2015-07-07 ENCOUNTER — Other Ambulatory Visit (HOSPITAL_COMMUNITY): Payer: Self-pay | Admitting: Oncology

## 2015-07-07 ENCOUNTER — Encounter (HOSPITAL_COMMUNITY): Payer: Self-pay | Admitting: Lab

## 2015-07-07 DIAGNOSIS — T402X5A Adverse effect of other opioids, initial encounter: Principal | ICD-10-CM

## 2015-07-07 DIAGNOSIS — K5903 Drug induced constipation: Secondary | ICD-10-CM

## 2015-07-07 MED ORDER — NALOXEGOL OXALATE 25 MG PO TABS: 25.0000 mg | ORAL_TABLET | Freq: Every morning | ORAL | Status: DC

## 2015-07-07 NOTE — Progress Notes (Signed)
Refaxed Referral fax to Dr Luan Pulling  appt 1/17 @845 .  Patient is aware

## 2015-07-07 NOTE — Telephone Encounter (Signed)
Pulmonary consultation request sent to Dr. Luan Pulling office on 06/14/15 by Lolita Lenz. Amy followed up today to see when they were going to schedule pt since pt hasn't heard anything from his office. Office stated that they never received the fax. Amy got confirmation on 06/14/15 that it went through. Yolanda at the office stated that this fax probably came through when Dr. Everette Rank was out and they were getting a lot of faxes at the time. Amy is now going to refax the request and office notes to Dr. Luan Pulling office today.

## 2015-07-12 NOTE — Patient Instructions (Signed)
[name]  07/12/2015     @PREFPERIOPPHARMACY @   Your procedure is scheduled on  07/19/2015 .  Report to [facility] at  845  A.M.  Call this number if you have problems the morning of surgery:  [phone]   Remember:  Do not eat food or drink liquids after midnight.  Take these medicines the morning of surgery with A SIP OF WATER  Flexaril, cymbalta, neurontin, hydrocodone, levothyroxine, lorazepam, protonix, compazine.   Do not wear jewelry, make-up or nail polish.  Do not wear lotions, powders, or perfumes.  You may wear deodorant.  Do not shave 48 hours prior to surgery.  Men may shave face and neck.  Do not bring valuables to the hospital.  [facility] is not responsible for any belongings or valuables.  Contacts, dentures or bridgework may not be worn into surgery.  Leave your suitcase in the car.  After surgery it may be brought to your room.  For patients admitted to the hospital, discharge time will be determined by your treatment team.  Patients discharged the day of surgery will not be allowed to drive home.   Name and phone number of your driver:   family Special instructions:  Follow the diet instructions given to you by Dr Roseanne Kaufman office.  Please read over the following fact sheets that you were given. Pain Booklet, Coughing and Deep Breathing, Surgical Site Infection Prevention, Anesthesia Post-op Instructions and Care and Recovery After Surgery      Esophagogastroduodenoscopy Esophagogastroduodenoscopy (EGD) is a procedure that is used to examine the lining of the esophagus, stomach, and first part of the small intestine (duodenum). A long, flexible, lighted tube with a camera attached (endoscope) is inserted down the throat to view these organs. This procedure is done to detect problems or abnormalities, such as inflammation, bleeding, ulcers, or growths, in order to treat them. The procedure lasts 5-20 minutes. It is usually an outpatient  procedure, but it may need to be performed in a hospital in emergency cases. LET [facility] CARE PROVIDER KNOW ABOUT:  Any allergies you have.  All medicines you are taking, including vitamins, herbs, eye drops, creams, and over-the-counter medicines.  Previous problems you or members of your family have had with the use of anesthetics.  Any blood disorders you have.  Previous surgeries you have had.  Medical conditions you have. RISKS AND COMPLICATIONS Generally, this is a safe procedure. However, problems can occur and include:  Infection.  Bleeding.  Tearing (perforation) of the esophagus, stomach, or duodenum.  Difficulty breathing or not being able to breathe.  Excessive sweating.  Spasms of the larynx.  Slowed heartbeat.  Low blood pressure. BEFORE THE PROCEDURE  Do not eat or drink anything after midnight on the night before the procedure or as directed by your health care provider.  Do not take your regular medicines before the procedure if your health care provider asks you not to. Ask your health care provider about changing or stopping those medicines.  If you wear dentures, be prepared to remove them before the procedure.  Arrange for someone to drive you home after the procedure. PROCEDURE  A numbing medicine (local anesthetic) may be sprayed in your throat for comfort and to stop you from gagging or coughing.  You will have an IV tube inserted in a vein in your hand or arm. You will receive medicines and fluids through this tube.  You will be given  a medicine to relax you (sedative).  A pain reliever will be given through the IV tube.  A mouth guard may be placed in your mouth to protect your teeth and to keep you from biting on the endoscope.  You will be asked to lie on your left side.  The endoscope will be inserted down your throat and into your esophagus, stomach, and duodenum.  Air will be put through the endoscope to allow your health  care provider to clearly view the lining of your esophagus.  The lining of your esophagus, stomach, and duodenum will be examined. During the exam, your health care provider may:  Remove tissue to be examined under a microscope (biopsy) for inflammation, infection, or other medical problems.  Remove growths.  Remove objects (foreign bodies) that are stuck.  Treat any bleeding with medicines or other devices that stop tissues from bleeding (hot cautery, clipping devices).  Widen (dilate) or stretch narrowed areas of your esophagus and stomach.  The endoscope will be withdrawn. AFTER THE PROCEDURE  You will be taken to a recovery area for observation. Your blood pressure, heart rate, breathing rate, and blood oxygen level will be monitored often until the medicines you were given have worn off.  Do not eat or drink anything until the numbing medicine has worn off and your gag reflex has returned. You may choke.  Your health care provider should be able to discuss his or her findings with you. It will take longer to discuss the test results if any biopsies were taken.   This information is not intended to replace advice given to you by your health care provider. Make sure you discuss any questions you have with your health care provider.   Document Released: 10/13/2004 Document Revised: 07/03/2014 Document Reviewed: 05/15/2012 Elsevier Interactive Patient Education 2016 [place]. Esophagogastroduodenoscopy, Care After Refer to this sheet in the next few weeks. These instructions provide you with information about caring for yourself after your procedure. Your health care provider may also give you more specific instructions. Your treatment has been planned according to current medical practices, but problems sometimes occur. Call your health care provider if you have any problems or questions after your procedure. WHAT TO EXPECT AFTER THE PROCEDURE After your procedure, it is typical  to feel:  Soreness in your throat.  Pain with swallowing.  Sick to your stomach (nauseous).  Bloated.  Dizzy.  Fatigued. HOME CARE INSTRUCTIONS  Do not eat or drink anything until the numbing medicine (local anesthetic) has worn off and your gag reflex has returned. You will know that the local anesthetic has worn off when you can swallow comfortably.  Do not drive or operate machinery until directed by your health care provider.  Take medicines only as directed by your health care provider. SEEK MEDICAL CARE IF:   You cannot stop coughing.  You are not urinating at all or less than usual. SEEK IMMEDIATE MEDICAL CARE IF:  You have difficulty swallowing.  You cannot eat or drink.  You have worsening throat or chest pain.  You have dizziness or lightheadedness or you faint.  You have nausea or vomiting.  You have chills.  You have a fever.  You have severe abdominal pain.  You have black, tarry, or bloody stools.   This information is not intended to replace advice given to you by your health care provider. Make sure you discuss any questions you have with your health care provider.   Document Released: 05/29/2012 Document  Revised: 07/03/2014 Document Reviewed: 05/29/2012 Elsevier Interactive Patient Education 2016 [employer] PATIENT INSTRUCTIONS POST-ANESTHESIA  IMMEDIATELY FOLLOWING SURGERY:  Do not drive or operate machinery for the first twenty four hours after surgery.  Do not make any important decisions for twenty four hours after surgery or while taking narcotic pain medications or sedatives.  If you develop intractable nausea and vomiting or a severe headache please notify your doctor immediately.  FOLLOW-UP:  Please make an appointment with your surgeon as instructed. You do not need to follow up with anesthesia unless specifically instructed to do so.  WOUND CARE INSTRUCTIONS (if applicable):  Keep a dry clean dressing on the anesthesia/puncture  wound site if there is drainage.  Once the wound has quit draining you may leave it open to air.  Generally you should leave the bandage intact for twenty four hours unless there is drainage.  If the epidural site drains for more than 36-48 hours please call the anesthesia department.  QUESTIONS?:  Please feel free to call your physician or the hospital operator if you have any questions, and they will be happy to assist you.

## 2015-07-13 ENCOUNTER — Encounter (HOSPITAL_COMMUNITY)
Admission: RE | Admit: 2015-07-13 | Discharge: 2015-07-13 | Disposition: A | Discharge status: Home / Self Care | Payer: Medicaid Other | Source: Ambulatory Visit | Attending: Internal Medicine | Admitting: Internal Medicine

## 2015-07-13 ENCOUNTER — Encounter (HOSPITAL_COMMUNITY): Payer: Self-pay

## 2015-07-13 ENCOUNTER — Inpatient Hospital Stay (HOSPITAL_COMMUNITY): Admission: RE | Admit: 2015-07-13 | Payer: Self-pay | Source: Ambulatory Visit

## 2015-07-13 ENCOUNTER — Telehealth (HOSPITAL_COMMUNITY): Payer: Self-pay | Admitting: *Deleted

## 2015-07-13 DIAGNOSIS — D649 Anemia, unspecified: Secondary | ICD-10-CM | POA: Diagnosis not present

## 2015-07-13 DIAGNOSIS — Z01818 Encounter for other preprocedural examination: Secondary | ICD-10-CM | POA: Insufficient documentation

## 2015-07-13 NOTE — Telephone Encounter (Signed)
She is not on treatment for her cancer.  I am not too sure what letter I am to provide.  She is on ESA treatment for her anemia of chronic renal disease.  If her lawyer wants something in particular, he/she will need to fax [place] some information.  Robynn Pane, PA-C 07/13/2015 4:03 PM

## 2015-07-14 ENCOUNTER — Encounter (HOSPITAL_COMMUNITY): Payer: Self-pay | Admitting: Oncology

## 2015-07-14 ENCOUNTER — Encounter (HOSPITAL_COMMUNITY): Payer: Medicaid Other | Attending: Hematology & Oncology

## 2015-07-14 ENCOUNTER — Encounter (HOSPITAL_COMMUNITY): Payer: Self-pay

## 2015-07-14 DIAGNOSIS — C01 Malignant neoplasm of base of tongue: Secondary | ICD-10-CM | POA: Diagnosis present

## 2015-07-14 DIAGNOSIS — Z95828 Presence of other vascular implants and grafts: Secondary | ICD-10-CM

## 2015-07-14 DIAGNOSIS — N189 Chronic kidney disease, unspecified: Secondary | ICD-10-CM | POA: Insufficient documentation

## 2015-07-14 DIAGNOSIS — D631 Anemia in chronic kidney disease: Secondary | ICD-10-CM | POA: Diagnosis not present

## 2015-07-14 LAB — CBC WITH DIFFERENTIAL/PLATELET
BASOS PCT: 0 %
Basophils Absolute: 0 10*3/uL (ref 0.0–0.1)
EOS ABS: 0 10*3/uL (ref 0.0–0.7)
Eosinophils Relative: 0 %
HCT: 31.6 % — ABNORMAL LOW (ref 36.0–46.0)
HEMOGLOBIN: 10.5 g/dL — AB (ref 12.0–15.0)
Lymphocytes Relative: 6 %
Lymphs Abs: 0.5 10*3/uL — ABNORMAL LOW (ref 0.7–4.0)
MCH: 31.5 pg (ref 26.0–34.0)
MCHC: 33.2 g/dL (ref 30.0–36.0)
MCV: 94.9 fL (ref 78.0–100.0)
MONOS PCT: 9 %
Monocytes Absolute: 0.7 10*3/uL (ref 0.1–1.0)
NEUTROS PCT: 85 %
Neutro Abs: 7 10*3/uL (ref 1.7–7.7)
Platelets: 279 10*3/uL (ref 150–400)
RBC: 3.33 MIL/uL — ABNORMAL LOW (ref 3.87–5.11)
RDW: 18.6 % — AB (ref 11.5–15.5)
WBC: 8.2 10*3/uL (ref 4.0–10.5)

## 2015-07-14 LAB — BASIC METABOLIC PANEL
Anion gap: 9 (ref 5–15)
BUN: 47 mg/dL — AB (ref 6–20)
CO2: 31 mmol/L (ref 22–32)
CREATININE: 1.67 mg/dL — AB (ref 0.44–1.00)
Calcium: 9.5 mg/dL (ref 8.9–10.3)
Chloride: 88 mmol/L — ABNORMAL LOW (ref 101–111)
GFR, EST AFRICAN AMERICAN: 41 mL/min — AB (ref >60)
GFR, EST NON AFRICAN AMERICAN: 35 mL/min — AB (ref >60)
Glucose, Bld: 99 mg/dL (ref 65–99)
POTASSIUM: 4 mmol/L (ref 3.5–5.1)
SODIUM: 128 mmol/L — AB (ref 135–145)

## 2015-07-14 MED ORDER — DARBEPOETIN ALFA 60 MCG/0.3ML IJ SOSY
PREFILLED_SYRINGE | INTRAMUSCULAR | Status: AC
  Filled 2015-07-14: qty 0.3

## 2015-07-14 MED ORDER — SODIUM CHLORIDE 0.9 % IJ SOLN: 10.0000 mL | INTRAMUSCULAR | Status: DC | PRN

## 2015-07-14 MED ORDER — HEPARIN SOD (PORK) LOCK FLUSH 100 UNIT/ML IV SOLN: 500.0000 [IU] | Freq: Once | INTRAVENOUS | Status: DC

## 2015-07-14 MED ORDER — DARBEPOETIN ALFA 60 MCG/0.3ML IJ SOSY
60.0000 ug | PREFILLED_SYRINGE | Freq: Once | INTRAMUSCULAR | Status: AC
Start: 2015-07-14 — End: 2015-07-14
  Administered 2015-07-14: 60 ug via SUBCUTANEOUS

## 2015-07-14 MED ORDER — HEPARIN SOD (PORK) LOCK FLUSH 100 UNIT/ML IV SOLN
500.0000 [IU] | Freq: Once | INTRAVENOUS | Status: AC
  Administered 2015-07-14: 500 [IU] via INTRAVENOUS

## 2015-07-14 MED ORDER — HEPARIN SOD (PORK) LOCK FLUSH 100 UNIT/ML IV SOLN
INTRAVENOUS | Status: AC
  Filled 2015-07-14: qty 5

## 2015-07-14 MED ORDER — SODIUM CHLORIDE 0.9 % IJ SOLN
10.0000 mL | INTRAMUSCULAR | Status: AC | PRN
  Administered 2015-07-14: 10 mL via INTRAVENOUS
  Filled 2015-07-14: qty 10

## 2015-07-14 NOTE — Patient Instructions (Signed)
Patient to return to cancer center this afternoon to see if aranesp needed

## 2015-07-14 NOTE — Progress Notes (Signed)
Shelby Larson presents today for injection per MD orders. Aranesp 41mcg administered SQ in right Abdomen. Administration without incident. Patient tolerated well.

## 2015-07-14 NOTE — Addendum Note (Signed)
Addended by: Kurtis Bushman A on: 07/14/2015 05:18 PM   Modules accepted: Orders, SmartSet

## 2015-07-14 NOTE — Patient Instructions (Signed)
[facility] at [facility] Discharge Instructions  RECOMMENDATIONS MADE BY THE CONSULTANT AND ANY TEST RESULTS WILL BE SENT TO YOUR REFERRING PHYSICIAN.  Aranesp today.    Thank you for choosing [facility] at [facility] to provide your oncology and hematology care.  To afford each patient quality time with our provider, please arrive at least 15 minutes before your scheduled appointment time.   Beginning January 23rd 2017 lab work for the Cancer Center will be done in the  Main lab at [place] on 1st floor. If you have a lab appointment with the Cancer Center please come in thru the  Main Entrance and check in at the main information desk  You need to re-schedule your appointment should you arrive 10 or more minutes late.  We strive to give you quality time with our providers, and arriving late affects you and other patients whose appointments are after yours.  Also, if you no show three or more times for appointments you may be dismissed from the clinic at the providers discretion.     Again, thank you for choosing [facility].  Our hope is that these requests will decrease the amount of time that you wait before being seen by our physicians.       _____________________________________________________________  Should you have questions after your visit to [facility], please contact our office at [phone] between the hours of 8:30 a.m. and 4:30 p.m.  Voicemails left after 4:30 p.m. will not be returned until the following business day.  For prescription refill requests, have your pharmacy contact our office.     

## 2015-07-14 NOTE — Progress Notes (Signed)
..  Shelby Larson presented for Portacath access and flush.  Portacath located rt chest wall accessed with  H 20 needle.  Good blood return present. Portacath flushed with 44ml NS and 500U/75ml Heparin and needle removed intact.  Procedure tolerated well and without incident.

## 2015-07-16 DIAGNOSIS — J45909 Unspecified asthma, uncomplicated: Secondary | ICD-10-CM | POA: Diagnosis not present

## 2015-07-16 DIAGNOSIS — Z87891 Personal history of nicotine dependence: Secondary | ICD-10-CM | POA: Diagnosis not present

## 2015-07-16 DIAGNOSIS — F329 Major depressive disorder, single episode, unspecified: Secondary | ICD-10-CM | POA: Diagnosis not present

## 2015-07-16 DIAGNOSIS — Z85810 Personal history of malignant neoplasm of tongue: Secondary | ICD-10-CM | POA: Diagnosis not present

## 2015-07-16 DIAGNOSIS — Z79899 Other long term (current) drug therapy: Secondary | ICD-10-CM | POA: Diagnosis not present

## 2015-07-16 DIAGNOSIS — C7989 Secondary malignant neoplasm of other specified sites: Secondary | ICD-10-CM | POA: Diagnosis not present

## 2015-07-16 DIAGNOSIS — D649 Anemia, unspecified: Secondary | ICD-10-CM | POA: Diagnosis not present

## 2015-07-16 DIAGNOSIS — J449 Chronic obstructive pulmonary disease, unspecified: Secondary | ICD-10-CM | POA: Diagnosis not present

## 2015-07-16 DIAGNOSIS — E7800 Pure hypercholesterolemia, unspecified: Secondary | ICD-10-CM | POA: Diagnosis not present

## 2015-07-16 DIAGNOSIS — F410 Panic disorder [episodic paroxysmal anxiety] without agoraphobia: Secondary | ICD-10-CM | POA: Diagnosis not present

## 2015-07-16 LAB — HCG, SERUM, QUALITATIVE: PREG SERUM: NEGATIVE

## 2015-07-16 NOTE — OR Nursing (Signed)
Sodium 128, Bun 47, Cr. 1.67, reported to Dr. Milford Cage . No further orders given.

## 2015-07-18 ENCOUNTER — Encounter (HOSPITAL_COMMUNITY): Payer: Self-pay | Admitting: Hematology & Oncology

## 2015-07-19 ENCOUNTER — Ambulatory Visit (HOSPITAL_COMMUNITY)
Admission: RE | Admit: 2015-07-19 | Discharge: 2015-07-19 | Disposition: A | Discharge status: Home / Self Care | Payer: Medicaid Other | Source: Ambulatory Visit | Attending: Internal Medicine | Admitting: Internal Medicine

## 2015-07-19 ENCOUNTER — Ambulatory Visit (HOSPITAL_COMMUNITY): Payer: Medicaid Other | Admitting: Anesthesiology

## 2015-07-19 ENCOUNTER — Encounter (HOSPITAL_COMMUNITY)
Admission: RE | Disposition: A | Discharge status: Home / Self Care | Payer: Self-pay | Source: Ambulatory Visit | Attending: Internal Medicine

## 2015-07-19 ENCOUNTER — Encounter (HOSPITAL_COMMUNITY): Payer: Self-pay | Admitting: *Deleted

## 2015-07-19 DIAGNOSIS — Z79899 Other long term (current) drug therapy: Secondary | ICD-10-CM | POA: Insufficient documentation

## 2015-07-19 DIAGNOSIS — J45909 Unspecified asthma, uncomplicated: Secondary | ICD-10-CM | POA: Insufficient documentation

## 2015-07-19 DIAGNOSIS — J449 Chronic obstructive pulmonary disease, unspecified: Secondary | ICD-10-CM | POA: Insufficient documentation

## 2015-07-19 DIAGNOSIS — F329 Major depressive disorder, single episode, unspecified: Secondary | ICD-10-CM | POA: Diagnosis not present

## 2015-07-19 DIAGNOSIS — Z85810 Personal history of malignant neoplasm of tongue: Secondary | ICD-10-CM | POA: Insufficient documentation

## 2015-07-19 DIAGNOSIS — D649 Anemia, unspecified: Secondary | ICD-10-CM | POA: Insufficient documentation

## 2015-07-19 DIAGNOSIS — Z87891 Personal history of nicotine dependence: Secondary | ICD-10-CM | POA: Insufficient documentation

## 2015-07-19 DIAGNOSIS — E7800 Pure hypercholesterolemia, unspecified: Secondary | ICD-10-CM | POA: Insufficient documentation

## 2015-07-19 DIAGNOSIS — C7989 Secondary malignant neoplasm of other specified sites: Secondary | ICD-10-CM | POA: Insufficient documentation

## 2015-07-19 DIAGNOSIS — F410 Panic disorder [episodic paroxysmal anxiety] without agoraphobia: Secondary | ICD-10-CM | POA: Insufficient documentation

## 2015-07-19 HISTORY — PX: GIVENS CAPSULE STUDY: SHX5432

## 2015-07-19 HISTORY — PX: ESOPHAGOGASTRODUODENOSCOPY (EGD) WITH PROPOFOL: SHX5813

## 2015-07-19 SURGERY — ESOPHAGOGASTRODUODENOSCOPY (EGD) WITH PROPOFOL
Anesthesia: Monitor Anesthesia Care

## 2015-07-19 MED ORDER — GLYCOPYRROLATE 0.2 MG/ML IJ SOLN
0.2000 mg | Freq: Once | INTRAMUSCULAR | Status: AC
  Administered 2015-07-19: 0.2 mg via INTRAVENOUS
  Filled 2015-07-19: qty 1

## 2015-07-19 MED ORDER — LACTATED RINGERS IV SOLN
INTRAVENOUS | Status: DC
  Administered 2015-07-19 (×2): via INTRAVENOUS

## 2015-07-19 MED ORDER — PROPOFOL 10 MG/ML IV BOLUS
INTRAVENOUS | Status: AC
  Filled 2015-07-19: qty 20

## 2015-07-19 MED ORDER — ONDANSETRON HCL 4 MG/2ML IJ SOLN
INTRAMUSCULAR | Status: AC
  Filled 2015-07-19: qty 2

## 2015-07-19 MED ORDER — LIDOCAINE VISCOUS 2 % MT SOLN
OROMUCOSAL | Status: AC
  Filled 2015-07-19: qty 15

## 2015-07-19 MED ORDER — LIDOCAINE VISCOUS 2 % MT SOLN
6.0000 mL | Freq: Once | OROMUCOSAL | Status: AC
  Administered 2015-07-19: 6 mL via OROMUCOSAL

## 2015-07-19 MED ORDER — FENTANYL CITRATE (PF) 100 MCG/2ML IJ SOLN: 25.0000 ug | INTRAMUSCULAR | Status: DC | PRN

## 2015-07-19 MED ORDER — PROPOFOL 10 MG/ML IV BOLUS
INTRAVENOUS | Status: DC | PRN
  Administered 2015-07-19 (×2): 10 mg via INTRAVENOUS

## 2015-07-19 MED ORDER — ONDANSETRON HCL 4 MG/2ML IJ SOLN
4.0000 mg | Freq: Once | INTRAMUSCULAR | Status: DC | PRN
Start: 2015-07-19 — End: 2015-07-19

## 2015-07-19 MED ORDER — ONDANSETRON HCL 4 MG/2ML IJ SOLN
4.0000 mg | Freq: Once | INTRAMUSCULAR | Status: AC
  Administered 2015-07-19: 4 mg via INTRAVENOUS

## 2015-07-19 MED ORDER — PROPOFOL 500 MG/50ML IV EMUL
INTRAVENOUS | Status: DC | PRN
  Administered 2015-07-19: 100 ug/kg/min via INTRAVENOUS

## 2015-07-19 NOTE — Discharge Instructions (Addendum)
EGD Discharge instructions Please read the instructions outlined below and refer to this sheet in the next few weeks. These discharge instructions provide you with general information on caring for yourself after you leave the hospital. Your doctor may also give you specific instructions. While your treatment has been planned according to the most current medical practices available, unavoidable complications occasionally occur. If you have any problems or questions after discharge, please call your doctor. ACTIVITY  You may resume your regular activity but move at a slower pace for the next 24 hours.   Take frequent rest periods for the next 24 hours.   Walking will help expel (get rid of) the air and reduce the bloated feeling in your abdomen.   No driving for 24 hours (because of the anesthesia (medicine) used during the test).   You may shower.   Do not sign any important legal documents or operate any machinery for 24 hours (because of the anesthesia used during the test).  NUTRITION 1. Drink plenty of fluids.  2. You may resume your normal diet.  3. Begin with a light meal and progress to your normal diet.  4. Avoid alcoholic beverages for 24 hours or as instructed by your caregiver.  MEDICATIONS  You may resume your normal medications unless your caregiver tells you otherwise.  WHAT YOU CAN EXPECT TODAY 1. You may experience abdominal discomfort such as a feeling of fullness or gas pains.  FOLLOW-UP  Your doctor will discuss the results of your test with you.  SEEK IMMEDIATE MEDICAL ATTENTION IF ANY OF THE FOLLOWING OCCUR:  Excessive nausea (feeling sick to your stomach) and/or vomiting.   Severe abdominal pain and distention (swelling).   Trouble swallowing.   Temperature over 101 F (37.8 C).   Rectal bleeding or vomiting of blood.    Resume usual use of PEG tube  Follow Instructions  Use Chloraseptic Spray  for Any Transient Sore Throat Which May Be  Experienced  Care of a Feeding Tube People who have trouble swallowing or cannot take food or medicine by mouth are sometimes given feeding tubes. A feeding tube can go into the nose and down to the stomach or through the skin in the abdomen and into the stomach or small bowel. Some of the names of these feeding tubes are gastrostomy tubes, PEG lines, nasogastric tubes, and gastrojejunostomy tubes.  SUPPLIES NEEDED TO CARE FOR THE TUBE SITE  Clean gloves.  Clean wash cloth, gauze pads, or soft paper towel.  Cotton swabs.  Skin barrier ointment or cream.  Soap and water.  Pre-cut foam pads or gauze (that go around the tube).  Tube tape. TUBE SITE CARE 5. Have all supplies ready and available. 6. Wash hands well. 7. Put on clean gloves. 8. Remove the soiled foam pad or gauze, if present, that is found under the tube stabilizer. Change the foam pad or gauze daily or when soiled or moist. 9. Check the skin around the tube site for redness, rash, swelling, drainage, or extra tissue growth. If you notice any of these, call your caregiver. 10. Moisten gauze and cotton swabs with water and soap. 11. Wipe the area closest to the tube (right near the stoma) with cotton swabs. Wipe the surrounding skin with moistened gauze. Rinse with water. 12. Dry the skin and stoma site with a dry gauze pad or soft paper towel. Do not use antibiotic ointments at the tube site. 13. If the skin is red, apply a skin barrier cream  or ointment (such as petroleum jelly) in a circular motion, using a cotton swab. The cream or ointment will provide a moisture barrier for the skin and helps with wound healing. 14. Apply a new pre-cut foam pad or gauze around the tube. Secure it with tape around the edges. If no drainage is present, foam pads or gauze may be left off. 15. Use tape or an anchoring device to fasten the feeding tube to the skin for comfort or as directed. Rotate where you tape the tube to avoid skin damage  from the adhesive. 16. Position the person in a semi-upright position (30-45 degree angle). 34. Throw away used supplies. 18. Remove gloves. 19. Wash hands. SUPPLIES NEEDED TO FLUSH A FEEDING TUBE  Clean gloves.  60 mL syringe (that connects to the feeding tube).  Towel.  Water. FLUSHING A FEEDING TUBE  2. Have all supplies ready and available. 3. Wash hands well. 4. Put on clean gloves. 5. Draw up 30 mL of water in the syringe. 6. Kink the feeding tube while disconnecting it from the feeding-bag tubing or while removing the plug at the end of the tube. Kinking closes the tube and prevents secretions in the tube from spilling out. 7. Insert the tip of the syringe into the end of the feeding tube. Release the kink. Slowly inject the water. 8. If unable to inject the water, the person with the feeding tube should lay on his or her left side. The tip of the tube may be against the stomach wall, blocking fluid flow. Changing positions may move the tip away from the stomach wall. After repositioning, try injecting the water again. 9. After injecting the water, remove the syringe. 10. Always flush before giving the first medicine, between medicines, and after the final medicine before starting a feeding. This prevents medicines from clogging the tube. 11. Throw away used supplies. 12. Remove gloves. 13. Wash hands.   This information is not intended to replace advice given to you by your health care provider. Make sure you discuss any questions you have with your health care provider.   Document Released: 06/12/2005 Document Revised: 05/29/2012 Document Reviewed: 01/25/2012 Elsevier Interactive Patient Education [employer].

## 2015-07-19 NOTE — Anesthesia Preprocedure Evaluation (Signed)
Anesthesia Evaluation  Patient identified by MRN, date of birth, ID band Patient awake  General Assessment Comment:Very drowsy - took her ativan this am.  Reviewed: Allergy & Precautions, NPO status , Patient's Chart, lab work & pertinent test results  Airway Mallampati: III  TM Distance: >3 FB     Dental  (+) Teeth Intact, Poor Dentition   Pulmonary asthma , COPD, former smoker,  Base tongue CA, RND + trach  2016   breath sounds clear to auscultation       Cardiovascular negative cardio ROS   Rhythm:Regular Rate:Normal     Neuro/Psych  Headaches, Seizures -,  PSYCHIATRIC DISORDERS Anxiety Depression    GI/Hepatic negative GI ROS,   Endo/Other    Renal/GU      Musculoskeletal  (+) Arthritis , Fibromyalgia -  Abdominal   Peds  Hematology  (+) anemia ,   Anesthesia Other Findings   Reproductive/Obstetrics                             Anesthesia Physical Anesthesia Plan  ASA: III  Anesthesia Plan: MAC   Post-op Pain Management:    Induction: Intravenous  Airway Management Planned: Simple Face Mask  Additional Equipment:   Intra-op Plan:   Post-operative Plan:   Informed Consent: I have reviewed the patients History and Physical, chart, labs and discussed the procedure including the risks, benefits and alternatives for the proposed anesthesia with the patient or authorized representative who has indicated his/her understanding and acceptance.     Plan Discussed with:   Anesthesia Plan Comments:         Anesthesia Quick Evaluation

## 2015-07-19 NOTE — H&P (View-Only) (Signed)
Referring Provider: [name], MD Primary Care Physician:  [name], MD Primary GI:  Dr. Gala Romney  Chief Complaint  Patient presents with  . Follow-up    HPI:   50 year old female presents for follow-up on anemia. Last seen in our office 03/23/2015 with documented anemia without iron deficiency and heme stool cards negative 3. 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 Had a trach at her last visit with decreased by mouth intake due to burning related to chemotherapy. PEG tube supplementation was ongoing at that time. Anemia deemed likely multifactorial given decline in renal function status post chemotherapy, chemotherapy side effects, and cancer. She was recommended for a colonoscopy which was subsequently completed on 04/15/2015. Colonoscopy found 2 colon polyps in the descending segment, both of which were removed and found to be tubular adenoma on surgical pathology. Recommended repeat surveillance colonoscopy in 5 years. Actively followed by oncology who last saw her on 06/02/2015. At that time oncology noted she continues to have prominent anemia and feels she will need further GI evaluation.  Today she states she's feeling a bit better. Had trach removed. Epopoetin continues and at increased dose. Also received blood about a month ago. Before then Hgb 7.4 on 06/16/15. Last Cr 06/16/15 2.46. Repeat heme cards negative x 3. Today she states her energy has been better the past 2 days, was much worse before then, sleeping a lot. Denies hematochezia, melena. Occasional minor irritation at PEG site. Used PEG pretty regularly. Nausea is very intermittent, will be asymptomatic for 1-2 weeks then have an onset of N/V which resolves itself. Denies chest pain, dyspnea, dizziness, lightheadedness, syncope, near syncope. Denies any other upper or lower GI symptoms.  Past Medical History  Diagnosis  Date  . Heart murmur     as a child  . Chronic bronchitis ([place])   . Anxiety     panic attacks  . Depression   . Headache     onset a few months ago  . Neuropathy ([place])     had it in both hands  . Arthritis   . Fibromyalgia   . Malignant neoplasm of base of tongue ([place])     Base of tongue with neck metastases  . Anemia   . Rosacea   . Hypercholesterolemia   . Asthma   . COPD (chronic obstructive pulmonary disease) ([place])   . Seizures ([place])     takes Gabapentin  (last one in 2010); head trauma caused seizures.    Past Surgical History  Procedure Laterality Date  . Wisdom tooth extraction    . Tonsillectomy Left 08/20/2014    Procedure: TONSILLECTOMY;  Surgeon: Ruby Cola, MD;  Location[facility] OR;  Service: ENT;  Laterality: Left;  . Radical neck dissection Left 08/20/2014    Procedure: RADICAL LEFT NECK DISSECTION ;  Surgeon: Ruby Cola, MD;  Location: [facility];  Service: ENT;  Laterality: Left;  . Tracheostomy tube placement N/A 08/20/2014    Procedure: TRACHEOSTOMY - AWAKE;  Surgeon: Ruby Cola, MD;  Location: [facility];  Service: ENT;  Laterality: N/A;  . Gastrostomy tube placement    . Multiple extractions with alveoloplasty N/A 09/17/2014    Procedure: Extraction of tooth #'s [id number] with alveoloplasty, mandibular left lingual torus reduction, and gross debridement of remaining teeth.;  Surgeon: Lenn Cal, DDS;  Location: [facility];  Service: Oral Surgery;  Laterality: N/A;  . Portacath placement    .  Colonoscopy with propofol N/A 04/15/2015    RMR: colonic polyps removed as described above.,  . Polypectomy N/A 04/15/2015    Procedure: POLYPECTOMY;  Surgeon: Daneil Dolin, MD;  Location: [facility];  Service: Endoscopy;  Laterality: N/A;    Current Outpatient Prescriptions  Medication Sig Dispense Refill  . cyclobenzaprine (FLEXERIL) 10 MG tablet Take 1 tablet (10 mg total) by mouth 3 (three) times daily as needed for muscle spasms. 30 tablet 1  . DULoxetine  (CYMBALTA) 30 MG capsule Take 30 mg PO x 5 days, then 60 mg PO thereafter 60 capsule 3  . fentaNYL (DURAGESIC - DOSED MCG/HR) 75 MCG/HR Place 1 patch (75 mcg total) onto the skin every 3 (three) days. 10 patch 0  . fluticasone (FLONASE) 50 MCG/ACT nasal spray Place 2 sprays into both nostrils daily. 16 g 3  . gabapentin (NEURONTIN) 600 MG tablet Take 600 mg by mouth 3 (three) times daily.    Marland Kitchen HYDROcodone-acetaminophen (NORCO) 10-325 MG tablet Take 1 tablet by mouth every 6 (six) hours as needed. 60 tablet 0  . levothyroxine (LEVOTHROID) 25 MCG tablet Take 1 tablet (25 mcg total) by mouth daily before breakfast. 30 tablet 1  . lidocaine-prilocaine (EMLA) cream Apply a quarter size amount to port site 1 hour prior to chemo. Do not rub in. Cover with plastic wrap. 30 g 2  . LORazepam (ATIVAN) 2 MG tablet Take 1 tablet (2 mg total) by mouth every 6 (six) hours as needed for anxiety. 30 tablet 1  . naloxegol oxalate (MOVANTIK) 25 MG TABS tablet Take 1 tablet (25 mg total) by mouth every morning. 30 tablet 4  . Nutritional Supplements (FEEDING SUPPLEMENT, JEVITY 1.5 CAL/FIBER,) LIQD 5 cans. Nocturnal feed. Rate100 ml/hr x 12 hours. Provides. 1778 kcals, 76 g pro, 900 mls fluid.  Flush 180 mls 5x a day.    . nystatin cream (MYCOSTATIN) Apply 1 application topically 2 (two) times daily. 30 g 0  . pantoprazole (PROTONIX) 40 MG tablet Take 1 tablet (40 mg total) by mouth daily. 30 tablet 2  . Potassium Bicarb-Citric Acid 20 MEQ TBEF Use via peg tube twice daily 120 each 3  . prochlorperazine (COMPAZINE) 10 MG tablet Take 1 tablet (10 mg total) by mouth every 6 (six) hours as needed for nausea or vomiting. 60 tablet 2  . QUEtiapine (SEROQUEL) 50 MG tablet Take 1 tablet (50 mg total) by mouth daily as needed (sleep). 30 tablet 1  . sodium fluoride (PREVIDENT 5000 PLUS) 1.1 % CREA dental cream Apply to tooth brush. Brush teeth for 2 minutes. Spit out excess-DO NOT swallow. Repeat nightly. 1 Tube prn  .  sucralfate (CARAFATE) 1 G tablet Dissolve 1 tablet in 10 mL H20 and swallow 30 min prior to meals and bedtime. 60 tablet 5  . tetrahydrozoline-zinc (VISINE-AC) 0.05-0.25 % ophthalmic solution Place 2 drops into both eyes daily as needed (dry eyes).    . valACYclovir (VALTREX) 500 MG tablet Take 1 tablet (500 mg total) by mouth 2 (two) times daily. 60 tablet 3  . polyethylene glycol-electrolytes (TRILYTE) 420 G solution Take 4,000 mLs by mouth as directed. (Patient not taking: Reported on 06/02/2015) 4000 mL 0   No current facility-administered medications for this visit.   Facility-Administered Medications Ordered in Other Visits  Medication Dose Route Frequency Provider Last Rate Last Dose  . sodium chloride 0.9 % injection 10 mL  10 mL Intravenous PRN Patrici Ranks, MD   10 mL at 03/25/15 1530  Allergies as of 06/23/2015 - Review Complete 06/23/2015  Allergen Reaction Noted  . Bee venom Swelling 08/20/2014  . Penicillins Other (See Comments) 08/08/2014  . Latex Swelling and Rash 08/08/2014    Family History  Problem Relation Age of Onset  . Neuropathy Mother   . Hypertension Mother   . Alcoholism Father   . Alcoholism Sister     Social History   Social History  . Marital Status: Single    Spouse Name: N/A  . Number of Children: N/A  . Years of Education: N/A   Social History Main Topics  . Smoking status: Former Smoker -- 0.50 packs/day for 20 years    Types: Cigarettes    Quit date: 06/18/2014  . Smokeless tobacco: Never Used  . Alcohol Use: Yes     Comment: occasional, 09/02/14 no longer using  . Drug Use: No  . Sexual Activity: Not Asked   Other Topics Concern  . None   Social History Narrative    Review of Systems: General: Negative for anorexia, fever, chills. Fatigue improved the past couple days. ENT: Negative for hoarseness, difficulty swallowing. CV: Negative for chest pain, angina, palpitations, peripheral edema.  Respiratory: Negative for dyspnea  at rest, cough, sputum, wheezing.  GI: See history of present illness. Endo: Negative for unusual weight change.  Heme: Negative for bruising or bleeding.   Physical Exam: BP 96/62 mmHg  Pulse 94  Temp(Src) 98.2 F (36.8 C) (Oral)  Ht 5\' 6"  (1.676 m)  Wt 115 lb 9.6 oz (52.436 kg)  BMI 18.67 kg/m2 General:   Alert and oriented. Pleasant and cooperative. Well-nourished and well-developed.  Head:  Normocephalic and atraumatic. Eyes:  Without icterus, sclera clear and conjunctiva pink.  Ears:  Normal auditory acuity. Throat/Neck:  Previous trach site well healed. Cardiovascular:  S1, S2 present without murmurs appreciated. Extremities without clubbing or edema. Respiratory:  Clear to auscultation bilaterally. No wheezes, rales, or rhonchi. No distress.  Gastrointestinal:  +BS, soft, non-tender and non-distended. No HSM noted. No guarding or rebound. No masses appreciated. PEG site clean and dry, no edema, erythema, or drainage noted. Rectal:  Deferred  Neurologic:  Alert and oriented x4;  grossly normal neurologically. Psych:  Alert and cooperative. Normal mood and affect.    06/23/2015 9:31 AM

## 2015-07-19 NOTE — Op Note (Addendum)
[facility] [address] [place], [address]   ENDOSCOPY PROCEDURE REPORT  PATIENT: Shelby Larson, Shelby Larson  MR#: [id number] BIRTHDATE: March 07, 1966 , 49  yrs. old GENDER: female ENDOSCOPIST: R.  Garfield Cornea, MD FACP FACG REFERRED BY:  Judd Lien, M.D. PROCEDURE DATE:  07/19/2015 PROCEDURE:  EGD, diagnostic  followed by gastrostomy tube removal, gastrostomy dilation,  Peg tube replacement and small bowel video capsule placement INDICATIONS:  profound anemia. MEDICATIONS: Deep sedation per Dr.  [facility] ASA CLASS:      Class III  CONSENT: The risks, benefits, limitations, alternatives and imponderables have been discussed.  The potential for biopsy, esophogeal dilation, etc. have also been reviewed.  Questions have been answered.  All parties agreeable.  Please see the history and physical in the medical record for more information.  DESCRIPTION OF PROCEDURE: After the risks benefits and alternatives of the procedure were thoroughly explained, informed consent was obtained.  The [id number]) endoscope was introduced through the mouth and advanced to the second portion of the duodenum , limited by Without limitations. The instrument was slowly withdrawn as the mucosa was fully examined. Estimated blood loss is zero unless otherwise noted in this procedure report.    Slight resistance through the hypopharynx intubating the esophagus but less than expected with ongoing issues.Distal esophageal erosions within 5 mm the GE junction.  No Barrett's epithelium scene.  Indwelling PEG tube  found to be a 14 [place] Cook tube with ae pigtail distal end ball valving  through the pyloric channel. This appeared to be weathered.  It was malpositioned.  There was no external bumper.  The gastric mucosa appear normal.  The pylorus was patent and easily traversed.  Examination of the bulb and second portion revealed no abnormalities.  I decided to go  ahead and change the PEG tube.  I pulled out the indwelling PEG tube came out easily.  I had a 110 French balloon type internal bumper replacement tubne.  it  would not go through the ostomy initially.  Utilizing cervical dilators the ostomy was dilated up to 7 [place] with cervical dilators.  This was done without difficulty and under endoscopic control.  Minimal bleeding.  Subsequently, I placed a 20 French Microvasive angulated replacement PEG tube with a balloon bumper.  It was placed in good position endoscopic control.  Subsequently, I removed the scope and attached the capsule deployment device.  I reintroduced into the stomach and advanced across the pyloric channel and deployed.  Patient tolerated the procedure well.  Retroflexed views revealed no abnormalities.     The scope was then withdrawn from the patient and the procedure completed.  COMPLICATIONS: There were no immediate complications. EBL 10 mL ENDOSCOPIC IMPRESSION: Poorly positioned PEG tube. Status post removal and replacement with dilation of the gastrostomy as described. Mild erosive reflux esophagitis. Otherwise normal-appearing EGD status post placement of a small bowel video capsule.  RECOMMENDATIONS: Begin Prevacid 30 mg daily?" contents of capsule dissolved in 4 ounces of apple juice  per PEG tube daily follow up on small bowel imaging as data becomes available.  REPEAT EXAM:  eSigned:  R. Garfield Cornea, MD Rosalita Chessman [facility] 07/21/2015 12:53 PM Revised: 07/21/2015 12:53 PM  Addendum 07/21/2015  Patient called back to short stay yesterday stating she is having some pain at the PEG site. Was working well, however. Tubing for gravity feeding at home for a 14 French size and not 20. They're having trouble with the connections.  She was asked  to return to short stay. I examined the PEG tube and PEG site external bumper at the 2 cm mark?"a little snug. I loosened it to the 3 cm more site is tender however  there is no evidence of infection.  Nursing staff to determine correct size tubing and patient is also going to be instructed  on the usual care of a PEG tube. PEG site tender likely due to dilation and placement of a larger bore PEG tube 2 days ago.  We will provide prescription for Lortab elixir 7.5 mg/15 mL. Dispense 300 mL..  7.5 mg per PEG tube every 6 hours when necessary pain. No refills.  As a separate issue, small bowel video capsule images are pending.                    CC:  CPT CODES: ICD CODES:  The ICD and CPT codes recommended by this software are interpretations from the data that the clinical staff has captured with the software.  The verification of the translation of this report to the ICD and CPT codes and modifiers is the sole responsibility of the health care institution and practicing physician where this report was generated.  [employer] will not be held responsible for the validity of the ICD and CPT codes included on this report.  AMA assumes no liability for data contained or not contained herein. CPT is a Designer, television/film set of the [employer].  PATIENT NAME:  Shelby Larson, Shelby Larson MR#: [id number]

## 2015-07-19 NOTE — Addendum Note (Signed)
Addendum  created 07/19/15 1245 by Vista Deck, CRNA   Modules edited: Anesthesia Blocks and Procedures, Clinical Notes   Clinical Notes:  File: [id number]

## 2015-07-19 NOTE — Anesthesia Procedure Notes (Signed)
Procedures

## 2015-07-19 NOTE — Anesthesia Postprocedure Evaluation (Signed)
Anesthesia Post Note  Patient: Shelby Larson  Procedure(s) Performed: Procedure(s) (LRB): ESOPHAGOGASTRODUODENOSCOPY (EGD) WITH PROPOFOL (N/A) GIVENS CAPSULE STUDY (N/A) GJ TUBE CHANGE (N/A)  Patient location during evaluation: PACU Anesthesia Type: MAC Level of consciousness: awake and alert Pain management: pain level controlled Vital Signs Assessment: post-procedure vital signs reviewed and stable Respiratory status: spontaneous breathing Cardiovascular status: blood pressure returned to baseline Postop Assessment: no signs of nausea or vomiting Anesthetic complications: no    Last Vitals:  Filed Vitals:   07/19/15 1212 07/19/15 1215  BP:  135/110  Pulse: 83 85  Temp:    Resp: 12 20    Last Pain:  Filed Vitals:   07/19/15 1224  PainSc: Asleep                 Dashanique Brownstein

## 2015-07-19 NOTE — Transfer of Care (Signed)
Immediate Anesthesia Transfer of Care Note  Patient: DOROTHE MITTEN  Procedure(s) Performed: Procedure(s) with comments: ESOPHAGOGASTRODUODENOSCOPY (EGD) WITH PROPOFOL (N/A) - 1015  GIVENS CAPSULE STUDY (N/A) GJ TUBE CHANGE (N/A) - left upper mid abdomen  Patient Location: PACU  Anesthesia Type:MAC  Level of Consciousness: awake  Airway & Oxygen Therapy: Patient Spontanous Breathing and Patient connected to nasal cannula oxygen  Post-op Assessment: Report given to RN  Post vital signs: Reviewed and stable  Last Vitals:  Filed Vitals:   07/19/15 1030 07/19/15 1035  BP: 140/102 140/102  Pulse:    Temp:    Resp: 10 19    Complications: No apparent anesthesia complications

## 2015-07-19 NOTE — Interval H&P Note (Signed)
History and Physical Interval Note:  07/19/2015 10:37 AM  [name]  has presented today for surgery, with the diagnosis of anemia  The various methods of treatment have been discussed with the patient and family. After consideration of risks, benefits and other options for treatment, the patient has consented to  Procedure(s) with comments: ESOPHAGOGASTRODUODENOSCOPY (EGD) WITH PROPOFOL (N/A) - 1015  GIVENS CAPSULE STUDY (N/A) as a surgical intervention .  The patient's history has been reviewed, patient examined, no change in status, stable for surgery.  I have reviewed the patient's chart and labs.  Questions were answered to the patient's satisfaction.     Lawren Sexson  No change. Scope assisted capsule passage may or may not be feasible given the upper GI tract issues relating to her cancer and treatment. We'll plan for diagnostic EGD with capsule placement as feasible/appropriate..  The risks, benefits, limitations, alternatives and imponderables have been reviewed with the patient. Potential for esophageal dilation, biopsy, etc. have also been reviewed.  Questions have been answered. All parties agreeable.

## 2015-07-20 ENCOUNTER — Ambulatory Visit (HOSPITAL_COMMUNITY): Payer: Self-pay | Admitting: Psychiatry

## 2015-07-21 ENCOUNTER — Telehealth: Payer: Self-pay | Admitting: Nurse Practitioner

## 2015-07-21 ENCOUNTER — Telehealth: Payer: Self-pay | Admitting: General Practice

## 2015-07-21 ENCOUNTER — Encounter (HOSPITAL_COMMUNITY): Payer: Self-pay | Admitting: Internal Medicine

## 2015-07-21 DIAGNOSIS — T189XXA Foreign body of alimentary tract, part unspecified, initial encounter: Secondary | ICD-10-CM

## 2015-07-21 NOTE — Telephone Encounter (Signed)
Almyra Free please see note below from Dr. Gala Romney

## 2015-07-21 NOTE — Op Note (Signed)
Small Bowel Givens Capsule Study Procedure date:  07/19/15  Referring Provider:  Dr. Gala Romney PCP:  Dr. [name], MD  Indication for procedure:  Anemia  Patient data:  Wt: 115 lb 9.6 oz (52.44 kg) Ht: 5\' 6"  (1.676 m) Waist: N/A  Findings:  Capsule deployment after endoscopy. Poor study due to fluid and debris. Incomplete study, cannot confirm capsule made it to the cecum. Blood noted in the stomach likely from PEG replacement and tract dilation. Possible stomach erosion vs blood/debris, a couple tiny possible superficial small bowel erosions. No other tumors, gross blood/active bleeding noted, but study is of poor quality which limits findings.  First Gastric image:  00:04:31 First Duodenal image: 00:32:37 First Ileo-Cecal Valve image: N/A First Cecal image: N/A Gastric Passage time: 0h 60m Small Bowel Passage time:  N/A  Summary & Recommendations: 1. Abdominal XRay to confirm passage of capsule 2. Monitor H/H for any changes in anemia 3. Continue to see hematology/oncology 4. Monitor closely for any noted GI bleeding 5. May need referral to tertiary care center if further profound anemia and/or bleeding noted.    Walden Field, AGNP-C Adult & Gerontological Nurse Practitioner [facility]    Attending note:  Pertinent images reviewed. It appears that the capsule did not make it to the cecum during the study. Small bowel mucosa seen without significant abnormality. Abdominal plain film to follow.

## 2015-07-21 NOTE — Telephone Encounter (Signed)
I tried to call the patient, however I was unable to reach her.  Randall Hiss would like for the patient to have an X-ray done to evaluate the passing of the capsule study.  He would like to study done today.

## 2015-07-21 NOTE — Telephone Encounter (Signed)
Please notify the patient that I looked at her capsule study and the viseo ran out before it got to her large intestine. I'm putting in an XRay order to confirm it has fully passed. Please have it done today if possible or tomorrow.

## 2015-07-21 NOTE — Telephone Encounter (Signed)
I tried to call the patient and no answer

## 2015-07-21 NOTE — Telephone Encounter (Signed)
-----   Message from Daneil Dolin, MD sent at 07/21/2015  1:18 PM EST ----- This patient's condition is in evolution. I would like for Shelby Larson to call her tomorrow and see how her PEG tube is doing. Depending on how she is doing, this may change the need for a follow-up the first of the week.  See addendum in procedure note.  I appreciate your help on this patient

## 2015-07-22 ENCOUNTER — Ambulatory Visit (HOSPITAL_COMMUNITY)
Admission: RE | Admit: 2015-07-22 | Discharge: 2015-07-22 | Disposition: A | Discharge status: Home / Self Care | Payer: Medicaid Other | Source: Ambulatory Visit | Attending: Nurse Practitioner | Admitting: Nurse Practitioner

## 2015-07-22 ENCOUNTER — Telehealth: Payer: Self-pay | Admitting: Nurse Practitioner

## 2015-07-22 ENCOUNTER — Other Ambulatory Visit: Payer: Self-pay

## 2015-07-22 ENCOUNTER — Ambulatory Visit (HOSPITAL_COMMUNITY)
Admission: RE | Admit: 2015-07-22 | Discharge: 2015-07-22 | Disposition: A | Discharge status: Home / Self Care | Payer: Medicaid Other | Source: Ambulatory Visit | Attending: Internal Medicine | Admitting: Internal Medicine

## 2015-07-22 DIAGNOSIS — T189XXA Foreign body of alimentary tract, part unspecified, initial encounter: Secondary | ICD-10-CM

## 2015-07-22 DIAGNOSIS — Z431 Encounter for attention to gastrostomy: Secondary | ICD-10-CM

## 2015-07-22 DIAGNOSIS — T184XXD Foreign body in colon, subsequent encounter: Secondary | ICD-10-CM

## 2015-07-22 MED ORDER — LIDOCAINE VISCOUS 2 % MT SOLN
OROMUCOSAL | Status: AC
  Administered 2015-07-22: 15 mL via OROMUCOSAL
  Filled 2015-07-22: qty 15

## 2015-07-22 MED ORDER — LIDOCAINE VISCOUS 2 % MT SOLN
15.0000 mL | Freq: Once | OROMUCOSAL | Status: AC
  Administered 2015-07-22: 15 mL via OROMUCOSAL

## 2015-07-22 MED ORDER — IOHEXOL 300 MG/ML  SOLN
5.0000 mL | Freq: Once | INTRAMUSCULAR | Status: AC | PRN
  Administered 2015-07-22: 5 mL

## 2015-07-22 NOTE — Telephone Encounter (Signed)
Open in error

## 2015-07-22 NOTE — Telephone Encounter (Signed)
Spoke with RMR- he wants EG to call the pt and get all the information (how it came out, how is she doing without it, what the site looks like, ?infection) and talk to her about the capsule study and the xray she needs. Rosendo Gros was unable to reach the pt yesterday about going for the xray.   Randall Hiss, Dr.Rourk wants you to call the pt and then call him with the details.

## 2015-07-22 NOTE — Telephone Encounter (Addendum)
Late entry: At approximately 10:30 am discussed the care with IR. Order for PEG replacement put into Epic. They advised her to come to [facility] IR now and they will work her in for the procedure. Also made them aware of the need for KUB to verify Givens Capsule passage. Patient has been informed and stated she will leave in a few mins to head that way. She will call us with any further problems. She is aware to keep her follow-up appointment to further address anemia issue.

## 2015-07-22 NOTE — Telephone Encounter (Signed)
Called the patient, states she went to plug up her PEG last night around 8 pm, wasn't doing anyting out of the ordinary, and when she looked down it was on the floor. It's her primary source of nutrition, only drinking water, not eating. States the site was red and is painful. She tried to put the PEG back in herself but it hurt so she stopped. Thinks there was some drainage from the site, possibly pus, but not sure. She covered the site with gauze and doesn't want to take it off because it hurts. Not currently bleeding that she can tell.  Informed her of XRay order for capsule passage.  States when it's put back in she needs pain medication because it's sore.

## 2015-07-22 NOTE — Telephone Encounter (Signed)
Called pt and she is aware  ?

## 2015-07-22 NOTE — Telephone Encounter (Signed)
Pt called this morning to inform us that her PEG came out last night at 8:30 pm. She stated that she called the office but we was not here. She did not know what to do. She is not able to eat but she is drinking water. She has not taken any of her medications this morning yet. Please advise

## 2015-07-22 NOTE — Telephone Encounter (Signed)
Please tell the patient that the capsule from her pill endoscopy seems to be either in her intestines. We need to do another XRay on October 05, 2022 to recheck if it has passed. I've put in the order, she can "walk in" to radiology on 10/05/2022 for the simple XRay.

## 2015-07-22 NOTE — Telephone Encounter (Signed)
PEG tube replaced uneventfully by IR. Plain films demonstrate persisting capsule either in the proximal small bowel or descending colon (hopefully, the latter). Would plan to repeat a plain film January 30

## 2015-07-22 NOTE — Telephone Encounter (Signed)
We need to find out how patient is doing; we need to determine if she's having pain as has she had her plain film. May not be able to put another PEG tube and without endoscopy and that may not be the best route to take. She may best go back to interventional radiology and have a smaller PEG tube placed.  Staff here tells me our smallest PEG tube is 20 [place]. Ostomy is likely already very considerably since tube reportedly came out last night.

## 2015-07-22 NOTE — Telephone Encounter (Signed)
Shelby Larson spoke with the pt- see other phone note.

## 2015-07-22 NOTE — Telephone Encounter (Signed)
Appreciate the follow-up. I feel it would be best for interventional radiology to put a smaller bore tube in. This will be better tolerated. This needs to be expedited as discussed with Randall Hiss. Follow-up plain film pending as well.

## 2015-07-23 ENCOUNTER — Other Ambulatory Visit (HOSPITAL_COMMUNITY): Payer: Self-pay | Admitting: Pulmonary Disease

## 2015-07-23 DIAGNOSIS — R1310 Dysphagia, unspecified: Secondary | ICD-10-CM

## 2015-07-26 ENCOUNTER — Encounter: Payer: Self-pay | Admitting: Gastroenterology

## 2015-07-26 ENCOUNTER — Telehealth: Payer: Self-pay | Admitting: Gastroenterology

## 2015-07-26 ENCOUNTER — Ambulatory Visit (HOSPITAL_COMMUNITY)
Admission: RE | Admit: 2015-07-26 | Discharge: 2015-07-26 | Disposition: A | Discharge status: Home / Self Care | Payer: Medicaid Other | Source: Ambulatory Visit | Attending: Nurse Practitioner | Admitting: Nurse Practitioner

## 2015-07-26 ENCOUNTER — Ambulatory Visit: Payer: Self-pay | Admitting: Gastroenterology

## 2015-07-26 DIAGNOSIS — Z931 Gastrostomy status: Secondary | ICD-10-CM | POA: Diagnosis not present

## 2015-07-26 DIAGNOSIS — T184XXD Foreign body in colon, subsequent encounter: Secondary | ICD-10-CM | POA: Diagnosis present

## 2015-07-26 NOTE — Telephone Encounter (Signed)
PATIENT WAS A NO SHOW AND LETTER SENT  °

## 2015-07-28 ENCOUNTER — Encounter (HOSPITAL_COMMUNITY): Payer: Medicaid Other | Attending: Hematology & Oncology

## 2015-07-28 ENCOUNTER — Encounter (HOSPITAL_COMMUNITY): Payer: Self-pay

## 2015-07-28 VITALS — BP 132/55 | HR 88 | Temp 97.5°F | Resp 16

## 2015-07-28 DIAGNOSIS — C01 Malignant neoplasm of base of tongue: Secondary | ICD-10-CM | POA: Insufficient documentation

## 2015-07-28 DIAGNOSIS — R635 Abnormal weight gain: Secondary | ICD-10-CM

## 2015-07-28 DIAGNOSIS — R9389 Abnormal findings on diagnostic imaging of other specified body structures: Secondary | ICD-10-CM

## 2015-07-28 DIAGNOSIS — N189 Chronic kidney disease, unspecified: Secondary | ICD-10-CM

## 2015-07-28 DIAGNOSIS — D631 Anemia in chronic kidney disease: Secondary | ICD-10-CM

## 2015-07-28 DIAGNOSIS — C140 Malignant neoplasm of pharynx, unspecified: Secondary | ICD-10-CM

## 2015-07-28 LAB — CBC WITH DIFFERENTIAL/PLATELET
Basophils Absolute: 0 10*3/uL (ref 0.0–0.1)
Basophils Relative: 0 %
Eosinophils Absolute: 0.1 10*3/uL (ref 0.0–0.7)
Eosinophils Relative: 1 %
HCT: 33.5 % — ABNORMAL LOW (ref 36.0–46.0)
Hemoglobin: 11.3 g/dL — ABNORMAL LOW (ref 12.0–15.0)
Lymphocytes Relative: 9 %
Lymphs Abs: 0.6 10*3/uL — ABNORMAL LOW (ref 0.7–4.0)
MCH: 32.1 pg (ref 26.0–34.0)
MCHC: 33.7 g/dL (ref 30.0–36.0)
MCV: 95.2 fL (ref 78.0–100.0)
Monocytes Absolute: 0.3 10*3/uL (ref 0.1–1.0)
Monocytes Relative: 5 %
Neutro Abs: 5.7 10*3/uL (ref 1.7–7.7)
Neutrophils Relative %: 85 %
Platelets: 262 10*3/uL (ref 150–400)
RBC: 3.52 MIL/uL — ABNORMAL LOW (ref 3.87–5.11)
RDW: 17 % — ABNORMAL HIGH (ref 11.5–15.5)
WBC: 6.7 10*3/uL (ref 4.0–10.5)

## 2015-07-28 LAB — BASIC METABOLIC PANEL
ANION GAP: 10 (ref 5–15)
BUN: 47 mg/dL — AB (ref 6–20)
CHLORIDE: 95 mmol/L — AB (ref 101–111)
CO2: 27 mmol/L (ref 22–32)
Calcium: 9.4 mg/dL (ref 8.9–10.3)
Creatinine, Ser: 1.58 mg/dL — ABNORMAL HIGH (ref 0.44–1.00)
GFR calc Af Amer: 43 mL/min — ABNORMAL LOW (ref >60)
GFR calc non Af Amer: 37 mL/min — ABNORMAL LOW (ref >60)
GLUCOSE: 101 mg/dL — AB (ref 65–99)
Potassium: 4.5 mmol/L (ref 3.5–5.1)
Sodium: 132 mmol/L — ABNORMAL LOW (ref 135–145)

## 2015-07-28 LAB — TSH: TSH: 4.097 u[IU]/mL (ref 0.350–4.500)

## 2015-07-28 LAB — T4, FREE: Free T4: 2.42 ng/dL — ABNORMAL HIGH (ref 0.61–1.12)

## 2015-07-28 MED ORDER — HEPARIN SOD (PORK) LOCK FLUSH 100 UNIT/ML IV SOLN
INTRAVENOUS | Status: AC
  Filled 2015-07-28: qty 5

## 2015-07-28 MED ORDER — SODIUM CHLORIDE 0.9% FLUSH
10.0000 mL | INTRAVENOUS | Status: DC | PRN
  Administered 2015-07-28: 10 mL via INTRAVENOUS
  Filled 2015-07-28: qty 10

## 2015-07-28 MED ORDER — FENTANYL 75 MCG/HR TD PT72: 75.0000 ug | MEDICATED_PATCH | TRANSDERMAL | Status: DC

## 2015-07-28 MED ORDER — HEPARIN SOD (PORK) LOCK FLUSH 100 UNIT/ML IV SOLN
500.0000 [IU] | Freq: Once | INTRAVENOUS | Status: AC
  Administered 2015-07-28: 500 [IU] via INTRAVENOUS

## 2015-07-28 MED ORDER — LORAZEPAM 2 MG PO TABS: 2.0000 mg | ORAL_TABLET | Freq: Four times a day (QID) | ORAL | Status: DC | PRN

## 2015-07-28 MED ORDER — LEVOTHYROXINE SODIUM 25 MCG PO TABS: 25.0000 ug | ORAL_TABLET | Freq: Every day | ORAL | Status: DC

## 2015-07-28 NOTE — Progress Notes (Signed)
Shelby Larson presented for Portacath access and flush. Portacath located right chest wall accessed with  H 20 needle. Good blood return present. Portacath flushed with 29ml NS and 500U/21ml Heparin and needle removed intact. Procedure without incident. Patient tolerated procedure well.    No Aranesp today due to lab result per protocol

## 2015-07-28 NOTE — Progress Notes (Signed)
Aranesp held due to hgb of 11.3

## 2015-07-28 NOTE — Patient Instructions (Signed)
[place] at [facility] Discharge Instructions  RECOMMENDATIONS MADE BY THE CONSULTANT AND ANY TEST RESULTS WILL BE SENT TO YOUR REFERRING PHYSICIAN.  Port flush with labs today No shot today per protocol Wants prescriptions refilled  Thank you for choosing [place] at [facility] to provide your oncology and hematology care.  To afford each patient quality time with our provider, please arrive at least 15 minutes before your scheduled appointment time.   Beginning January 23rd 2017 lab work for the [facility] will be done in the  Main lab at [facility] on 1st floor. If you have a lab appointment with the [facility] please come in thru the  Main Entrance and check in at the main information desk  You need to re-schedule your appointment should you arrive 10 or more minutes late.  We strive to give you quality time with our providers, and arriving late affects you and other patients whose appointments are after yours.  Also, if you no show three or more times for appointments you may be dismissed from the clinic at the providers discretion.     Again, thank you for choosing [facility].  Our hope is that these requests will decrease the amount of time that you wait before being seen by our physicians.       _____________________________________________________________  Should you have questions after your visit to [facility], please contact our office at [phone] between the hours of 8:30 a.m. and 4:30 p.m.  Voicemails left after 4:30 p.m. will not be returned until the following business day.  For prescription refill requests, have your pharmacy contact our office.

## 2015-07-29 ENCOUNTER — Other Ambulatory Visit (HOSPITAL_COMMUNITY): Payer: Self-pay | Admitting: Pulmonary Disease

## 2015-07-29 ENCOUNTER — Ambulatory Visit (HOSPITAL_COMMUNITY)
Admission: RE | Admit: 2015-07-29 | Discharge: 2015-07-29 | Disposition: A | Discharge status: Home / Self Care | Payer: Medicaid Other | Source: Ambulatory Visit | Attending: Pulmonary Disease | Admitting: Pulmonary Disease

## 2015-07-29 ENCOUNTER — Telehealth (HOSPITAL_COMMUNITY): Payer: Self-pay | Admitting: *Deleted

## 2015-07-29 ENCOUNTER — Encounter (HOSPITAL_COMMUNITY): Payer: Self-pay | Admitting: Oncology

## 2015-07-29 ENCOUNTER — Telehealth: Payer: Self-pay | Admitting: Internal Medicine

## 2015-07-29 ENCOUNTER — Ambulatory Visit (HOSPITAL_COMMUNITY): Payer: Medicaid Other | Attending: Pulmonary Disease | Admitting: Speech Pathology

## 2015-07-29 DIAGNOSIS — R1314 Dysphagia, pharyngoesophageal phase: Secondary | ICD-10-CM | POA: Insufficient documentation

## 2015-07-29 DIAGNOSIS — R1310 Dysphagia, unspecified: Secondary | ICD-10-CM | POA: Diagnosis present

## 2015-07-29 NOTE — Telephone Encounter (Signed)
PATIENT RECEIVED LETTER ABOUT MISSING HER APPOINTMENT FOR HER PEG TUBE AND CALLED STATING THAT SHE WAS NOT AWARE THAT SHE HAD AN APPOINTMENT FOR THAT DAY

## 2015-07-29 NOTE — Telephone Encounter (Signed)
Done

## 2015-07-29 NOTE — Therapy (Signed)
[place], [place], [address] Phone: [phone]   Fax:  [phone]  Modified Barium Swallow  Patient Details  Name: Shelby Larson MRN: [id number] Date of Birth: [birth year] No Data Recorded  Encounter Date: 07/29/2015      End of Session - 07/29/15 1929    Visit Number 1   Number of Visits 1   Authorization Type Medicaid   SLP Start Time 1300   SLP Stop Time  1400   SLP Time Calculation (min) 60 min   Activity Tolerance Patient tolerated treatment well      Past Medical History  Diagnosis Date  . Heart murmur     as a child  . Chronic bronchitis ([place])   . Anxiety     panic attacks  . Depression   . Headache     onset a few months ago  . Neuropathy ([place])     had it in both hands  . Arthritis   . Fibromyalgia   . Anemia   . Rosacea   . Hypercholesterolemia   . Asthma   . COPD (chronic obstructive pulmonary disease) ([place])   . Seizures ([place])     takes Gabapentin  (last one in 2010); head trauma caused seizures.  . Malignant neoplasm of base of tongue ([place]) 2016    Base of tongue with neck metastases    Past Surgical History  Procedure Laterality Date  . Wisdom tooth extraction    . Tonsillectomy Left 08/20/2014    Procedure: TONSILLECTOMY;  Surgeon: Ruby Cola, MD;  Location: [facility] OR;  Service: ENT;  Laterality: Left;  . Radical neck dissection Left 08/20/2014    Procedure: RADICAL LEFT NECK DISSECTION ;  Surgeon: Ruby Cola, MD;  Location: [place];  Service: ENT;  Laterality: Left;  . Tracheostomy tube placement N/A 08/20/2014    Procedure: TRACHEOSTOMY - AWAKE;  Surgeon: Ruby Cola, MD;  Location: [place];  Service: ENT;  Laterality: N/A;  . Gastrostomy tube placement    . Multiple extractions with alveoloplasty N/A 09/17/2014    Procedure: Extraction of tooth #'s [phone] with alveoloplasty, mandibular left lingual torus reduction, and gross debridement of remaining teeth.;   Surgeon: Lenn Cal, DDS;  Location: [place];  Service: Oral Surgery;  Laterality: N/A;  . Portacath placement    . Colonoscopy with propofol N/A 04/15/2015    RMR: colonic polyps removed as described above.,  . Polypectomy N/A 04/15/2015    Procedure: POLYPECTOMY;  Surgeon: Daneil Dolin, MD;  Location: [facility] ORS;  Service: Endoscopy;  Laterality: N/A;  . Esophagogastroduodenoscopy (egd) with propofol N/A 07/19/2015    RMR: poorly postitioned PEG tube. Status post removal and replacement of a small bowel video capsule  . Givens capsule study N/A 07/19/2015    Procedure: GIVENS CAPSULE STUDY;  Surgeon: Daneil Dolin, MD;  Location: [facility] END[facility];  Service: Endoscopy;  Laterality: N/A;    There were no vitals filed for this visit.  Visit Diagnosis: Dysphagia, pharyngoesophageal phase      Subjective Assessment - 07/29/15 1915    Subjective "I can swallow salad pretty well."   Special Tests MBSS   Currently in Pain? No/denies           General - 07/29/15 1916    General Information   Date of Onset 08/11/14   HPI Shelby Larson is a 50 y.o. 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 She is being followed by GI for anemia and was found to have PNA in August and December 2016.   Type of Study MBS-Modified Barium Swallow Study   Previous Swallow Assessment MBSS 2/27 and 08/27/2014 D3/thin with PMSV in place; MBSS 11/29/2015   Diet Prior to this Study PEG tube;Regular;Thin liquids   Temperature Spikes Noted No   Respiratory Status Room air   Behavior/Cognition Alert;Cooperative;Pleasant mood   Oral Cavity Assessment Within Functional Limits   Oral Care Completed by SLP No   Oral Cavity - Dentition Missing dentition    Vision Functional for self feeding   Self-Feeding Abilities Able to feed self   Patient Positioning Upright in chair   Baseline Vocal Quality Normal   Volitional Cough Strong   Volitional Swallow Able to elicit   Anatomy Within functional limits   Pharyngeal Secretions Not observed secondary MBS   Reason for Referral Objectively evaluate swallowing function   Respiratory Status Room air   Behavior/Cognition Alert;Cooperative;Pleasant mood   Oral Motor / Sensory Function Impaired   Patient Positioning Upright in chair/Tumbleform            Oral Preparation/Oral Phase - 07/29/15 1919    Oral Preparation/Oral Phase   Oral Phase Impaired   Oral - Solids   Oral - Regular Delayed A-P transit;Weak ligual manipulation   Electrical stimulation - Oral Phase   Was Electrical Stimulation Used No          Pharyngeal Phase - 07/29/15 1919    Pharyngeal - Honey   Pharyngeal- Honey Cup Swallow initiation at vallecula;Reduced pharyngeal peristalsis;Delayed swallow initiation;Pharyngeal residue - valleculae;Lateral channel residue;Reduced airway/laryngeal closure;Penetration/Apiration after swallow   Pharyngeal Material enters airway, remains ABOVE vocal cords then ejected out;Material enters airway, remains ABOVE vocal cords and not ejected out;Material does not enter airway   Pharyngeal - Nectar   Pharyngeal- Nectar Cup Delayed swallow initiation;Swallow initiation at vallecula;Reduced pharyngeal peristalsis;Reduced airway/laryngeal closure;Reduced tongue base retraction;Penetration/Aspiration during swallow;Penetration/Apiration after swallow;Pharyngeal residue - valleculae;Lateral channel residue   Pharyngeal Material does not enter airway;Material enters airway, remains ABOVE vocal cords then ejected out;Material enters airway, remains ABOVE vocal cords and not ejected out   Pharyngeal - Thin   Pharyngeal- Thin Cup Delayed swallow initiation;Swallow initiation at vallecula;Reduced  pharyngeal peristalsis;Reduced airway/laryngeal closure;Reduced tongue base retraction;Penetration/Aspiration before swallow;Penetration/Aspiration during swallow;Penetration/Apiration after swallow;Moderate aspiration;Pharyngeal residue - valleculae;Pharyngeal residue - pyriform;Lateral channel residue;Compensatory strategies attempted (with notebox)  supraglottic, head turn, ineffective   Pharyngeal Material enters airway, passes BELOW cords without attempt by patient to eject out (silent aspiration);Material enters airway, passes BELOW cords and not ejected out despite cough attempt by patient   Pharyngeal- Thin Straw Not tested   Pharyngeal - Solids   Pharyngeal- Puree Swallow initiation at vallecula;Penetration/Aspiration during swallow;Pharyngeal residue - posterior pharnyx   Pharyngeal Material enters airway, remains ABOVE vocal cords then ejected out;Material does not enter airway   Pharyngeal- Mechanical Soft Pharyngeal residue - valleculae;Pharyngeal residue - posterior pharnyx   Pharyngeal- Regular Pharyngeal residue - valleculae;Pharyngeal residue - posterior pharnyx   Pharyngeal- Pill Within functional limits  with NTL   Electrical Stimulation - Pharyngeal Phase   Was Electrical Stimulation Used No          Cricopharyngeal Phase - 07/29/15 1927    Cervical Esophageal Phase   Cervical Esophageal Phase Within functional  limits           SLP Short Term Goals - 01/18/15 1447    SLP SHORT TERM GOAL #1   Title Pt will complete pharyngeal strengthening exercises and OME as assigned with use of written cue 2x/day.   Baseline none   Time 8   Period Weeks   Status On-going   SLP SHORT TERM GOAL #2   Title Pt will consume mechanical soft diet and all liquid consistencies with use of strategies PRN.   Baseline only eating jello and water   Time 8   Period Weeks   Status On-going          SLP Long Term Goals - 01/18/15 1447    SLP LONG TERM GOAL #1   Title Pt will  demonstrate safe and efficient consumption of highest recommended diet with use of strategies.   Baseline only tolerating small sips thin liquid and jello   Time 8   Period Weeks   Status On-going   SLP LONG TERM GOAL #2   Title Pt will be independent with pharyngeal and laryngeal exercises with use of written cue.   Baseline max assist   Time 8   Period Weeks   Status On-going          Plan - 07/29/15 1930    Clinical Impression Statement Shelby Larson presents with mild oral phase and moderate pharyngeal phase dysphagia characterized by weak lingual manipulation with solids, delay in swallow initiation, decreased tongue base retraction and pharyngeal pressure (near epiglottis), and decreased airway closure/protection resulting in aspiration of thin liquids (consistently despite strategies) at times before the swallow (premature spillage), but more consistently after the swallow from vallecular and pyriform residuals. Pt has greater difficulty initiating a second/voluntary swallow which makes if difficult for her to effectively clear residuals before they trickle down to laryngeal vestibule. Pt was shown how to implement breath hold/cough/swallow sequence however this proved ineffective on 3/3 trials due to pt inhaling immediately prior to cough which then increased liquid aspiration. Throat clear was more effective, however still did not prevent or clear aspiration. Pt with delayed cough response to aspiration of thins (greater than 2/3 down anterior tracheal wall), although she states that she can tell whenever she aspirates. Pt assessed with thins, NTL, HTL, puree, regular textures, and 1/2 pill in NTL during the examination. Pt penetrated nectars and honey-thick liquids, but overt aspiration not seen. Pt certainly at risk for aspiration with all liquids due to residuals moving down into laryngeal vestibule, however pt appeared to better sense the NTL residuals and made attempts to clear and  repeat swallow. Pt had greater residuals on the tip of epiglottis and uvulae with thickened liquids and solids (small amount), but again her sensation in this area of her pharynx seemed greater than deeper into larynx and trachea. Pt cued to cough a few times and coughed residuals up into velopharynx which she reported with discomfort.  Recommend self regulated regular textures/mech soft with NECTAR-thickened liquids; Implement Frazier water protocol (free thin water only between meals and after oral care). Also recommend outpatient dysphagia therapy for pt education, swallowing exercises, diet tolerance, and ongoing recommendations. Pt was seen by this SLP over the summer and given pharyngeal strengthening exercises, but she has had poor follow through with completion. Pt's scans in August and December 2016 show evidence of PNA so liquid modification and and swallow therapy is strongly recommended. Pt was educated on where to buy thickener (pharmacy). Will request visits.  Pt now has medicaid.   Speech Therapy Frequency 1x /week   Duration 4 weeks  8 weeks   Treatment/Interventions Aspiration precaution training;Pharyngeal strengthening exercises;Diet toleration management by SLP;Compensatory techniques;Trials of upgraded texture/liquids;Cueing hierarchy;SLP instruction and feedback;Patient/family education  MBSS   Potential to Achieve Goals Fair   Potential Considerations Pain level   SLP Home Exercise Plan Pt will complete HEP as assigned to faciliate carryover of treatment strategies in home environment   Consulted and Agree with Plan of Care Patient            Recommendations/Treatment - 07/29/15 1927    Swallow Evaluation Recommendations   SLP Diet Recommendations Dysphagia 3 (mechanical soft);Nectar   Thickener user Simply thick   Liquid Administration via [employer];No straw   Medication Administration Whole meds with liquid   Supervision Patient able to self feed   Compensations Multiple  dry swallows after each bite/sip;Clear throat intermittently;Clear throat after each swallow   Postural Changes Seated upright at 90 degrees;Remain upright for at least 30 minutes after feeds/meals   SLP Diet Recommendations Dysphagia 3 (Mech soft);Nectar   Compensations Multiple dry swallows after each bite/sip;Clear throat intermittently;Clear throat after each swallow   Liquid Administration via Cup;No straw   Supervision Patient able to self feed          Prognosis - 07/29/15 1929    Prognosis   Prognosis for Safe Diet Advancement Fair   Barriers to Reach Goals Severity of deficits;Time post onset;Behavior   Barriers/Prognosis Comment late effects of radiation therapy   Individuals Consulted   Consulted and Agree with Results and Recommendations Patient   Report Sent to  Referring physician      Problem List Patient Active Problem List   Diagnosis Date Noted  . History of colonic polyps   . Anemia 03/23/2015  . 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 Therapeutic opioid induced constipation 09/16/2014  . 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 Malignant neoplasm of base of tongue ([place]) 09/02/2014  . 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

## 2015-08-11 ENCOUNTER — Encounter (HOSPITAL_COMMUNITY): Payer: Medicaid Other

## 2015-08-11 ENCOUNTER — Encounter (HOSPITAL_COMMUNITY): Payer: Medicaid Other | Attending: Hematology & Oncology

## 2015-08-11 VITALS — BP 124/86 | HR 92 | Temp 98.2°F | Resp 18

## 2015-08-11 DIAGNOSIS — Z87891 Personal history of nicotine dependence: Secondary | ICD-10-CM | POA: Insufficient documentation

## 2015-08-11 DIAGNOSIS — C01 Malignant neoplasm of base of tongue: Secondary | ICD-10-CM | POA: Diagnosis present

## 2015-08-11 DIAGNOSIS — D631 Anemia in chronic kidney disease: Secondary | ICD-10-CM

## 2015-08-11 DIAGNOSIS — D649 Anemia, unspecified: Secondary | ICD-10-CM | POA: Diagnosis not present

## 2015-08-11 DIAGNOSIS — N189 Chronic kidney disease, unspecified: Secondary | ICD-10-CM | POA: Diagnosis present

## 2015-08-11 LAB — BASIC METABOLIC PANEL
Anion gap: 8 (ref 5–15)
BUN: 46 mg/dL — AB (ref 6–20)
CO2: 29 mmol/L (ref 22–32)
CREATININE: 1.39 mg/dL — AB (ref 0.44–1.00)
Calcium: 9.1 mg/dL (ref 8.9–10.3)
Chloride: 95 mmol/L — ABNORMAL LOW (ref 101–111)
GFR calc Af Amer: 51 mL/min — ABNORMAL LOW (ref >60)
GFR calc non Af Amer: 44 mL/min — ABNORMAL LOW (ref >60)
Glucose, Bld: 81 mg/dL (ref 65–99)
Potassium: 4.6 mmol/L (ref 3.5–5.1)
SODIUM: 132 mmol/L — AB (ref 135–145)

## 2015-08-11 LAB — CBC WITH DIFFERENTIAL/PLATELET
Basophils Absolute: 0 10*3/uL (ref 0.0–0.1)
Basophils Relative: 0 %
EOS ABS: 0.1 10*3/uL (ref 0.0–0.7)
Eosinophils Relative: 1 %
HCT: 31.2 % — ABNORMAL LOW (ref 36.0–46.0)
HEMOGLOBIN: 10.9 g/dL — AB (ref 12.0–15.0)
LYMPHS ABS: 0.7 10*3/uL (ref 0.7–4.0)
Lymphocytes Relative: 8 %
MCH: 32.9 pg (ref 26.0–34.0)
MCHC: 34.9 g/dL (ref 30.0–36.0)
MCV: 94.3 fL (ref 78.0–100.0)
MONO ABS: 0.8 10*3/uL (ref 0.1–1.0)
MONOS PCT: 9 %
NEUTROS PCT: 82 %
Neutro Abs: 7.5 10*3/uL (ref 1.7–7.7)
Platelets: 309 10*3/uL (ref 150–400)
RBC: 3.31 MIL/uL — ABNORMAL LOW (ref 3.87–5.11)
RDW: 15.4 % (ref 11.5–15.5)
WBC: 9.1 10*3/uL (ref 4.0–10.5)

## 2015-08-11 LAB — FERRITIN: FERRITIN: 264 ng/mL (ref 11–307)

## 2015-08-11 MED ORDER — HEPARIN SOD (PORK) LOCK FLUSH 100 UNIT/ML IV SOLN
INTRAVENOUS | Status: AC
  Filled 2015-08-11: qty 5

## 2015-08-11 MED ORDER — DARBEPOETIN ALFA 60 MCG/0.3ML IJ SOSY
PREFILLED_SYRINGE | INTRAMUSCULAR | Status: AC
  Filled 2015-08-11: qty 0.3

## 2015-08-11 MED ORDER — DARBEPOETIN ALFA 60 MCG/0.3ML IJ SOSY
60.0000 ug | PREFILLED_SYRINGE | Freq: Once | INTRAMUSCULAR | Status: AC
  Administered 2015-08-11: 60 ug via SUBCUTANEOUS

## 2015-08-11 NOTE — Patient Instructions (Signed)
[facility] at [facility] Discharge Instructions  RECOMMENDATIONS MADE BY THE CONSULTANT AND ANY TEST RESULTS WILL BE SENT TO YOUR REFERRING PHYSICIAN.  Aranesp today.   Please return as scheduled.    Thank you for choosing [facility] at [facility] to provide your oncology and hematology care.  To afford each patient quality time with our provider, please arrive at least 15 minutes before your scheduled appointment time.   Beginning January 23rd 2017 lab work for the Cancer Center will be done in the  Main lab at [facility] on 1st floor. If you have a lab appointment with the Cancer Center please come in thru the  Main Entrance and check in at the main information desk  You need to re-schedule your appointment should you arrive 10 or more minutes late.  We strive to give you quality time with our providers, and arriving late affects you and other patients whose appointments are after yours.  Also, if you no show three or more times for appointments you may be dismissed from the clinic at the providers discretion.     Again, thank you for choosing [facility].  Our hope is that these requests will decrease the amount of time that you wait before being seen by our physicians.       _____________________________________________________________  Should you have questions after your visit to [facility], please contact our office at [phone] between the hours of 8:30 a.m. and 4:30 p.m.  Voicemails left after 4:30 p.m. will not be returned until the following business day.  For prescription refill requests, have your pharmacy contact our office.     

## 2015-08-11 NOTE — Progress Notes (Signed)
Shelby Larson presents today for injection per MD orders. Neupogen 92mcg administered SQ in right Abdomen. Administration without incident. Patient tolerated well.

## 2015-08-11 NOTE — Progress Notes (Signed)
Please see other encounter for documentation.   

## 2015-08-17 ENCOUNTER — Encounter (HOSPITAL_COMMUNITY): Payer: Self-pay | Admitting: Psychiatry

## 2015-08-17 ENCOUNTER — Telehealth (HOSPITAL_COMMUNITY): Payer: Self-pay | Admitting: *Deleted

## 2015-08-17 ENCOUNTER — Ambulatory Visit (HOSPITAL_COMMUNITY): Payer: Medicaid Other | Admitting: Psychiatry

## 2015-08-17 NOTE — Telephone Encounter (Signed)
Pt came into office for a new pt appt. Pt did not stay for appt due to her stating her ride was here to pick her up. Provider stated to resch appt for pt and she stated that she can not sch appt at this time due to not having her Calendar and basically the entire month of March is full of appts and will have to call office back. Appt card with number was given to pt and she showed understanding and left.

## 2015-08-18 ENCOUNTER — Ambulatory Visit (HOSPITAL_COMMUNITY): Payer: Medicaid Other | Admitting: Speech Pathology

## 2015-08-18 DIAGNOSIS — R1314 Dysphagia, pharyngoesophageal phase: Secondary | ICD-10-CM | POA: Diagnosis not present

## 2015-08-18 DIAGNOSIS — R1310 Dysphagia, unspecified: Secondary | ICD-10-CM

## 2015-08-18 NOTE — Therapy (Signed)
[place], [place], [address] Phone: [phone]   Fax:  [phone]  Speech Language Pathology Treatment  Patient Details  Name: Shelby Larson MRN: [id number] Date of Birth: 1965-10-06 No Data Recorded  Encounter Date: 08/18/2015      End of Session - 08/18/15 1203    Visit Number 2   Number of Visits 4   Authorization Type Medicaid   Authorization Time Period 08/18/2015-09/14/2015   Authorization - Visit Number 1   Authorization - Number of Visits 3   SLP Start Time [phone]   SLP Stop Time  1021   SLP Time Calculation (min) 38 min   Activity Tolerance Patient tolerated treatment well      Past Medical History  Diagnosis Date  . Heart murmur     as a child  . Chronic bronchitis ([place])   . Anxiety     panic attacks  . Depression   . Headache     onset a few months ago  . Neuropathy ([place])     had it in both hands  . Arthritis   . Fibromyalgia   . Anemia   . Rosacea   . Hypercholesterolemia   . Asthma   . COPD (chronic obstructive pulmonary disease) ([place])   . Seizures ([place])     takes Gabapentin  (last one in 2010); head trauma caused seizures.  . Malignant neoplasm of base of tongue ([place]) 2016    Base of tongue with neck metastases    Past Surgical History  Procedure Laterality Date  . Wisdom tooth extraction    . Tonsillectomy Left 08/20/2014    Procedure: TONSILLECTOMY;  Surgeon: Ruby Cola, MD;  Location: [facility] OR;  Service: ENT;  Laterality: Left;  . Radical neck dissection Left 08/20/2014    Procedure: RADICAL LEFT NECK DISSECTION ;  Surgeon: Ruby Cola, MD;  Location: [place];  Service: ENT;  Laterality: Left;  . Tracheostomy tube placement N/A 08/20/2014    Procedure: TRACHEOSTOMY - AWAKE;  Surgeon: Ruby Cola, MD;  Location: [place];  Service: ENT;  Laterality: N/A;  . Gastrostomy tube placement    . Multiple extractions with alveoloplasty N/A 09/17/2014    Procedure: Extraction of  tooth #'s [phone] with alveoloplasty, mandibular left lingual torus reduction, and gross debridement of remaining teeth.;  Surgeon: Lenn Cal, DDS;  Location: [place];  Service: Oral Surgery;  Laterality: N/A;  . Portacath placement    . Colonoscopy with propofol N/A 04/15/2015    RMR: colonic polyps removed as described above.,  . Polypectomy N/A 04/15/2015    Procedure: POLYPECTOMY;  Surgeon: Daneil Dolin, MD;  [facility];  Service: Endoscopy;  Laterality: N/A;  . Esophagogastroduodenoscopy (egd) with propofol N/A 07/19/2015    RMR: poorly postitioned PEG tube. Status post removal and replacement of a small bowel video capsule  . Givens capsule study N/A 07/19/2015    Procedure: GIVENS CAPSULE STUDY;  Surgeon: Daneil Dolin, MD;  [facility];  Service: Endoscopy;  Laterality: N/A;    There were no vitals filed for this visit.  Visit Diagnosis: Dysphagia      Subjective Assessment - 08/18/15 1151    Subjective "I still have a lot of phlegm that comes up."   Currently in Pain? No/denies               ADULT SLP TREATMENT - 08/18/15 0001    General Information   Behavior/Cognition Alert;Cooperative;Pleasant  mood   Patient Positioning Upright in chair   Oral care provided N/A   HPI Shelby Larson is a 50 y.o. 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 She is being followed by GI for anemia and was found to have PNA in August and December 2016.   Treatment Provided   Treatment provided Dysphagia   Dysphagia Treatment   Temperature Spikes Noted No   Respiratory Status Room air   Treatment Methods Skilled observation;Therapeutic exercise;Compensation strategy training;Patient/caregiver  education   Patient observed directly with PO's Yes   Type of PO's observed Thin liquids;Nectar-thick liquids   Feeding Able to feed self   Liquids provided via Cup   Pharyngeal Phase Signs & Symptoms Multiple swallows;Delayed throat clear;Complaints of residue   Type of cueing Verbal;Visual   Amount of cueing Moderate   Pain Assessment   Pain Assessment No/denies pain   Assessment / Recommendations / Plan   Plan Continue with current plan of care   Dysphagia Recommendations   Diet recommendations Dysphagia 3 (mechanical soft);Nectar-thick liquid  thin water after oral care   Liquids provided via Cup   Medication Administration Crushed with puree   Supervision Patient able to self feed   Compensations Small sips/bites;Clear throat after each swallow;Multiple dry swallows after each bite/sip   Postural Changes and/or Swallow Maneuvers Out of bed for meals;Seated upright 90 degrees;Upright 30-60 min after meal   General Recommendations   Oral Care Recommendations Oral care before and after PO;Patient independent with oral care   Progression Toward Goals   Progression toward goals Progressing toward goals          SLP Education - 08/18/15 1202    Education provided Yes   Education Details Ongoing recommendations regarding results of MBSS   Person(s) Educated Patient   Methods Explanation;Demonstration;Handout   Comprehension Verbalized understanding          SLP Short Term Goals - 08/18/15 1232    SLP SHORT TERM GOAL #1   Title Pt will complete pharyngeal strengthening exercises and OME as assigned with use of written cue 2x/day.   Baseline none   Time 3   Period Weeks   Status On-going   SLP SHORT TERM GOAL #2   Title Pt will consume mechanical soft diet and all liquid consistencies with use of strategies PRN.   Baseline will not thicken liquids   Time 3   Period Weeks   Status On-going          SLP Long Term Goals - 08/18/15 1233    SLP LONG TERM GOAL #1    Title Pt will demonstrate safe and efficient consumption of highest recommended diet with use of strategies.   Baseline Minimal po consumption   Time 3   Period Weeks   Status On-going          Plan - 08/18/15 1204    Clinical Impression Statement Shelby Larson was approved for a total of 3 treatment sessions. She reports that she did not go purchase food thickener from pharmacy and states that she "will not put it in water". SLP showed pt video from her MBSS to show her the aspiration that is occuring after the swallow. Pt aspirating trace amounts of thin liquids generally after the swallow from pooling  in pyriforms that spill into laryngeal vestibule. Pt advised to thicken all liquids to nectar-consistency and she was given sample gel packets to try at home. She reports that she is unlikely to use despite risks for aspiration. Pt advised to swallow once, give short cough, and immediately swallow again if she isn't going to thicken her liquids (still at risk for aspiration). She required mod cueing from SLP to implement during trials today. Pt stated that she is using her PEG at night for tube feeds at a rate of 190/hour (was advised 75 earlier per pt). She also does not have the head of her bed elevated- she "sleeps on two pillows". SLP strongly encouraged pt to purchase a wedge or better yet, bed risers for HOB to 30 degrees. SLP verbalized concerns about refluxing tube feeds as biggest risk for aspiration. SLP encouraged pt to keep a food journal and she quickly stated, "That is not going to happen". Pt asked if her throat would "heal up" so that she would no longer aspirate and SLP educated pt on tissue changes s/p radiation therapy and need to continue swallowing exercises for the rest of her life. SLP stated that swallowing would likely never return to baseline, but that I suspected that she would be able to compensate quite well, but it would require determination on her part (to eat more and  continue with exercises). Pt appears most bothered by excess tissue under chin and asked whether massage would help. SLP encouraged pt to continue exercises and gently push downward on tissue during self massage. Will see pt next week and continue plan of care for 2 more visits.   Speech Therapy Frequency 1x /week   Duration 2 weeks  8 weeks   Treatment/Interventions Aspiration precaution training;Pharyngeal strengthening exercises;Diet toleration management by SLP;Compensatory techniques;Trials of upgraded texture/liquids;Cueing hierarchy;SLP instruction and feedback;Patient/family education  MBSS   Potential to Achieve Goals Fair   Potential Considerations Pain level   SLP Home Exercise Plan Pt will complete HEP as assigned to faciliate carryover of treatment strategies in home environment   Consulted and Agree with Plan of Care Patient        Problem List Patient Active Problem List   Diagnosis Date Noted  . History of colonic polyps   . Anemia 03/23/2015  . 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 Therapeutic opioid induced constipation 09/16/2014  . 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 Malignant neoplasm of base of tongue ([place]) 09/02/2014  . Malignant neoplasm of pharynx [facility]) 08/20/2014   Thank you,  Genene Churn, [place]  [facility] 08/18/2015, 12:34 PM  [place] [address] [place], [place], [address] Phone: [phone]   Fax:  [phone]   Name: Shelby Larson MRN: [id number] Date of Birth: [birth year]

## 2015-08-24 ENCOUNTER — Ambulatory Visit: Payer: Self-pay | Admitting: Nurse Practitioner

## 2015-08-24 ENCOUNTER — Encounter (HOSPITAL_COMMUNITY): Payer: Self-pay | Admitting: Speech Pathology

## 2015-08-25 ENCOUNTER — Encounter (HOSPITAL_COMMUNITY): Payer: Medicaid Other | Attending: Hematology & Oncology

## 2015-08-25 DIAGNOSIS — D631 Anemia in chronic kidney disease: Secondary | ICD-10-CM | POA: Insufficient documentation

## 2015-08-25 DIAGNOSIS — Z95828 Presence of other vascular implants and grafts: Secondary | ICD-10-CM

## 2015-08-25 DIAGNOSIS — N189 Chronic kidney disease, unspecified: Secondary | ICD-10-CM | POA: Diagnosis present

## 2015-08-25 DIAGNOSIS — C01 Malignant neoplasm of base of tongue: Secondary | ICD-10-CM | POA: Insufficient documentation

## 2015-08-25 LAB — CBC WITH DIFFERENTIAL/PLATELET
Basophils Absolute: 0 10*3/uL (ref 0.0–0.1)
Basophils Relative: 0 %
Eosinophils Absolute: 0.1 10*3/uL (ref 0.0–0.7)
Eosinophils Relative: 1 %
HEMATOCRIT: 32.7 % — AB (ref 36.0–46.0)
HEMOGLOBIN: 11.3 g/dL — AB (ref 12.0–15.0)
LYMPHS ABS: 0.5 10*3/uL — AB (ref 0.7–4.0)
LYMPHS PCT: 6 %
MCH: 33 pg (ref 26.0–34.0)
MCHC: 34.6 g/dL (ref 30.0–36.0)
MCV: 95.6 fL (ref 78.0–100.0)
MONO ABS: 0.8 10*3/uL (ref 0.1–1.0)
MONOS PCT: 9 %
NEUTROS ABS: 6.8 10*3/uL (ref 1.7–7.7)
NEUTROS PCT: 84 %
Platelets: 210 10*3/uL (ref 150–400)
RBC: 3.42 MIL/uL — ABNORMAL LOW (ref 3.87–5.11)
RDW: 15 % (ref 11.5–15.5)
WBC: 8 10*3/uL (ref 4.0–10.5)

## 2015-08-25 LAB — BASIC METABOLIC PANEL
Anion gap: 8 (ref 5–15)
BUN: 24 mg/dL — ABNORMAL HIGH (ref 6–20)
CALCIUM: 9.4 mg/dL (ref 8.9–10.3)
CHLORIDE: 93 mmol/L — AB (ref 101–111)
CO2: 28 mmol/L (ref 22–32)
CREATININE: 1.52 mg/dL — AB (ref 0.44–1.00)
GFR calc Af Amer: 45 mL/min — ABNORMAL LOW (ref >60)
GFR calc non Af Amer: 39 mL/min — ABNORMAL LOW (ref >60)
GLUCOSE: 92 mg/dL (ref 65–99)
Potassium: 4.1 mmol/L (ref 3.5–5.1)
Sodium: 129 mmol/L — ABNORMAL LOW (ref 135–145)

## 2015-08-25 MED ORDER — SODIUM CHLORIDE 0.9% FLUSH
10.0000 mL | Freq: Once | INTRAVENOUS | Status: AC
  Administered 2015-08-25: 10 mL via INTRAVENOUS

## 2015-08-25 MED ORDER — HEPARIN SOD (PORK) LOCK FLUSH 100 UNIT/ML IV SOLN
500.0000 [IU] | Freq: Once | INTRAVENOUS | Status: AC
  Administered 2015-08-25: 500 [IU] via INTRAVENOUS

## 2015-08-25 MED ORDER — HEPARIN SOD (PORK) LOCK FLUSH 100 UNIT/ML IV SOLN
INTRAVENOUS | Status: AC
  Filled 2015-08-25: qty 5

## 2015-08-25 NOTE — Progress Notes (Signed)
Shelby Larson presented for Portacath access and flush. Proper placement of portacath confirmed by CXR. Portacath located right chest wall accessed with  H 20 needle. Good blood return present. Portacath flushed with 18m NS and 500U/529mHeparin and needle removed intact. Procedure without incident. Patient tolerated procedure well.  Hemoglobin 11.3 today. Aranesp not given,treatment parameters not met.  Patient reports she finally got her fentanyl patches approved and restarted them on Saturday. Complains of itching all over since then even waking up in her sleep itching. Denies any changes in any other medications or changes with soaps, lotions, detergents, etc. However has not removed fentanyl patch because of itching. Checked skin, did not notice any rash anywhere including her back where she said her mother told her she had a rash. Did note a few small scabbed areas where she had scratched over. Reported complaints to Dr.Penland. Instructions given to patient per Dr.Penland. MD would not advise changing anything just yet, continue to monitor "itching" and if she develops any rash call MD. Advised she may use OTC po Benadryl or topical benadryl cream to areas that itch.

## 2015-08-25 NOTE — Patient Instructions (Signed)
[place] at [facility] Discharge Instructions  RECOMMENDATIONS MADE BY THE CONSULTANT AND ANY TEST RESULTS WILL BE SENT TO YOUR REFERRING PHYSICIAN.  Hemoglobin 11.3. Return as scheduled for lab work +/- aranesp. Monitor itching, if you develop any rash call MD. Dennis Bast may take over the counter benadryl as needed for itching or use topical benadryl cream. If symptoms persist call MD.  Thank you for choosing [place] at [facility] to provide your oncology and hematology care.  To afford each patient quality time with our provider, please arrive at least 15 minutes before your scheduled appointment time.   Beginning January 23rd 2017 lab work for the [facility] will be done in the  Main lab at [facility] on 1st floor. If you have a lab appointment with the [facility] please come in thru the  Main Entrance and check in at the main information desk  You need to re-schedule your appointment should you arrive 10 or more minutes late.  We strive to give you quality time with our providers, and arriving late affects you and other patients whose appointments are after yours.  Also, if you no show three or more times for appointments you may be dismissed from the clinic at the providers discretion.     Again, thank you for choosing [facility]  Our hope is that these requests will decrease the amount of time that you wait before being seen by our physicians.       _____________________________________________________________  Should you have questions after your visit to [facility], please contact our office at [phone] between the hours of 8:30 a.m. and 4:30 p.m.  Voicemails left after 4:30 p.m. will not be returned until the following business day.  For prescription refill requests, have your pharmacy contact our office.

## 2015-08-26 ENCOUNTER — Encounter: Payer: Self-pay | Admitting: Nurse Practitioner

## 2015-08-26 ENCOUNTER — Ambulatory Visit (INDEPENDENT_AMBULATORY_CARE_PROVIDER_SITE_OTHER): Payer: Medicaid Other | Admitting: Nurse Practitioner

## 2015-08-26 VITALS — BP 114/76 | HR 113 | Temp 98.9°F | Ht 66.0 in | Wt 109.2 lb

## 2015-08-26 DIAGNOSIS — D631 Anemia in chronic kidney disease: Secondary | ICD-10-CM

## 2015-08-26 DIAGNOSIS — N189 Chronic kidney disease, unspecified: Secondary | ICD-10-CM

## 2015-08-26 NOTE — Assessment & Plan Note (Signed)
Patient with continued anemia currently actively managed by hematology/oncology. She's had a complete GI workup including colonoscopy, upper endoscopy, and capsule study without significant findings to explain her level of anemia. Deemed likely multifactorial anemia in nature with cancer, chemotherapy effects, and subsequent chronic renal disease with a creatinine in the 2-2.5 range. Continues receiving epopoetin and transfusions as necessary. Her anemia is doing better with the last 3 CBCs with a hemoglobin in the 10-11.5 range. Last CBC completed yesterday. No need for lab draws at this time. No noted overt GI bleeding. At this time we will have her follow-up in 6 months, otherwise GI evaluation complete.

## 2015-08-26 NOTE — Progress Notes (Signed)
Referring Provider: [name], MD Primary Care Physician:  [name], MD Primary GI:  Dr. Gala Romney  Chief Complaint  Patient presents with  . Follow-up    HPI:   Shelby Larson is a 50 y.o. female who presents for follow-up on anemia. She was last seen in our office on 06/23/2015 for the same. At that time she stated she was feeling a bit better, had recently had her trach removed, and continues on epopoetin at the increased dose. Heme stool cards were negative 3, no overt GI bleeding noted. She is status post chemotherapy and radiation for base of tongue and pharyngeal cancer. At the time of her last visit she was still reliant on PEG tube for supplemental nutrition. Postchemotherapy acute kidney injury with ongoing chronic renal disease with creatinine 2.46. Anemia deemed likely multifactorial with cancer, chemotherapy, chemotherapy induced renal disease. Recent colonoscopy was negative. She was referred for endoscopy and capsule study essentially to wrap up her GI evaluation. EGD completed 07/19/2015 found poorly positioned PEG tube which also appeared weathered, status post removal and replacement with dilation of the gastrostomy, mild erosive reflux esophagitis, otherwise normal EGD and post placement of a bowel video capsule. Recommended Prevacid daily dissolved into apple juice and administered per PEG tube. The patient called on 07/21/2015 stating her PEG tube had come out and she was arranged for replacement at interventional radiology. Plain films x-ray was taken due to incomplete capsule study which found capsule retained either in the small bowel or descending colon. Follow-up x-ray determined capsule had passed a few days later.   Results of capsule study include poor study due to fluid and debris, incomplete study, blood noted in the stomach likely from PEG replacement intractable dilation, possible stomach erosion versus blood/debris, a couple tiny possible  superficial small bowel erosions, otherwise normal. Recommend monitor H/H, continue to see hematology/oncology, monitor for any noted GI bleeding. If further profound anemia or bleeding noted likely need referral to tertiary care center.  Today she states she's doing well. She's still seeing hematology/oncology and blood/epogen as needed. Last time she was sent home without any treatment because H/H was over 11. She has dry mouth and will ask hematology/oncology about it. Also has some itching on her arms and lower back and is taking Benadryl to help. Appetite "not good." PEG is 85-90% of her intake. Does milkshakes to help with po nutrition and has put on some weight from them. Denies hematochezia, melena. Has some pain/irritation at the PEG bumper site. Occasional nausea, medication helps. No sudden changes in bowel movements. Denies chest pain, dyspnea, dizziness, lightheadedness, syncope, near syncope. Denies any other upper or lower GI symptoms.  Past Medical History  Diagnosis Date  . Heart murmur     as a child  . Chronic bronchitis ([place])   . Anxiety     panic attacks  . Depression   . Headache     onset a few months ago  . Neuropathy ([place])     had it in both hands  . Arthritis   . Fibromyalgia   . Anemia   . Rosacea   . Hypercholesterolemia   . Asthma   . COPD (chronic obstructive pulmonary disease) ([place])   . Seizures ([place])     takes Gabapentin  (last one in 2010); head trauma caused seizures.  . Malignant neoplasm of base of tongue ([place]) 2016    Base of tongue with neck metastases    Past Surgical History  Procedure Laterality Date  . Wisdom tooth extraction    . Tonsillectomy Left 08/20/2014    Procedure: TONSILLECTOMY;  Surgeon: Ruby Cola, MD;  [facility];  Service: ENT;  Laterality: Left;  . Radical neck dissection Left 08/20/2014    Procedure: RADICAL LEFT NECK DISSECTION ;  Surgeon: Ruby Cola, MD;  Location: [place];  Service: ENT;  Laterality: Left;  .  Tracheostomy tube placement N/A 08/20/2014    Procedure: TRACHEOSTOMY - AWAKE;  Surgeon: Ruby Cola, MD;  Location: [place];  Service: ENT;  Laterality: N/A;  . Gastrostomy tube placement    . Multiple extractions with alveoloplasty N/A 09/17/2014    Procedure: Extraction of tooth #'s [phone] with alveoloplasty, mandibular left lingual torus reduction, and gross debridement of remaining teeth.;  Surgeon: Lenn Cal, DDS;  Location: [place];  Service: Oral Surgery;  Laterality: N/A;  . Portacath placement    . Colonoscopy with propofol N/A 04/15/2015    RMR: colonic polyps removed as described above.,  . Polypectomy N/A 04/15/2015    Procedure: POLYPECTOMY;  Surgeon: Daneil Dolin, MD;  [facility];  Service: Endoscopy;  Laterality: N/A;  . Esophagogastroduodenoscopy (egd) with propofol N/A 07/19/2015    RMR: poorly postitioned PEG tube. Status post removal and replacement of a small bowel video capsule  . Givens capsule study N/A 07/19/2015    Procedure: GIVENS CAPSULE STUDY;  Surgeon: Daneil Dolin, MD;  [facility];  Service: Endoscopy;  Laterality: N/A;    Current Outpatient Prescriptions  Medication Sig Dispense Refill  . BIOTIN 5000 PO Take 10,000 mg by mouth daily.    . cyclobenzaprine (FLEXERIL) 10 MG tablet Take 1 tablet (10 mg total) by mouth 3 (three) times daily as needed for muscle spasms. 30 tablet 1  . DULoxetine (CYMBALTA) 30 MG capsule Take by mouth. Taking 2 tablets in AM    . fentaNYL (DURAGESIC - DOSED MCG/HR) 75 MCG/HR Place 1 patch (75 mcg total) onto the skin every 3 (three) days. 10 patch 0  . fluticasone (FLONASE) 50 MCG/ACT nasal spray Place 2 sprays into both nostrils daily. (Patient taking differently: Place 2 sprays into both nostrils daily as needed. ) 16 g 3  . gabapentin (NEURONTIN) 600 MG tablet Take 600 mg by mouth 3 (three) times daily.    Marland Kitchen levothyroxine (LEVOTHROID) 25 MCG tablet Take 1 tablet (25 mcg total) by mouth daily before  breakfast. 30 tablet 1  . LORazepam (ATIVAN) 2 MG tablet Take 1 tablet (2 mg total) by mouth every 6 (six) hours as needed for anxiety. 30 tablet 1  . Nutritional Supplements (FEEDING SUPPLEMENT, JEVITY 1.5 CAL/FIBER,) LIQD 5 cans. Nocturnal feed. Rate100 ml/hr x 12 hours. Provides. 1778 kcals, 76 g pro, 900 mls fluid.  Flush 180 mls 5x a day.    . prochlorperazine (COMPAZINE) 10 MG tablet Take 1 tablet (10 mg total) by mouth every 6 (six) hours as needed for nausea or vomiting. 60 tablet 2  . QUEtiapine (SEROQUEL) 50 MG tablet Take 1 tablet (50 mg total) by mouth daily as needed (sleep). 30 tablet 1  . sodium fluoride (PREVIDENT 5000 PLUS) 1.1 % CREA dental cream Apply to tooth brush. Brush teeth for 2 minutes. Spit out excess-DO NOT swallow. Repeat nightly. 1 Tube prn  . tetrahydrozoline-zinc (VISINE-AC) 0.05-0.25 % ophthalmic solution Place 2 drops into both eyes daily as needed (dry eyes).    Marland Kitchen HYDROcodone-acetaminophen (NORCO) 10-325 MG tablet Take 1 tablet by mouth every 6 (  six) hours as needed. (Patient not taking: Reported on 08/26/2015) 60 tablet 0   No current facility-administered medications for this visit.   Facility-Administered Medications Ordered in Other Visits  Medication Dose Route Frequency Provider Last Rate Last Dose  . sodium chloride 0.9 % injection 10 mL  10 mL Intravenous PRN Patrici Ranks, MD   10 mL at 03/25/15 1530  . sodium chloride 0.9 % injection 10 mL  10 mL Intravenous PRN Baird Cancer, PA-C   10 mL at 07/14/15 1200    Allergies as of 08/26/2015 - Review Complete 08/26/2015  Allergen Reaction Noted  . Bee venom Swelling 08/20/2014  . Penicillins Other (See Comments) 08/08/2014  . Latex Swelling and Rash 08/08/2014    Family History  Problem Relation Age of Onset  . Neuropathy Mother   . Hypertension Mother   . Alcoholism Father   . Alcoholism Sister     Social History   Social History  . Marital Status: Single    Spouse Name: N/A  . Number  of Children: N/A  . Years of Education: N/A   Social History Main Topics  . Smoking status: Current Every Day Smoker -- 0.50 packs/day for 20 years    Types: Cigarettes    Last Attempt to Quit: 06/18/2014  . Smokeless tobacco: Never Used     Comment: Per pt she restarted 2016 as of 08-17-15  . Alcohol Use: 0.0 oz/week    0 Standard drinks or equivalent per week     Comment: occasional, 09/02/14 no longer using, per pt no 08-17-15  . Drug Use: No     Comment: per pt no 08-17-15  . Sexual Activity: Not Asked   Other Topics Concern  . None   Social History Narrative    Review of Systems: General: Negative for anorexia, weight loss. CV: Negative for chest pain, angina, palpitations, peripheral edema.  Respiratory: Negative for dyspnea at rest, cough, sputum, wheezing.  GI: See history of present illness. Heme: Negative for bruising or bleeding.   Physical Exam: BP 114/76 mmHg  Pulse 113  Temp(Src) 98.9 F (37.2 C)  Ht 5\' 6"  (1.676 m)  Wt 109 lb 3.2 oz (49.533 kg)  BMI 17.63 kg/m2  LMP 07/15/2014 General:   Alert and oriented. Pleasant and cooperative. Thin female, well-nourished and well-developed.  Head:  Normocephalic and atraumatic. Eyes:  Without icterus, sclera clear and conjunctiva pink.  Ears:  Normal auditory acuity. Cardiovascular:  S1, S2 present without murmurs appreciated. Extremities without clubbing or edema. Respiratory:  Clear to auscultation bilaterally. No wheezes, rales, or rhonchi. No distress.  Gastrointestinal:  +BS, soft, and non-distended. PEG site clean, dry, intact, with mild area tenderness to palpation. No guarding or rebound.  Rectal:  Deferred  Musculoskalatal:  Symmetrical without gross deformities. Neurologic:  Alert and oriented x4;  grossly normal neurologically. Psych:  Alert and cooperative. Normal mood and affect.    08/26/2015 8:40 AM   Disclaimer: This note was dictated with voice recognition software. Similar sounding words can  inadvertently be transcribed and may not be corrected upon review.

## 2015-08-26 NOTE — Progress Notes (Signed)
cc'ed to pcp °

## 2015-08-26 NOTE — Patient Instructions (Addendum)
1. Continue seeing hematology/oncology. 2. Return for follow-up in 6 months. 3. Call us if you need [place] sooner than that.

## 2015-08-31 ENCOUNTER — Ambulatory Visit (HOSPITAL_COMMUNITY)
Admission: RE | Admit: 2015-08-31 | Discharge: 2015-08-31 | Disposition: A | Discharge status: Home / Self Care | Payer: Medicaid Other | Source: Ambulatory Visit | Attending: Interventional Radiology | Admitting: Interventional Radiology

## 2015-08-31 DIAGNOSIS — C01 Malignant neoplasm of base of tongue: Secondary | ICD-10-CM

## 2015-09-01 ENCOUNTER — Ambulatory Visit (HOSPITAL_COMMUNITY): Payer: Medicaid Other | Attending: Pulmonary Disease | Admitting: Speech Pathology

## 2015-09-01 DIAGNOSIS — I890 Lymphedema, not elsewhere classified: Secondary | ICD-10-CM | POA: Insufficient documentation

## 2015-09-01 DIAGNOSIS — R1310 Dysphagia, unspecified: Secondary | ICD-10-CM

## 2015-09-01 NOTE — Therapy (Signed)
[place], [place], [address] Phone: [phone]   Fax:  [phone]  Speech Language Pathology Treatment  Patient Details  Name: BIANICA GOMBAR MRN: [id number] Date of Birth: [birth year] No Data Recorded  Encounter Date: 09/01/2015      End of Session - 09/01/15 1256    Visit Number 3   Number of Visits 4   Authorization Type Medicaid   Authorization Time Period 08/18/2015-09/14/2015   Authorization - Visit Number 2   Authorization - Number of Visits 3   SLP Start Time [id number]   SLP Stop Time  1055   SLP Time Calculation (min) 40 min   Activity Tolerance Patient tolerated treatment well      Past Medical History  Diagnosis Date  . Heart murmur     as a child  . Chronic bronchitis ([place])   . Anxiety     panic attacks  . Depression   . Headache     onset a few months ago  . Neuropathy ([place])     had it in both hands  . Arthritis   . Fibromyalgia   . Anemia   . Rosacea   . Hypercholesterolemia   . Asthma   . COPD (chronic obstructive pulmonary disease) ([place])   . Seizures ([place])     takes Gabapentin  (last one in 2010); head trauma caused seizures.  . Malignant neoplasm of base of tongue ([place]) 2016    Base of tongue with neck metastases    Past Surgical History  Procedure Laterality Date  . Wisdom tooth extraction    . Tonsillectomy Left 08/20/2014    Procedure: TONSILLECTOMY;  Surgeon: Ruby Cola, MD;  Location: [facility] OR;  Service: ENT;  Laterality: Left;  . Radical neck dissection Left 08/20/2014    Procedure: RADICAL LEFT NECK DISSECTION ;  Surgeon: Ruby Cola, MD;  Location: [place];  Service: ENT;  Laterality: Left;  . Tracheostomy tube placement N/A 08/20/2014    Procedure: TRACHEOSTOMY - AWAKE;  Surgeon: Ruby Cola, MD;  Location: [facility];  Service: ENT;  Laterality: N/A;  . Gastrostomy tube placement    . Multiple extractions with alveoloplasty N/A 09/17/2014    Procedure: Extraction of  tooth #'s [phone] with alveoloplasty, mandibular left lingual torus reduction, and gross debridement of remaining teeth.;  Surgeon: Lenn Cal, DDS;  Location: [facility];  Service: Oral Surgery;  Laterality: N/A;  . Portacath placement    . Colonoscopy with propofol N/A 04/15/2015    RMR: colonic polyps removed as described above.,  . Polypectomy N/A 04/15/2015    Procedure: POLYPECTOMY;  Surgeon: Daneil Dolin, MD;  [facility];  Service: Endoscopy;  Laterality: N/A;  . Esophagogastroduodenoscopy (egd) with propofol N/A 07/19/2015    RMR: poorly postitioned PEG tube. Status post removal and replacement of a small bowel video capsule  . Givens capsule study N/A 07/19/2015    Procedure: GIVENS CAPSULE STUDY;  Surgeon: Daneil Dolin, MD;  [facility];  Service: Endoscopy;  Laterality: N/A;    There were no vitals filed for this visit.  Visit Diagnosis: Dysphagia      Subjective Assessment - 09/01/15 1253    Subjective "I think I had the flu last week."   Currently in Pain? No/denies               ADULT SLP TREATMENT - 09/01/15 1253    General Information   Behavior/Cognition Alert;Cooperative;Pleasant mood  Patient Positioning Upright in chair   Oral care provided N/A   HPI Nilaya Lundell is a 50 y.o. 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 She is being followed by GI for anemia and was found to have PNA in August and December 2016.   Treatment Provided   Treatment provided Dysphagia   Dysphagia Treatment   Temperature Spikes Noted No  Pt reports fever last week   Respiratory Status Room air   Treatment Methods Skilled observation;Therapeutic exercise;Compensation strategy  training;Patient/caregiver education   Patient observed directly with PO's Yes   Type of PO's observed Thin liquids   Feeding Able to feed self   Liquids provided via Cup   Pharyngeal Phase Signs & Symptoms Multiple swallows;Delayed throat clear   Type of cueing Verbal;Visual   Amount of cueing Minimal   Other treatment/comments --  Pt states that she has not used the thickener and doesn't pl   Pain Assessment   Pain Assessment No/denies pain   Assessment / Recommendations / Plan   Plan Continue with current plan of care   Progression Toward Goals   Progression toward goals Progressing toward goals          SLP Education - 09/01/15 1255    Education provided Yes   Education Details recommendations for continued pharyngeal strengthening exercises   Person(s) Educated Patient   Methods Explanation;Demonstration   Comprehension Verbalized understanding          SLP Short Term Goals - 09/01/15 1256    SLP SHORT TERM GOAL #1   Title Pt will complete pharyngeal strengthening exercises and OME as assigned with use of written cue 2x/day.   Baseline none   Time 3   Period Weeks   Status On-going   SLP SHORT TERM GOAL #2   Title Pt will consume mechanical soft diet and all liquid consistencies with use of strategies PRN.   Baseline will not thicken liquids   Time 3   Period Weeks   Status On-going          SLP Long Term Goals - 09/01/15 1256    SLP LONG TERM GOAL #1   Title Pt will demonstrate safe and efficient consumption of highest recommended diet with use of strategies.   Baseline Minimal po consumption   Time 3   Period Weeks   Status On-going          Plan - 09/01/15 1256    Clinical Impression Statement Ms. Salow was seen for her second dysphagia treatment session this date. She continues to decline use of thickening agent and reports she only drinks bottled water at this time for liquids. Results of MBSS were again reinforced (Pt aspirating trace  amounts of thin liquids generally after the swallow from pooling in pyriforms that spill into laryngeal vestibule. Pt advised to thicken all liquids to nectar-consistency). She reports that she is unlikely to use despite risks for aspiration. Pt advised to swallow once, give short cough, and immediately swallow again if she isn't going to thicken her liquids (still at risk for aspiration). Pt stated that she is using her PEG at night for tube feeds at a rate of 100/hour (was advised 75 earlier per pt). She has also purchased a wedge for sleeping to decrease risk  of reflux and aspiration of tube feeds. Pt continued to be bothered by excess tissue under chin and information regarding lymphedema therapy s/p head and neck cancer treatment was provided Serafina Royals, PT in [place]). Pt may wish to transfer her one remaining ST session to PT for lymphedema therapy. Will determine after evaluation completed. Pt encouraged to continue pharyngeal strengthening exercises and increasing po intake in order to decrease use of PEG.    Speech Therapy Frequency 1x /week   Duration 2 weeks  8 weeks   Treatment/Interventions Aspiration precaution training;Pharyngeal strengthening exercises;Diet toleration management by SLP;Compensatory techniques;Trials of upgraded texture/liquids;Cueing hierarchy;SLP instruction and feedback;Patient/family education  MBSS   Potential to Achieve Goals Fair   Potential Considerations Pain level   SLP Home Exercise Plan Pt will complete HEP as assigned to faciliate carryover of treatment strategies in home environment   Consulted and Agree with Plan of Care Patient        Problem List Patient Active Problem List   Diagnosis Date Noted  . History of colonic polyps   . Anemia 03/23/2015  . 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 Therapeutic opioid induced constipation 09/16/2014  . 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 Malignant neoplasm of base of tongue ([place]) 09/02/2014  .  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 [place], [place], [address] Phone: [phone]   Fax:  [phone]   Name: LITA CAMPANA MRN: [id number] Date of Birth: February 03, 1966

## 2015-09-06 ENCOUNTER — Telehealth (HOSPITAL_COMMUNITY): Payer: Self-pay | Admitting: Speech Pathology

## 2015-09-06 ENCOUNTER — Ambulatory Visit (HOSPITAL_COMMUNITY): Payer: Medicaid Other | Admitting: Speech Pathology

## 2015-09-06 NOTE — Telephone Encounter (Signed)
SPEECH PATHOLOGY  Pt had to cancel appointment today due to dental appointment. She has one remaining visit from [facility], however pt is interested in lymphedema therapy for head and neck area. I have been in contact with Serafina Royals, PT. They will be contacting pt for appointment (evaluation). Pt notified of this via phone call today. Pt also reported that she will be going for chest x-ray this week and has a follow up appointment with Dr. Luan Pulling sometime in April.   Thank you,  Genene Churn, CCC-SLP [phone]

## 2015-09-08 ENCOUNTER — Encounter (HOSPITAL_COMMUNITY): Payer: Medicaid Other | Attending: Hematology & Oncology | Admitting: Hematology & Oncology

## 2015-09-08 ENCOUNTER — Encounter (HOSPITAL_COMMUNITY): Payer: Medicaid Other | Attending: Hematology & Oncology

## 2015-09-08 ENCOUNTER — Ambulatory Visit (HOSPITAL_COMMUNITY)
Admission: RE | Admit: 2015-09-08 | Discharge: 2015-09-08 | Disposition: A | Discharge status: Home / Self Care | Payer: Medicaid Other | Source: Ambulatory Visit | Attending: Pulmonary Disease | Admitting: Pulmonary Disease

## 2015-09-08 ENCOUNTER — Encounter (HOSPITAL_COMMUNITY): Payer: Self-pay | Admitting: Hematology & Oncology

## 2015-09-08 ENCOUNTER — Other Ambulatory Visit (HOSPITAL_COMMUNITY): Payer: Self-pay | Admitting: Pulmonary Disease

## 2015-09-08 VITALS — BP 116/82 | HR 93 | Temp 98.6°F | Resp 18 | Wt 108.0 lb

## 2015-09-08 DIAGNOSIS — C01 Malignant neoplasm of base of tongue: Secondary | ICD-10-CM | POA: Diagnosis not present

## 2015-09-08 DIAGNOSIS — K1230 Oral mucositis (ulcerative), unspecified: Secondary | ICD-10-CM

## 2015-09-08 DIAGNOSIS — N189 Chronic kidney disease, unspecified: Secondary | ICD-10-CM

## 2015-09-08 DIAGNOSIS — R110 Nausea: Secondary | ICD-10-CM | POA: Diagnosis not present

## 2015-09-08 DIAGNOSIS — Z8701 Personal history of pneumonia (recurrent): Secondary | ICD-10-CM | POA: Diagnosis not present

## 2015-09-08 DIAGNOSIS — R05 Cough: Secondary | ICD-10-CM | POA: Diagnosis not present

## 2015-09-08 DIAGNOSIS — D631 Anemia in chronic kidney disease: Secondary | ICD-10-CM

## 2015-09-08 DIAGNOSIS — C140 Malignant neoplasm of pharynx, unspecified: Secondary | ICD-10-CM

## 2015-09-08 DIAGNOSIS — J189 Pneumonia, unspecified organism: Secondary | ICD-10-CM | POA: Diagnosis present

## 2015-09-08 DIAGNOSIS — F418 Other specified anxiety disorders: Secondary | ICD-10-CM

## 2015-09-08 DIAGNOSIS — N183 Chronic kidney disease, stage 3 (moderate): Secondary | ICD-10-CM | POA: Diagnosis not present

## 2015-09-08 DIAGNOSIS — J449 Chronic obstructive pulmonary disease, unspecified: Secondary | ICD-10-CM | POA: Diagnosis present

## 2015-09-08 DIAGNOSIS — E871 Hypo-osmolality and hyponatremia: Secondary | ICD-10-CM | POA: Diagnosis not present

## 2015-09-08 DIAGNOSIS — Z87891 Personal history of nicotine dependence: Secondary | ICD-10-CM | POA: Diagnosis not present

## 2015-09-08 DIAGNOSIS — R634 Abnormal weight loss: Secondary | ICD-10-CM | POA: Diagnosis not present

## 2015-09-08 DIAGNOSIS — R682 Dry mouth, unspecified: Secondary | ICD-10-CM

## 2015-09-08 DIAGNOSIS — Z1239 Encounter for other screening for malignant neoplasm of breast: Secondary | ICD-10-CM

## 2015-09-08 LAB — CBC WITH DIFFERENTIAL/PLATELET
Basophils Absolute: 0 10*3/uL (ref 0.0–0.1)
Basophils Relative: 0 %
Eosinophils Absolute: 0 10*3/uL (ref 0.0–0.7)
Eosinophils Relative: 0 %
HCT: 33 % — ABNORMAL LOW (ref 36.0–46.0)
Hemoglobin: 11.3 g/dL — ABNORMAL LOW (ref 12.0–15.0)
Lymphocytes Relative: 5 %
Lymphs Abs: 0.5 10*3/uL — ABNORMAL LOW (ref 0.7–4.0)
MCH: 32.4 pg (ref 26.0–34.0)
MCHC: 34.2 g/dL (ref 30.0–36.0)
MCV: 94.6 fL (ref 78.0–100.0)
Monocytes Absolute: 0.7 10*3/uL (ref 0.1–1.0)
Monocytes Relative: 7 %
Neutro Abs: 9.2 10*3/uL — ABNORMAL HIGH (ref 1.7–7.7)
Neutrophils Relative %: 88 %
Platelets: 337 10*3/uL (ref 150–400)
RBC: 3.49 MIL/uL — ABNORMAL LOW (ref 3.87–5.11)
RDW: 13.7 % (ref 11.5–15.5)
WBC: 10.5 10*3/uL (ref 4.0–10.5)

## 2015-09-08 LAB — BASIC METABOLIC PANEL WITH GFR
Anion gap: 7 (ref 5–15)
BUN: 17 mg/dL (ref 6–20)
CO2: 26 mmol/L (ref 22–32)
Calcium: 9 mg/dL (ref 8.9–10.3)
Chloride: 96 mmol/L — ABNORMAL LOW (ref 101–111)
Creatinine, Ser: 1.54 mg/dL — ABNORMAL HIGH (ref 0.44–1.00)
GFR calc Af Amer: 45 mL/min — ABNORMAL LOW
GFR calc non Af Amer: 39 mL/min — ABNORMAL LOW
Glucose, Bld: 90 mg/dL (ref 65–99)
Potassium: 4.4 mmol/L (ref 3.5–5.1)
Sodium: 129 mmol/L — ABNORMAL LOW (ref 135–145)

## 2015-09-08 LAB — FERRITIN: Ferritin: 266 ng/mL (ref 11–307)

## 2015-09-08 MED ORDER — HEPARIN SOD (PORK) LOCK FLUSH 100 UNIT/ML IV SOLN
INTRAVENOUS | Status: AC
  Filled 2015-09-08: qty 5

## 2015-09-08 MED ORDER — FENTANYL 100 MCG/HR TD PT72: 100.0000 ug | MEDICATED_PATCH | TRANSDERMAL | Status: DC

## 2015-09-08 MED ORDER — SODIUM CHLORIDE 0.9% FLUSH
10.0000 mL | INTRAVENOUS | Status: DC | PRN
  Administered 2015-09-08: 10 mL via INTRAVENOUS
  Filled 2015-09-08: qty 10

## 2015-09-08 MED ORDER — CEVIMELINE HCL 30 MG PO CAPS: 30.0000 mg | ORAL_CAPSULE | Freq: Three times a day (TID) | ORAL | Status: DC

## 2015-09-08 MED ORDER — DULOXETINE HCL 30 MG PO CPEP: 30.0000 mg | ORAL_CAPSULE | Freq: Every day | ORAL | Status: DC

## 2015-09-08 MED ORDER — HEPARIN SOD (PORK) LOCK FLUSH 100 UNIT/ML IV SOLN
500.0000 [IU] | Freq: Once | INTRAVENOUS | Status: AC
  Administered 2015-09-08: 500 [IU] via INTRAVENOUS

## 2015-09-08 MED ORDER — LORAZEPAM 2 MG PO TABS: 2.0000 mg | ORAL_TABLET | Freq: Four times a day (QID) | ORAL | Status: DC | PRN

## 2015-09-08 MED ORDER — FENTANYL 75 MCG/HR TD PT72: 75.0000 ug | MEDICATED_PATCH | TRANSDERMAL | Status: DC

## 2015-09-08 MED ORDER — CYCLOBENZAPRINE HCL 10 MG PO TABS: 10.0000 mg | ORAL_TABLET | Freq: Three times a day (TID) | ORAL | Status: DC | PRN

## 2015-09-08 MED ORDER — LORAZEPAM 2 MG PO TABS: 2.0000 mg | ORAL_TABLET | Freq: Four times a day (QID) | ORAL | Status: AC | PRN

## 2015-09-08 MED ORDER — LEVOTHYROXINE SODIUM 25 MCG PO TABS: 25.0000 ug | ORAL_TABLET | Freq: Every day | ORAL | Status: DC

## 2015-09-08 NOTE — Patient Instructions (Addendum)
[place] at [facility] Discharge Instructions  RECOMMENDATIONS MADE BY THE CONSULTANT AND ANY TEST RESULTS WILL BE SENT TO YOUR REFERRING PHYSICIAN.   Exam and discussion by Dr Whitney Muse today Refill on fentanyl, ativan, synthroid, flexeril, and cymbalta (sent to [facility] pharmacy) Port flush with labs today Port flush with labs every 2 weeks  Aranesp if needed every 2 weeks Stay on cymbalta right now, get another appt with your physiologist PET scan scheduled  Mammogram scheduled  Try lemon drops to help with the dry mouth     Return to see the doctor after the PET scan  Please call the clinic if you have any questions or concerns    Thank you for choosing [place] at [facility] to provide your oncology and hematology care.  To afford each patient quality time with our provider, please arrive at least 15 minutes before your scheduled appointment time.   Beginning January 23rd 2017 lab work for the [facility] will be done in the  Main lab at [facility] on 1st floor. If you have a lab appointment with the [facility] please come in thru the  Main Entrance and check in at the main information desk  You need to re-schedule your appointment should you arrive 10 or more minutes late.  We strive to give you quality time with our providers, and arriving late affects you and other patients whose appointments are after yours.  Also, if you no show three or more times for appointments you may be dismissed from the clinic at the providers discretion.     Again, thank you for choosing [facility].  Our hope is that these requests will decrease the amount of time that you wait before being seen by our physicians.       _____________________________________________________________  Should you have questions after your visit to [facility], please contact our office at [phone] between the hours of 8:30 a.m. and  4:30 p.m.  Voicemails left after 4:30 p.m. will not be returned until the following business day.  For prescription refill requests, have your pharmacy contact our office.         Resources For Cancer Patients and their Caregivers ? American Cancer Society: Can assist with transportation, wigs, general needs, runs Look Good Feel Better.        954-551-2799 ? Cancer Care: Provides financial assistance, online support groups, medication/co-pay assistance.  1-800-813-HOPE 928 235 5544) ? [facility] Assists [place] cancer patients and their families through emotional , educational and financial support.  431-724-0906 ? [place] DSS Where to apply for food stamps, Medicaid and utility assistance. 415-551-1664 ? [facility]: Transportation to medical appointments. (630)841-8365 ? Social Security Administration: May apply for disability if have a Stage IV cancer. (989)487-0605 7800552902 ? [employer], Disability and Transit Services: Assists with nutrition, care and transit needs. [phone]

## 2015-09-08 NOTE — Progress Notes (Signed)
Patient didn't need an injection r/t lab results and treatment parameters.  Patient aware and new schedule given for follow up.

## 2015-09-08 NOTE — Progress Notes (Signed)
Shelby Larson presented for Portacath access and flush.  Proper placement of portacath confirmed by CXR.  Portacath located right chest wall accessed with  H 20 needle.  Good blood return present. Portacath flushed with 20ml NS and 500U/5ml Heparin and needle removed intact.  Procedure tolerated well and without incident.    

## 2015-09-08 NOTE — Progress Notes (Signed)
Please see MD encounter for port flush.

## 2015-09-08 NOTE — Progress Notes (Signed)
Shelby Labrum, MD [place] [address]  Malignant neoplasm of base of tongue   Staging form: Lip and Oral Cavity, AJCC 7th Edition     Clinical stage from 09/02/2014: Stage IVA (T2, N2c, M0) - Unsigned  HPV negative At least T2N2cMx HPV negative moderately differentiated Stage IVA Squamous cell carcinoma, base of tongue  Dr Simeon Craft 08/20/14 Left selective neck dissection with sparing of 11th cranial nerve, sternocleidomastoid muscle, internal jugular vein, biopsy , tracheotomy     Malignant neoplasm of base of tongue ([place])   08/20/2014 Surgery Diagnosis 1. Tongue, biopsy, left base/ pharynx mass - INVASIVE MODERATELY DIFFERENTIATED SQUAMOUS CELL CARCINOMA. - SEE COMMENT. 2. Lymph nodes, radical neck dissection, Left - THREE OF SIXTEEN LYMPH NODES POSITIVE FOR METASTATIC SQUAMOUS CELL [facility]   09/18/2014 Surgery Multiple extraction of tooth numbers 2, 14, 15, 18, and 31.  4 Quadrants of alveoloplasty.  Gross debridement of remaining dentition. Mandibular left lingual torus reduction   10/08/2014 - 11/19/2014 Chemotherapy Cisplatin every 21 days   10/09/2014 - 11/30/2014 Radiation Therapy [name] with twice daily dosing at the end of treatment at patient's request due to trip to [facility] against medical advice.   02/22/2015 PET scan Prior oropharyngeal mass has essentially resolved.  No findings specific for metastatic disease.   02/22/2015 Remission      CURRENT THERAPY:Observation  INTERVAL HISTORY: Shelby Larson 50 y.o. 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 She has completed concurrent XRT/cisplatin.   [name] is accompanied by her mother.  She had an appointment to see a psychiatrist across the street at [facility]  but had to cancel the appointment. She has been meaning to reschedule.  She has not drank alcohol since her treatment. This has not been a struggle for her.  She does smoke cigarettes and this is a struggle. She smokes about 7-8 cigarettes per day. States that it is easy to quit, however she doesn't want to. Reports she has quit before.   When discussing her weight loss, she states she has trouble swallowing and throat soreness. Reports her tongue is "out of whack" and everything she eats burns, even mild flavors. She no longer has issues with taste. Chewing has become difficult. She finds eating to be unpleasant. She believes her last scope was about one month and a half ago. Her next appointment with [name] is in July.  She does not like to take hydrocodone as it worsens her dry mouth. She previously tried biotine to resolve dry mouth but it did not. She finds hard candies to be unpleasant in her mouth. She is inquiring about other options to alleviate dry mouth.  She follows with Dr. Luan Pulling the beginning of next month. She has completed antibiotic therapy. She notes that her cough is much improved but still has a dry cough.  Her last mammogram was over a year ago.   Past Medical History  Diagnosis Date  . Heart murmur     as a child  . Chronic bronchitis ([place])   . Anxiety     panic attacks  . Depression   . Headache     onset a few months ago  . Neuropathy ([place])     had it in both hands  .  Arthritis   . Fibromyalgia   . Anemia   . Rosacea   . Hypercholesterolemia   . Asthma   . COPD (chronic obstructive pulmonary disease) ([place])   . Seizures ([place])     takes Gabapentin  (last one in 2010); head trauma caused seizures.  . Malignant neoplasm of base of tongue ([place]) 2016    Base of tongue with neck metastases    has Malignant neoplasm of pharynx ([place]); Malignant neoplasm of base of tongue ([place]); Recurrent cold sores; Therapeutic opioid induced constipation; Chronic  periodontitis; Anemia; and History of colonic polyps on her problem list.     is allergic to bee venom; penicillins; and latex.  Ms. Safer had no medications administered during this visit.  Past Surgical History  Procedure Laterality Date  . Wisdom tooth extraction    . Tonsillectomy Left 08/20/2014    Procedure: TONSILLECTOMY;  Surgeon: Ruby Cola, MD;  Location: [facility] OR;  Service: ENT;  Laterality: Left;  . Radical neck dissection Left 08/20/2014    Procedure: RADICAL LEFT NECK DISSECTION ;  Surgeon: Ruby Cola, MD;  Location: [facility];  Service: ENT;  Laterality: Left;  . Tracheostomy tube placement N/A 08/20/2014    Procedure: TRACHEOSTOMY - AWAKE;  Surgeon: Ruby Cola, MD;  Location: [facility];  Service: ENT;  Laterality: N/A;  . Gastrostomy tube placement    . Multiple extractions with alveoloplasty N/A 09/17/2014    Procedure: Extraction of tooth #'s [id number] with alveoloplasty, mandibular left lingual torus reduction, and gross debridement of remaining teeth.;  Surgeon: Lenn Cal, DDS;  Location: [facility];  Service: Oral Surgery;  Laterality: N/A;  . Portacath placement    . Colonoscopy with propofol N/A 04/15/2015    RMR: colonic polyps removed as described above.,  . Polypectomy N/A 04/15/2015    Procedure: POLYPECTOMY;  Surgeon: Daneil Dolin, MD;  [facility];  Service: Endoscopy;  Laterality: N/A;  . Esophagogastroduodenoscopy (egd) with propofol N/A 07/19/2015    RMR: poorly postitioned PEG tube. Status post removal and replacement of a small bowel video capsule  . Givens capsule study N/A 07/19/2015    Procedure: GIVENS CAPSULE STUDY;  Surgeon: Daneil Dolin, MD;  [facility];  Service: Endoscopy;  Laterality: N/A;    Positive for joint pain and weakness.     Achy joints. Positive for throat soreness, difficulty chewing, and difficulty swallowing, dry mouth 14 point review of systems was performed and is negative except as detailed under  history of present illness and above    PHYSICAL EXAMINATION  ECOG PERFORMANCE STATUS: 1 - Symptomatic but completely ambulatory  Filed Vitals:   09/08/15 1124  BP: 116/82  Pulse: 93  Temp: 98.6 F (37 C)  Resp: 18    GENERAL:alert, no distress, well nourished, well developed, comfortable, thin SKIN: skin color, texture, turgor are normal, no rashes or significant lesions. Bilateral neck, with well healed XRT changes, good ROM HEAD: Normocephalic, No masses, lesions, tenderness or abnormalities MOUTH: Dry mouth EYES: normal, PERRLA, EOMI, Conjunctiva are pink and non-injected EARS: External ears normal OROPHARYNX:lips, buccal mucosa, and tongue normal and mucous membranes are dry, minimal erythema  NECK: supple, trachea midline, tracheostomy site well healed LYMPH:  no palpable lymphadenopathy BREAST:not examined LUNGS: occasional coarse rhonchi, otherwise clear HEART: regular rhythm, tachycardic ABDOMEN:non-tender PEG site C/D/I BACK: Back symmetric, no curvature. EXTREMITIES:less then 2 second capillary refill, no skin discoloration  NEURO: alert & oriented x 3 with fluent speech, gait normal  LABORATORY DATA: I have reviewed his laboratory data listed below Results for IYANI, DRESNER (MRN [id number])   Ref. Range 09/08/2015 12:19  Sodium Latest Ref Range: 135-145 mmol/L 129 (L)  Potassium Latest Ref Range: 3.5-5.1 mmol/L 4.4  Chloride Latest Ref Range: 101-111 mmol/L 96 (L)  CO2 Latest Ref Range: 22-32 mmol/L 26  BUN Latest Ref Range: 6-20 mg/dL 17  Creatinine Latest Ref Range: 0.44-1.00 mg/dL 1.54 (H)  Calcium Latest Ref Range: 8.9-10.3 mg/dL 9.0  EGFR (Non-African Amer.) Latest Ref Range: >60 mL/min 39 (L)  EGFR (African American) Latest Ref Range: >60 mL/min 45 (L)  Glucose Latest Ref Range: 65-99 mg/dL 90  Anion gap Latest Ref Range: 5-15  7  WBC Latest Ref Range: 4.0-10.5 K/uL 10.5  RBC Latest Ref Range: 3.87-5.11 MIL/uL 3.49 (L)  Hemoglobin Latest Ref  Range: 12.0-15.0 g/dL 11.3 (L)  HCT Latest Ref Range: 36.0-46.0 % 33.0 (L)  MCV Latest Ref Range: 78.0-100.0 fL 94.6  MCH Latest Ref Range: 26.0-34.0 pg 32.4  MCHC Latest Ref Range: 30.0-36.0 g/dL 34.2  RDW Latest Ref Range: 11.5-15.5 % 13.7  Platelets Latest Ref Range: 150-400 K/uL 337  Neutrophils Latest Units: % 88  Lymphocytes Latest Units: % 5  Monocytes Relative Latest Units: % 7  Eosinophil Latest Units: % 0  Basophil Latest Units: % 0  NEUT# Latest Ref Range: 1.7-7.7 K/uL 9.2 (H)  Lymphocyte # Latest Ref Range: 0.7-4.0 K/uL 0.5 (L)  Monocyte # Latest Ref Range: 0.1-1.0 K/uL 0.7  Eosinophils Absolute Latest Ref Range: 0.0-0.7 K/uL 0.0  Basophils Absolute Latest Ref Range: 0.0-0.1 K/uL 0.0     ASSESSMENT AND PLAN:  T2N2cMx HPV negative moderately differentiated Stage IVA Squamous cell carcinoma, base of tongue Mucositis Nausea Weight loss, > 10% body weight Anxiety 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 We again discussed minimizing break through pain medication use.   I have refilled her ativan.  I have ordered a repeat PET scan. She has persistent cough, but now changed in nature. She has noted some lateral chest pain. Weight is down.  I have scheduled her for a mammogram.  She will continue cymbalta. She will try to reschedule with psychiatry at [facility], however if she is not able to we will refer her to another psychiatry office.   I will look into other medications to help with her dry mouth. She has tried biotine without relief. I have given her a prescription for [facility]. I have discussed side effects of this medication with the patient as well as benefits.  We discussed continuing with aranesp and biweekly CBC's. She will continue to have ferritin levels checked every 6  weeks.   She will return after her PET scan to discuss the results.   All questions were answered. The patient knows to call the clinic with any problems, questions or concerns. We can certainly see the patient much sooner if necessary.   This document serves as a record of services personally performed by Ancil Linsey, MD. It was created on her behalf by Arlyce Harman, a trained medical scribe. The creation of this record is based on the scribe's personal observations and the provider's statements to them. This document has been checked and approved by the attending provider.  I have reviewed the above documentation for accuracy and completeness, and I agree with the above.  Kelby Fam. Britain Saber MD

## 2015-09-15 ENCOUNTER — Encounter (HOSPITAL_COMMUNITY)
Admission: RE | Admit: 2015-09-15 | Discharge: 2015-09-15 | Disposition: A | Discharge status: Home / Self Care | Payer: Medicaid Other | Source: Ambulatory Visit | Attending: Hematology & Oncology | Admitting: Hematology & Oncology

## 2015-09-15 DIAGNOSIS — R938 Abnormal findings on diagnostic imaging of other specified body structures: Secondary | ICD-10-CM | POA: Diagnosis present

## 2015-09-15 DIAGNOSIS — C140 Malignant neoplasm of pharynx, unspecified: Secondary | ICD-10-CM | POA: Insufficient documentation

## 2015-09-15 DIAGNOSIS — C01 Malignant neoplasm of base of tongue: Secondary | ICD-10-CM | POA: Insufficient documentation

## 2015-09-15 DIAGNOSIS — R9389 Abnormal findings on diagnostic imaging of other specified body structures: Secondary | ICD-10-CM

## 2015-09-15 DIAGNOSIS — R590 Localized enlarged lymph nodes: Secondary | ICD-10-CM | POA: Insufficient documentation

## 2015-09-15 LAB — GLUCOSE, CAPILLARY: GLUCOSE-CAPILLARY: 90 mg/dL (ref 65–99)

## 2015-09-15 MED ORDER — FLUDEOXYGLUCOSE F - 18 (FDG) INJECTION
5.3600 | Freq: Once | INTRAVENOUS | Status: AC | PRN
  Administered 2015-09-15: 5.36 via INTRAVENOUS

## 2015-09-16 ENCOUNTER — Other Ambulatory Visit: Payer: Self-pay

## 2015-09-16 DIAGNOSIS — C140 Malignant neoplasm of pharynx, unspecified: Secondary | ICD-10-CM

## 2015-09-17 ENCOUNTER — Encounter (HOSPITAL_BASED_OUTPATIENT_CLINIC_OR_DEPARTMENT_OTHER): Payer: Medicaid Other | Admitting: Hematology & Oncology

## 2015-09-17 ENCOUNTER — Encounter (HOSPITAL_COMMUNITY): Payer: Self-pay | Admitting: Lab

## 2015-09-17 ENCOUNTER — Encounter (HOSPITAL_COMMUNITY): Payer: Self-pay | Admitting: Hematology & Oncology

## 2015-09-17 ENCOUNTER — Other Ambulatory Visit (HOSPITAL_COMMUNITY): Payer: Self-pay | Admitting: Hematology & Oncology

## 2015-09-17 VITALS — BP 103/64 | HR 91 | Temp 97.7°F | Resp 20 | Wt 105.9 lb

## 2015-09-17 DIAGNOSIS — R682 Dry mouth, unspecified: Secondary | ICD-10-CM

## 2015-09-17 DIAGNOSIS — N189 Chronic kidney disease, unspecified: Secondary | ICD-10-CM

## 2015-09-17 DIAGNOSIS — C140 Malignant neoplasm of pharynx, unspecified: Secondary | ICD-10-CM

## 2015-09-17 DIAGNOSIS — Z1231 Encounter for screening mammogram for malignant neoplasm of breast: Secondary | ICD-10-CM

## 2015-09-17 DIAGNOSIS — D649 Anemia, unspecified: Secondary | ICD-10-CM

## 2015-09-17 DIAGNOSIS — K117 Disturbances of salivary secretion: Secondary | ICD-10-CM

## 2015-09-17 DIAGNOSIS — R634 Abnormal weight loss: Secondary | ICD-10-CM | POA: Diagnosis not present

## 2015-09-17 DIAGNOSIS — R9389 Abnormal findings on diagnostic imaging of other specified body structures: Secondary | ICD-10-CM

## 2015-09-17 DIAGNOSIS — F418 Other specified anxiety disorders: Secondary | ICD-10-CM | POA: Diagnosis not present

## 2015-09-17 DIAGNOSIS — C01 Malignant neoplasm of base of tongue: Secondary | ICD-10-CM | POA: Diagnosis not present

## 2015-09-17 MED ORDER — BACITRACIN 500 UNIT/GM EX OINT: 1.0000 "application " | TOPICAL_OINTMENT | Freq: Two times a day (BID) | CUTANEOUS | Status: DC

## 2015-09-17 NOTE — Progress Notes (Signed)
Referral sent to [facility].  Patient to go as walk-in. Gave patient info.

## 2015-09-17 NOTE — Progress Notes (Signed)
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 She has completed concurrent XRT/cisplatin.   [name] returns to the [facility] alone today. She is here to review recent PET/CT imaging.   PET/CT results were reviewed in detail. She notes that her chest is really sore a lot.  She was advised we need to obtain a biopsy to move forward. She says she's had a really bad dry cough lately and was advised that this is probably also due to the mass seen in her PET.  After the news, she notes that she's "gotta roll with the punches," and that she is doing fine all things considered. She says this year hasn't been the greatest, but last year was worse.   She notes her FT site is sore. She has had more difficulty with minor skin irritation and is requesting something for it.   She would like a referral to [facility]. She notes that her dry mouth seems slightly better with her "new medication."     Malignant neoplasm of base of tongue ([place])   08/20/2014 Surgery Diagnosis 1. Tongue, biopsy, left base/ pharynx mass - INVASIVE MODERATELY DIFFERENTIATED  SQUAMOUS CELL CARCINOMA. - SEE COMMENT. 2. Lymph nodes, radical neck dissection, Left - THREE OF SIXTEEN LYMPH NODES POSITIVE FOR METASTATIC SQUAMOUS CELL [facility]   09/18/2014 Surgery Multiple extraction of tooth numbers 2, 14, 15, 18, and 31.  4 Quadrants of alveoloplasty.  Gross debridement of remaining dentition. Mandibular left lingual torus reduction   10/08/2014 - 11/19/2014 Chemotherapy Cisplatin every 21 days   10/09/2014 - 11/30/2014 Radiation Therapy [name] with twice daily dosing at the end of treatment at patient's request due to trip to [facility] against medical advice.   02/22/2015 PET scan Prior oropharyngeal mass has essentially resolved.  No findings specific for metastatic disease.   02/22/2015 Remission      Past Medical History  Diagnosis Date  . Heart murmur     as a child  . Chronic bronchitis ([place])   . Anxiety     panic attacks  . Depression   . Headache     onset a few months ago  . Neuropathy ([place])     had it in both hands  . Arthritis   . Fibromyalgia   . Anemia   . Rosacea   . Hypercholesterolemia   . Asthma   . COPD (chronic obstructive pulmonary disease) ([place])   . Seizures ([place])     takes Gabapentin  (last one in 2010); head trauma caused seizures.  . 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     is allergic to bee venom; penicillins; and latex.  Ms. Kracht had no medications administered during this visit.  Past Surgical History  Procedure Laterality Date  . Wisdom tooth extraction    . Tonsillectomy Left 08/20/2014    Procedure: TONSILLECTOMY;  Surgeon: Ruby Cola, MD;  Location: [facility] OR;  Service: ENT;  Laterality: Left;  . Radical neck dissection Left 08/20/2014     Procedure: RADICAL LEFT NECK DISSECTION ;  Surgeon: Ruby Cola, MD;  Location: [facility];  Service: ENT;  Laterality: Left;  . Tracheostomy tube placement N/A 08/20/2014    Procedure: TRACHEOSTOMY - AWAKE;  Surgeon: Ruby Cola, MD;  Location: [facility];  Service: ENT;  Laterality: N/A;  . Gastrostomy tube placement    . Multiple extractions with alveoloplasty N/A 09/17/2014    Procedure: Extraction of tooth #'s [phone] with alveoloplasty, mandibular left lingual torus reduction, and gross debridement of remaining teeth.;  Surgeon: Lenn Cal, DDS;  Location: [place];  Service: Oral Surgery;  Laterality: N/A;  . Portacath placement    . Colonoscopy with propofol N/A 04/15/2015    RMR: colonic polyps removed as described above.,  . Polypectomy N/A 04/15/2015    Procedure: POLYPECTOMY;  Surgeon: Daneil Dolin, MD;  Location: [facility]S;  Service: Endoscopy;  Laterality: N/A;  . Esophagogastroduodenoscopy (egd) with propofol N/A 07/19/2015    RMR: poorly postitioned PEG tube. Status post removal and replacement of a small bowel video capsule  . Givens capsule study N/A 07/19/2015    Procedure: GIVENS CAPSULE STUDY;  Surgeon: Daneil Dolin, MD;  Location: [facility];  Service: Endoscopy;  Laterality: N/A;    Positive for joint pain and weakness.     Achy joints. Positive for loss of appetite and dry mouth    Taste change. Rare difficulty swallowing.  14 point review of systems was performed and is negative except as detailed under history of present illness and above    PHYSICAL EXAMINATION  ECOG PERFORMANCE STATUS: 1 - Symptomatic but completely ambulatory  Filed Vitals:   09/17/15 1158  BP: 103/64  Pulse: 91  Temp: 97.7 F (36.5 C)  Resp: 20    GENERAL:alert, no distress, well nourished, well developed, comfortable, in chemotherapy chair SKIN: skin color, texture, turgor are normal, no rashes or significant lesions. Bilateral neck, anterior neck erythema HEAD:  Normocephalic, No masses, lesions, tenderness or abnormalities MOUTH: Dry mucus membranes EYES: normal, PERRLA, EOMI, Conjunctiva are pink and non-injected EARS: External ears normal OROPHARYNX:lips, buccal mucosa, and tongue normal and mucous membranes are moist minimal erythema  NECK: supple, trachea midline, tracheostomy site well healed. LYMPH:  no palpable lymphadenopathy BREAST:not examined LUNGS: LLL rhonchi, R lung clear. Occasional wheezing throughout LLL HEART: regular rhythm, tachycardic ABDOMEN:non-tender PEG site C/D/I Crusting and discharge around feeding tube site. BACK: Back symmetric, no curvature. EXTREMITIES:less then 2 second capillary refill, no skin discoloration  NEURO: alert & oriented x 3 with fluent speech, gait normal    LABORATORY DATA: I have reviewed his laboratory data listed below  CBC    Component Value Date/Time   WBC 10.5 09/08/2015 1219   WBC 11.3* 09/02/2014 1007   RBC 3.49* 09/08/2015 1219   RBC 3.22* 03/02/2015 1448   RBC 3.76 09/02/2014 1007   HGB 11.3* 09/08/2015 1219   HGB 11.1*  09/02/2014 1007   HCT 33.0* 09/08/2015 1219   HCT 34.8 09/02/2014 1007   PLT 337 09/08/2015 1219   PLT 396 09/02/2014 1007   MCV 94.6 09/08/2015 1219   MCV 92.6 09/02/2014 1007   MCH 32.4 09/08/2015 1219   MCH 29.5 09/02/2014 1007   MCHC 34.2 09/08/2015 1219   MCHC 31.9 09/02/2014 1007   RDW 13.7 09/08/2015 1219   RDW 15.4* 09/02/2014 1007   LYMPHSABS 0.5* 09/08/2015 1219   LYMPHSABS 1.5 09/02/2014 1007   MONOABS 0.7 09/08/2015 1219   MONOABS 1.1* 09/02/2014 1007   EOSABS 0.0 09/08/2015 1219   EOSABS 0.2 09/02/2014 1007   BASOSABS 0.0 09/08/2015 1219   BASOSABS 0.0 09/02/2014 1007   CMP     Component Value Date/Time   NA 129* 09/08/2015 1219   NA 137 09/02/2014 1007   K 4.4 09/08/2015 1219   K 4.5 09/02/2014 1007   CL 96* 09/08/2015 1219   CO2 26 09/08/2015 1219   CO2 27 09/02/2014 1007   GLUCOSE 90 09/08/2015 1219   GLUCOSE 87 09/02/2014  1007   BUN 17 09/08/2015 1219   BUN 10.7 09/02/2014 1007   CREATININE 1.54* 09/08/2015 1219   CREATININE 0.8 09/02/2014 1007   CALCIUM 9.0 09/08/2015 1219   CALCIUM 9.2 03/31/2015 1500   CALCIUM 10.1 09/02/2014 1007   PROT 7.2 02/23/2015 0930   ALBUMIN 3.6 03/31/2015 1500   AST 13* 02/23/2015 0930   ALT 7* 02/23/2015 0930   ALKPHOS 68 02/23/2015 0930   BILITOT 0.5 02/23/2015 0930   GFRNONAA 39* 09/08/2015 1219   GFRAA 45* 09/08/2015 1219    RADIOLOGY   Study Result     CLINICAL DATA: Subsequent treatment strategy for base of tongue cancer.  EXAM: NUCLEAR MEDICINE PET SKULL BASE TO THIGH  TECHNIQUE: 5.36 mCi F-18 FDG was injected intravenously. Full-ring PET imaging was performed from the skull base to thigh after the radiotracer. CT data was obtained and used for attenuation correction and anatomic localization.  FASTING BLOOD GLUCOSE: Value: 90 mg/dl  COMPARISON: CT chest dated 06/08/2015. PET-CT dated 02/22/2015.  FINDINGS: NECK  No hypermetabolic lymph nodes in the neck.  CHEST  2.2 x 4.2 cm soft tissue mass along the anterior aspect of the heart/medial aspect of the anterior left upper lobe (series 4/image 65), new from recent prior CT, max SUV 24.0.  Additional 4.7 x 1.7 cm soft tissue lesion along the left heart border/medial aspect of the left upper lobe (series 4/image 79), new from recent prior CT, max SUV 14.9.  These are both worrisome for pleural-based or less likely pericardial metastases.  Additional residual soft tissue nodularity along the left fissure measuring 1.4 x 1.7 cm (series 4/ image 76), max SUV 11.3. Associated cavitary masslike opacity with surrounding ground-glass opacity in the left upper and lower lobes has essentially resolved, suggesting an infectious etiology. As such, it is unclear whether this is related to residual infection or possibly additional pleural-based tumor.  Additional scattered  thin-walled cavitary lesions with scarring in the lingula and left lower lobe, related to prior infection. No pleural effusion or pneumothorax.  The heart is normal in size. No pericardial effusion. Right chest port terminates at the cavoatrial junction.  Hypermetabolic thoracic lymphadenopathy, including:  --6 mm short axis prevascular node (series 4/ image 50), max SUV 6.0  --7 mm short axis high right paratracheal node (series 4/ image 48), max SUV 4.8  --12 mm short axis subcarinal node (series 4/image 57), max SUV 9.9  --  Hypermetabolic focus in the left hilum, max SUV 8.4  --Mild hypermetabolism in the right hilum, max SUV 4.7  ABDOMEN/PELVIS  No abnormal hypermetabolic activity within the liver, pancreas, adrenal glands, or spleen.  Gastrostomy in satisfactory position. Atherosclerotic calcifications of the abdominal aorta and branch vessels. 3 mm nonobstructing right upper pole renal calculus.  No hypermetabolic lymph nodes in the abdomen or pelvis.  SKELETON  No focal hypermetabolic activity to suggest skeletal metastasis.  IMPRESSION: 2.2 x 4.2 cm soft tissue mass along the anterior aspect of the heart and 1.7 x 4.7 cm soft tissue mass along the left heart border, suspicious for pleural-based or less likely pericardial metastases, new.  Prior left upper and lower lobe patchy opacity has essentially resolved, likely infectious. Residual 1.4 x 1.7 cm pleural-based nodularity along the left fissure may reflect additional tumor versus residual infection.  Hypermetabolic thoracic lymphadenopathy, suspicious for nodal metastases, as above.   Electronically Signed  By: Julian Hy M.D.  On: 09/15/2015 15:55    ASSESSMENT AND PLAN:  T2N2cMx HPV negative moderately differentiated Stage IVA Squamous cell carcinoma, base of tongue Mucositis Nausea Weight loss, > 10% body weight Anxiety Anemia CKD Tracheostomy  removal Depression Xerostomia PET/CT 09/15/2015 suggestive of recurrence   We reviewed her imaging in detail.  I have advised her that I am suspicious of recurrence of her disease. We will see if Dr. Luan Pulling can biopsy but I suspect she will ultimately need a referral to [place] to CT surgery.  I discussed this with her and she is agreeable.    She does not know of any refills she needs today.  I will give her a prescription for bactroban ointment to use around her FT. She knows that if it doesn't improve, she may need a referral back to interventional radiology. She knows to let us know if it doesn't improve within a few days.  I will send [name] a copy of her PET scan.  I will put her down for 2 weeks follow-up, which we may move around a bit later or earlier depending on news of her biopsy. The goal will be for her to see one of them, obtain a biopsy, and then meet back up here to discuss the biopsy results.  I will provide a referral to [facility].  All questions were answered. The patient knows to call the clinic with any problems, questions or concerns. We can certainly see the patient much sooner if necessary.   This document serves as a record of services personally performed by Ancil Linsey, MD. It was created on her behalf by Toni Amend, a trained medical scribe. The creation of this record is based on the scribe's personal observations and the provider's statements to them. This document has been checked and approved by the attending provider.  I have reviewed the above documentation for accuracy and completeness, and I agree with the above.  Kelby Fam. Penland MD

## 2015-09-17 NOTE — Patient Instructions (Addendum)
[facility] at [facility] Discharge Instructions  RECOMMENDATIONS MADE BY THE CONSULTANT AND ANY TEST RESULTS WILL BE SENT TO YOUR REFERRING PHYSICIAN.   Exam and discussion by Dr Whitney Muse today We see an area in your chest region, near your heart. We are trying to see if Dr Luan Pulling will do a biopsy. We will call you by the end of the day or Monday. Bacitracin sent to [facility], you can apply this twice a day  Return to see the doctor in 2 weeks, we may move this appt  Referral to [facility], they will call you with this appt  Please call the clinic if you have any questions or concerns    Thank you for choosing [place] at [facility] to provide your oncology and hematology care.  To afford each patient quality time with our provider, please arrive at least 15 minutes before your scheduled appointment time.   Beginning January 23rd 2017 lab work for the [facility] will be done in the  Main lab at [facility] on 1st floor. If you have a lab appointment with the [facility] please come in thru the  Main Entrance and check in at the main information desk  You need to re-schedule your appointment should you arrive 10 or more minutes late.  We strive to give you quality time with our providers, and arriving late affects you and other patients whose appointments are after yours.  Also, if you no show three or more times for appointments you may be dismissed from the clinic at the providers discretion.     Again, thank you for choosing [facility].  Our hope is that these requests will decrease the amount of time that you wait before being seen by our physicians.       _____________________________________________________________  Should you have questions after your visit to [facility], please contact our office at [phone] between the hours of 8:30 a.m. and 4:30 p.m.  Voicemails left after 4:30 p.m. will  not be returned until the following business day.  For prescription refill requests, have your pharmacy contact our office.         Resources For Cancer Patients and their Caregivers ? American Cancer Society: Can assist with transportation, wigs, general needs, runs Look Good Feel Better.        (325)444-5194 ? Cancer Care: Provides financial assistance, online support groups, medication/co-pay assistance.  1-800-813-HOPE (706)470-2332) ? [place] Assists [place] cancer patients and their families through emotional , educational and financial support.  (506) 182-8643 ? [place] DSS Where to apply for food stamps, Medicaid and utility assistance. 505-459-3954 ? [facility]: Transportation to medical appointments. (270)380-3556 ? Social Security Administration: May apply for disability if have a Stage IV cancer. 424-620-1063 417-197-6644 ? [employer], Disability and Transit Services: Assists with nutrition, care and transit needs. 440-706-6152

## 2015-09-20 ENCOUNTER — Ambulatory Visit (HOSPITAL_COMMUNITY)
Admission: RE | Admit: 2015-09-20 | Discharge: 2015-09-20 | Disposition: A | Discharge status: Home / Self Care | Payer: Medicaid Other | Source: Ambulatory Visit | Attending: Hematology & Oncology | Admitting: Hematology & Oncology

## 2015-09-20 ENCOUNTER — Ambulatory Visit (HOSPITAL_COMMUNITY): Payer: Self-pay

## 2015-09-20 DIAGNOSIS — Z1231 Encounter for screening mammogram for malignant neoplasm of breast: Secondary | ICD-10-CM | POA: Insufficient documentation

## 2015-09-21 ENCOUNTER — Ambulatory Visit (HOSPITAL_COMMUNITY): Payer: Medicaid Other | Admitting: Physical Therapy

## 2015-09-21 ENCOUNTER — Encounter (HOSPITAL_COMMUNITY): Payer: Self-pay | Admitting: Physical Therapy

## 2015-09-21 DIAGNOSIS — I890 Lymphedema, not elsewhere classified: Secondary | ICD-10-CM

## 2015-09-21 DIAGNOSIS — R1310 Dysphagia, unspecified: Secondary | ICD-10-CM | POA: Diagnosis not present

## 2015-09-21 NOTE — Therapy (Signed)
[place], [place], [address] Phone: [phone]   Fax:  [phone]  Physical Therapy Evaluation  Patient Details  Name: Shelby Larson MRN: [id number] Date of Birth: 1966-01-11 Referring Provider: Dr. Whitney Muse  Encounter Date: 09/21/2015      PT End of Session - 09/21/15 1108    Visit Number 1   Number of Visits 4   Date for PT Re-Evaluation 11/16/15   PT Start Time 1000   PT Stop Time 1100   PT Time Calculation (min) 60 min   Activity Tolerance Patient tolerated treatment well   Behavior During Therapy [facility] for tasks assessed/performed      Past Medical History  Diagnosis Date  . Heart murmur     as a child  . Chronic bronchitis ([place])   . Anxiety     panic attacks  . Depression   . Headache     onset a few months ago  . Neuropathy ([place])     had it in both hands  . Arthritis   . Fibromyalgia   . Anemia   . Rosacea   . Hypercholesterolemia   . Asthma   . COPD (chronic obstructive pulmonary disease) ([place])   . Seizures ([place])     takes Gabapentin  (last one in 2010); head trauma caused seizures.  . Malignant neoplasm of base of tongue ([place]) 2016    Base of tongue with neck metastases    Past Surgical History  Procedure Laterality Date  . Wisdom tooth extraction    . Tonsillectomy Left 08/20/2014    Procedure: TONSILLECTOMY;  Surgeon: Ruby Cola, MD;  Location: [facility] OR;  Service: ENT;  Laterality: Left;  . Radical neck dissection Left 08/20/2014    Procedure: RADICAL LEFT NECK DISSECTION ;  Surgeon: Ruby Cola, MD;  Location: [place];  Service: ENT;  Laterality: Left;  . Tracheostomy tube placement N/A 08/20/2014    Procedure: TRACHEOSTOMY - AWAKE;  Surgeon: Ruby Cola, MD;  Location: [place];  Service: ENT;  Laterality: N/A;  . Gastrostomy tube placement    . Multiple extractions with alveoloplasty N/A 09/17/2014    Procedure: Extraction of tooth #'s [id number] with alveoloplasty,  mandibular left lingual torus reduction, and gross debridement of remaining teeth.;  Surgeon: Lenn Cal, DDS;  Location: [place];  Service: Oral Surgery;  Laterality: N/A;  . Portacath placement    . Colonoscopy with propofol N/A 04/15/2015    RMR: colonic polyps removed as described above.,  . Polypectomy N/A 04/15/2015    Procedure: POLYPECTOMY;  Surgeon: Daneil Dolin, MD;  Location: [facility];  Service: Endoscopy;  Laterality: N/A;  . Esophagogastroduodenoscopy (egd) with propofol N/A 07/19/2015    RMR: poorly postitioned PEG tube. Status post removal and replacement of a small bowel video capsule  . Givens capsule study N/A 07/19/2015    Procedure: GIVENS CAPSULE STUDY;  Surgeon: Daneil Dolin, MD;  [facility];  Service: Endoscopy;  Laterality: N/A;    There were no vitals filed for this visit.  Visit Diagnosis:  Lymphedema, not elsewhere classified      Subjective Assessment - 09/21/15 1013    Subjective I want to learn some throat massages to keep the swelling down and make it easier to breathe. I want to keep the jowels away.   Pertinent History Shelby Larson 50 y.o. 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 She has completed concurrent XRT/cisplatin. Pt is getting a biopsy of a mass in her lung next week   Patient Stated Goals to get swelling down    Currently in Pain? No/denies   Pain Score 0-No pain            [facility] PT Assessment - 09/21/15 0001    Assessment   Medical Diagnosis base of tongue cancer   Referring Provider Dr. Whitney Muse   Onset Date/Surgical Date 06/26/14   Hand Dominance Right   Prior Therapy speech therapy this year for swallowing   Precautions   Precautions None   Restrictions   Weight Bearing  Restrictions No   Balance Screen   Has the patient fallen in the past 6 months No   Has the patient had a decrease in activity level because of a fear of falling?  No   Is the patient reluctant to leave their home because of a fear of falling?  No   Home Environment   Living Environment Private residence   Living Arrangements Parent  with mother   Available Help at Discharge Friend(s)   Type of [place] Access Level entry   Entrance Stairs-Number of Steps 0   Home Layout Two level;Bed/bath upstairs   Alternate Level Stairs-Number of Steps 14   Alternate Level Stairs-Rails Right   Home Equipment [place] - 4 wheels;Wheelchair - manual   Prior Function   Level of Independence Independent   Vocation --  caregiver   Vocation Requirements cleaning, driving to appointments, cooking   Leisure pt does not regularly exercise   Cognition   Overall Cognitive Status Within Functional Limits for tasks assessed   Special Tests    Special Tests --  LLIS- 57 percent impairment           LYMPHEDEMA/ONCOLOGY QUESTIONNAIRE - 09/21/15 1119    Type   Cancer Type base of tongue cancer   Date Lymphedema/Swelling Started   Date 07/27/14  pt unsure of when exactly it began   Treatment   Active Chemotherapy Treatment No   Past Chemotherapy Treatment Yes   Date 11/25/14  this is approx per pt   Active Radiation Treatment No   Past Radiation Treatment Yes   Date 11/25/14  per pt approximation   Body Site neck   What other symptoms do you have   Are you Having Heaviness or Tightness Yes   Are you having Pain Yes  sometimes   Are you having pitting edema No   Is it Hard or Difficult finding clothes that fit No   Do you have infections No   Is there Decreased scar mobility No   Lymphedema Assessments   Lymphedema Assessments Head and Neck   Head and Neck   Right Corner of mouth to where ear lobe meets face 9.5 cm   Left Corner of mouth to where ear lobe meets face 9.5 cm   4 cm  superior to sternal notch around neck 29.2 cm   6 cm superior to sternal notch around neck 31.5 cm   8 cm superior to sternal notch around neck 33.2 cm                [facility] Adult PT Treatment/Exercise - 09/21/15 0001    Manual Therapy   Manual Therapy Edema management   Manual therapy comments constructed  chip bag and educated pt on use for management of anterior neck edema                PT Education - 09/21/15 1108    Education provided Yes   Education Details self MLD, cervical ROM exercises, FlexiTouch compression pump   Person(s) Educated Patient   Methods Explanation;Demonstration   Comprehension Verbalized understanding             PT Long Term Goals - 09/21/15 1113    PT LONG TERM GOAL #1   Title Pt will demonstrate independence with self manual lymphatic drainage for long term management of edema.    Baseline na   Time 4   Period Weeks   Status New   PT LONG TERM GOAL #2   Title Pt will be independent in a home exercise program for improving lymphatic flow   Baseline na   Time 8   Period Weeks   Status New   PT LONG TERM GOAL #3   Title Pt to receive trial of FlexiTouch compression pump for management of head and neck edema   Time 4   Period Weeks   Status New               Plan - 09/21/15 1110    Clinical Impression Statement TKEYAH RAZZAQ 50 y.o. 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 She has completed concurrent XRT/cisplatin. She states she has been having swelling since the surgery and it makes it hard for her to breathe. Anterior neck is very fibrotic. Pt would benefit manual lymphatic drainage and a compression pump trial for long term management of edema.     Pt will benefit from skilled  therapeutic intervention in order to improve on the following deficits Pain;Increased fascial restricitons;Increased edema   Rehab Potential Good   PT Frequency 1x / week   PT Duration 4 weeks  once pt receives authorization   PT Treatment/Interventions Manual lymph drainage;Manual techniques;Vasopneumatic Device;Therapeutic exercise   PT Next Visit Plan assess for independence with HEP and self MLD   PT Home Exercise Plan cervical ROM exercises   Recommended Other Services trial of FlexiTouch compression pump   Consulted and Agree with Plan of Care Patient         Problem List Patient Active Problem List   Diagnosis Date Noted  . History of colonic polyps   . Anemia 03/23/2015  . 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 Therapeutic opioid induced constipation 09/16/2014  . 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 Malignant neoplasm of base of tongue ([place]) 09/02/2014  . 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 [place], [place], [address] Phone: [phone]   Fax:  [phone]  Name: DESIAH RENDELL MRN: [id number] Date of Birth: 01-27-1966   Allyson Sabal, PT 09/21/2015 11:26 AM

## 2015-09-21 NOTE — Patient Instructions (Addendum)
Manual Lymph Drainage for face/neck   1) Place hands on areas just above collar bones and do 15-20 stationary circles 2) Place hands on either side of neck and stretch skin back and down 3) Begin moving forward under jaw line stretching skin backwards, once you get to the middle work your way back to your neck continuing to stretch skin backwards 4) Move down to the middle part of your neck and repeat step 3 on middle part of throat 5) Move to the base of the neck and repeat step 3 at bottom of neck      Do not slide on the skin Only give enough pressure to stretch the skin Make sure to always wash your hands prior to massage  Neck Range of Motion Exercises  1. Look down and up x 10 2. Look over left shoulder then over right shoulder x 10 3. Bring left ear to left shoulder then repeat on right side x 10

## 2015-09-23 ENCOUNTER — Encounter (HOSPITAL_BASED_OUTPATIENT_CLINIC_OR_DEPARTMENT_OTHER): Payer: Medicaid Other

## 2015-09-23 ENCOUNTER — Encounter (HOSPITAL_COMMUNITY): Payer: Medicaid Other

## 2015-09-23 VITALS — BP 109/80 | HR 99 | Temp 97.8°F | Resp 20

## 2015-09-23 DIAGNOSIS — N189 Chronic kidney disease, unspecified: Secondary | ICD-10-CM

## 2015-09-23 DIAGNOSIS — N186 End stage renal disease: Secondary | ICD-10-CM

## 2015-09-23 DIAGNOSIS — C01 Malignant neoplasm of base of tongue: Secondary | ICD-10-CM

## 2015-09-23 DIAGNOSIS — D631 Anemia in chronic kidney disease: Secondary | ICD-10-CM | POA: Diagnosis not present

## 2015-09-23 DIAGNOSIS — Z95828 Presence of other vascular implants and grafts: Secondary | ICD-10-CM

## 2015-09-23 LAB — CBC WITH DIFFERENTIAL/PLATELET
BASOS ABS: 0 10*3/uL (ref 0.0–0.1)
BASOS PCT: 0 %
EOS ABS: 0.1 10*3/uL (ref 0.0–0.7)
EOS PCT: 1 %
HCT: 30.5 % — ABNORMAL LOW (ref 36.0–46.0)
HEMOGLOBIN: 10.3 g/dL — AB (ref 12.0–15.0)
LYMPHS ABS: 0.5 10*3/uL — AB (ref 0.7–4.0)
Lymphocytes Relative: 6 %
MCH: 31.8 pg (ref 26.0–34.0)
MCHC: 33.8 g/dL (ref 30.0–36.0)
MCV: 94.1 fL (ref 78.0–100.0)
Monocytes Absolute: 1.2 10*3/uL — ABNORMAL HIGH (ref 0.1–1.0)
Monocytes Relative: 14 %
NEUTROS PCT: 79 %
Neutro Abs: 7 10*3/uL (ref 1.7–7.7)
PLATELETS: 347 10*3/uL (ref 150–400)
RBC: 3.24 MIL/uL — AB (ref 3.87–5.11)
RDW: 14.4 % (ref 11.5–15.5)
WBC: 8.8 10*3/uL (ref 4.0–10.5)

## 2015-09-23 LAB — BASIC METABOLIC PANEL
ANION GAP: 10 (ref 5–15)
BUN: 22 mg/dL — ABNORMAL HIGH (ref 6–20)
CHLORIDE: 91 mmol/L — AB (ref 101–111)
CO2: 29 mmol/L (ref 22–32)
Calcium: 8.7 mg/dL — ABNORMAL LOW (ref 8.9–10.3)
Creatinine, Ser: 1.38 mg/dL — ABNORMAL HIGH (ref 0.44–1.00)
GFR, EST AFRICAN AMERICAN: 51 mL/min — AB (ref >60)
GFR, EST NON AFRICAN AMERICAN: 44 mL/min — AB (ref >60)
Glucose, Bld: 105 mg/dL — ABNORMAL HIGH (ref 65–99)
POTASSIUM: 4.5 mmol/L (ref 3.5–5.1)
SODIUM: 130 mmol/L — AB (ref 135–145)

## 2015-09-23 MED ORDER — DARBEPOETIN ALFA 60 MCG/0.3ML IJ SOSY
60.0000 ug | PREFILLED_SYRINGE | Freq: Once | INTRAMUSCULAR | Status: AC
  Administered 2015-09-23: 60 ug via SUBCUTANEOUS
  Filled 2015-09-23: qty 0.3

## 2015-09-23 MED ORDER — HEPARIN SOD (PORK) LOCK FLUSH 100 UNIT/ML IV SOLN
500.0000 [IU] | Freq: Once | INTRAVENOUS | Status: AC
  Administered 2015-09-23: 500 [IU] via INTRAVENOUS
  Filled 2015-09-23: qty 5

## 2015-09-23 MED ORDER — SODIUM CHLORIDE 0.9% FLUSH
10.0000 mL | Freq: Once | INTRAVENOUS | Status: AC
  Administered 2015-09-23: 10 mL via INTRAVENOUS

## 2015-09-23 NOTE — Progress Notes (Signed)
Shelby Larson presented for Portacath access and flush. Proper placement of portacath confirmed by CXR. Portacath located right chest wall accessed with  H 20 needle. Good blood return present. Portacath flushed with 51ml NS and 500U/85ml Heparin and needle removed intact. Procedure without incident. Patient tolerated procedure well.   Shelby Larson presents today for injection per MD orders. Aranesp 60 mcg administered SQ in right Abdomen. Administration without incident. Patient tolerated well.

## 2015-09-23 NOTE — Patient Instructions (Signed)
[place] at [facility] Discharge Instructions  RECOMMENDATIONS MADE BY THE CONSULTANT AND ANY TEST RESULTS WILL BE SENT TO YOUR REFERRING PHYSICIAN.  Hemoglobin 10.3. Aranesp 60 mcg injection given as ordered.  Thank you for choosing [place] at [facility] to provide your oncology and hematology care.  To afford each patient quality time with our provider, please arrive at least 15 minutes before your scheduled appointment time.   Beginning January 23rd 2017 lab work for the [facility] will be done in the  Main lab at [facility] on 1st floor. If you have a lab appointment with the [facility] please come in thru the  Main Entrance and check in at the main information desk  You need to re-schedule your appointment should you arrive 10 or more minutes late.  We strive to give you quality time with our providers, and arriving late affects you and other patients whose appointments are after yours.  Also, if you no show three or more times for appointments you may be dismissed from the clinic at the providers discretion.     Again, thank you for choosing [facility].  Our hope is that these requests will decrease the amount of time that you wait before being seen by our physicians.       _____________________________________________________________  Should you have questions after your visit to [facility], please contact our office at [phone] between the hours of 8:30 a.m. and 4:30 p.m.  Voicemails left after 4:30 p.m. will not be returned until the following business day.  For prescription refill requests, have your pharmacy contact our office.         Resources For Cancer Patients and their Caregivers ? American Cancer Society: Can assist with transportation, wigs, general needs, runs Look Good Feel Better.        253-185-8755 ? Cancer Care: Provides financial assistance, online support groups,  medication/co-pay assistance.  1-800-813-HOPE (706) 045-6548) ? [facility] Assists [place] cancer patients and their families through emotional , educational and financial support.  (941) 109-0217 ? [place] DSS Where to apply for food stamps, Medicaid and utility assistance. 838-294-7281 ? [facility]: Transportation to medical appointments. 443-630-9197 ? Social Security Administration: May apply for disability if have a Stage IV cancer. 580-827-3762 979-200-7611 ? [employer], Disability and Transit Services: Assists with nutrition, care and transit needs. (989)832-8900

## 2015-09-28 ENCOUNTER — Other Ambulatory Visit: Payer: Self-pay | Admitting: *Deleted

## 2015-09-28 ENCOUNTER — Encounter: Payer: Self-pay | Admitting: Thoracic Surgery (Cardiothoracic Vascular Surgery)

## 2015-09-28 ENCOUNTER — Institutional Professional Consult (permissible substitution) (INDEPENDENT_AMBULATORY_CARE_PROVIDER_SITE_OTHER): Payer: Medicaid Other | Admitting: Thoracic Surgery (Cardiothoracic Vascular Surgery)

## 2015-09-28 VITALS — BP 119/83 | HR 96 | Resp 20 | Ht 66.0 in | Wt 105.0 lb

## 2015-09-28 DIAGNOSIS — J9859 Other diseases of mediastinum, not elsewhere classified: Secondary | ICD-10-CM

## 2015-09-28 DIAGNOSIS — C029 Malignant neoplasm of tongue, unspecified: Secondary | ICD-10-CM

## 2015-09-28 DIAGNOSIS — R590 Localized enlarged lymph nodes: Secondary | ICD-10-CM

## 2015-09-28 DIAGNOSIS — R918 Other nonspecific abnormal finding of lung field: Secondary | ICD-10-CM

## 2015-09-28 NOTE — Progress Notes (Signed)
PCP is [name], MD Referring Provider is Baird Cancer, PA-C  Chief Complaint  Patient presents with  . Lung Mass    Surgical eval for BX, PET Scan 09/15/15, HX of tongue cancer    HPI: Shelby Larson is a 50 year old woman sent for consultation for possible biopsy.  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 She was treated with radiation and chemotherapy and also had a left neck dissection in February 2016. She had a tracheostomy, but that has since been removed.  Back in December she had pneumonia with a large cavitary "mass" in the left lung.  She recently had a PET/CT which showed multiple hypermetabolic hilar and mediastinal nodes and also a hypermetabolic anterior mediastinal mass. This is suspicious for recurrence.  She denies any history of cardiac problems. She does continue to smoke about one half a pack a cigarettes daily. She has issues with swallowing and altered taste of food. She's lost about 5 pounds over the past 3 months.  Shelby Larson 50 y.o. 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 She has completed concurrent XRT/cisplatin.   Zubrod Score: At the time of surgery this patient's most appropriate activity status/level should be described as: []     0    Normal activity, no symptoms [x]     1    Restricted in physical strenuous activity but ambulatory, able to do out light work []     2    Ambulatory and capable of self care, unable to do work activities, up and about >50 % of waking hours                              []     3    Only limited self care, in bed greater than 50% of waking hours []     4    Completely disabled, no self care, confined to bed  or chair []     5    Moribund   Past Medical History  Diagnosis Date  . Heart murmur     as a child  . Chronic bronchitis ([place])   . Anxiety     panic attacks  . Depression   . Headache     onset a few months ago  . Neuropathy ([place])     had it in both hands  . Arthritis   . Fibromyalgia   . Anemia   . Rosacea   . Hypercholesterolemia   . Asthma   . COPD (chronic obstructive pulmonary disease) ([place])   . Seizures ([place])     takes Gabapentin  (last one in 2010); head trauma caused seizures.  . Malignant neoplasm of base of tongue ([place]) 2016    Base of tongue with neck metastases    Past Surgical History  Procedure Laterality Date  . Wisdom tooth extraction    . Tonsillectomy Left 08/20/2014    Procedure: TONSILLECTOMY;  Surgeon: Ruby Cola, MD;  Location: [facility];  Service: ENT;  Laterality: Left;  . Radical neck dissection Left 08/20/2014    Procedure: RADICAL LEFT NECK DISSECTION ;  Surgeon: Ruby Cola, MD;  Location: [facility];  Service: ENT;  Laterality:  Left;  . Tracheostomy tube placement N/A 08/20/2014    Procedure: TRACHEOSTOMY - AWAKE;  Surgeon: Ruby Cola, MD;  Location: [facility];  Service: ENT;  Laterality: N/A;  . Gastrostomy tube placement    . Multiple extractions with alveoloplasty N/A 09/17/2014    Procedure: Extraction of tooth #'s [id number] with alveoloplasty, mandibular left lingual torus reduction, and gross debridement of remaining teeth.;  Surgeon: Lenn Cal, DDS;  Location: [facility];  Service: Oral Surgery;  Laterality: N/A;  . Portacath placement    . Colonoscopy with propofol N/A 04/15/2015    RMR: colonic polyps removed as described above.,  . Polypectomy N/A 04/15/2015    Procedure: POLYPECTOMY;  Surgeon: Daneil Dolin, MD;  [facility];  Service: Endoscopy;  Laterality: N/A;  . Esophagogastroduodenoscopy (egd) with propofol N/A 07/19/2015    RMR: poorly postitioned PEG tube. Status post removal and replacement of a small bowel video  capsule  . Givens capsule study N/A 07/19/2015    Procedure: GIVENS CAPSULE STUDY;  Surgeon: Daneil Dolin, MD;  [facility];  Service: Endoscopy;  Laterality: N/A;    Family History  Problem Relation Age of Onset  . Neuropathy Mother   . Hypertension Mother   . Alcoholism Father   . Alcoholism Sister     Social History Social History  Substance Use Topics  . Smoking status: Current Every Day Smoker -- 0.50 packs/day for 20 years    Types: Cigarettes    Last Attempt to Quit: 06/18/2014  . Smokeless tobacco: Never Used     Comment: Per pt she restarted 2016 as of 08-17-15  . Alcohol Use: 0.0 oz/week    0 Standard drinks or equivalent per week     Comment: occasional, 09/02/14 no longer using, per pt no 08-17-15    Current Outpatient Prescriptions  Medication Sig Dispense Refill  . bacitracin 500 UNIT/GM ointment Apply 1 application topically 2 (two) times daily. 28 g 0  . BIOTIN 5000 PO Take 10,000 mg by mouth daily.    . cevimeline (EVOXAC) 30 MG capsule Take 1 capsule (30 mg total) by mouth 3 (three) times daily. 90 capsule 2  . cyclobenzaprine (FLEXERIL) 10 MG tablet Take 1 tablet (10 mg total) by mouth 3 (three) times daily as needed for muscle spasms. 30 tablet 1  . DULoxetine (CYMBALTA) 30 MG capsule Take 1 capsule (30 mg total) by mouth daily. Taking 2 tablets in AM 60 capsule 0  . fentaNYL (DURAGESIC - DOSED MCG/HR) 100 MCG/HR Place 1 patch (100 mcg total) onto the skin every 3 (three) days. 10 patch 0  . fluticasone (FLONASE) 50 MCG/ACT nasal spray Place 2 sprays into both nostrils daily. (Patient taking differently: Place 2 sprays into both nostrils daily as needed. ) 16 g 3  . gabapentin (NEURONTIN) 600 MG tablet Take 600 mg by mouth 3 (three) times daily.    Marland Kitchen HYDROcodone-acetaminophen (NORCO) 10-325 MG tablet Take 1 tablet by mouth every 6 (six) hours as needed. 60 tablet 0  . levothyroxine (LEVOTHROID) 25 MCG tablet Take 1 tablet (25 mcg total) by mouth daily  before breakfast. 30 tablet 1  . LORazepam (ATIVAN) 2 MG tablet Take 1 tablet (2 mg total) by mouth every 6 (six) hours as needed for anxiety. 30 tablet 1  . Nutritional Supplements (FEEDING SUPPLEMENT, JEVITY 1.5 CAL/FIBER,) LIQD 5 cans. Nocturnal feed. Rate100 ml/hr x 12 hours. Provides. 1778 kcals, 76 g pro, 900 mls fluid.  Flush 180 mls 5x  a day.    . prochlorperazine (COMPAZINE) 10 MG tablet Take 1 tablet (10 mg total) by mouth every 6 (six) hours as needed for nausea or vomiting. 60 tablet 2  . QUEtiapine (SEROQUEL) 50 MG tablet Take 1 tablet (50 mg total) by mouth daily as needed (sleep). 30 tablet 1  . sodium fluoride (PREVIDENT 5000 PLUS) 1.1 % CREA dental cream Apply to tooth brush. Brush teeth for 2 minutes. Spit out excess-DO NOT swallow. Repeat nightly. 1 Tube prn  . tetrahydrozoline-zinc (VISINE-AC) 0.05-0.25 % ophthalmic solution Place 2 drops into both eyes daily as needed (dry eyes).     No current facility-administered medications for this visit.   Facility-Administered Medications Ordered in Other Visits  Medication Dose Route Frequency Provider Last Rate Last Dose  . sodium chloride 0.9 % injection 10 mL  10 mL Intravenous PRN Patrici Ranks, MD   10 mL at 03/25/15 1530  . sodium chloride 0.9 % injection 10 mL  10 mL Intravenous PRN Baird Cancer, PA-C   10 mL at 07/14/15 1200    Allergies  Allergen Reactions  . Bee Venom Swelling  . Penicillins Other (See Comments)    Has patient had a PCN reaction causing immediate rash, facial/tongue/throat swelling, SOB or lightheadedness with hypotension: unknown Has patient had a PCN reaction causing severe rash involving mucus membranes or skin necrosis: unknown Has patient had a PCN reaction that required hospitalization unknown Has patient had a PCN reaction occurring within the last 10 years: unknown If all of the above answers are "NO", then may proceed with Cephalosporin use.  . Latex Swelling and Rash    Review of  Systems  Constitutional: Positive for activity change, appetite change, fatigue and unexpected weight change (Lost 5 pounds in 3 months). Negative for fever.  HENT: Positive for dental problem and trouble swallowing.   Respiratory: Positive for cough.        Pleuritic chest pain  Musculoskeletal: Positive for myalgias and arthralgias.  Skin: Positive for rash.  Neurological: Positive for seizures and numbness.  Hematological: Bruises/bleeds easily (Bruises easily, no history of excessive bleeding).  Psychiatric/Behavioral: Positive for dysphoric mood. The patient is nervous/anxious.     BP 119/83 mmHg  Pulse 96  Resp 20  Ht 5\' 6"  (1.676 m)  Wt 105 lb (47.628 kg)  BMI 16.96 kg/m2  SpO2 89%  LMP 07/15/2014 Physical Exam  Constitutional: She is oriented to person, place, and time. No distress.  Thin  HENT:  Head: Normocephalic.  Eyes: Conjunctivae and EOM are normal. No scleral icterus.  Neck: Normal range of motion.  Well-healed surgical scar from previous neck dissection  Cardiovascular: Normal rate, regular rhythm and normal heart sounds.   No murmur heard. Pulmonary/Chest: Effort normal and breath sounds normal. She has no wheezes. She has no rales.  Abdominal: Soft. She exhibits no distension. There is no tenderness.  Musculoskeletal: She exhibits no edema.  Neurological: She is alert and oriented to person, place, and time. No cranial nerve deficit.  Skin: Skin is warm and dry.  Psychiatric:  Anxious  Vitals reviewed.    Diagnostic Tests: NUCLEAR MEDICINE PET SKULL BASE TO THIGH  TECHNIQUE: 5.36 mCi F-18 FDG was injected intravenously. Full-ring PET imaging was performed from the skull base to thigh after the radiotracer. CT data was obtained and used for attenuation correction and anatomic localization.  FASTING BLOOD GLUCOSE: Value: 90 mg/dl  COMPARISON: CT chest dated 06/08/2015. PET-CT dated 02/22/2015.  FINDINGS: NECK  No hypermetabolic  lymph  nodes in the neck.  CHEST  2.2 x 4.2 cm soft tissue mass along the anterior aspect of the heart/medial aspect of the anterior left upper lobe (series 4/image 65), new from recent prior CT, max SUV 24.0.  Additional 4.7 x 1.7 cm soft tissue lesion along the left heart border/medial aspect of the left upper lobe (series 4/image 79), new from recent prior CT, max SUV 14.9.  These are both worrisome for pleural-based or less likely pericardial metastases.  Additional residual soft tissue nodularity along the left fissure measuring 1.4 x 1.7 cm (series 4/ image 76), max SUV 11.3. Associated cavitary masslike opacity with surrounding ground-glass opacity in the left upper and lower lobes has essentially resolved, suggesting an infectious etiology. As such, it is unclear whether this is related to residual infection or possibly additional pleural-based tumor.  Additional scattered thin-walled cavitary lesions with scarring in the lingula and left lower lobe, related to prior infection. No pleural effusion or pneumothorax.  The heart is normal in size. No pericardial effusion. Right chest port terminates at the cavoatrial junction.  Hypermetabolic thoracic lymphadenopathy, including:  --6 mm short axis prevascular node (series 4/ image 50), max SUV 6.0  --7 mm short axis high right paratracheal node (series 4/ image 48), max SUV 4.8  --12 mm short axis subcarinal node (series 4/image 57), max SUV 9.9  --Hypermetabolic focus in the left hilum, max SUV 8.4  --Mild hypermetabolism in the right hilum, max SUV 4.7  ABDOMEN/PELVIS  No abnormal hypermetabolic activity within the liver, pancreas, adrenal glands, or spleen.  Gastrostomy in satisfactory position. Atherosclerotic calcifications of the abdominal aorta and branch vessels. 3 mm nonobstructing right upper pole renal calculus.  No hypermetabolic lymph nodes in the abdomen or pelvis.  SKELETON  No  focal hypermetabolic activity to suggest skeletal metastasis.  IMPRESSION: 2.2 x 4.2 cm soft tissue mass along the anterior aspect of the heart and 1.7 x 4.7 cm soft tissue mass along the left heart border, suspicious for pleural-based or less likely pericardial metastases, new.  Prior left upper and lower lobe patchy opacity has essentially resolved, likely infectious. Residual 1.4 x 1.7 cm pleural-based nodularity along the left fissure may reflect additional tumor versus residual infection.  Hypermetabolic thoracic lymphadenopathy, suspicious for nodal metastases, as above.   Electronically Signed  By: Julian Hy M.D.  On: 09/15/2015 15:55   Impression: Mr. Keup is a 50 year old woman with a history of tobacco abuse with a history of cancer of the base of the tongue. She now has multiple findings on a PET/CT further suspicious for a recurrence. She did have pneumonia back in December and it was quite an impressive CT scan at that time. There is a small possibility that her adenopathy could be associated with that and be reactive in nature. However, I think malignancy is more likely. There also is a possibility that we're dealing with a different malignancy such as a lymphoma.  I recommended that we proceed with bronchoscopy and endobronchial ultrasound and possible left anterior mediastinotomy Shelby Larson procedure). We would attempt to obtain a diagnosis in the least invasive way possible. She understands that this would be done in the operating room under general anesthesia. We would do bronchoscopy and endobronchial ultrasound via endotracheal tube. We would then wait for those results before deciding whether to proceed with the [facility] procedure. If the nodes show squamous cell carcinoma there would be no reason to do the [place].  I described the procedure to her. She  understands this would be done under general anesthesia, but most likely on an  outpatient basis. I reviewed the indications, risks, benefits, and alternatives. She understands this is diagnostic and not therapeutic. She understands the risk include those associated with general anesthesia. She understands the risk include, but are not limited to death, MI, DVT, PE, bleeding, pneumothorax, and failure to make a diagnosis. She also understands there is a possibility of other unforeseeable complications.  She accepts the risks and wishes to proceed.  Plan: Bronchoscopy, endobronchial ultrasound, and possible left anterior mediastinotomy on Thursday 10/07/2015  I spent 30 minutes with Shelby Larson during this visit, greater than 50% of the time was spent in counseling.  Melrose Nakayama, MD [facility] [phone]

## 2015-09-29 ENCOUNTER — Other Ambulatory Visit (HOSPITAL_COMMUNITY): Payer: Self-pay

## 2015-09-30 ENCOUNTER — Ambulatory Visit (HOSPITAL_COMMUNITY): Payer: Medicaid Other | Attending: Hematology & Oncology | Admitting: Physical Therapy

## 2015-09-30 ENCOUNTER — Encounter (HOSPITAL_COMMUNITY): Payer: Self-pay | Admitting: Physical Therapy

## 2015-09-30 DIAGNOSIS — I890 Lymphedema, not elsewhere classified: Secondary | ICD-10-CM | POA: Insufficient documentation

## 2015-09-30 NOTE — Therapy (Signed)
[place], [place], [address] Phone: [phone]   Fax:  [phone]  Physical Therapy Treatment  Patient Details  Name: Shelby Larson MRN: [id number] Date of Birth: 11-18-1965 Referring Provider: Dr. Whitney Muse  Encounter Date: 09/30/2015      PT End of Session - 09/30/15 1107    Visit Number 2   Number of Visits 4   Date for PT Re-Evaluation 11/16/15   PT Start Time 1016   PT Stop Time 1055   PT Time Calculation (min) 39 min   Activity Tolerance Patient tolerated treatment well   Behavior During Therapy [facility] for tasks assessed/performed      Past Medical History  Diagnosis Date  . Heart murmur     as a child  . Chronic bronchitis ([place])   . Anxiety     panic attacks  . Depression   . Headache     onset a few months ago  . Neuropathy ([place])     had it in both hands  . Arthritis   . Fibromyalgia   . Anemia   . Rosacea   . Hypercholesterolemia   . Asthma   . COPD (chronic obstructive pulmonary disease) ([place])   . Seizures ([place])     takes Gabapentin  (last one in 2010); head trauma caused seizures.  . Malignant neoplasm of base of tongue ([place]) 2016    Base of tongue with neck metastases    Past Surgical History  Procedure Laterality Date  . Wisdom tooth extraction    . Tonsillectomy Left 08/20/2014    Procedure: TONSILLECTOMY;  Surgeon: Ruby Cola, MD;  Location: [facility];  Service: ENT;  Laterality: Left;  . Radical neck dissection Left 08/20/2014    Procedure: RADICAL LEFT NECK DISSECTION ;  Surgeon: Ruby Cola, MD;  Location: [facility];  Service: ENT;  Laterality: Left;  . Tracheostomy tube placement N/A 08/20/2014    Procedure: TRACHEOSTOMY - AWAKE;  Surgeon: Ruby Cola, MD;  Location: [facility];  Service: ENT;  Laterality: N/A;  . Gastrostomy tube placement    . Multiple extractions with alveoloplasty N/A 09/17/2014    Procedure: Extraction of tooth #'s [id number] with alveoloplasty,  mandibular left lingual torus reduction, and gross debridement of remaining teeth.;  Surgeon: Lenn Cal, DDS;  Location: [facility];  Service: Oral Surgery;  Laterality: N/A;  . Portacath placement    . Colonoscopy with propofol N/A 04/15/2015    RMR: colonic polyps removed as described above.,  . Polypectomy N/A 04/15/2015    Procedure: POLYPECTOMY;  Surgeon: Daneil Dolin, MD;  [facility];  Service: Endoscopy;  Laterality: N/A;  . Esophagogastroduodenoscopy (egd) with propofol N/A 07/19/2015    RMR: poorly postitioned PEG tube. Status post removal and replacement of a small bowel video capsule  . Givens capsule study N/A 07/19/2015    Procedure: GIVENS CAPSULE STUDY;  Surgeon: Daneil Dolin, MD;  [facility];  Service: Endoscopy;  Laterality: N/A;    There were no vitals filed for this visit.  Visit Diagnosis:  Lymphedema, not elsewhere classified      Subjective Assessment - 09/30/15 1019    Subjective Pt states she can not tell a difference with use of chip bag. She has been doing the exercises but can not tell any difference in her swelling.    Pertinent History GOLDEN HAVARD 50 y.o. female returns for followup of Stage IVA, HPV-, Moderately differentiated squamous cell carcinoma  of the base of the tongue, S/P left neck dissection with sparing of 11th cranial nerve, sternocleidomastoid muscle, internal jugular vein biopsy , tracheotomy on 07/31/2014. She has completed concurrent XRT/cisplatin. Pt is getting a biopsy of a mass in her lung next week   Patient Stated Goals to get swelling down    Currently in Pain? No/denies   Pain Score 0-No pain                         [facility] Adult PT Treatment/Exercise - 09/30/15 0001    Manual Therapy   Manual Therapy Manual Lymphatic Drainage (MLD)   Manual therapy comments short neck, lateral neck then working medially towards center of throat moving fluid laterally and back down towards area behind  clavicle                PT Education - 09/30/15 1107    Education provided Yes   Education Details self MLD   [employer]) Educated Patient   Methods Explanation;Demonstration;Tactile cues;Verbal cues   Comprehension Verbalized understanding             PT Long Term Goals - 09/30/15 1111    PT LONG TERM GOAL #1   Title Pt will demonstrate independence with self manual lymphatic drainage for long term management of edema.    Baseline pt instructed in correct self MLD technique and pt verbalized understanding   Status Achieved   PT LONG TERM GOAL #2   Title Pt will be independent in a home exercise program for improving lymphatic flow   Baseline pt is now independent in her cervical ROM exercises   Status Achieved   PT LONG TERM GOAL #3   Title Pt to receive trial of FlexiTouch compression pump for management of head and neck edema   Baseline Pt received trial this visit   Status Achieved               Plan - 09/30/15 1108    Clinical Impression Statement Pt received trial of flexitouch head and neck compression pump at the end of the session. Assessed pt's ability to perform self MLD and pt was moving too quickly and using too much pressure. Re educated pt on correct way to perform self MLD through tactile and verbal cues. Pt verbalized understand at end of session. Pt to be discharged this visit. She has the tools now to self manage her edema.    Pt will benefit from skilled therapeutic intervention in order to improve on the following deficits Pain;Increased fascial restricitons;Increased edema   Rehab Potential Good   PT Frequency 1x / week   PT Duration 4 weeks   PT Treatment/Interventions Manual lymph drainage;Manual techniques;Vasopneumatic Device;Therapeutic exercise   PT Next Visit Plan pt to be discharged this visit   PT Home Exercise Plan cervical ROM exercises, self MLD   Consulted and Agree with Plan of Care Patient        Problem List Patient  Active Problem List   Diagnosis Date Noted  . History of colonic polyps   . Anemia 03/23/2015  . 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 Therapeutic opioid induced constipation 09/16/2014  . 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 Malignant neoplasm of base of tongue ([place]) 09/02/2014  . 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

## 2015-10-05 ENCOUNTER — Encounter (HOSPITAL_COMMUNITY)
Admission: RE | Admit: 2015-10-05 | Discharge: 2015-10-05 | Disposition: A | Discharge status: Home / Self Care | Payer: Medicaid Other | Source: Ambulatory Visit | Attending: Thoracic Surgery (Cardiothoracic Vascular Surgery) | Admitting: Thoracic Surgery (Cardiothoracic Vascular Surgery)

## 2015-10-05 ENCOUNTER — Encounter (HOSPITAL_COMMUNITY): Payer: Self-pay

## 2015-10-05 VITALS — BP 124/80 | HR 89 | Resp 18 | Ht 66.0 in | Wt 104.0 lb

## 2015-10-05 DIAGNOSIS — R590 Localized enlarged lymph nodes: Secondary | ICD-10-CM | POA: Insufficient documentation

## 2015-10-05 DIAGNOSIS — E039 Hypothyroidism, unspecified: Secondary | ICD-10-CM | POA: Insufficient documentation

## 2015-10-05 DIAGNOSIS — R918 Other nonspecific abnormal finding of lung field: Secondary | ICD-10-CM | POA: Diagnosis not present

## 2015-10-05 DIAGNOSIS — J449 Chronic obstructive pulmonary disease, unspecified: Secondary | ICD-10-CM | POA: Diagnosis not present

## 2015-10-05 DIAGNOSIS — R05 Cough: Secondary | ICD-10-CM | POA: Insufficient documentation

## 2015-10-05 DIAGNOSIS — R222 Localized swelling, mass and lump, trunk: Secondary | ICD-10-CM | POA: Insufficient documentation

## 2015-10-05 DIAGNOSIS — J9859 Other diseases of mediastinum, not elsewhere classified: Secondary | ICD-10-CM

## 2015-10-05 DIAGNOSIS — M797 Fibromyalgia: Secondary | ICD-10-CM | POA: Diagnosis not present

## 2015-10-05 DIAGNOSIS — E7800 Pure hypercholesterolemia, unspecified: Secondary | ICD-10-CM | POA: Insufficient documentation

## 2015-10-05 DIAGNOSIS — Z85810 Personal history of malignant neoplasm of tongue: Secondary | ICD-10-CM | POA: Diagnosis not present

## 2015-10-05 DIAGNOSIS — Z01812 Encounter for preprocedural laboratory examination: Secondary | ICD-10-CM | POA: Diagnosis not present

## 2015-10-05 DIAGNOSIS — K219 Gastro-esophageal reflux disease without esophagitis: Secondary | ICD-10-CM | POA: Insufficient documentation

## 2015-10-05 DIAGNOSIS — G629 Polyneuropathy, unspecified: Secondary | ICD-10-CM | POA: Diagnosis not present

## 2015-10-05 DIAGNOSIS — Z01818 Encounter for other preprocedural examination: Secondary | ICD-10-CM | POA: Insufficient documentation

## 2015-10-05 HISTORY — DX: Gastro-esophageal reflux disease without esophagitis: K21.9

## 2015-10-05 HISTORY — DX: Hypothyroidism, unspecified: E03.9

## 2015-10-05 HISTORY — DX: Pneumonia, unspecified organism: J18.9

## 2015-10-05 HISTORY — DX: Presence of spectacles and contact lenses: Z97.3

## 2015-10-05 LAB — COMPREHENSIVE METABOLIC PANEL
ALBUMIN: 3.4 g/dL — AB (ref 3.5–5.0)
ALT: 9 U/L — ABNORMAL LOW (ref 14–54)
ANION GAP: 12 (ref 5–15)
AST: 16 U/L (ref 15–41)
Alkaline Phosphatase: 103 U/L (ref 38–126)
BILIRUBIN TOTAL: 1 mg/dL (ref 0.3–1.2)
BUN: 19 mg/dL (ref 6–20)
CO2: 28 mmol/L (ref 22–32)
Calcium: 9.3 mg/dL (ref 8.9–10.3)
Chloride: 90 mmol/L — ABNORMAL LOW (ref 101–111)
Creatinine, Ser: 1.44 mg/dL — ABNORMAL HIGH (ref 0.44–1.00)
GFR calc non Af Amer: 42 mL/min — ABNORMAL LOW (ref >60)
GFR, EST AFRICAN AMERICAN: 49 mL/min — AB (ref >60)
GLUCOSE: 91 mg/dL (ref 65–99)
POTASSIUM: 4.3 mmol/L (ref 3.5–5.1)
Sodium: 130 mmol/L — ABNORMAL LOW (ref 135–145)
TOTAL PROTEIN: 7.3 g/dL (ref 6.5–8.1)

## 2015-10-05 LAB — SURGICAL PCR SCREEN
MRSA, PCR: POSITIVE — AB
Staphylococcus aureus: POSITIVE — AB

## 2015-10-05 LAB — CBC
HCT: 29.1 % — ABNORMAL LOW (ref 36.0–46.0)
Hemoglobin: 10 g/dL — ABNORMAL LOW (ref 12.0–15.0)
MCH: 31.9 pg (ref 26.0–34.0)
MCHC: 34.4 g/dL (ref 30.0–36.0)
MCV: 93 fL (ref 78.0–100.0)
PLATELETS: 299 10*3/uL (ref 150–400)
RBC: 3.13 MIL/uL — AB (ref 3.87–5.11)
RDW: 15 % (ref 11.5–15.5)
WBC: 9.2 10*3/uL (ref 4.0–10.5)

## 2015-10-05 LAB — PROTIME-INR
INR: 1.14 (ref 0.00–1.49)
Prothrombin Time: 14.8 seconds (ref 11.6–15.2)

## 2015-10-05 LAB — APTT: aPTT: 33 seconds (ref 24–37)

## 2015-10-05 NOTE — Progress Notes (Signed)
Anesthesia Chart Review: Patient is a 50 year old female scheduled for video bronchoscopy with endobronchial U/S, possible left anterior mediastinoscopy on 10/07/15 by Dr. Roxan Hockey. Procedure recommended to definitively determine if patient has recurrent cancer of the tongue.  She has a history of SCC of the tongue base with metastasis to the left neck and underwent left tonsillectomy, left neck dissection, and awake tracheostomy 08/20/14 (now decannulated) s/p concurrent XRT/cisplatin with follow-up PET/CT further suspicious for recurrence. Other history includes gastrostomy tube 08/24/14, smoking, childhood murmur, fibromyalgia, COPD/chronic bronchitis, asthma, hypercholesterolemia, anemia, head trauma with seizures '10, depression, anxiety with panic attacks, neuropathy (hands), Rosacea, hypothyroidism, GERD, right IJ Port-a-cath 10/02/14, multiple teeth extractions 09/17/14.   Of note she reported a chronic cough with clear sputum, but two episode of coughing up a blood clot within the past 24 hours. No active hemoptysis at PAT. Her PAT RN Sherlynn Stalls to notify Dr. Leonarda Salon office.    PCP is listed as Dr. Judd Lien. HEM-ONC is Dr. Whitney Muse. GI is Dr. Gala Romney.  04/12/15 EKG: SR with short PR.  09/15/15 PET scan: IMPRESSION: - 2.2 x 4.2 cm soft tissue mass along the anterior aspect of the heart and 1.7 x 4.7 cm soft tissue mass along the left heart border, suspicious for pleural-based or less likely pericardial metastases, new. - Prior left upper and lower lobe patchy opacity has essentially resolved, likely infectious. Residual 1.4 x 1.7 cm pleural-based nodularity along the left fissure may reflect additional tumor versus residual infection. - Hypermetabolic thoracic lymphadenopathy, suspicious for nodal metastases, as above.  10/05/15 CXR: IMPRESSION: Pleural-based soft tissue nodules left mid and lower lung appears slightly more prominent compared with the prior chest x-ray  of 09/08/2015. Suspect metastatic disease. Anterior mediastinal mass better seen on prior cross-sectional imaging.  Preoperative labs noted. Cr 1.44, previously 1.38-1.54 since 09/08/15. H/H 10.0/29.1, previously 10.3-30.5 on 09/23/15. PT/PTT WNL. Glucose 91.   Further evaluation by her assigned anesthesiologist on the day of surgery.  George Hugh [facility] Short Stay Center/Anesthesiology Phone [phone] 10/05/2015 12:59 PM

## 2015-10-05 NOTE — Progress Notes (Signed)
Spoke with Thurmond Butts, RN, to make MD aware of abnormal chest x ray, labs ( HCT 29.1) + MRSA, and pt coughing up a clot of blood once last night and today. Pt chart forwarded to anesthesia for review.

## 2015-10-05 NOTE — Progress Notes (Signed)
Pt denies SOB, chest pain, and being under the care of a cardiologist. Pt denies having a stress test, echo and cardiac cath. Pt stated that she has a constant cough with clear sputum however, pt C/O expelling " a dark bloody clot once last night and once this morning."  Pt denies any active bleeding.

## 2015-10-05 NOTE — Progress Notes (Signed)
Pt stated that she currently uses her G-tube.

## 2015-10-05 NOTE — Pre-Procedure Instructions (Signed)
Shelby Larson  10/05/2015      [facility] [address] [place] - [address] [place] [address] Phone: [phone] Fax: [place], [place] [address] Phone: [phone] Fax: [phone]    Your procedure is scheduled on Thursday, October 07, 2015  Report to [facility] Admitting at 5:45 A.M.  Call this number if you have problems the morning of surgery:  [phone]   Remember:  Do not eat food or drink liquids after midnight Wednesday, October 06, 2015  Take these medicines the morning of surgery with A SIP OF WATER: gabapentin (NEURONTIN), cevimeline (EVOXAC)  levothyroxine , (LEVOTHROID), DULoxetine (CYMBALTA)   if needed: HYDROcodone-acetaminophen ([place]) for pain, LORazepam (ATIVAN) for anxiety,  Visine for dry eyes, fluticasone (FLONASE) nasal spray Stop taking Aspirin, vitamins, fish oil and herbal medications. Do not take any NSAIDs ie: Ibuprofen, Advil, Naproxen, BC and Goody Powder or any medication containing Aspirin; stop now.  Do not wear jewelry, make-up or nail polish.  Do not wear lotions, powders, or perfumes.  You may not wear deodorant.  Do not shave 48 hours prior to surgery.   Do not bring valuables to the hospital.  [facility] is not responsible for any belongings or valuables.  Contacts, dentures or bridgework may not be worn into surgery.  Leave your suitcase in the car.  After surgery it may be brought to your room.  For patients admitted to the hospital, discharge time will be determined by your treatment team.  Patients discharged the day of surgery will not be allowed to drive home.   Name and phone number of your driver:   Special instructions:  [place] - Preparing for Surgery  Before surgery, you can play an important role.  Because skin is not sterile, your skin needs to be as free of germs as possible.   You can reduce the number of germs on you skin by washing with CHG (chlorahexidine gluconate) soap before surgery.  CHG is an antiseptic cleaner which kills germs and bonds with the skin to continue killing germs even after washing.  Please DO NOT use if you have an allergy to CHG or antibacterial soaps.  If your skin becomes reddened/irritated stop using the CHG and inform your nurse when you arrive at Short Stay.  Do not shave (including legs and underarms) for at least 48 hours prior to the first CHG shower.  You may shave your face.  Please follow these instructions carefully:   1.  Shower with CHG Soap the night before surgery and the morning of Surgery.  2.  If you choose to wash your hair, wash your hair first as usual with your normal shampoo.  3.  After you shampoo, rinse your hair and body thoroughly to remove the Shampoo.  4.  Use CHG as you would any other liquid soap.  You can apply chg directly  to the skin and wash gently with scrungie or a clean washcloth.  5.  Apply the CHG Soap to your body ONLY FROM THE NECK DOWN.  Do not use on open wounds or open sores.  Avoid contact with your eyes, ears, mouth and genitals (private parts).  Wash genitals (private parts) with your normal soap.  6.  Wash thoroughly, paying special attention to the area where your surgery will be performed.  7.  Thoroughly  rinse your body with warm water from the neck down.  8.  DO NOT shower/wash with your normal soap after using and rinsing off the CHG Soap.  9.  Pat yourself dry with a clean towel.            10.  Wear clean pajamas.            11.  Place clean sheets on your bed the night of your first shower and do not sleep with pets.  Day of Surgery  Do not apply any lotions/deodorants the morning of surgery.  Please wear clean clothes to the hospital/surgery center. Please read over the following fact sheets that you were given. Pain Booklet, Coughing and Deep Breathing, MRSA Information and  Surgical Site Infection Prevention

## 2015-10-06 ENCOUNTER — Encounter (HOSPITAL_COMMUNITY): Payer: Medicaid Other | Attending: Hematology & Oncology

## 2015-10-06 ENCOUNTER — Other Ambulatory Visit (HOSPITAL_COMMUNITY): Payer: Self-pay | Admitting: Hematology & Oncology

## 2015-10-06 ENCOUNTER — Encounter (HOSPITAL_BASED_OUTPATIENT_CLINIC_OR_DEPARTMENT_OTHER): Payer: Medicaid Other | Admitting: Oncology

## 2015-10-06 ENCOUNTER — Encounter (HOSPITAL_COMMUNITY): Payer: Self-pay

## 2015-10-06 ENCOUNTER — Encounter (HOSPITAL_BASED_OUTPATIENT_CLINIC_OR_DEPARTMENT_OTHER): Payer: Medicaid Other

## 2015-10-06 VITALS — BP 123/84 | HR 97 | Temp 98.2°F | Resp 18 | Wt 107.5 lb

## 2015-10-06 DIAGNOSIS — N189 Chronic kidney disease, unspecified: Secondary | ICD-10-CM

## 2015-10-06 DIAGNOSIS — Z87891 Personal history of nicotine dependence: Secondary | ICD-10-CM | POA: Insufficient documentation

## 2015-10-06 DIAGNOSIS — C140 Malignant neoplasm of pharynx, unspecified: Secondary | ICD-10-CM

## 2015-10-06 DIAGNOSIS — C01 Malignant neoplasm of base of tongue: Secondary | ICD-10-CM | POA: Diagnosis not present

## 2015-10-06 DIAGNOSIS — L259 Unspecified contact dermatitis, unspecified cause: Secondary | ICD-10-CM

## 2015-10-06 DIAGNOSIS — D631 Anemia in chronic kidney disease: Secondary | ICD-10-CM | POA: Diagnosis not present

## 2015-10-06 DIAGNOSIS — R635 Abnormal weight gain: Secondary | ICD-10-CM

## 2015-10-06 MED ORDER — SODIUM CHLORIDE 0.9% FLUSH
10.0000 mL | INTRAVENOUS | Status: DC | PRN
  Administered 2015-10-06: 10 mL via INTRAVENOUS
  Filled 2015-10-06: qty 10

## 2015-10-06 MED ORDER — HEPARIN SOD (PORK) LOCK FLUSH 100 UNIT/ML IV SOLN
INTRAVENOUS | Status: AC
  Filled 2015-10-06: qty 5

## 2015-10-06 MED ORDER — PROCHLORPERAZINE MALEATE 10 MG PO TABS: 10.0000 mg | ORAL_TABLET | Freq: Four times a day (QID) | ORAL | Status: AC | PRN

## 2015-10-06 MED ORDER — DULOXETINE HCL 30 MG PO CPEP: 60.0000 mg | ORAL_CAPSULE | Freq: Every day | ORAL | Status: AC

## 2015-10-06 MED ORDER — LEVOTHYROXINE SODIUM 25 MCG PO TABS: 25.0000 ug | ORAL_TABLET | Freq: Every day | ORAL | Status: DC

## 2015-10-06 MED ORDER — DARBEPOETIN ALFA 60 MCG/0.3ML IJ SOSY
PREFILLED_SYRINGE | INTRAMUSCULAR | Status: AC
  Filled 2015-10-06: qty 0.3

## 2015-10-06 MED ORDER — FENTANYL 100 MCG/HR TD PT72: 100.0000 ug | MEDICATED_PATCH | TRANSDERMAL | Status: DC

## 2015-10-06 MED ORDER — HEPARIN SOD (PORK) LOCK FLUSH 100 UNIT/ML IV SOLN
500.0000 [IU] | Freq: Once | INTRAVENOUS | Status: AC
  Administered 2015-10-06: 500 [IU] via INTRAVENOUS

## 2015-10-06 MED ORDER — TRIAMCINOLONE ACETONIDE 0.1 % EX CREA: 1.0000 "application " | TOPICAL_CREAM | Freq: Two times a day (BID) | CUTANEOUS | Status: AC

## 2015-10-06 MED ORDER — CYCLOBENZAPRINE HCL 10 MG PO TABS: 10.0000 mg | ORAL_TABLET | Freq: Three times a day (TID) | ORAL | Status: AC | PRN

## 2015-10-06 MED ORDER — DARBEPOETIN ALFA 60 MCG/0.3ML IJ SOSY
60.0000 ug | PREFILLED_SYRINGE | Freq: Once | INTRAMUSCULAR | Status: AC
  Administered 2015-10-06: 60 ug via SUBCUTANEOUS

## 2015-10-06 MED ORDER — GABAPENTIN 600 MG PO TABS: 600.0000 mg | ORAL_TABLET | Freq: Three times a day (TID) | ORAL | Status: DC

## 2015-10-06 NOTE — Progress Notes (Signed)
Patient is seen today as a work-in.  She was here for an Aranesp injection.  Her complaints are as follows:  1. Rash on her ostomy site 2. Ankle/Pedal edema.  She notes that she saw Dr. Whitney Muse ~ 2 weeks ago who prescribe bacitracin ointment.  She has been using it as prescribed, but her rash has progressed.  She reports that it was localized to the surrounding area of her ostomy site.  Over the past 2 weeks, it has progressed inferiorly, down to her pubis.  She notes that it is pruritic.  She denies the rash anywhere else.  She denies any new medications, other than Bacitracin ointment.  She denies any new detergents, soaps, beauty products, shampoos, etc.  She denies any environmental exposures.  Exam: GEN: NAD.  Pleasant.  Smiling.  Accompanied by friend.   HEENT: Atraumatic, normocephalic. Skin: Mild erythema surrounding ostomy without discharge with maculopapular rash that is erythematous migrating inferior to her ostomy down to pubis.  Excoriations noted throughout site as well.    Back: Fentanyl patches in place.  No rash Chest: No rash Extremities: No rash, minimal-mild ankle/pedal edema Neuro: A and O x 3 without any focal deficits.   Assessment: 1. Contact dermatitis  Plan: 1. Hold Bacitracin for now. 2. Rx for Triamcinolone cream 0.1% BID to affected area.  She will call first of next week if not improved. 3. Return as scheduled. 4. Aranesp today as planned, using labs completed on 4/11 demonstrating a HGB of 10.0 g/dL. 5. LE edema can be addressed at another date as rash is priority at this time.  Patient and plan discussed with Dr. Ancil Linsey and she is in agreement with the aforementioned.   Robynn Pane, PA-C 10/06/2015 1:31 PM

## 2015-10-06 NOTE — Patient Instructions (Signed)
[place] at [facility] Discharge Instructions  RECOMMENDATIONS MADE BY THE CONSULTANT AND ANY TEST RESULTS WILL BE SENT TO YOUR REFERRING PHYSICIAN.  Thank you for choosing [place] at [facility] to provide your oncology and hematology care.  To afford each patient quality time with our provider, please arrive at least 15 minutes before your scheduled appointment time.   Beginning January 23rd 2017 lab work for the [facility] will be done in the  Main lab at [facility] on 1st floor. If you have a lab appointment with the [facility] please come in thru the  Main Entrance and check in at the main information desk  You need to re-schedule your appointment should you arrive 10 or more minutes late.  We strive to give you quality time with our providers, and arriving late affects you and other patients whose appointments are after yours.  Also, if you no show three or more times for appointments you may be dismissed from the clinic at the providers discretion.     Again, thank you for choosing [facility].  Our hope is that these requests will decrease the amount of time that you wait before being seen by our physicians.       _____________________________________________________________  Should you have questions after your visit to [facility], please contact our office at [phone] between the hours of 8:30 a.m. and 4:30 p.m.  Voicemails left after 4:30 p.m. will not be returned until the following business day.  For prescription refill requests, have your pharmacy contact our office.         Resources For Cancer Patients and their Caregivers ? American Cancer Society: Can assist with transportation, wigs, general needs, runs Look Good Feel Better.        (941) 463-3386 ? Cancer Care: Provides financial assistance, online support groups, medication/co-pay assistance.  1-800-813-HOPE 417-599-6934) ? [place] Assists [place] cancer patients and their families through emotional , educational and financial support.  (941) 472-0992 ? [place] DSS Where to apply for food stamps, Medicaid and utility assistance. (313)720-3433 ? RCATS: Transportation to medical appointments. 343-433-1207 ? Social Security Administration: May apply for disability if have a Stage IV cancer. 414-836-6325 984-515-2372 ? [employer], Disability and Transit Services: Assists with nutrition, care and transit needs. 365-247-1254

## 2015-10-06 NOTE — Progress Notes (Signed)
Shelby Larson presented for Portacath access and flush.    Portacath located rt chest wall accessed with  H 20 needle.  Good blood return present. Portacath flushed with 67ml NS and 500U/58ml Heparin and needle removed intact.  Procedure tolerated well and without incident.  TSheldon Silvan PA-C in to look at patient's reported rash on her belly. Hydrocortisone cream prescribed.

## 2015-10-06 NOTE — Patient Instructions (Signed)
[name] at [facility] Discharge Instructions  RECOMMENDATIONS MADE BY THE CONSULTANT AND ANY TEST RESULTS WILL BE SENT TO YOUR REFERRING PHYSICIAN.  Stop bacitracin ointment and use the hydrocortisone that [name] is sending to drugstore  Thank you for choosing [place] at [facility] to provide your oncology and hematology care.  To afford each patient quality time with our provider, please arrive at least 15 minutes before your scheduled appointment time.   Beginning January 23rd 2017 lab work for the [facility] will be done in the  Main lab at [facility] on 1st floor. If you have a lab appointment with the [facility] please come in thru the  Main Entrance and check in at the main information desk  You need to re-schedule your appointment should you arrive 10 or more minutes late.  We strive to give you quality time with our providers, and arriving late affects you and other patients whose appointments are after yours.  Also, if you no show three or more times for appointments you may be dismissed from the clinic at the providers discretion.     Again, thank you for choosing [facility].  Our hope is that these requests will decrease the amount of time that you wait before being seen by our physicians.       _____________________________________________________________  Should you have questions after your visit to [facility], please contact our office at [phone] between the hours of 8:30 a.m. and 4:30 p.m.  Voicemails left after 4:30 p.m. will not be returned until the following business day.  For prescription refill requests, have your pharmacy contact our office.         Resources For Cancer Patients and their Caregivers ? American Cancer Society: Can assist with transportation, wigs, general needs, runs Look Good Feel Better.        484-752-5158 ? Cancer Care: Provides financial assistance,  online support groups, medication/co-pay assistance.  1-800-813-HOPE 270-600-0386) ? [place] Assists [place] cancer patients and their families through emotional , educational and financial support.  410-622-2891 ? [place] DSS Where to apply for food stamps, Medicaid and utility assistance. (253) 682-6032 ? RCATS: Transportation to medical appointments. 858-879-8240 ? Social Security Administration: May apply for disability if have a Stage IV cancer. ([phone] 818-9720 712-229-4777 ? [employer], Disability and Transit Services: Assists with nutrition, care and transit needs. [phone]

## 2015-10-07 ENCOUNTER — Ambulatory Visit (HOSPITAL_COMMUNITY)
Admission: RE | Admit: 2015-10-07 | Discharge: 2015-10-07 | Disposition: A | Discharge status: Home / Self Care | Payer: Medicaid Other | Source: Ambulatory Visit | Attending: Thoracic Surgery (Cardiothoracic Vascular Surgery) | Admitting: Thoracic Surgery (Cardiothoracic Vascular Surgery)

## 2015-10-07 ENCOUNTER — Encounter (HOSPITAL_COMMUNITY)
Admission: RE | Disposition: A | Discharge status: Home / Self Care | Payer: Self-pay | Source: Ambulatory Visit | Attending: Thoracic Surgery (Cardiothoracic Vascular Surgery)

## 2015-10-07 ENCOUNTER — Ambulatory Visit (HOSPITAL_COMMUNITY): Payer: Medicaid Other | Admitting: Vascular Surgery

## 2015-10-07 ENCOUNTER — Encounter (HOSPITAL_COMMUNITY): Payer: Self-pay | Admitting: *Deleted

## 2015-10-07 ENCOUNTER — Ambulatory Visit (HOSPITAL_COMMUNITY): Payer: Medicaid Other | Admitting: Certified Registered"

## 2015-10-07 ENCOUNTER — Ambulatory Visit (HOSPITAL_COMMUNITY): Payer: Medicaid Other

## 2015-10-07 DIAGNOSIS — C381 Malignant neoplasm of anterior mediastinum: Secondary | ICD-10-CM | POA: Insufficient documentation

## 2015-10-07 DIAGNOSIS — R590 Localized enlarged lymph nodes: Secondary | ICD-10-CM

## 2015-10-07 DIAGNOSIS — J449 Chronic obstructive pulmonary disease, unspecified: Secondary | ICD-10-CM | POA: Diagnosis not present

## 2015-10-07 DIAGNOSIS — J9859 Other diseases of mediastinum, not elsewhere classified: Secondary | ICD-10-CM

## 2015-10-07 DIAGNOSIS — R222 Localized swelling, mass and lump, trunk: Secondary | ICD-10-CM | POA: Diagnosis not present

## 2015-10-07 DIAGNOSIS — Z85810 Personal history of malignant neoplasm of tongue: Secondary | ICD-10-CM | POA: Diagnosis not present

## 2015-10-07 DIAGNOSIS — Z923 Personal history of irradiation: Secondary | ICD-10-CM | POA: Diagnosis not present

## 2015-10-07 DIAGNOSIS — Z79899 Other long term (current) drug therapy: Secondary | ICD-10-CM | POA: Diagnosis not present

## 2015-10-07 DIAGNOSIS — M797 Fibromyalgia: Secondary | ICD-10-CM | POA: Diagnosis not present

## 2015-10-07 DIAGNOSIS — E7800 Pure hypercholesterolemia, unspecified: Secondary | ICD-10-CM | POA: Diagnosis not present

## 2015-10-07 DIAGNOSIS — R569 Unspecified convulsions: Secondary | ICD-10-CM | POA: Diagnosis not present

## 2015-10-07 DIAGNOSIS — Z9221 Personal history of antineoplastic chemotherapy: Secondary | ICD-10-CM | POA: Insufficient documentation

## 2015-10-07 DIAGNOSIS — F17210 Nicotine dependence, cigarettes, uncomplicated: Secondary | ICD-10-CM | POA: Diagnosis not present

## 2015-10-07 HISTORY — PX: MEDIASTINOTOMY: SHX5085

## 2015-10-07 HISTORY — PX: VIDEO BRONCHOSCOPY WITH ENDOBRONCHIAL ULTRASOUND: SHX6177

## 2015-10-07 SURGERY — BRONCHOSCOPY, WITH EBUS
Anesthesia: General

## 2015-10-07 MED ORDER — SUCCINYLCHOLINE CHLORIDE 20 MG/ML IJ SOLN
INTRAMUSCULAR | Status: DC | PRN
  Administered 2015-10-07: 120 mg via INTRAVENOUS

## 2015-10-07 MED ORDER — OXYCODONE HCL 5 MG PO TABS
ORAL_TABLET | ORAL | Status: AC
  Administered 2015-10-07: 5 mg via ORAL
  Filled 2015-10-07: qty 1

## 2015-10-07 MED ORDER — EPINEPHRINE HCL 1 MG/ML IJ SOLN
INTRAMUSCULAR | Status: AC
  Filled 2015-10-07: qty 1

## 2015-10-07 MED ORDER — PHENYLEPHRINE HCL 10 MG/ML IJ SOLN
INTRAMUSCULAR | Status: AC
  Filled 2015-10-07: qty 1

## 2015-10-07 MED ORDER — ROCURONIUM BROMIDE 100 MG/10ML IV SOLN
INTRAVENOUS | Status: DC | PRN
  Administered 2015-10-07: 10 mg via INTRAVENOUS
  Administered 2015-10-07 (×2): 20 mg via INTRAVENOUS

## 2015-10-07 MED ORDER — 0.9 % SODIUM CHLORIDE (POUR BTL) OPTIME
TOPICAL | Status: DC | PRN
  Administered 2015-10-07 (×2): 1000 mL

## 2015-10-07 MED ORDER — NEOSTIGMINE METHYLSULFATE 10 MG/10ML IV SOLN: INTRAVENOUS | Status: DC | PRN

## 2015-10-07 MED ORDER — PROPOFOL 10 MG/ML IV BOLUS
INTRAVENOUS | Status: DC | PRN
  Administered 2015-10-07: 120 mg via INTRAVENOUS

## 2015-10-07 MED ORDER — PHENYLEPHRINE HCL 10 MG/ML IJ SOLN
INTRAMUSCULAR | Status: DC | PRN
  Administered 2015-10-07: 40 ug via INTRAVENOUS
  Administered 2015-10-07 (×2): 80 ug via INTRAVENOUS
  Administered 2015-10-07 (×3): 120 ug via INTRAVENOUS
  Administered 2015-10-07 (×2): 80 ug via INTRAVENOUS
  Administered 2015-10-07: 40 ug via INTRAVENOUS

## 2015-10-07 MED ORDER — OXYCODONE HCL 5 MG PO TABS: 5.0000 mg | ORAL_TABLET | Freq: Four times a day (QID) | ORAL | Status: AC | PRN

## 2015-10-07 MED ORDER — MIDAZOLAM HCL 5 MG/5ML IJ SOLN
INTRAMUSCULAR | Status: DC | PRN
  Administered 2015-10-07 (×2): 1 mg via INTRAVENOUS

## 2015-10-07 MED ORDER — ONDANSETRON HCL 4 MG/2ML IJ SOLN
INTRAMUSCULAR | Status: DC | PRN
  Administered 2015-10-07: 4 mg via INTRAVENOUS

## 2015-10-07 MED ORDER — SUGAMMADEX SODIUM 200 MG/2ML IV SOLN
INTRAVENOUS | Status: AC
  Filled 2015-10-07: qty 2

## 2015-10-07 MED ORDER — MIDAZOLAM HCL 2 MG/2ML IJ SOLN
INTRAMUSCULAR | Status: AC
Start: 2015-10-07 — End: 2015-10-07
  Filled 2015-10-07: qty 2

## 2015-10-07 MED ORDER — OXYCODONE HCL 5 MG PO TABS
5.0000 mg | ORAL_TABLET | Freq: Once | ORAL | Status: AC
  Administered 2015-10-07: 5 mg via ORAL

## 2015-10-07 MED ORDER — FENTANYL CITRATE (PF) 250 MCG/5ML IJ SOLN
INTRAMUSCULAR | Status: AC
  Filled 2015-10-07: qty 5

## 2015-10-07 MED ORDER — PHENYLEPHRINE 40 MCG/ML (10ML) SYRINGE FOR IV PUSH (FOR BLOOD PRESSURE SUPPORT)
PREFILLED_SYRINGE | INTRAVENOUS | Status: AC
  Filled 2015-10-07: qty 40

## 2015-10-07 MED ORDER — PHENYLEPHRINE HCL 10 MG/ML IJ SOLN
10.0000 mg | INTRAVENOUS | Status: DC | PRN
  Administered 2015-10-07: 50 ug/min via INTRAVENOUS

## 2015-10-07 MED ORDER — SUGAMMADEX SODIUM 200 MG/2ML IV SOLN
INTRAVENOUS | Status: DC | PRN
  Administered 2015-10-07: 100 mg via INTRAVENOUS

## 2015-10-07 MED ORDER — DEXMEDETOMIDINE HCL 200 MCG/2ML IV SOLN
INTRAVENOUS | Status: DC | PRN
  Administered 2015-10-07 (×6): 8 ug via INTRAVENOUS

## 2015-10-07 MED ORDER — LACTATED RINGERS IV SOLN
INTRAVENOUS | Status: DC
  Administered 2015-10-07: 07:00:00 via INTRAVENOUS

## 2015-10-07 MED ORDER — VANCOMYCIN HCL IN DEXTROSE 1-5 GM/200ML-% IV SOLN
1000.0000 mg | INTRAVENOUS | Status: AC
  Administered 2015-10-07: 1000 mg via INTRAVENOUS
  Filled 2015-10-07: qty 200

## 2015-10-07 MED ORDER — OXYCODONE HCL 5 MG PO TABS: 5.0000 mg | ORAL_TABLET | Freq: Four times a day (QID) | ORAL | Status: DC | PRN

## 2015-10-07 MED ORDER — FENTANYL CITRATE (PF) 250 MCG/5ML IJ SOLN
INTRAMUSCULAR | Status: DC | PRN
  Administered 2015-10-07 (×3): 50 ug via INTRAVENOUS

## 2015-10-07 MED ORDER — HYDROMORPHONE HCL 1 MG/ML IJ SOLN: 0.2500 mg | INTRAMUSCULAR | Status: DC | PRN

## 2015-10-07 MED ORDER — PROPOFOL 10 MG/ML IV BOLUS
INTRAVENOUS | Status: AC
Start: 2015-10-07 — End: 2015-10-07
  Filled 2015-10-07: qty 20

## 2015-10-07 SURGICAL SUPPLY — 66 items
BLADE SURG 10 STRL SS (BLADE) ×4 IMPLANT
BRUSH CYTOL CELLEBRITY 1.5X140 (MISCELLANEOUS) IMPLANT
CANISTER SUCTION 2500CC (MISCELLANEOUS) ×4 IMPLANT
CATH THORACIC 28FR (CATHETERS) IMPLANT
CLIP TI MEDIUM 6 (CLIP) ×8 IMPLANT
CONT SPEC 4OZ CLIKSEAL STRL BL (MISCELLANEOUS) ×8 IMPLANT
COTTONBALL LRG STERILE PKG (GAUZE/BANDAGES/DRESSINGS) IMPLANT
COVER DOME SNAP 22 D (MISCELLANEOUS) ×4 IMPLANT
COVER SURGICAL LIGHT HANDLE (MISCELLANEOUS) ×4 IMPLANT
COVER TABLE BACK 60X90 (DRAPES) ×4 IMPLANT
DERMABOND ADVANCED (GAUZE/BANDAGES/DRESSINGS) ×2
DERMABOND ADVANCED .7 DNX12 (GAUZE/BANDAGES/DRESSINGS) ×2 IMPLANT
DRAPE LAPAROTOMY T 102X78X121 (DRAPES) ×4 IMPLANT
ELECT CAUTERY BLADE 6.4 (BLADE) ×4 IMPLANT
ELECT REM PT RETURN 9FT ADLT (ELECTROSURGICAL) ×4
ELECTRODE REM PT RTRN 9FT ADLT (ELECTROSURGICAL) ×2 IMPLANT
FILTER STRAW FLUID ASPIR (MISCELLANEOUS) IMPLANT
FORCEPS BIOP RJ4 1.8 (CUTTING FORCEPS) IMPLANT
GAUZE SPONGE 4X4 12PLY STRL (GAUZE/BANDAGES/DRESSINGS) ×4 IMPLANT
GAUZE SPONGE 4X4 16PLY XRAY LF (GAUZE/BANDAGES/DRESSINGS) ×4 IMPLANT
GLOVE BIOGEL PI IND STRL 6.5 (GLOVE) ×4 IMPLANT
GLOVE BIOGEL PI INDICATOR 6.5 (GLOVE) ×4
GLOVE SURG SIGNA 7.5 PF LTX (GLOVE) IMPLANT
GLOVE SURG SS PI 6.5 STRL IVOR (GLOVE) ×8 IMPLANT
GLOVE SURG SS PI 7.5 STRL IVOR (GLOVE) ×4 IMPLANT
GOWN STRL REUS W/ TWL XL LVL3 (GOWN DISPOSABLE) ×4 IMPLANT
GOWN STRL REUS W/TWL XL LVL3 (GOWN DISPOSABLE) ×4
HEMOSTAT SURGICEL 2X14 (HEMOSTASIS) ×4 IMPLANT
KIT BASIN OR (CUSTOM PROCEDURE TRAY) ×4 IMPLANT
KIT CLEAN ENDO COMPLIANCE (KITS) ×8 IMPLANT
KIT ROOM TURNOVER OR (KITS) ×4 IMPLANT
MARKER SKIN DUAL TIP RULER LAB (MISCELLANEOUS) ×4 IMPLANT
NEEDLE 22X1 1/2 (OR ONLY) (NEEDLE) IMPLANT
NEEDLE BIOPSY TRANSBRONCH 21G (NEEDLE) IMPLANT
NEEDLE BLUNT 18X1 FOR OR ONLY (NEEDLE) IMPLANT
NEEDLE EBUS SONO TIP PENTAX (NEEDLE) ×4 IMPLANT
NEEDLE SONO TIP II EBUS (NEEDLE) IMPLANT
NS IRRIG 1000ML POUR BTL (IV SOLUTION) ×4 IMPLANT
OIL SILICONE PENTAX (PARTS (SERVICE/REPAIRS)) ×4 IMPLANT
PACK GENERAL/GYN (CUSTOM PROCEDURE TRAY) ×4 IMPLANT
PACK SURGICAL SETUP 50X90 (CUSTOM PROCEDURE TRAY) IMPLANT
PAD ARMBOARD 7.5X6 YLW CONV (MISCELLANEOUS) ×8 IMPLANT
PENCIL BUTTON HOLSTER BLD 10FT (ELECTRODE) ×4 IMPLANT
SPONGE INTESTINAL PEANUT (DISPOSABLE) IMPLANT
STAPLER VISISTAT 35W (STAPLE) IMPLANT
SUT SILK 2 0 TIES 10X30 (SUTURE) IMPLANT
SUT VIC AB 2-0 CT1 27 (SUTURE) ×2
SUT VIC AB 2-0 CT1 TAPERPNT 27 (SUTURE) ×2 IMPLANT
SUT VIC AB 3-0 SH 27 (SUTURE) ×2
SUT VIC AB 3-0 SH 27XBRD (SUTURE) ×2 IMPLANT
SUT VIC AB 3-0 X1 27 (SUTURE) ×4 IMPLANT
SYR 20CC LL (SYRINGE) ×8 IMPLANT
SYR 20ML ECCENTRIC (SYRINGE) ×4 IMPLANT
SYR 5ML LL (SYRINGE) ×4 IMPLANT
SYR 5ML LUER SLIP (SYRINGE) ×4 IMPLANT
SYR CONTROL 10ML LL (SYRINGE) ×4 IMPLANT
SYRINGE 10CC LL (SYRINGE) ×4 IMPLANT
SYSTEM SAHARA CHEST DRAIN RE-I (WOUND CARE) IMPLANT
TOWEL OR 17X24 6PK STRL BLUE (TOWEL DISPOSABLE) ×8 IMPLANT
TOWEL OR 17X26 10 PK STRL BLUE (TOWEL DISPOSABLE) ×4 IMPLANT
TRAP SPECIMEN MUCOUS 40CC (MISCELLANEOUS) ×4 IMPLANT
TUBE CONNECTING 12'X1/4 (SUCTIONS) ×1
TUBE CONNECTING 12X1/4 (SUCTIONS) ×3 IMPLANT
TUBE CONNECTING 20'X1/4 (TUBING) ×1
TUBE CONNECTING 20X1/4 (TUBING) ×3 IMPLANT
WATER STERILE IRR 1000ML POUR (IV SOLUTION) ×4 IMPLANT

## 2015-10-07 NOTE — Transfer of Care (Signed)
Immediate Anesthesia Transfer of Care Note  Patient: Shelby Larson  Procedure(s) Performed: Procedure(s): VIDEO BRONCHOSCOPY WITH ENDOBRONCHIAL ULTRASOUND (N/A) LEFT ANTERIOR MEDIASTINOTOMY (Left)  Patient Location: PACU  Anesthesia Type:General  Level of Consciousness: awake, alert , oriented and patient cooperative  Airway & Oxygen Therapy: Patient Spontanous Breathing and Patient connected to nasal cannula oxygen  Post-op Assessment: Report given to RN, Post -op Vital signs reviewed and stable and Patient moving all extremities  Post vital signs: Reviewed and stable  Last Vitals:  Filed Vitals:   10/07/15 1014 10/07/15 1015  BP: 111/79   Pulse: 91   Temp:  36.1 C  Resp:      Complications: No apparent anesthesia complications

## 2015-10-07 NOTE — Interval H&P Note (Signed)
History and Physical Interval Note:  10/07/2015 7:50 AM  Shelby Larson  has presented today for surgery, with the diagnosis of MEDIASTINAL ADENOPATHY ANTERIOR MEDIASTINAL MASS  The various methods of treatment have been discussed with the patient and family. After consideration of risks, benefits and other options for treatment, the patient has consented to  Procedure(s): VIDEO BRONCHOSCOPY WITH ENDOBRONCHIAL ULTRASOUND (N/A) POSSIBLE LEFT ANTERIOR MEDIASTINOTOMY (Left) as a surgical intervention .  The patient's history has been reviewed, patient examined, no change in status, stable for surgery.  I have reviewed the patient's chart and labs.  Questions were answered to the patient's satisfaction.     Melrose Nakayama

## 2015-10-07 NOTE — Brief Op Note (Signed)
10/07/2015  10:03 AM  PATIENT:  Shelby Larson  50 y.o. 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   Delay start of Pharmacological VTE agent (>24hrs) due to surgical blood loss or risk of bleeding: not applicable

## 2015-10-07 NOTE — Op Note (Signed)
NAME:  Shelby Larson, Shelby Larson           ACCOUNT NO.:  [id number]  MEDICAL RECORD NO.:  [id number]  LOCATION:  [facility]                         FACILITY:  [facility]  PHYSICIAN:  Revonda Standard. Roxan Hockey, M.D.DATE OF BIRTH:  Jun 26, 1966  DATE OF PROCEDURE:  10/07/2015 DATE OF DISCHARGE:                              OPERATIVE REPORT   PREOPERATIVE DIAGNOSIS:  Mediastinal adenopathy and anterior mediastinal mass.  POSTOPERATIVE DIAGNOSIS:  Mediastinal adenopathy and anterior mediastinal mass- squamous cell carcinoma.  PROCEDURE:  Video bronchoscopy, endobronchial ultrasound, and left anterior mediastinotomy.  SURGEON:  Revonda Standard. Roxan Hockey, M.D.  ASSISTANT:  None.  ANESTHESIA:  General.  FINDINGS:  Bronchoscopy was within normal limits.  Endobronchial ultrasound- enlarged level 7 node, initial aspirations no tumor seen.  Anterior Mediastinotomy- large mass.  Frozen section revealed squamous cell carcinoma.  CLINICAL NOTE:  Shelby Larson is a 50 year old woman with a history of squamous cell carcinoma of the tongue.  She has been treated with radiation and chemotherapy.  Recently, she had a PET-CT which showed multiple hypermetabolic hilar and mediastinal lymph nodes and also hypermetabolic anterior mediastinal mass suspicious for recurrence. She was advised to undergo bronchoscopy, endobronchial ultrasound, and possible left anterior mediastinotomy for diagnostic purposes.  The indications, risks, benefits, and alternatives were discussed in detail with the patient.  She understood and accepted the risks and agreed to proceed.  OPERATIVE NOTE:  Shelby Larson was brought to the operating room on October 07, 2015.  She had induction of general anesthesia and was intubated. Flexible fiberoptic bronchoscopy was performed via the endotracheal tube.  It revealed normal endobronchial anatomy with no endobronchial lesions.  Endobronchial ultrasound scope was advanced.  There were multiple nodes, the  most prominent node was the level 7 subcarinal node.  This had the best window for aspirations.  Multiple aspirations were performed.  With each aspiration, the needle was advanced into the lymph node.  Suction was applied and 10-12 passes were made with ultrasound visualization. The initial slides were sent for quick prep.  Additional aspirations were performed for cell block.  The initial aspirations came back showing minimal cellularity with no tumor seen and the decision was made to proceed with left anterior mediastinotomy as discussed with the patient preoperatively.  The chest was prepped and draped in the usual sterile fashion.  An incision was made in the fourth interspace anteriorly, adjacent to the sternum.  It was carried through the skin and subcutaneous tissue.  The pectoralis muscle fibers were separated.  The intercostal muscle was divided.  The mass was visible.  A thin layer of pleura overlying the mass was incised and a biopsy was obtained of the mass and sent for frozen section.  Additional tissue was removed for a permanent pathology.  Hemostasis was achieved.  Surgicel was applied to the raw surface of the mass and pressure was held for 5 minutes.  The wound was copiously irrigated with warm saline.  A final inspection was made for hemostasis.  The pectoralis was closed with 2-0 Vicryl suture, the subcutaneous tissue with a 3-0 Vicryl suture, and the skin was closed with 3-0 Vicryl subcuticular suture.  Dermabond was applied.  The patient was extubated in the operating room and taken  to the postanesthetic care unit in good condition.  All sponge, needle, and instrument counts were correct at the end of the procedure.     Revonda Standard Roxan Hockey, M.D.     SCH/MEDQ  D:  10/07/2015  T:  10/07/2015  Job:  [id number]

## 2015-10-07 NOTE — Anesthesia Postprocedure Evaluation (Signed)
Anesthesia Post Note  Patient: Shelby Larson  Procedure(s) Performed: Procedure(s) (LRB): VIDEO BRONCHOSCOPY WITH ENDOBRONCHIAL ULTRASOUND (N/A) LEFT ANTERIOR MEDIASTINOTOMY (Left)  Patient location during evaluation: PACU Anesthesia Type: General Level of consciousness: awake Pain management: pain level controlled Vital Signs Assessment: post-procedure vital signs reviewed and stable Respiratory status: spontaneous breathing Cardiovascular status: stable Anesthetic complications: no    Last Vitals:  Filed Vitals:   10/07/15 1014 10/07/15 1015  BP: 111/79   Pulse: 91   Temp:  36.1 C  Resp:      Last Pain:  Filed Vitals:   10/07/15 1019  PainSc: 0-No pain                 EDWARDS,Willella Harding

## 2015-10-07 NOTE — Anesthesia Preprocedure Evaluation (Addendum)
Anesthesia Evaluation  Patient identified by MRN, date of birth, ID band Patient awake    Reviewed: Allergy & Precautions, NPO status   Airway Mallampati: II  TM Distance: >3 FB Neck ROM: Full    Dental   Pulmonary asthma , pneumonia, COPD, Current Smoker,    breath sounds clear to auscultation       Cardiovascular negative cardio ROS   Rhythm:Regular Rate:Normal     Neuro/Psych    GI/Hepatic Neg liver ROS, GERD  ,  Endo/Other  Hypothyroidism   Renal/GU negative Renal ROS     Musculoskeletal  (+) Arthritis , Fibromyalgia -  Abdominal   Peds  Hematology   Anesthesia Other Findings   Reproductive/Obstetrics                            Anesthesia Physical Anesthesia Plan  ASA: III  Anesthesia Plan: General   Post-op Pain Management:    Induction: Intravenous  Airway Management Planned: Oral ETT  Additional Equipment:   Intra-op Plan:   Post-operative Plan: Possible Post-op intubation/ventilation  Informed Consent: I have reviewed the patients History and Physical, chart, labs and discussed the procedure including the risks, benefits and alternatives for the proposed anesthesia with the patient or authorized representative who has indicated his/her understanding and acceptance.   Dental advisory given  Plan Discussed with: CRNA and Anesthesiologist  Anesthesia Plan Comments:         Anesthesia Quick Evaluation

## 2015-10-07 NOTE — H&P (View-Only) (Signed)
PCP is [name], MD Referring Provider is Baird Cancer, PA-C  Chief Complaint  Patient presents with  . Lung Mass    Surgical eval for BX, PET Scan 09/15/15, HX of tongue cancer    HPI: Shelby Larson is a 50 year old woman sent for consultation for possible biopsy.  She is a 50 year old woman with a history of tobacco abuse who in 2016 was diagnosed with stage IV a cancer of the tongue. She was treated with radiation and chemotherapy and also had a left neck dissection in February 2016. She had a tracheostomy, but that has since been removed.  Back in December she had pneumonia with a large cavitary "mass" in the left lung.  She recently had a PET/CT which showed multiple hypermetabolic hilar and mediastinal nodes and also a hypermetabolic anterior mediastinal mass. This is suspicious for recurrence.  She denies any history of cardiac problems. She does continue to smoke about one half a pack a cigarettes daily. She has issues with swallowing and altered taste of food. She's lost about 5 pounds over the past 3 months.  Shelby Larson 50 y.o. 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 She has completed concurrent XRT/cisplatin.   Shelby Larson Score: At the time of surgery this patient's most appropriate activity status/level should be described as: []     0    Normal activity, no symptoms [x]     1    Restricted in physical strenuous activity but ambulatory, able to do out light work []     2    Ambulatory and capable of self care, unable to do work activities, up and about >50 % of waking hours                              []     3    Only limited self care, in bed greater than 50% of waking hours []     4    Completely disabled, no self care, confined to bed  or chair []     5    Moribund   Past Medical History  Diagnosis Date  . Heart murmur     as a child  . Chronic bronchitis ([place])   . Anxiety     panic attacks  . Depression   . Headache     onset a few months ago  . Neuropathy ([place])     had it in both hands  . Arthritis   . Fibromyalgia   . Anemia   . Rosacea   . Hypercholesterolemia   . Asthma   . COPD (chronic obstructive pulmonary disease) ([place])   . Seizures ([place])     takes Gabapentin  (last one in 2010); head trauma caused seizures.  . Malignant neoplasm of base of tongue ([place]) 2016    Base of tongue with neck metastases    Past Surgical History  Procedure Laterality Date  . Wisdom tooth extraction    . Tonsillectomy Left 08/20/2014    Procedure: TONSILLECTOMY;  Surgeon: Ruby Cola, MD;  Location: [facility];  Service: ENT;  Laterality: Left;  . Radical neck dissection Left 08/20/2014    Procedure: RADICAL LEFT NECK DISSECTION ;  Surgeon: Ruby Cola, MD;  Location: [facility];  Service: ENT;  Laterality:  Left;  . Tracheostomy tube placement N/A 08/20/2014    Procedure: TRACHEOSTOMY - AWAKE;  Surgeon: Ruby Cola, MD;  Location: [facility];  Service: ENT;  Laterality: N/A;  . Gastrostomy tube placement    . Multiple extractions with alveoloplasty N/A 09/17/2014    Procedure: Extraction of tooth #'s [id number] with alveoloplasty, mandibular left lingual torus reduction, and gross debridement of remaining teeth.;  Surgeon: Lenn Cal, DDS;  Location: [facility];  Service: Oral Surgery;  Laterality: N/A;  . Portacath placement    . Colonoscopy with propofol N/A 04/15/2015    RMR: colonic polyps removed as described above.,  . Polypectomy N/A 04/15/2015    Procedure: POLYPECTOMY;  Surgeon: Daneil Dolin, MD;  Location: [facility];  Service: Endoscopy;  Laterality: N/A;  . Esophagogastroduodenoscopy (egd) with propofol N/A 07/19/2015    RMR: poorly postitioned PEG tube. Status post removal and replacement of a small bowel video  capsule  . Givens capsule study N/A 07/19/2015    Procedure: GIVENS CAPSULE STUDY;  Surgeon: Daneil Dolin, MD;  Location: [facility];  Service: Endoscopy;  Laterality: N/A;    Family History  Problem Relation Age of Onset  . Neuropathy Mother   . Hypertension Mother   . Alcoholism Father   . Alcoholism Sister     Social History Social History  Substance Use Topics  . Smoking status: Current Every Day Smoker -- 0.50 packs/day for 20 years    Types: Cigarettes    Last Attempt to Quit: 06/18/2014  . Smokeless tobacco: Never Used     Comment: Per pt she restarted 2016 as of 08-17-15  . Alcohol Use: 0.0 oz/week    0 Standard drinks or equivalent per week     Comment: occasional, 09/02/14 no longer using, per pt no 08-17-15    Current Outpatient Prescriptions  Medication Sig Dispense Refill  . bacitracin 500 UNIT/GM ointment Apply 1 application topically 2 (two) times daily. 28 g 0  . BIOTIN 5000 PO Take 10,000 mg by mouth daily.    . cevimeline (EVOXAC) 30 MG capsule Take 1 capsule (30 mg total) by mouth 3 (three) times daily. 90 capsule 2  . cyclobenzaprine (FLEXERIL) 10 MG tablet Take 1 tablet (10 mg total) by mouth 3 (three) times daily as needed for muscle spasms. 30 tablet 1  . DULoxetine (CYMBALTA) 30 MG capsule Take 1 capsule (30 mg total) by mouth daily. Taking 2 tablets in AM 60 capsule 0  . fentaNYL (DURAGESIC - DOSED MCG/HR) 100 MCG/HR Place 1 patch (100 mcg total) onto the skin every 3 (three) days. 10 patch 0  . fluticasone (FLONASE) 50 MCG/ACT nasal spray Place 2 sprays into both nostrils daily. (Patient taking differently: Place 2 sprays into both nostrils daily as needed. ) 16 g 3  . gabapentin (NEURONTIN) 600 MG tablet Take 600 mg by mouth 3 (three) times daily.    Marland Kitchen HYDROcodone-acetaminophen (NORCO) 10-325 MG tablet Take 1 tablet by mouth every 6 (six) hours as needed. 60 tablet 0  . levothyroxine (LEVOTHROID) 25 MCG tablet Take 1 tablet (25 mcg total) by mouth daily  before breakfast. 30 tablet 1  . LORazepam (ATIVAN) 2 MG tablet Take 1 tablet (2 mg total) by mouth every 6 (six) hours as needed for anxiety. 30 tablet 1  . Nutritional Supplements (FEEDING SUPPLEMENT, JEVITY 1.5 CAL/FIBER,) LIQD 5 cans. Nocturnal feed. Rate100 ml/hr x 12 hours. Provides. 1778 kcals, 76 g pro, 900 mls fluid.  Flush 180 mls 5x  a day.    . prochlorperazine (COMPAZINE) 10 MG tablet Take 1 tablet (10 mg total) by mouth every 6 (six) hours as needed for nausea or vomiting. 60 tablet 2  . QUEtiapine (SEROQUEL) 50 MG tablet Take 1 tablet (50 mg total) by mouth daily as needed (sleep). 30 tablet 1  . sodium fluoride (PREVIDENT 5000 PLUS) 1.1 % CREA dental cream Apply to tooth brush. Brush teeth for 2 minutes. Spit out excess-DO NOT swallow. Repeat nightly. 1 Tube prn  . tetrahydrozoline-zinc (VISINE-AC) 0.05-0.25 % ophthalmic solution Place 2 drops into both eyes daily as needed (dry eyes).     No current facility-administered medications for this visit.   Facility-Administered Medications Ordered in Other Visits  Medication Dose Route Frequency Provider Last Rate Last Dose  . sodium chloride 0.9 % injection 10 mL  10 mL Intravenous PRN Patrici Ranks, MD   10 mL at 03/25/15 1530  . sodium chloride 0.9 % injection 10 mL  10 mL Intravenous PRN Baird Cancer, PA-C   10 mL at 07/14/15 1200    Allergies  Allergen Reactions  . Bee Venom Swelling  . Penicillins Other (See Comments)    Has patient had a PCN reaction causing immediate rash, facial/tongue/throat swelling, SOB or lightheadedness with hypotension: unknown Has patient had a PCN reaction causing severe rash involving mucus membranes or skin necrosis: unknown Has patient had a PCN reaction that required hospitalization unknown Has patient had a PCN reaction occurring within the last 10 years: unknown If all of the above answers are "NO", then may proceed with Cephalosporin use.  . Latex Swelling and Rash    Review of  Systems  Constitutional: Positive for activity change, appetite change, fatigue and unexpected weight change (Lost 5 pounds in 3 months). Negative for fever.  HENT: Positive for dental problem and trouble swallowing.   Respiratory: Positive for cough.        Pleuritic chest pain  Musculoskeletal: Positive for myalgias and arthralgias.  Skin: Positive for rash.  Neurological: Positive for seizures and numbness.  Hematological: Bruises/bleeds easily (Bruises easily, no history of excessive bleeding).  Psychiatric/Behavioral: Positive for dysphoric mood. The patient is nervous/anxious.     BP 119/83 mmHg  Pulse 96  Resp 20  Ht 5\' 6"  (1.676 m)  Wt 105 lb (47.628 kg)  BMI 16.96 kg/m2  SpO2 89%  LMP 07/15/2014 Physical Exam  Constitutional: She is oriented to person, place, and time. No distress.  Thin  HENT:  Head: Normocephalic.  Eyes: Conjunctivae and EOM are normal. No scleral icterus.  Neck: Normal range of motion.  Well-healed surgical scar from previous neck dissection  Cardiovascular: Normal rate, regular rhythm and normal heart sounds.   No murmur heard. Pulmonary/Chest: Effort normal and breath sounds normal. She has no wheezes. She has no rales.  Abdominal: Soft. She exhibits no distension. There is no tenderness.  Musculoskeletal: She exhibits no edema.  Neurological: She is alert and oriented to person, place, and time. No cranial nerve deficit.  Skin: Skin is warm and dry.  Psychiatric:  Anxious  Vitals reviewed.    Diagnostic Tests: NUCLEAR MEDICINE PET SKULL BASE TO THIGH  TECHNIQUE: 5.36 mCi F-18 FDG was injected intravenously. Full-ring PET imaging was performed from the skull base to thigh after the radiotracer. CT data was obtained and used for attenuation correction and anatomic localization.  FASTING BLOOD GLUCOSE: Value: 90 mg/dl  COMPARISON: CT chest dated 06/08/2015. PET-CT dated 02/22/2015.  FINDINGS: NECK  No hypermetabolic  lymph  nodes in the neck.  CHEST  2.2 x 4.2 cm soft tissue mass along the anterior aspect of the heart/medial aspect of the anterior left upper lobe (series 4/image 65), new from recent prior CT, max SUV 24.0.  Additional 4.7 x 1.7 cm soft tissue lesion along the left heart border/medial aspect of the left upper lobe (series 4/image 79), new from recent prior CT, max SUV 14.9.  These are both worrisome for pleural-based or less likely pericardial metastases.  Additional residual soft tissue nodularity along the left fissure measuring 1.4 x 1.7 cm (series 4/ image 76), max SUV 11.3. Associated cavitary masslike opacity with surrounding ground-glass opacity in the left upper and lower lobes has essentially resolved, suggesting an infectious etiology. As such, it is unclear whether this is related to residual infection or possibly additional pleural-based tumor.  Additional scattered thin-walled cavitary lesions with scarring in the lingula and left lower lobe, related to prior infection. No pleural effusion or pneumothorax.  The heart is normal in size. No pericardial effusion. Right chest port terminates at the cavoatrial junction.  Hypermetabolic thoracic lymphadenopathy, including:  --6 mm short axis prevascular node (series 4/ image 50), max SUV 6.0  --7 mm short axis high right paratracheal node (series 4/ image 48), max SUV 4.8  --12 mm short axis subcarinal node (series 4/image 57), max SUV 9.9  --Hypermetabolic focus in the left hilum, max SUV 8.4  --Mild hypermetabolism in the right hilum, max SUV 4.7  ABDOMEN/PELVIS  No abnormal hypermetabolic activity within the liver, pancreas, adrenal glands, or spleen.  Gastrostomy in satisfactory position. Atherosclerotic calcifications of the abdominal aorta and branch vessels. 3 mm nonobstructing right upper pole renal calculus.  No hypermetabolic lymph nodes in the abdomen or pelvis.  SKELETON  No  focal hypermetabolic activity to suggest skeletal metastasis.  IMPRESSION: 2.2 x 4.2 cm soft tissue mass along the anterior aspect of the heart and 1.7 x 4.7 cm soft tissue mass along the left heart border, suspicious for pleural-based or less likely pericardial metastases, new.  Prior left upper and lower lobe patchy opacity has essentially resolved, likely infectious. Residual 1.4 x 1.7 cm pleural-based nodularity along the left fissure may reflect additional tumor versus residual infection.  Hypermetabolic thoracic lymphadenopathy, suspicious for nodal metastases, as above.   Electronically Signed  By: Julian Hy M.D.  On: 09/15/2015 15:55   Impression: Mr. Gal is a 50 year old woman with a history of tobacco abuse with a history of cancer of the base of the tongue. She now has multiple findings on a PET/CT further suspicious for a recurrence. She did have pneumonia back in December and it was quite an impressive CT scan at that time. There is a small possibility that her adenopathy could be associated with that and be reactive in nature. However, I think malignancy is more likely. There also is a possibility that we're dealing with a different malignancy such as a lymphoma.  I recommended that we proceed with bronchoscopy and endobronchial ultrasound and possible left anterior mediastinotomy Barbra Sarks procedure). We would attempt to obtain a diagnosis in the least invasive way possible. She understands that this would be done in the operating room under general anesthesia. We would do bronchoscopy and endobronchial ultrasound via endotracheal tube. We would then wait for those results before deciding whether to proceed with the [facility] procedure. If the nodes show squamous cell carcinoma there would be no reason to do the [place].  I described the procedure to her. She  understands this would be done under general anesthesia, but most likely on an  outpatient basis. I reviewed the indications, risks, benefits, and alternatives. She understands this is diagnostic and not therapeutic. She understands the risk include those associated with general anesthesia. She understands the risk include, but are not limited to death, MI, DVT, PE, bleeding, pneumothorax, and failure to make a diagnosis. She also understands there is a possibility of other unforeseeable complications.  She accepts the risks and wishes to proceed.  Plan: Bronchoscopy, endobronchial ultrasound, and possible left anterior mediastinotomy on Thursday 10/07/2015  I spent 30 minutes with Shelby Larson during this visit, greater than 50% of the time was spent in counseling.  Melrose Nakayama, MD [facility] [phone]

## 2015-10-07 NOTE — Discharge Instructions (Addendum)
Do not drive or engage in heavy physical activity for 72 hours.   STOP hydrocodone  You have a prescription for oxycodone. You may use as prescribed for pain. Do not exceed the recommended dose.  You may use tylenol or ibuprofen in addition to, or instead of, the oxycodone  You may shower tomorrow  There is a medical adhesive over the incision, it will begin to peel off in 10-14 days  My office will contact you with follow up information  Follow up with Dr. Whitney Muse as scheduled  Call [phone] if you develop chest pain, shortness of breath, or fever > 101F  Also call if you note excessive swelling, redness or drainage from the incision

## 2015-10-07 NOTE — Anesthesia Procedure Notes (Addendum)
Procedure Name: Intubation Date/Time: 10/07/2015 8:21 AM Performed by: Myna Bright Pre-anesthesia Checklist: Patient identified, Emergency Drugs available, Suction available, Patient being monitored and Timeout performed Patient Re-evaluated:Patient Re-evaluated prior to inductionOxygen Delivery Method: Circle system utilized Preoxygenation: Pre-oxygenation with 100% oxygen Intubation Type: IV induction Laryngoscope Size: Glidescope Grade View: Grade I Tube type: Parker flex tip Tube size: 8.0 mm Number of attempts: 1 Airway Equipment and Method: Stylet and Video-laryngoscopy Placement Confirmation: ETT inserted through vocal cords under direct vision,  positive ETCO2 and breath sounds checked- equal and bilateral Secured at: 24 cm Tube secured with: Tape Dental Injury: Teeth and Oropharynx as per pre-operative assessment  Future Recommendations: Recommend- induction with short-acting agent, and alternative techniques readily available Comments: Intubation by S. McLoughlin SRNA.  Patient with history of radiation to oropharynx, radical neck dissection, and recent reversal of tracheostomy. Pharynx localized per Dr. Linna Caprice after patient sedated with precedex. Glidescope inserted with patient lightly sedated to view airway structures. Epiglottis easily visualized, airway structures distorted but visualized. Induced general anesthesia. Easy mask. Glidescope used to intubate, atraumatic oral intubation.

## 2015-10-11 ENCOUNTER — Encounter (HOSPITAL_COMMUNITY): Payer: Self-pay | Admitting: Oncology

## 2015-10-12 ENCOUNTER — Encounter (HOSPITAL_COMMUNITY): Payer: Self-pay | Admitting: Thoracic Surgery (Cardiothoracic Vascular Surgery)

## 2015-10-12 ENCOUNTER — Other Ambulatory Visit (HOSPITAL_COMMUNITY): Payer: Self-pay | Admitting: Oncology

## 2015-10-12 MED ORDER — GABAPENTIN 300 MG PO CAPS: 600.0000 mg | ORAL_CAPSULE | Freq: Three times a day (TID) | ORAL | Status: AC

## 2015-10-13 ENCOUNTER — Other Ambulatory Visit (HOSPITAL_COMMUNITY): Payer: Self-pay

## 2015-10-15 ENCOUNTER — Other Ambulatory Visit (HOSPITAL_COMMUNITY): Payer: Self-pay | Admitting: Emergency Medicine

## 2015-10-15 DIAGNOSIS — E559 Vitamin D deficiency, unspecified: Secondary | ICD-10-CM

## 2015-10-15 DIAGNOSIS — Z79899 Other long term (current) drug therapy: Secondary | ICD-10-CM

## 2015-10-15 DIAGNOSIS — R809 Proteinuria, unspecified: Secondary | ICD-10-CM

## 2015-10-15 DIAGNOSIS — N183 Chronic kidney disease, stage 3 unspecified: Secondary | ICD-10-CM

## 2015-10-15 DIAGNOSIS — D649 Anemia, unspecified: Secondary | ICD-10-CM

## 2015-10-17 ENCOUNTER — Other Ambulatory Visit (HOSPITAL_COMMUNITY): Payer: Self-pay | Admitting: Hematology & Oncology

## 2015-10-18 ENCOUNTER — Other Ambulatory Visit (HOSPITAL_COMMUNITY): Payer: Self-pay | Admitting: Oncology

## 2015-10-19 ENCOUNTER — Encounter (HOSPITAL_BASED_OUTPATIENT_CLINIC_OR_DEPARTMENT_OTHER): Payer: Medicaid Other

## 2015-10-19 ENCOUNTER — Encounter (HOSPITAL_BASED_OUTPATIENT_CLINIC_OR_DEPARTMENT_OTHER): Payer: Medicaid Other | Admitting: Hematology & Oncology

## 2015-10-19 ENCOUNTER — Encounter (HOSPITAL_COMMUNITY): Payer: Self-pay | Admitting: Hematology & Oncology

## 2015-10-19 ENCOUNTER — Ambulatory Visit (HOSPITAL_COMMUNITY): Payer: Self-pay

## 2015-10-19 VITALS — BP 118/75 | HR 95 | Temp 98.0°F | Resp 18 | Wt 106.1 lb

## 2015-10-19 DIAGNOSIS — N189 Chronic kidney disease, unspecified: Secondary | ICD-10-CM | POA: Diagnosis not present

## 2015-10-19 DIAGNOSIS — Z79899 Other long term (current) drug therapy: Secondary | ICD-10-CM

## 2015-10-19 DIAGNOSIS — R634 Abnormal weight loss: Secondary | ICD-10-CM | POA: Diagnosis not present

## 2015-10-19 DIAGNOSIS — D649 Anemia, unspecified: Secondary | ICD-10-CM

## 2015-10-19 DIAGNOSIS — K117 Disturbances of salivary secretion: Secondary | ICD-10-CM

## 2015-10-19 DIAGNOSIS — Z7189 Other specified counseling: Secondary | ICD-10-CM

## 2015-10-19 DIAGNOSIS — C7800 Secondary malignant neoplasm of unspecified lung: Secondary | ICD-10-CM

## 2015-10-19 DIAGNOSIS — N183 Chronic kidney disease, stage 3 unspecified: Secondary | ICD-10-CM

## 2015-10-19 DIAGNOSIS — D631 Anemia in chronic kidney disease: Secondary | ICD-10-CM | POA: Diagnosis not present

## 2015-10-19 DIAGNOSIS — C01 Malignant neoplasm of base of tongue: Secondary | ICD-10-CM

## 2015-10-19 DIAGNOSIS — C7802 Secondary malignant neoplasm of left lung: Secondary | ICD-10-CM

## 2015-10-19 DIAGNOSIS — F329 Major depressive disorder, single episode, unspecified: Secondary | ICD-10-CM | POA: Diagnosis not present

## 2015-10-19 DIAGNOSIS — Z87891 Personal history of nicotine dependence: Secondary | ICD-10-CM | POA: Diagnosis not present

## 2015-10-19 DIAGNOSIS — R809 Proteinuria, unspecified: Secondary | ICD-10-CM

## 2015-10-19 DIAGNOSIS — Z95828 Presence of other vascular implants and grafts: Secondary | ICD-10-CM

## 2015-10-19 DIAGNOSIS — E559 Vitamin D deficiency, unspecified: Secondary | ICD-10-CM

## 2015-10-19 LAB — CBC
HCT: 28.9 % — ABNORMAL LOW (ref 36.0–46.0)
Hemoglobin: 9.6 g/dL — ABNORMAL LOW (ref 12.0–15.0)
MCH: 30.8 pg (ref 26.0–34.0)
MCHC: 33.2 g/dL (ref 30.0–36.0)
MCV: 92.6 fL (ref 78.0–100.0)
PLATELETS: 370 10*3/uL (ref 150–400)
RBC: 3.12 MIL/uL — ABNORMAL LOW (ref 3.87–5.11)
RDW: 14.7 % (ref 11.5–15.5)
WBC: 9.4 10*3/uL (ref 4.0–10.5)

## 2015-10-19 LAB — RENAL FUNCTION PANEL
ALBUMIN: 3.3 g/dL — AB (ref 3.5–5.0)
Anion gap: 9 (ref 5–15)
BUN: 21 mg/dL — AB (ref 6–20)
CALCIUM: 9.3 mg/dL (ref 8.9–10.3)
CO2: 26 mmol/L (ref 22–32)
CREATININE: 1.36 mg/dL — AB (ref 0.44–1.00)
Chloride: 95 mmol/L — ABNORMAL LOW (ref 101–111)
GFR calc Af Amer: 52 mL/min — ABNORMAL LOW (ref >60)
GFR calc non Af Amer: 45 mL/min — ABNORMAL LOW (ref >60)
GLUCOSE: 105 mg/dL — AB (ref 65–99)
PHOSPHORUS: 3.3 mg/dL (ref 2.5–4.6)
Potassium: 4 mmol/L (ref 3.5–5.1)
SODIUM: 130 mmol/L — AB (ref 135–145)

## 2015-10-19 LAB — FERRITIN: FERRITIN: 335 ng/mL — AB (ref 11–307)

## 2015-10-19 MED ORDER — HEPARIN SOD (PORK) LOCK FLUSH 100 UNIT/ML IV SOLN
500.0000 [IU] | Freq: Once | INTRAVENOUS | Status: AC
  Administered 2015-10-19: 500 [IU] via INTRAVENOUS

## 2015-10-19 MED ORDER — CEVIMELINE HCL 30 MG PO CAPS: 30.0000 mg | ORAL_CAPSULE | Freq: Three times a day (TID) | ORAL | Status: AC

## 2015-10-19 MED ORDER — DARBEPOETIN ALFA 60 MCG/0.3ML IJ SOSY
PREFILLED_SYRINGE | INTRAMUSCULAR | Status: AC
  Filled 2015-10-19: qty 0.3

## 2015-10-19 MED ORDER — LIDOCAINE-PRILOCAINE 2.5-2.5 % EX CREA: TOPICAL_CREAM | CUTANEOUS | Status: DC

## 2015-10-19 MED ORDER — SODIUM CHLORIDE 0.9% FLUSH
10.0000 mL | Freq: Once | INTRAVENOUS | Status: AC
  Administered 2015-10-19: 10 mL via INTRAVENOUS

## 2015-10-19 MED ORDER — LEVOTHYROXINE SODIUM 25 MCG PO TABS
25.0000 ug | ORAL_TABLET | Freq: Every day | ORAL | Status: AC
Start: 2015-10-19 — End: ?

## 2015-10-19 MED ORDER — FENTANYL 100 MCG/HR TD PT72: 100.0000 ug | MEDICATED_PATCH | TRANSDERMAL | Status: DC

## 2015-10-19 MED ORDER — DARBEPOETIN ALFA 60 MCG/0.3ML IJ SOSY
60.0000 ug | PREFILLED_SYRINGE | Freq: Once | INTRAMUSCULAR | Status: AC
  Administered 2015-10-19: 60 ug via SUBCUTANEOUS

## 2015-10-19 MED ORDER — HEPARIN SOD (PORK) LOCK FLUSH 100 UNIT/ML IV SOLN
INTRAVENOUS | Status: AC
Start: 2015-10-19 — End: 2015-10-19
  Filled 2015-10-19: qty 5

## 2015-10-19 MED ORDER — HYDROCODONE-ACETAMINOPHEN 10-325 MG PO TABS: 1.0000 | ORAL_TABLET | ORAL | Status: AC | PRN

## 2015-10-19 NOTE — Progress Notes (Signed)
Shelby Larson presents today for injection per MD orders. Aranesp 60 mcg administered SQ in right Abdomen. Administration without incident. Patient tolerated well.  Shelby Larson presented for Portacath access and flush. Proper placement of portacath confirmed by CXR. Portacath located right chest wall accessed with  H 20 needle. Good blood return present. Portacath flushed with 45ml NS and 500U/77ml Heparin and needle removed intact. Procedure without incident. Patient tolerated procedure well.  Patient unable to void for urine specimen. Said specimen is for Dr.Befekadu. Patient took specimen cup with her and said she will take specimen to his office tomorrow when she see's him.

## 2015-10-19 NOTE — Progress Notes (Signed)
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 Tongue, biopsy, left base/ pharynx mass - INVASIVE MODERATELY DIFFERENTIATED SQUAMOUS CELL CARCINOMA. - SEE COMMENT. 2. Lymph nodes, radical neck dissection, Left - THREE OF SIXTEEN LYMPH NODES POSITIVE FOR METASTATIC SQUAMOUS CELL [facility]   09/18/2014 Surgery Multiple extraction of tooth numbers 2, 14, 15, 18, and 31.  4 Quadrants of alveoloplasty.  Gross debridement of remaining dentition. Mandibular left lingual torus reduction   10/08/2014 - 11/19/2014 Chemotherapy Cisplatin every 21 days   10/09/2014 - 11/30/2014 Radiation Therapy Dr. Isidore Larson with twice daily dosing at the end of treatment at patient's request due to trip to [facility] against medical advice.   02/22/2015 PET scan Prior oropharyngeal mass has essentially resolved.  No findings specific for metastatic disease.   02/22/2015 Remission    09/15/2015 PET scan 2.2 x 4.2 cm soft tissue mass along anterior aspect of heart, 1.7 x 4.7 cm mass along L heart border. thoracic LAD   10/07/2015 Procedure Video bronchoscopy, endobronchial ultrasound, L anterior mediastinotomy with Dr. Modesto Larson   10/08/2015 Pathology Results Squamous Cell Carcinoma     CURRENT THERAPY:Observation  INTERVAL HISTORY: Shelby Larson 50 y.o. female returns for followup of Stage IVA, HPV-, Moderately differentiated squamous cell  carcinoma of the base of the tongue, S/P left neck dissection with sparing of 11th cranial nerve, sternocleidomastoid muscle, internal jugular vein biopsy , tracheotomy on 07/31/2014. She has completed concurrent XRT/cisplatin. PET imaging performed in March was highly suspicious for recurrence. She has undergone biopsy with Dr. Roxan Larson which has unfortunately revealed recurrent disease within the chest.  Ms. Shelby Larson returns to the [facility] alone today. She goes by Shelby Larson.  She notes she's not good, but what's bothering her most right now is that her joints are hurting. She notes "I am swollen everywhere, it hurts to move my fingers, my toes, my knees, my elbows, my shoulders. It hurts to sleep on them." She wants to know if her cancer could be affecting her joints, due to the severity of her symptoms in that regard. She notes that the swelling has been happening for over two weeks at this point.  Ms. Shelby Larson comments that the medicine she takes for her saliva causes a significant improvement, because she can tell when she doesn't take it.  She notes that her mood is okay, and says that she "guesses" her mother is okay. Ms. Shelby Larson says that, "this time, I want to go in to treatment knowing full well what side-effects are." She also asks several times "how much time do I have," and asks for a "ballpark" estimate of how much time she has left. She remarks that quality is much more important to her than quantity. A significant portion of the appointment was spent discussing this. She says the treatment is not worth the price she pays afterward, in terms of not being able  to eat food, go to the beach, drink wine, etc.  She says she can chew up her food, but can't swallow it really anymore. She says that this started a couple of weeks ago, because it dries up so much before it goes down. She says she's tried to swallow with the assistance of drinking, but that it's "almost not worth it." She notes  consuming about 90-95% of her food through her tube, using between 4-5 cans. She says if her stomach gets distended and it doesn't make her feel good, she'll do less. She has a pump and does it at night. During the day, she notes having a milkshake and being able to get that down. She notes however that food consumption has been a problem since completing therapy.  She notes that, in terms of pain medicine, she wants to be able to mask the pain more than anything. She asks what this next round of therapy will do in terms of side-effects.she. She was advised that, realistically, her side-effects won't be terrible. She's supposed to get more teeth removed and wonders if she should proceed with that at this point.  She is still smoking, about 4-5 a day.    Malignant neoplasm of base of tongue ([place])   08/20/2014 Surgery Diagnosis 1. Tongue, biopsy, left base/ pharynx mass - INVASIVE MODERATELY DIFFERENTIATED SQUAMOUS CELL CARCINOMA. - SEE COMMENT. 2. Lymph nodes, radical neck dissection, Left - THREE OF SIXTEEN LYMPH NODES POSITIVE FOR METASTATIC SQUAMOUS CELL [facility]   09/18/2014 Surgery Multiple extraction of tooth numbers 2, 14, 15, 18, and 31.  4 Quadrants of alveoloplasty.  Gross debridement of remaining dentition. Mandibular left lingual torus reduction   10/08/2014 - 11/19/2014 Chemotherapy Cisplatin every 21 days   10/09/2014 - 11/30/2014 Radiation Therapy Dr. Isidore Larson with twice daily dosing at the end of treatment at patient's request due to trip to [facility] against medical advice.   02/22/2015 PET scan Prior oropharyngeal mass has essentially resolved.  No findings specific for metastatic disease.   02/22/2015 Remission    09/15/2015 PET scan 2.2 x 4.2 cm soft tissue mass along anterior aspect of heart, 1.7 x 4.7 cm mass along L heart border. thoracic LAD   10/07/2015 Procedure Video bronchoscopy, endobronchial ultrasound, L anterior mediastinotomy with Dr. Modesto Larson   10/08/2015 Pathology Results  Squamous Cell Carcinoma     Past Medical History  Diagnosis Date  . Heart murmur     as a child  . Chronic bronchitis ([place])   . Anxiety     panic attacks  . Depression   . Headache     onset a few months ago  . Neuropathy ([place])     had it in both hands  . Arthritis   . Fibromyalgia   . Anemia   . Rosacea   . Hypercholesterolemia   . Asthma   . COPD (chronic obstructive pulmonary disease) ([place])   . Seizures ([place])     takes Gabapentin  (last one in 2010); head trauma caused seizures.  . Malignant neoplasm of base of tongue ([place]) 2016    Base of tongue with neck metastases  . Wears glasses   . Pneumonia   . Hypothyroidism   . 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     is allergic to bee  venom; penicillins; and latex.  Ms. Mcadam had no medications administered during this visit.  Past Surgical History  Procedure Laterality Date  . Wisdom tooth extraction    . Tonsillectomy Left 08/20/2014    Procedure: TONSILLECTOMY;  Surgeon: Ruby Cola, MD;  Location: [facility] OR;  Service: ENT;  Laterality: Left;  . Radical neck dissection Left 08/20/2014    Procedure: RADICAL LEFT NECK DISSECTION ;  Surgeon: Ruby Cola, MD;  Location: [place];  Service: ENT;  Laterality: Left;  . Tracheostomy tube placement N/A 08/20/2014    Procedure: TRACHEOSTOMY - AWAKE;  Surgeon: Ruby Cola, MD;  Location: [facility];  Service: ENT;  Laterality: N/A;  . Gastrostomy tube placement    . Multiple extractions with alveoloplasty N/A 09/17/2014    Procedure: Extraction of tooth #'s [phone] with alveoloplasty, mandibular left lingual torus reduction, and gross debridement of remaining teeth.;  Surgeon: Lenn Cal, DDS;  Location: [place];  Service: Oral Surgery;   Laterality: N/A;  . Portacath placement    . Colonoscopy with propofol N/A 04/15/2015    RMR: colonic polyps removed as described above.,  . Polypectomy N/A 04/15/2015    Procedure: POLYPECTOMY;  Surgeon: Daneil Dolin, MD;  Location: [facility];  Service: Endoscopy;  Laterality: N/A;  . Esophagogastroduodenoscopy (egd) with propofol N/A 07/19/2015    RMR: poorly postitioned PEG tube. Status post removal and replacement of a small bowel video capsule  . Givens capsule study N/A 07/19/2015    Procedure: GIVENS CAPSULE STUDY;  Surgeon: Daneil Dolin, MD;  [facility];  Service: Endoscopy;  Laterality: N/A;  . Tonsillectomy    . Video bronchoscopy with endobronchial ultrasound N/A 10/07/2015    Procedure: VIDEO BRONCHOSCOPY WITH ENDOBRONCHIAL ULTRASOUND;  Surgeon: [place] Nakayama, MD;  Location: [facility];  Service: Thoracic;  Laterality: N/A;  . Mediastinotomy Left 10/07/2015    Procedure: LEFT ANTERIOR MEDIASTINOTOMY;  Surgeon: [place] Nakayama, MD;  Location: [place];  Service: Thoracic;  Laterality: Left;    Positive for joint pain and weakness.     Achy joints. Positive for loss of appetite and dry mouth    Taste change. Rare difficulty swallowing.  14 point review of systems was performed and is negative except as detailed under history of present illness and above   PHYSICAL EXAMINATION  ECOG PERFORMANCE STATUS: 1 - Symptomatic but completely ambulatory  Filed Vitals:   10/19/15 1200  BP: 118/75  Pulse: 95  Temp: 98 F (36.7 C)  Resp: 18    GENERAL:alert, no distress, well developed, comfortable, thin, tearful at times SKIN: skin color, texture, turgor are normal, no rashes or significant lesions. Bilateral neck with chronic XRT changes HEAD: Normocephalic, No masses, lesions, tenderness or abnormalities MOUTH: Dry mucus membranes EYES: normal, PERRLA, EOMI, Conjunctiva are pink and non-injected EARS: External ears normal OROPHARYNX:lips, buccal mucosa, and  tongue normal and mucous membranes are moist minimal erythema  NECK: supple, trachea midline, tracheostomy site well healed. LYMPH:  no palpable lymphadenopathy BREAST:not examined LUNGS: Overall clear, L anterior chest with well healing surgical incision site HEART: regular rhythm, tachycardic ABDOMEN:non-tender PEG site C/D/I BACK: Back symmetric, no curvature. EXTREMITIES:less then 2 second capillary refill, no skin discoloration  NEURO: alert & oriented x 3 with fluent speech, gait normal    LABORATORY DATA: I have reviewed his laboratory data listed below  CBC    Component Value Date/Time   WBC 9.4 10/19/2015 0855   WBC 11.3* 09/02/2014 1007   RBC 3.12*  10/19/2015 0855   RBC 3.22* 03/02/2015 1448   RBC 3.76 09/02/2014 1007   HGB 9.6* 10/19/2015 0855   HGB 11.1* 09/02/2014 1007   HCT 28.9* 10/19/2015 0855   HCT 34.8 09/02/2014 1007   PLT 370 10/19/2015 0855   PLT 396 09/02/2014 1007   MCV 92.6 10/19/2015 0855   MCV 92.6 09/02/2014 1007   MCH 30.8 10/19/2015 0855   MCH 29.5 09/02/2014 1007   MCHC 33.2 10/19/2015 0855   MCHC 31.9 09/02/2014 1007   RDW 14.7 10/19/2015 0855   RDW 15.4* 09/02/2014 1007   LYMPHSABS 0.5* 09/23/2015 1056   LYMPHSABS 1.5 09/02/2014 1007   MONOABS 1.2* 09/23/2015 1056   MONOABS 1.1* 09/02/2014 1007   EOSABS 0.1 09/23/2015 1056   EOSABS 0.2 09/02/2014 1007   BASOSABS 0.0 09/23/2015 1056   BASOSABS 0.0 09/02/2014 1007   CMP     Component Value Date/Time   NA 130* 10/19/2015 0855   NA 137 09/02/2014 1007   K 4.0 10/19/2015 0855   K 4.5 09/02/2014 1007   CL 95* 10/19/2015 0855   CO2 26 10/19/2015 0855   CO2 27 09/02/2014 1007   GLUCOSE 105* 10/19/2015 0855   GLUCOSE 87 09/02/2014 1007   BUN 21* 10/19/2015 0855   BUN 10.7 09/02/2014 1007   CREATININE 1.36* 10/19/2015 0855   CREATININE 0.8 09/02/2014 1007   CALCIUM 9.3 10/19/2015 0855   CALCIUM 9.2 10/19/2015 0855   CALCIUM 10.1 09/02/2014 1007   PROT 7.3 10/05/2015 0946    ALBUMIN 3.3* 10/19/2015 0855   AST 16 10/05/2015 0946   ALT 9* 10/05/2015 0946   ALKPHOS 103 10/05/2015 0946   BILITOT 1.0 10/05/2015 0946   GFRNONAA 45* 10/19/2015 0855   GFRAA 52* 10/19/2015 0855     RADIOLOGY   Study Result     CLINICAL DATA: Subsequent treatment strategy for base of tongue cancer.  EXAM: NUCLEAR MEDICINE PET SKULL BASE TO THIGH  TECHNIQUE: 5.36 mCi F-18 FDG was injected intravenously. Full-ring PET imaging was performed from the skull base to thigh after the radiotracer. CT data was obtained and used for attenuation correction and anatomic localization.  FASTING BLOOD GLUCOSE: Value: 90 mg/dl  COMPARISON: CT chest dated 06/08/2015. PET-CT dated 02/22/2015.  FINDINGS: NECK  No hypermetabolic lymph nodes in the neck.  CHEST  2.2 x 4.2 cm soft tissue mass along the anterior aspect of the heart/medial aspect of the anterior left upper lobe (series 4/image 65), new from recent prior CT, max SUV 24.0.  Additional 4.7 x 1.7 cm soft tissue lesion along the left heart border/medial aspect of the left upper lobe (series 4/image 79), new from recent prior CT, max SUV 14.9.  These are both worrisome for pleural-based or less likely pericardial metastases.  Additional residual soft tissue nodularity along the left fissure measuring 1.4 x 1.7 cm (series 4/ image 76), max SUV 11.3. Associated cavitary masslike opacity with surrounding ground-glass opacity in the left upper and lower lobes has essentially resolved, suggesting an infectious etiology. As such, it is unclear whether this is related to residual infection or possibly additional pleural-based tumor.  Additional scattered thin-walled cavitary lesions with scarring in the lingula and left lower lobe, related to prior infection. No pleural effusion or pneumothorax.  The heart is normal in size. No pericardial effusion. Right chest port terminates at the cavoatrial  junction.  Hypermetabolic thoracic lymphadenopathy, including:  --6 mm short axis prevascular node (series 4/ image 50), max SUV 6.0  --7 mm short  axis high right paratracheal node (series 4/ image 48), max SUV 4.8  --12 mm short axis subcarinal node (series 4/image 57), max SUV 9.9  --Hypermetabolic focus in the left hilum, max SUV 8.4  --Mild hypermetabolism in the right hilum, max SUV 4.7  ABDOMEN/PELVIS  No abnormal hypermetabolic activity within the liver, pancreas, adrenal glands, or spleen.  Gastrostomy in satisfactory position. Atherosclerotic calcifications of the abdominal aorta and branch vessels. 3 mm nonobstructing right upper pole renal calculus.  No hypermetabolic lymph nodes in the abdomen or pelvis.  SKELETON  No focal hypermetabolic activity to suggest skeletal metastasis.  IMPRESSION: 2.2 x 4.2 cm soft tissue mass along the anterior aspect of the heart and 1.7 x 4.7 cm soft tissue mass along the left heart border, suspicious for pleural-based or less likely pericardial metastases, new.  Prior left upper and lower lobe patchy opacity has essentially resolved, likely infectious. Residual 1.4 x 1.7 cm pleural-based nodularity along the left fissure may reflect additional tumor versus residual infection.  Hypermetabolic thoracic lymphadenopathy, suspicious for nodal metastases, as above.   Electronically Signed  By: Julian Hy M.D.  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 We discussed that treatment can be chosen for tolerance. I advised her that her  treatment will be dramatically different as we will not be radiating the head and neck region - she was somewhat confused about this.  We discussed end of life issues. We discussed a living will, code status. She is currently a full code and we will address this at each visit moving forward. We talked about setting goals of care with cure unfortunately not being one of those. She would like to be able to go to the beach a few times in the next few months.   I have her scheduled to start palliative chemotherapy Friday if she wants. I have recommended carbo/5-FU/Erbitux. I advised her that based upon symptoms we can alter therapy if needed.  She will need teaching with Hildred Alamin about her new course of treatment, as it will be completely different from last time.  We will try her on hydrocodone for breakthrough pain. She is to continue to use her fentanyl patch.   She would like a copy of her bloodwork from today, and a copy of her schedule.  I will see her back next week to assess tolerance and for ongoing discussion of her prognosis, end of life planning and goal setting.  All questions were answered. The patient knows to call the clinic with any problems, questions or concerns. We can certainly see the patient much sooner if necessary.   This document serves as a record of services personally performed by Ancil Linsey, MD. It was created on her behalf by Toni Amend, a trained medical scribe. The creation of this record is based on the scribe's personal observations and the provider's statements to them. This document has been checked and approved by the attending provider.  I have reviewed the above documentation for accuracy and completeness, and I agree with the above.  Kelby Fam. Maria Coin MD

## 2015-10-19 NOTE — Patient Instructions (Signed)
[place] at [facility] Discharge Instructions  RECOMMENDATIONS MADE BY THE CONSULTANT AND ANY TEST RESULTS WILL BE SENT TO YOUR REFERRING PHYSICIAN.  Port flush and labs today as ordered.  Aranesp 60 mcg injection given as ordered.  Thank you for choosing  [place] at [facility] to provide your oncology and hematology care.  To afford each patient quality time with our provider, please arrive at least 15 minutes before your scheduled appointment time.   Beginning January 23rd 2017 lab work for the [facility] will be done in the  Main lab at [facility] on 1st floor. If you have a lab appointment with the [facility] please come in thru the  Main Entrance and check in at the main information desk  You need to re-schedule your appointment should you arrive 10 or more minutes late.  We strive to give you quality time with our providers, and arriving late affects you and other patients whose appointments are after yours.  Also, if you no show three or more times for appointments you may be dismissed from the clinic at the providers discretion.     Again, thank you for choosing [facility].  Our hope is that these requests will decrease the amount of time that you wait before being seen by our physicians.       _____________________________________________________________  Should you have questions after your visit to [facility], please contact our office at [phone] between the hours of 8:30 a.m. and 4:30 p.m.  Voicemails left after 4:30 p.m. will not be returned until the following business day.  For prescription refill requests, have your pharmacy contact our office.         Resources For Cancer Patients and their Caregivers ? American Cancer Society: Can assist with transportation, wigs, general needs, runs Look Good Feel Better.        9013735652 ? Cancer Care: Provides financial assistance,  online support groups, medication/co-pay assistance.  1-800-813-HOPE (609)744-7269) ? [facility] Assists [place] cancer patients and their families through emotional , educational and financial support.  404-558-6778 ? [place] DSS Where to apply for food stamps, Medicaid and utility assistance. (704)518-1345 ? [facility]: Transportation to medical appointments. 4372324114 ? Social Security Administration: May apply for disability if have a Stage IV cancer. 323-088-6694 986 886 9834 ? [employer], Disability and Transit Services: Assists with nutrition, care and transit needs. 973-651-4557

## 2015-10-19 NOTE — Patient Instructions (Addendum)
[facility] at [facility] Discharge Instructions  RECOMMENDATIONS MADE BY THE CONSULTANT AND ANY TEST RESULTS WILL BE SENT TO YOUR REFERRING PHYSICIAN.   Exam and discussion by Dr Whitney Muse today Start chemotherapy Friday Hildred Alamin will do the teaching on this drug. Refills sent to your pharmacy Hydrocodone and fentanyl refilled Return to see the doctor in 1 weeks Start chemotherapy Friday You are going to complete 4 treatments, half way through we are going to re-peat scans. Erbitux, carboplatin, 14fu chemotherapy  Please call the clinic if you have any questions or concerns      Cetuximab injection What is this medicine? CETUXIMAB (se TUX i mab) is a monoclonal antibody. It is used to treat colorectal cancer and head and neck cancer. This medicine may be used for other purposes; ask your health care provider or pharmacist if you have questions. What should I tell my health care provider before I take this medicine? They need to know if you have any of these conditions: -heart disease -history of irregular heartbeat -history of low levels of calcium, magnesium, or potassium in the blood -lung or breathing disease, like asthma -an unusual or allergic reaction to cetuximab, other medicines, foods, dyes, or preservatives -pregnant or trying to get pregnant -breast-feeding How should I use this medicine? This drug is given as an infusion into a vein. It is administered in a hospital or clinic by a specially trained health care professional. Talk to your pediatrician regarding the use of this medicine in children. Special care may be needed. Overdosage: If you think you have taken too much of this medicine contact a poison control center or emergency room at once. NOTE: This medicine is only for you. Do not share this medicine with others. What if I miss a dose? It is important not to miss your dose. Call your doctor or health care professional if you are unable to  keep an appointment. What may interact with this medicine? Interactions are not expected. This list may not describe all possible interactions. Give your health care provider a list of all the medicines, herbs, non-prescription drugs, or dietary supplements you use. Also tell them if you smoke, drink alcohol, or use illegal drugs. Some items may interact with your medicine. What should I watch for while using this medicine? Visit your doctor or health care professional for regular checks on your progress. This drug may make you feel generally unwell. This is not uncommon, as chemotherapy can affect healthy cells as well as cancer cells. Report any side effects. Continue your course of treatment even though you feel ill unless your doctor tells you to stop. This medicine can make you more sensitive to the sun. Keep out of the sun while taking this medicine and for 2 months after the last dose. If you cannot avoid being in the sun, wear protective clothing and use sunscreen. Do not use sun lamps or tanning beds/booths. You may need blood work done while you are taking this medicine. In some cases, you may be given additional medicines to help with side effects. Follow all directions for their use. Call your doctor or health care professional for advice if you get a fever, chills or sore throat, or other symptoms of a cold or flu. Do not treat yourself. This drug decreases your body's ability to fight infections. Try to avoid being around people who are sick. Avoid taking products that contain aspirin, acetaminophen, ibuprofen, naproxen, or ketoprofen unless instructed by your doctor. These  medicines may hide a fever. Do not become pregnant while taking this medicine. Women should inform their doctor if they wish to become pregnant or think they might be pregnant. There is a potential for serious side effects to an unborn child. Use adequate birth control methods. Avoid pregnancy for at least 6 months after  your last dose. Talk to your health care professional or pharmacist for more information. Do not breast-feed an infant while taking this medicine or during the 2 months after your last dose. What side effects may I notice from receiving this medicine? Side effects that you should report to your doctor or health care professional as soon as possible: -allergic reactions like skin rash, itching or hives, swelling of the face, lips, or tongue -breathing problems -changes in vision -fast, irregular heartbeat -feeling faint or lightheaded, falls -fever, chills -mouth sores -redness, blistering, peeling or loosening of the skin, including inside the mouth -trouble passing urine or change in the amount of urine -unusually weak or tired Side effects that usually do not require medical attention (report to your doctor or health care professional if they continue or are bothersome): -changes in skin like acne, cracks, skin dryness -constipation -diarrhea -headache -nail changes -nausea, vomiting -stomach upset -weight loss This list may not describe all possible side effects. Call your doctor for medical advice about side effects. You may report side effects to FDA at 1-800-FDA-1088. Where should I keep my medicine? This drug is given in a hospital or clinic and will not be stored at home. NOTE: This sheet is a summary. It may not cover all possible information. If you have questions about this medicine, talk to your doctor, pharmacist, or health care provider.    2016, Elsevier/Gold Standard. (2014-08-19 22:27:08)      Carboplatin injection What is this medicine? CARBOPLATIN (KAR boe pla tin) is a chemotherapy drug. It targets fast dividing cells, like cancer cells, and causes these cells to die. This medicine is used to treat ovarian cancer and many other cancers. This medicine may be used for other purposes; ask your health care provider or pharmacist if you have questions. What should  I tell my health care provider before I take this medicine? They need to know if you have any of these conditions: -blood disorders -hearing problems -kidney disease -recent or ongoing radiation therapy -an unusual or allergic reaction to carboplatin, cisplatin, other chemotherapy, other medicines, foods, dyes, or preservatives -pregnant or trying to get pregnant -breast-feeding How should I use this medicine? This drug is usually given as an infusion into a vein. It is administered in a hospital or clinic by a specially trained health care professional. Talk to your pediatrician regarding the use of this medicine in children. Special care may be needed. Overdosage: If you think you have taken too much of this medicine contact a poison control center or emergency room at once. NOTE: This medicine is only for you. Do not share this medicine with others. What if I miss a dose? It is important not to miss a dose. Call your doctor or health care professional if you are unable to keep an appointment. What may interact with this medicine? -medicines for seizures -medicines to increase blood counts like filgrastim, pegfilgrastim, sargramostim -some antibiotics like amikacin, gentamicin, neomycin, streptomycin, tobramycin -vaccines Talk to your doctor or health care professional before taking any of these medicines: -acetaminophen -aspirin -ibuprofen -ketoprofen -naproxen This list may not describe all possible interactions. Give your health care provider a list  of all the medicines, herbs, non-prescription drugs, or dietary supplements you use. Also tell them if you smoke, drink alcohol, or use illegal drugs. Some items may interact with your medicine. What should I watch for while using this medicine? Your condition will be monitored carefully while you are receiving this medicine. You will need important blood work done while you are taking this medicine. This drug may make you feel  generally unwell. This is not uncommon, as chemotherapy can affect healthy cells as well as cancer cells. Report any side effects. Continue your course of treatment even though you feel ill unless your doctor tells you to stop. In some cases, you may be given additional medicines to help with side effects. Follow all directions for their use. Call your doctor or health care professional for advice if you get a fever, chills or sore throat, or other symptoms of a cold or flu. Do not treat yourself. This drug decreases your body's ability to fight infections. Try to avoid being around people who are sick. This medicine may increase your risk to bruise or bleed. Call your doctor or health care professional if you notice any unusual bleeding. Be careful brushing and flossing your teeth or using a toothpick because you may get an infection or bleed more easily. If you have any dental work done, tell your dentist you are receiving this medicine. Avoid taking products that contain aspirin, acetaminophen, ibuprofen, naproxen, or ketoprofen unless instructed by your doctor. These medicines may hide a fever. Do not become pregnant while taking this medicine. Women should inform their doctor if they wish to become pregnant or think they might be pregnant. There is a potential for serious side effects to an unborn child. Talk to your health care professional or pharmacist for more information. Do not breast-feed an infant while taking this medicine. What side effects may I notice from receiving this medicine? Side effects that you should report to your doctor or health care professional as soon as possible: -allergic reactions like skin rash, itching or hives, swelling of the face, lips, or tongue -signs of infection - fever or chills, cough, sore throat, pain or difficulty passing urine -signs of decreased platelets or bleeding - bruising, pinpoint red spots on the skin, black, tarry stools, nosebleeds -signs of  decreased red blood cells - unusually weak or tired, fainting spells, lightheadedness -breathing problems -changes in hearing -changes in vision -chest pain -high blood pressure -low blood counts - This drug may decrease the number of white blood cells, red blood cells and platelets. You may be at increased risk for infections and bleeding. -nausea and vomiting -pain, swelling, redness or irritation at the injection site -pain, tingling, numbness in the hands or feet -problems with balance, talking, walking -trouble passing urine or change in the amount of urine Side effects that usually do not require medical attention (report to your doctor or health care professional if they continue or are bothersome): -hair loss -loss of appetite -metallic taste in the mouth or changes in taste This list may not describe all possible side effects. Call your doctor for medical advice about side effects. You may report side effects to FDA at 1-800-FDA-1088. Where should I keep my medicine? This drug is given in a hospital or clinic and will not be stored at home. NOTE: This sheet is a summary. It may not cover all possible information. If you have questions about this medicine, talk to your doctor, pharmacist, or health care provider.  2016, Elsevier/Gold Standard. (2007-09-17 14:38:05)     Fluorouracil, 5-FU injection What is this medicine? FLUOROURACIL, 5-FU (flure oh YOOR a sil) is a chemotherapy drug. It slows the growth of cancer cells. This medicine is used to treat many types of cancer like breast cancer, colon or rectal cancer, pancreatic cancer, and stomach cancer. This medicine may be used for other purposes; ask your health care provider or pharmacist if you have questions. What should I tell my health care provider before I take this medicine? They need to know if you have any of these conditions: -blood disorders -dihydropyrimidine dehydrogenase (DPD) deficiency -infection  (especially a virus infection such as chickenpox, cold sores, or herpes) -kidney disease -liver disease -malnourished, poor nutrition -recent or ongoing radiation therapy -an unusual or allergic reaction to fluorouracil, other chemotherapy, other medicines, foods, dyes, or preservatives -pregnant or trying to get pregnant -breast-feeding How should I use this medicine? This drug is given as an infusion or injection into a vein. It is administered in a hospital or clinic by a specially trained health care professional. Talk to your pediatrician regarding the use of this medicine in children. Special care may be needed. Overdosage: If you think you have taken too much of this medicine contact a poison control center or emergency room at once. NOTE: This medicine is only for you. Do not share this medicine with others. What if I miss a dose? It is important not to miss your dose. Call your doctor or health care professional if you are unable to keep an appointment. What may interact with this medicine? -allopurinol -cimetidine -dapsone -digoxin -hydroxyurea -leucovorin -levamisole -medicines for seizures like ethotoin, fosphenytoin, phenytoin -medicines to increase blood counts like filgrastim, pegfilgrastim, sargramostim -medicines that treat or prevent blood clots like warfarin, enoxaparin, and dalteparin -methotrexate -metronidazole -pyrimethamine -some other chemotherapy drugs like busulfan, cisplatin, estramustine, vinblastine -trimethoprim -trimetrexate -vaccines Talk to your doctor or health care professional before taking any of these medicines: -acetaminophen -aspirin -ibuprofen -ketoprofen -naproxen This list may not describe all possible interactions. Give your health care provider a list of all the medicines, herbs, non-prescription drugs, or dietary supplements you use. Also tell them if you smoke, drink alcohol, or use illegal drugs. Some items may interact with your  medicine. What should I watch for while using this medicine? Visit your doctor for checks on your progress. This drug may make you feel generally unwell. This is not uncommon, as chemotherapy can affect healthy cells as well as cancer cells. Report any side effects. Continue your course of treatment even though you feel ill unless your doctor tells you to stop. In some cases, you may be given additional medicines to help with side effects. Follow all directions for their use. Call your doctor or health care professional for advice if you get a fever, chills or sore throat, or other symptoms of a cold or flu. Do not treat yourself. This drug decreases your body's ability to fight infections. Try to avoid being around people who are sick. This medicine may increase your risk to bruise or bleed. Call your doctor or health care professional if you notice any unusual bleeding. Be careful brushing and flossing your teeth or using a toothpick because you may get an infection or bleed more easily. If you have any dental work done, tell your dentist you are receiving this medicine. Avoid taking products that contain aspirin, acetaminophen, ibuprofen, naproxen, or ketoprofen unless instructed by your doctor. These medicines may hide a fever. Do  not become pregnant while taking this medicine. Women should inform their doctor if they wish to become pregnant or think they might be pregnant. There is a potential for serious side effects to an unborn child. Talk to your health care professional or pharmacist for more information. Do not breast-feed an infant while taking this medicine. Men should inform their doctor if they wish to father a child. This medicine may lower sperm counts. Do not treat diarrhea with over the counter products. Contact your doctor if you have diarrhea that lasts more than 2 days or if it is severe and watery. This medicine can make you more sensitive to the sun. Keep out of the sun. If you  cannot avoid being in the sun, wear protective clothing and use sunscreen. Do not use sun lamps or tanning beds/booths. What side effects may I notice from receiving this medicine? Side effects that you should report to your doctor or health care professional as soon as possible: -allergic reactions like skin rash, itching or hives, swelling of the face, lips, or tongue -low blood counts - this medicine may decrease the number of white blood cells, red blood cells and platelets. You may be at increased risk for infections and bleeding. -signs of infection - fever or chills, cough, sore throat, pain or difficulty passing urine -signs of decreased platelets or bleeding - bruising, pinpoint red spots on the skin, black, tarry stools, blood in the urine -signs of decreased red blood cells - unusually weak or tired, fainting spells, lightheadedness -breathing problems -changes in vision -chest pain -mouth sores -nausea and vomiting -pain, swelling, redness at site where injected -pain, tingling, numbness in the hands or feet -redness, swelling, or sores on hands or feet -stomach pain -unusual bleeding Side effects that usually do not require medical attention (report to your doctor or health care professional if they continue or are bothersome): -changes in finger or toe nails -diarrhea -dry or itchy skin -hair loss -headache -loss of appetite -sensitivity of eyes to the light -stomach upset -unusually teary eyes This list may not describe all possible side effects. Call your doctor for medical advice about side effects. You may report side effects to FDA at 1-800-FDA-1088. Where should I keep my medicine? This drug is given in a hospital or clinic and will not be stored at home. NOTE: This sheet is a summary. It may not cover all possible information. If you have questions about this medicine, talk to your doctor, pharmacist, or health care provider.    2016, Elsevier/Gold Standard.  (2007-10-16 13:53:16)      Thank you for choosing [facility] at [facility] to provide your oncology and hematology care.  To afford each patient quality time with our provider, please arrive at least 15 minutes before your scheduled appointment time.   Beginning January 23rd 2017 lab work for the [facility] will be done in the  Main lab at [facility] on 1st floor. If you have a lab appointment with the [facility] please come in thru the  Main Entrance and check in at the main information desk  You need to re-schedule your appointment should you arrive 10 or more minutes late.  We strive to give you quality time with our providers, and arriving late affects you and other patients whose appointments are after yours.  Also, if you no show three or more times for appointments you may be dismissed from the clinic at the providers discretion.  Again, thank you for choosing [facility].  Our hope is that these requests will decrease the amount of time that you wait before being seen by our physicians.       _____________________________________________________________  Should you have questions after your visit to [facility], please contact our office at [phone] between the hours of 8:30 a.m. and 4:30 p.m.  Voicemails left after 4:30 p.m. will not be returned until the following business day.  For prescription refill requests, have your pharmacy contact our office.         Resources For Cancer Patients and their Caregivers ? American Cancer Society: Can assist with transportation, wigs, general needs, runs Look Good Feel Better.        (302)161-8750 ? Cancer Care: Provides financial assistance, online support groups, medication/co-pay assistance.  1-800-813-HOPE 216-228-5303) ? [facility] Assists [place] cancer patients and their families through emotional , educational and financial support.   (870)602-0165 ? [place] DSS Where to apply for food stamps, Medicaid and utility assistance. 670-366-0111 ? [facility]: Transportation to medical appointments. 310-395-1874 ? Social Security Administration: May apply for disability if have a Stage IV cancer. 938-886-7649 438-138-2382 ? [employer], Disability and Transit Services: Assists with nutrition, care and transit needs. ([phone] [phone]

## 2015-10-20 ENCOUNTER — Other Ambulatory Visit (HOSPITAL_COMMUNITY): Payer: Self-pay

## 2015-10-20 ENCOUNTER — Ambulatory Visit (HOSPITAL_COMMUNITY): Payer: Self-pay

## 2015-10-20 ENCOUNTER — Ambulatory Visit (HOSPITAL_COMMUNITY): Payer: Self-pay | Admitting: Hematology & Oncology

## 2015-10-20 LAB — VITAMIN D 25 HYDROXY (VIT D DEFICIENCY, FRACTURES): VIT D 25 HYDROXY: 19.1 ng/mL — AB (ref 30.0–100.0)

## 2015-10-20 LAB — PTH, INTACT AND CALCIUM
CALCIUM TOTAL (PTH): 9.2 mg/dL (ref 8.7–10.2)
PTH: 32 pg/mL (ref 15–65)

## 2015-10-20 MED ORDER — LIDOCAINE-PRILOCAINE 2.5-2.5 % EX CREA: TOPICAL_CREAM | CUTANEOUS | Status: AC

## 2015-10-20 NOTE — Patient Instructions (Addendum)
[place]   CHEMOTHERAPY INSTRUCTIONS  Premeds: Aloxi - for nausea/vomiting prevention/reduction  Dexamethasone - steroid - this is for nausea/vomiting prevention/reduction. These medications will be given prior to the Carboplatin and 5FU. You will receive Benadryl 50mg  in the pill form prior to receiving Erbitux. The Benadryl is being used to reduce the risk of you having an infusional reaction to the Erbitux.  (premeds take 15 minutes to infuse)  Erbitux - this is a monoclonal antibody. This medication is derived partially from a mouse protein. Side Effects: infusion related reactions may include bronchospasm, fever, chills, shaking chills, itching, swelling around heart/blood vessels, low blood pressure, lung toxicity, acneform rash. It can also cause dry skin, fatigue, muscle aches, diarrhea, vomiting, no appetite, low white blood cell count, low magnesium, weight loss. (takes 1 hour to infuse)  Carboplatin - this medication can be hard on your kidneys - this is why we need you to drinking 64 oz of fluid (preferably water/decaff fluids) 2 days prior to chemo and for up to 4-5 days after chemo. Drink more if you can. This will help to keep your kidneys flushed. This can cause mild hair loss, lower your platelets (which keep you from bleeding out when you cut yourself), lower your white blood cells (fight infection), and cause nausea/vomiting. (only takes 30 minutes to infuse)  5FU: bone marrow suppression (low white blood cells - wbcs fight infection, low red blood cells - rbcs make up your blood, low platelets - this is what makes your blood clot, nausea/vomiting, diarrhea, mouth sores, hair loss, dry skin, ocular toxicities (increased tear production, sensitivity to light). You must wear sunscreen/sunglasses. Cover your skin when out in sunlight. You will get burned very easily. This medication will be connected to an ambulatory pump. You will wear the 5fu/pump for  96 hours. Then the pump will be removed.    POTENTIAL SIDE EFFECTS OF TREATMENT: Increased Susceptibility to Infection, Vomiting, Constipation, Hair Thinning, Changes in Character of Skin and Nails (brittleness, dryness,etc.), Bone Marrow Suppression, Complete Hair Loss, Nausea, Diarrhea, Sun Sensitivity and Mouth Sores    EDUCATIONAL MATERIALS GIVEN AND REVIEWED: Specific Instructions Sheets: Carboplatin, 5FU, Erbitux, Benadryl tablets, Dexamethasone, Compazine, EMLA    SELF CARE ACTIVITIES WHILE ON CHEMOTHERAPY: Increase your fluid intake 48 hours prior to treatment and drink at least 2 quarts (64oz decaff beverages/water) per day after treatment., No alcohol intake., No aspirin or other medications unless approved by your oncologist., Eat foods that are light and easy to digest., Eat foods at cold or room temperature., No fried, fatty, or spicy foods immediately before or after treatment., Have teeth cleaned professionally before starting treatment. Keep dentures and partial plates clean., Use soft toothbrush and do not use mouthwashes that contain alcohol. Biotene is a good mouthwash that is available at most pharmacies or may be ordered by calling [phone]., Use warm salt water gargles (1 teaspoon salt per 1 quart warm water) before and after meals and at bedtime. Or you may rinse with 2 tablespoons of three -percent hydrogen peroxide mixed in eight ounces of water., Always use sunscreen with SPF (Sun Protection Factor) of 30 or higher., Use your nausea medication as directed to prevent nausea., Use your stool softener or laxative as directed to prevent constipation. and Use your anti-diarrheal medication as directed to stop diarrhea.  Please wash your hands for at least 30 seconds using warm soapy water. Handwashing is the #1 way to prevent the spread of germs. Stay  away from sick people or people who are getting over a cold. If you develop respiratory systems such as green/yellow mucus  production or productive cough or persistent cough let us know and we will see if you need an antibiotic. It is a good idea to keep a pair of gloves on when going into grocery stores/[employer] to decrease your risk of coming into contact with germs on the carts, etc. Carry alcohol hand gel with you at all times and use it frequently if out in public. All foods need to be cooked thoroughly. No raw foods. No medium or undercooked meats, eggs. If your food is cooked medium well, it does not need to be hot pink or saturated with bloody liquid at all. Vegetables and fruits need to be washed/rinsed under the faucet with a dish detergent before being consumed. You can eat raw fruits and vegetables unless we tell you otherwise but it would be best if you cooked them or bought frozen. Do not eat off of salad bars or hot bars unless you really trust the cleanliness of the restaurant. If you need dental work, please let Dr. Whitney Muse know before you go for your appointment so that we can coordinate the best possible time for you in regards to your chemo regimen. You need to also let your dentist know that you are actively taking chemo. We may need to do labs prior to your dental appointment. We also want your bowels moving at least every other day. If this is not happening, we need to know so that we can get you on a bowel regimen to help you go. If you are going to have sex, please have your partner wear a condom. This is to protect your partner from potential chemotherapy exposure.   MEDICATIONS: You have been given prescriptions for the following medications:  Compazine/Prochlorperazine 10mg  tablet. Take 1 tablet every 6 hours as needed for nausea/vomiting. (#2 nausea med to take, this can make you sleepy)  EMLA cream. Apply a quarter size amount to port site 1 hour prior to chemo. Do not rub in. Cover with plastic wrap.   SYMPTOMS TO REPORT AS SOON AS POSSIBLE AFTER TREATMENT:  FEVER GREATER THAN 100.5 F  CHILLS  WITH OR WITHOUT FEVER  NAUSEA AND VOMITING THAT IS NOT CONTROLLED WITH YOUR NAUSEA MEDICATION  UNUSUAL SHORTNESS OF BREATH  UNUSUAL BRUISING OR BLEEDING  TENDERNESS IN MOUTH AND THROAT WITH OR WITHOUT PRESENCE OF ULCERS  URINARY PROBLEMS  BOWEL PROBLEMS  UNUSUAL RASH    Wear comfortable clothing and clothing appropriate for easy access to any Portacath or PICC line. Let us know if there is anything that we can do to make your therapy better!      I have been informed and understand all of the instructions given to me and have received a copy. I have been instructed to call the clinic [phone] or my family physician as soon as possible for continued medical care, if indicated. I do not have any more questions at this time but understand that I may call the [facility] or the Patient Navigator at [phone] during office hours should I have questions or need assistance in obtaining follow-up care.           Carboplatin injection What is this medicine? CARBOPLATIN (KAR boe pla tin) is a chemotherapy drug. It targets fast dividing cells, like cancer cells, and causes these cells to die. This medicine is used to treat ovarian cancer and many  other cancers. This medicine may be used for other purposes; ask your health care provider or pharmacist if you have questions. What should I tell my health care provider before I take this medicine? They need to know if you have any of these conditions: -blood disorders -hearing problems -kidney disease -recent or ongoing radiation therapy -an unusual or allergic reaction to carboplatin, cisplatin, other chemotherapy, other medicines, foods, dyes, or preservatives -pregnant or trying to get pregnant -breast-feeding How should I use this medicine? This drug is usually given as an infusion into a vein. It is administered in a hospital or clinic by a specially trained health care professional. Talk to your pediatrician  regarding the use of this medicine in children. Special care may be needed. Overdosage: If you think you have taken too much of this medicine contact a poison control center or emergency room at once. NOTE: This medicine is only for you. Do not share this medicine with others. What if I miss a dose? It is important not to miss a dose. Call your doctor or health care professional if you are unable to keep an appointment. What may interact with this medicine? -medicines for seizures -medicines to increase blood counts like filgrastim, pegfilgrastim, sargramostim -some antibiotics like amikacin, gentamicin, neomycin, streptomycin, tobramycin -vaccines Talk to your doctor or health care professional before taking any of these medicines: -acetaminophen -aspirin -ibuprofen -ketoprofen -naproxen This list may not describe all possible interactions. Give your health care provider a list of all the medicines, herbs, non-prescription drugs, or dietary supplements you use. Also tell them if you smoke, drink alcohol, or use illegal drugs. Some items may interact with your medicine. What should I watch for while using this medicine? Your condition will be monitored carefully while you are receiving this medicine. You will need important blood work done while you are taking this medicine. This drug may make you feel generally unwell. This is not uncommon, as chemotherapy can affect healthy cells as well as cancer cells. Report any side effects. Continue your course of treatment even though you feel ill unless your doctor tells you to stop. In some cases, you may be given additional medicines to help with side effects. Follow all directions for their use. Call your doctor or health care professional for advice if you get a fever, chills or sore throat, or other symptoms of a cold or flu. Do not treat yourself. This drug decreases your body's ability to fight infections. Try to avoid being around people who are  sick. This medicine may increase your risk to bruise or bleed. Call your doctor or health care professional if you notice any unusual bleeding. Be careful brushing and flossing your teeth or using a toothpick because you may get an infection or bleed more easily. If you have any dental work done, tell your dentist you are receiving this medicine. Avoid taking products that contain aspirin, acetaminophen, ibuprofen, naproxen, or ketoprofen unless instructed by your doctor. These medicines may hide a fever. Do not become pregnant while taking this medicine. Women should inform their doctor if they wish to become pregnant or think they might be pregnant. There is a potential for serious side effects to an unborn child. Talk to your health care professional or pharmacist for more information. Do not breast-feed an infant while taking this medicine. What side effects may I notice from receiving this medicine? Side effects that you should report to your doctor or health care professional as soon as possible: -allergic reactions  like skin rash, itching or hives, swelling of the face, lips, or tongue -signs of infection - fever or chills, cough, sore throat, pain or difficulty passing urine -signs of decreased platelets or bleeding - bruising, pinpoint red spots on the skin, black, tarry stools, nosebleeds -signs of decreased red blood cells - unusually weak or tired, fainting spells, lightheadedness -breathing problems -changes in hearing -changes in vision -chest pain -high blood pressure -low blood counts - This drug may decrease the number of white blood cells, red blood cells and platelets. You may be at increased risk for infections and bleeding. -nausea and vomiting -pain, swelling, redness or irritation at the injection site -pain, tingling, numbness in the hands or feet -problems with balance, talking, walking -trouble passing urine or change in the amount of urine Side effects that usually do  not require medical attention (report to your doctor or health care professional if they continue or are bothersome): -hair loss -loss of appetite -metallic taste in the mouth or changes in taste This list may not describe all possible side effects. Call your doctor for medical advice about side effects. You may report side effects to FDA at 1-800-FDA-1088. Where should I keep my medicine? This drug is given in a hospital or clinic and will not be stored at home. NOTE: This sheet is a summary. It may not cover all possible information. If you have questions about this medicine, talk to your doctor, pharmacist, or health care provider.    2016, Elsevier/[employer] Standard. (2007-09-17 14:38:05) Fluorouracil, 5-FU injection What is this medicine? FLUOROURACIL, 5-FU (flure oh YOOR a sil) is a chemotherapy drug. It slows the growth of cancer cells. This medicine is used to treat many types of cancer like breast cancer, colon or rectal cancer, pancreatic cancer, and stomach cancer. This medicine may be used for other purposes; ask your health care provider or pharmacist if you have questions. What should I tell my health care provider before I take this medicine? They need to know if you have any of these conditions: -blood disorders -dihydropyrimidine dehydrogenase (DPD) deficiency -infection (especially a virus infection such as chickenpox, cold sores, or herpes) -kidney disease -liver disease -malnourished, poor nutrition -recent or ongoing radiation therapy -an unusual or allergic reaction to fluorouracil, other chemotherapy, other medicines, foods, dyes, or preservatives -pregnant or trying to get pregnant -breast-feeding How should I use this medicine? This drug is given as an infusion or injection into a vein. It is administered in a hospital or clinic by a specially trained health care professional. Talk to your pediatrician regarding the use of this medicine in children. Special care may  be needed. Overdosage: If you think you have taken too much of this medicine contact a poison control center or emergency room at once. NOTE: This medicine is only for you. Do not share this medicine with others. What if I miss a dose? It is important not to miss your dose. Call your doctor or health care professional if you are unable to keep an appointment. What may interact with this medicine? -allopurinol -cimetidine -dapsone -digoxin -hydroxyurea -leucovorin -levamisole -medicines for seizures like ethotoin, fosphenytoin, phenytoin -medicines to increase blood counts like filgrastim, pegfilgrastim, sargramostim -medicines that treat or prevent blood clots like warfarin, enoxaparin, and dalteparin -methotrexate -metronidazole -pyrimethamine -some other chemotherapy drugs like busulfan, cisplatin, estramustine, vinblastine -trimethoprim -trimetrexate -vaccines Talk to your doctor or health care professional before taking any of these medicines: -acetaminophen -aspirin -ibuprofen -ketoprofen -naproxen This list may not describe all  possible interactions. Give your health care provider a list of all the medicines, herbs, non-prescription drugs, or dietary supplements you use. Also tell them if you smoke, drink alcohol, or use illegal drugs. Some items may interact with your medicine. What should I watch for while using this medicine? Visit your doctor for checks on your progress. This drug may make you feel generally unwell. This is not uncommon, as chemotherapy can affect healthy cells as well as cancer cells. Report any side effects. Continue your course of treatment even though you feel ill unless your doctor tells you to stop. In some cases, you may be given additional medicines to help with side effects. Follow all directions for their use. Call your doctor or health care professional for advice if you get a fever, chills or sore throat, or other symptoms of a cold or flu. Do  not treat yourself. This drug decreases your body's ability to fight infections. Try to avoid being around people who are sick. This medicine may increase your risk to bruise or bleed. Call your doctor or health care professional if you notice any unusual bleeding. Be careful brushing and flossing your teeth or using a toothpick because you may get an infection or bleed more easily. If you have any dental work done, tell your dentist you are receiving this medicine. Avoid taking products that contain aspirin, acetaminophen, ibuprofen, naproxen, or ketoprofen unless instructed by your doctor. These medicines may hide a fever. Do not become pregnant while taking this medicine. Women should inform their doctor if they wish to become pregnant or think they might be pregnant. There is a potential for serious side effects to an unborn child. Talk to your health care professional or pharmacist for more information. Do not breast-feed an infant while taking this medicine. Men should inform their doctor if they wish to father a child. This medicine may lower sperm counts. Do not treat diarrhea with over the counter products. Contact your doctor if you have diarrhea that lasts more than 2 days or if it is severe and watery. This medicine can make you more sensitive to the sun. Keep out of the sun. If you cannot avoid being in the sun, wear protective clothing and use sunscreen. Do not use sun lamps or tanning beds/booths. What side effects may I notice from receiving this medicine? Side effects that you should report to your doctor or health care professional as soon as possible: -allergic reactions like skin rash, itching or hives, swelling of the face, lips, or tongue -low blood counts - this medicine may decrease the number of white blood cells, red blood cells and platelets. You may be at increased risk for infections and bleeding. -signs of infection - fever or chills, cough, sore throat, pain or difficulty  passing urine -signs of decreased platelets or bleeding - bruising, pinpoint red spots on the skin, black, tarry stools, blood in the urine -signs of decreased red blood cells - unusually weak or tired, fainting spells, lightheadedness -breathing problems -changes in vision -chest pain -mouth sores -nausea and vomiting -pain, swelling, redness at site where injected -pain, tingling, numbness in the hands or feet -redness, swelling, or sores on hands or feet -stomach pain -unusual bleeding Side effects that usually do not require medical attention (report to your doctor or health care professional if they continue or are bothersome): -changes in finger or toe nails -diarrhea -dry or itchy skin -hair loss -headache -loss of appetite -sensitivity of eyes to the light -stomach  upset -unusually teary eyes This list may not describe all possible side effects. Call your doctor for medical advice about side effects. You may report side effects to FDA at 1-800-FDA-1088. Where should I keep my medicine? This drug is given in a hospital or clinic and will not be stored at home. NOTE: This sheet is a summary. It may not cover all possible information. If you have questions about this medicine, talk to your doctor, pharmacist, or health care provider.    2016, [employer] Standard. (2007-10-16 13:53:16) Cetuximab injection What is this medicine? CETUXIMAB (se TUX i mab) is a monoclonal antibody. It is used to treat colorectal cancer and head and neck cancer. This medicine may be used for other purposes; ask your health care provider or pharmacist if you have questions. What should I tell my health care provider before I take this medicine? They need to know if you have any of these conditions: -heart disease -history of irregular heartbeat -history of low levels of calcium, magnesium, or potassium in the blood -lung or breathing disease, like asthma -an unusual or allergic reaction to  cetuximab, other medicines, foods, dyes, or preservatives -pregnant or trying to get pregnant -breast-feeding How should I use this medicine? This drug is given as an infusion into a vein. It is administered in a hospital or clinic by a specially trained health care professional. Talk to your pediatrician regarding the use of this medicine in children. Special care may be needed. Overdosage: If you think you have taken too much of this medicine contact a poison control center or emergency room at once. NOTE: This medicine is only for you. Do not share this medicine with others. What if I miss a dose? It is important not to miss your dose. Call your doctor or health care professional if you are unable to keep an appointment. What may interact with this medicine? Interactions are not expected. This list may not describe all possible interactions. Give your health care provider a list of all the medicines, herbs, non-prescription drugs, or dietary supplements you use. Also tell them if you smoke, drink alcohol, or use illegal drugs. Some items may interact with your medicine. What should I watch for while using this medicine? Visit your doctor or health care professional for regular checks on your progress. This drug may make you feel generally unwell. This is not uncommon, as chemotherapy can affect healthy cells as well as cancer cells. Report any side effects. Continue your course of treatment even though you feel ill unless your doctor tells you to stop. This medicine can make you more sensitive to the sun. Keep out of the sun while taking this medicine and for 2 months after the last dose. If you cannot avoid being in the sun, wear protective clothing and use sunscreen. Do not use sun lamps or tanning beds/booths. You may need blood work done while you are taking this medicine. In some cases, you may be given additional medicines to help with side effects. Follow all directions for their  use. Call your doctor or health care professional for advice if you get a fever, chills or sore throat, or other symptoms of a cold or flu. Do not treat yourself. This drug decreases your body's ability to fight infections. Try to avoid being around people who are sick. Avoid taking products that contain aspirin, acetaminophen, ibuprofen, naproxen, or ketoprofen unless instructed by your doctor. These medicines may hide a fever. Do not become pregnant while taking this medicine.  Women should inform their doctor if they wish to become pregnant or think they might be pregnant. There is a potential for serious side effects to an unborn child. Use adequate birth control methods. Avoid pregnancy for at least 6 months after your last dose. Talk to your health care professional or pharmacist for more information. Do not breast-feed an infant while taking this medicine or during the 2 months after your last dose. What side effects may I notice from receiving this medicine? Side effects that you should report to your doctor or health care professional as soon as possible: -allergic reactions like skin rash, itching or hives, swelling of the face, lips, or tongue -breathing problems -changes in vision -fast, irregular heartbeat -feeling faint or lightheaded, falls -fever, chills -mouth sores -redness, blistering, peeling or loosening of the skin, including inside the mouth -trouble passing urine or change in the amount of urine -unusually weak or tired Side effects that usually do not require medical attention (report to your doctor or health care professional if they continue or are bothersome): -changes in skin like acne, cracks, skin dryness -constipation -diarrhea -headache -nail changes -nausea, vomiting -stomach upset -weight loss This list may not describe all possible side effects. Call your doctor for medical advice about side effects. You may report side effects to FDA at  1-800-FDA-1088. Where should I keep my medicine? This drug is given in a hospital or clinic and will not be stored at home. NOTE: This sheet is a summary. It may not cover all possible information. If you have questions about this medicine, talk to your doctor, pharmacist, or health care provider.    2016, [employer] Standard. (2014-08-19 22:27:08) Palonosetron Injection What is this medicine? PALONOSETRON (pal oh NOE se tron) is used to prevent nausea and vomiting caused by chemotherapy. It also helps prevent delayed nausea and vomiting that may occur a few days after your treatment. This medicine may be used for other purposes; ask your health care provider or pharmacist if you have questions. What should I tell my health care provider before I take this medicine? They need to know if you have any of these conditions: -an unusual or allergic reaction to palonosetron, dolasetron, granisetron, ondansetron, other medicines, foods, dyes, or preservatives -pregnant or trying to get pregnant -breast-feeding How should I use this medicine? This medicine is for infusion into a vein. It is given by a health care professional in a hospital or clinic setting. Talk to your pediatrician regarding the use of this medicine in children. While this drug may be prescribed for children as young as 1 month for selected conditions, precautions do apply. Overdosage: If you think you have taken too much of this medicine contact a poison control center or emergency room at once. NOTE: This medicine is only for you. Do not share this medicine with others. What if I miss a dose? This does not apply. What may interact with this medicine? -certain medicines for depression, anxiety, or psychotic disturbances -fentanyl -linezolid -MAOIs like Carbex, Eldepryl, Marplan, Nardil, and Parnate -methylene blue (injected into a vein) -tramadol This list may not describe all possible interactions. Give your health care  provider a list of all the medicines, herbs, non-prescription drugs, or dietary supplements you use. Also tell them if you smoke, drink alcohol, or use illegal drugs. Some items may interact with your medicine. What should I watch for while using this medicine? Your condition will be monitored carefully while you are receiving this medicine. What side effects  may I notice from receiving this medicine? Side effects that you should report to your doctor or health care professional as soon as possible: -allergic reactions like skin rash, itching or hives, swelling of the face, lips, or tongue -breathing problems -confusion -dizziness -fast, irregular heartbeat -fever and chills -loss of balance or coordination -seizures -sweating -swelling of the hands and feet -tremors -unusually weak or tired Side effects that usually do not require medical attention (report to your doctor or health care professional if they continue or are bothersome): -constipation or diarrhea -headache This list may not describe all possible side effects. Call your doctor for medical advice about side effects. You may report side effects to FDA at 1-800-FDA-1088. Where should I keep my medicine? This drug is given in a hospital or clinic and will not be stored at home. NOTE: This sheet is a summary. It may not cover all possible information. If you have questions about this medicine, talk to your doctor, pharmacist, or health care provider.    2016, [employer]. (2013-04-18 10:38:36) Dexamethasone injection What is this medicine? DEXAMETHASONE (dex a METH a sone) is a corticosteroid. It is used to treat inflammation of the skin, joints, lungs, and other organs. Common conditions treated include asthma, allergies, and arthritis. It is also used for other conditions, like blood disorders and diseases of the adrenal glands. This medicine may be used for other purposes; ask your health care provider or  pharmacist if you have questions. What should I tell my health care provider before I take this medicine? They need to know if you have any of these conditions: -blood clotting problems -Cushing's syndrome -diabetes -glaucoma -heart problems or disease -high blood pressure -infection like herpes, measles, tuberculosis, or chickenpox -kidney disease -liver disease -mental problems -myasthenia gravis -osteoporosis -previous heart attack -seizures -stomach, ulcer or intestine disease including colitis and diverticulitis -thyroid problem -an unusual or allergic reaction to dexamethasone, corticosteroids, other medicines, lactose, foods, dyes, or preservatives -pregnant or trying to get pregnant -breast-feeding How should I use this medicine? This medicine is for injection into a muscle, joint, lesion, soft tissue, or vein. It is given by a health care professional in a hospital or clinic setting. Talk to your pediatrician regarding the use of this medicine in children. Special care may be needed. Overdosage: If you think you have taken too much of this medicine contact a poison control center or emergency room at once. NOTE: This medicine is only for you. Do not share this medicine with others. What if I miss a dose? This may not apply. If you are having a series of injections over a prolonged period, try not to miss an appointment. Call your doctor or health care professional to reschedule if you are unable to keep an appointment. What may interact with this medicine? Do not take this medicine with any of the following medications: -mifepristone, RU-486 -vaccines This medicine may also interact with the following medications: -amphotericin B -antibiotics like clarithromycin, erythromycin, and troleandomycin -aspirin and aspirin-like drugs -barbiturates like phenobarbital -carbamazepine -cholestyramine -cholinesterase inhibitors like donepezil, galantamine, rivastigmine, and  tacrine -cyclosporine -digoxin -diuretics -ephedrine -female hormones, like estrogens or progestins and birth control pills -indinavir -isoniazid -ketoconazole -medicines for diabetes -medicines that improve muscle tone or strength for conditions like myasthenia gravis -NSAIDs, medicines for pain and inflammation, like ibuprofen or naproxen -phenytoin -rifampin -thalidomide -warfarin This list may not describe all possible interactions. Give your health care provider a list of all the medicines, herbs, non-prescription drugs, or  dietary supplements you use. Also tell them if you smoke, drink alcohol, or use illegal drugs. Some items may interact with your medicine. What should I watch for while using this medicine? Your condition will be monitored carefully while you are receiving this medicine. If you are taking this medicine for a long time, carry an identification card with your name and address, the type and dose of your medicine, and your doctor's name and address. This medicine may increase your risk of getting an infection. Stay away from people who are sick. Tell your doctor or health care professional if you are around anyone with measles or chickenpox. Talk to your health care provider before you get any vaccines that you take this medicine. If you are going to have surgery, tell your doctor or health care professional that you have taken this medicine within the last twelve months. Ask your doctor or health care professional about your diet. You may need to lower the amount of salt you eat. The medicine can increase your blood sugar. If you are a diabetic check with your doctor if you need help adjusting the dose of your diabetic medicine. What side effects may I notice from receiving this medicine? Side effects that you should report to your doctor or health care professional as soon as possible: -allergic reactions like skin rash, itching or hives, swelling of the face, lips,  or tongue -black or tarry stools -change in the amount of urine -changes in vision -confusion, excitement, restlessness, a false sense of well-being -fever, sore throat, sneezing, cough, or other signs of infection, wounds that will not heal -hallucinations -increased thirst -mental depression, mood swings, mistaken feelings of self importance or of being mistreated -pain in hips, back, ribs, arms, shoulders, or legs -pain, redness, or irritation at the injection site -redness, blistering, peeling or loosening of the skin, including inside the mouth -rounding out of face -swelling of feet or lower legs -unusual bleeding or bruising -unusual tired or weak -wounds that do not heal Side effects that usually do not require medical attention (report to your doctor or health care professional if they continue or are bothersome): -diarrhea or constipation -change in taste -headache -nausea, vomiting -skin problems, acne, thin and shiny skin -touble sleeping -unusual growth of hair on the face or body -weight gain This list may not describe all possible side effects. Call your doctor for medical advice about side effects. You may report side effects to FDA at 1-800-FDA-1088. Where should I keep my medicine? This drug is given in a hospital or clinic and will not be stored at home. NOTE: This sheet is a summary. It may not cover all possible information. If you have questions about this medicine, talk to your doctor, pharmacist, or health care provider.    2016, [employer]. (2007-10-03 14:04:12) Diphenhydramine capsules or tablets What is this medicine? DIPHENHYDRAMINE (dye fen HYE dra meen) is an antihistamine. It is used to treat the symptoms of an allergic reaction. It is also used to treat Parkinson's disease. This medicine is also used to prevent and to treat motion sickness and as a nighttime sleep aid. This medicine may be used for other purposes; ask your health care  provider or pharmacist if you have questions. What should I tell my health care provider before I take this medicine? They need to know if you have any of these conditions: -asthma or lung disease -glaucoma -high blood pressure or heart disease -liver disease -pain or difficulty passing urine -prostate  trouble -ulcers or other stomach problems -an unusual or allergic reaction to diphenhydramine, other medicines foods, dyes, or preservatives such as sulfites -pregnant or trying to get pregnant -breast-feeding How should I use this medicine? Take this medicine by mouth with a full glass of water. Follow the directions on the prescription label. Take your doses at regular intervals. Do not take your medicine more often than directed. To prevent motion sickness start taking this medicine 30 to 60 minutes before you leave. Talk to your pediatrician regarding the use of this medicine in children. Special care may be needed. Patients over 64 years old may have a stronger reaction and need a smaller dose. Overdosage: If you think you have taken too much of this medicine contact a poison control center or emergency room at once. NOTE: This medicine is only for you. Do not share this medicine with others. What if I miss a dose? If you miss a dose, take it as soon as you can. If it is almost time for your next dose, take only that dose. Do not take double or extra doses. What may interact with this medicine? Do not take this medicine with any of the following medications: -MAOIs like Carbex, Eldepryl, Marplan, Nardil, and Parnate This medicine may also interact with the following medications: -alcohol -barbiturates, like phenobarbital -medicines for bladder spasm like oxybutynin, tolterodine -medicines for blood pressure -medicines for depression, anxiety, or psychotic disturbances -medicines for movement abnormalities or Parkinson's disease -medicines for sleep -other medicines for cold, cough  or allergy -some medicines for the stomach like chlordiazepoxide, dicyclomine This list may not describe all possible interactions. Give your health care provider a list of all the medicines, herbs, non-prescription drugs, or dietary supplements you use. Also tell them if you smoke, drink alcohol, or use illegal drugs. Some items may interact with your medicine. What should I watch for while using this medicine? Visit your doctor or health care professional for regular check ups. Tell your doctor if your symptoms do not improve or if they get worse. Your mouth may get dry. Chewing sugarless gum or sucking hard candy, and drinking plenty of water may help. Contact your doctor if the problem does not go away or is severe. This medicine may cause dry eyes and blurred vision. If you wear contact lenses you may feel some discomfort. Lubricating drops may help. See your eye doctor if the problem does not go away or is severe. You may get drowsy or dizzy. Do not drive, use machinery, or do anything that needs mental alertness until you know how this medicine affects you. Do not stand or sit up quickly, especially if you are an older patient. This reduces the risk of dizzy or fainting spells. Alcohol may interfere with the effect of this medicine. Avoid alcoholic drinks. What side effects may I notice from receiving this medicine? Side effects that you should report to your doctor or health care professional as soon as possible: -allergic reactions like skin rash, itching or hives, swelling of the face, lips, or tongue -changes in vision -confused, agitated, nervous -irregular or fast heartbeat -tremor -trouble passing urine -unusual bleeding or bruising -unusually weak or tired Side effects that usually do not require medical attention (report to your doctor or health care professional if they continue or are bothersome): -constipation, diarrhea -drowsy -headache -loss of appetite -stomach upset,  vomiting -thick mucous This list may not describe all possible side effects. Call your doctor for medical advice about side effects. You  may report side effects to FDA at 1-800-FDA-1088. Where should I keep my medicine? Keep out of the reach of children. Store at room temperature between 15 and 30 degrees C (59 and 86 degrees F). Keep container closed tightly. Throw away any unused medicine after the expiration date. NOTE: This sheet is a summary. It may not cover all possible information. If you have questions about this medicine, talk to your doctor, pharmacist, or health care provider.    2016, [employer]. (2007-09-30 17:06:22) Prochlorperazine tablets What is this medicine? PROCHLORPERAZINE (proe klor PER a zeen) helps to control severe nausea and vomiting. This medicine is also used to treat schizophrenia. It can also help patients who experience anxiety that is not due to psychological illness. This medicine may be used for other purposes; ask your health care provider or pharmacist if you have questions. What should I tell my health care provider before I take this medicine? They need to know if you have any of these conditions: -blood disorders or disease -dementia -liver disease or jaundice -Parkinson's disease -uncontrollable movement disorder -an unusual or allergic reaction to prochlorperazine, other medicines, foods, dyes, or preservatives -pregnant or trying to get pregnant -breast-feeding How should I use this medicine? Take this medicine by mouth with a glass of water. Follow the directions on the prescription label. Take your doses at regular intervals. Do not take your medicine more often than directed. Do not stop taking this medicine suddenly. This can cause nausea, vomiting, and dizziness. Ask your doctor or health care professional for advice. Talk to your pediatrician regarding the use of this medicine in children. Special care may be needed. While this  drug may be prescribed for children as young as 2 years for selected conditions, precautions do apply. Overdosage: If you think you have taken too much of this medicine contact a poison control center or emergency room at once. NOTE: This medicine is only for you. Do not share this medicine with others. What if I miss a dose? If you miss a dose, take it as soon as you can. If it is almost time for your next dose, take only that dose. Do not take double or extra doses. What may interact with this medicine? Do not take this medicine with any of the following medications: -amoxapine -antidepressants like citalopram, escitalopram, fluoxetine, paroxetine, and sertraline -deferoxamine -dofetilide -maprotiline -tricyclic antidepressants like amitriptyline, clomipramine, imipramine, nortiptyline and others This medicine may also interact with the following medications: -lithium -medicines for pain -phenytoin -propranolol -warfarin This list may not describe all possible interactions. Give your health care provider a list of all the medicines, herbs, non-prescription drugs, or dietary supplements you use. Also tell them if you smoke, drink alcohol, or use illegal drugs. Some items may interact with your medicine. What should I watch for while using this medicine? Visit your doctor or health care professional for regular checks on your progress. You may get drowsy or dizzy. Do not drive, use machinery, or do anything that needs mental alertness until you know how this medicine affects you. Do not stand or sit up quickly, especially if you are an older patient. This reduces the risk of dizzy or fainting spells. Alcohol may interfere with the effect of this medicine. Avoid alcoholic drinks. This medicine can reduce the response of your body to heat or cold. Dress warm in cold weather and stay hydrated in hot weather. If possible, avoid extreme temperatures like saunas, hot tubs, very hot or cold showers,  or  activities that can cause dehydration such as vigorous exercise. This medicine can make you more sensitive to the sun. Keep out of the sun. If you cannot avoid being in the sun, wear protective clothing and use sunscreen. Do not use sun lamps or tanning beds/booths. Your mouth may get dry. Chewing sugarless gum or sucking hard candy, and drinking plenty of water may help. Contact your doctor if the problem does not go away or is severe. What side effects may I notice from receiving this medicine? Side effects that you should report to your doctor or health care professional as soon as possible: -blurred vision -breast enlargement in men or women -breast milk in women who are not breast-feeding -chest pain, fast or irregular heartbeat -confusion, restlessness -dark yellow or brown urine -difficulty breathing or swallowing -dizziness or fainting spells -drooling, shaking, movement difficulty (shuffling walk) or rigidity -fever, chills, sore throat -involuntary or uncontrollable movements of the eyes, mouth, head, arms, and legs -seizures -stomach area pain -unusually weak or tired -unusual bleeding or bruising -yellowing of skin or eyes Side effects that usually do not require medical attention (report to your doctor or health care professional if they continue or are bothersome): -difficulty passing urine -difficulty sleeping -headache -sexual dysfunction -skin rash, or itching This list may not describe all possible side effects. Call your doctor for medical advice about side effects. You may report side effects to FDA at 1-800-FDA-1088. Where should I keep my medicine? Keep out of the reach of children. Store at room temperature between 15 and 30 degrees C (59 and 86 degrees F). Protect from light. Throw away any unused medicine after the expiration date. NOTE: This sheet is a summary. It may not cover all possible information. If you have questions about this medicine, talk to  your doctor, pharmacist, or health care provider.    2016, [employer]. (2011-10-31 16:59:39) Lidocaine; Prilocaine cream What is this medicine? LIDOCAINE; PRILOCAINE (LYE doe kane; PRIL oh kane) is a topical anesthetic that causes loss of feeling in the skin and surrounding tissues. It is used to numb the skin before procedures or injections. This medicine may be used for other purposes; ask your health care provider or pharmacist if you have questions. What should I tell my health care provider before I take this medicine? They need to know if you have any of these conditions: -glucose-6-phosphate deficiencies -heart disease -kidney or liver disease -methemoglobinemia -an unusual or allergic reaction to lidocaine, prilocaine, other medicines, foods, dyes, or preservatives -pregnant or trying to get pregnant -breast-feeding How should I use this medicine? This medicine is for external use only on the skin. Do not take by mouth. Follow the directions on the prescription label. Wash hands before and after use. Do not use more or leave in contact with the skin longer than directed. Do not apply to eyes or open wounds. It can cause irritation and blurred or temporary loss of vision. If this medicine comes in contact with your eyes, immediately rinse the eye with water. Do not touch or rub the eye. Contact your health care provider right away. Talk to your pediatrician regarding the use of this medicine in children. While this medicine may be prescribed for children for selected conditions, precautions do apply. Overdosage: If you think you have taken too much of this medicine contact a poison control center or emergency room at once. NOTE: This medicine is only for you. Do not share this medicine with others. What if I miss a  dose? This medicine is usually only applied once prior to each procedure. It must be in contact with the skin for a period of time for it to work. If you applied  this medicine later than directed, tell your health care professional before starting the procedure. What may interact with this medicine? -acetaminophen -chloroquine -dapsone -medicines to control heart rhythm -nitrates like nitroglycerin and nitroprusside -other ointments, creams, or sprays that may contain anesthetic medicine -phenobarbital -phenytoin -quinine -sulfonamides like sulfacetamide, sulfamethoxazole, sulfasalazine and others This list may not describe all possible interactions. Give your health care provider a list of all the medicines, herbs, non-prescription drugs, or dietary supplements you use. Also tell them if you smoke, drink alcohol, or use illegal drugs. Some items may interact with your medicine. What should I watch for while using this medicine? Be careful to avoid injury to the treated area while it is numb and you are not aware of pain. Avoid scratching, rubbing, or exposing the treated area to hot or cold temperatures until complete sensation has returned. The numb feeling will wear off a few hours after applying the cream. What side effects may I notice from receiving this medicine? Side effects that you should report to your doctor or health care professional as soon as possible: -blurred vision -chest pain -difficulty breathing -dizziness -drowsiness -fast or irregular heartbeat -skin rash or itching -swelling of your throat, lips, or face -trembling Side effects that usually do not require medical attention (report to your doctor or health care professional if they continue or are bothersome): -changes in ability to feel hot or cold -redness and swelling at the application site This list may not describe all possible side effects. Call your doctor for medical advice about side effects. You may report side effects to FDA at 1-800-FDA-1088. Where should I keep my medicine? Keep out of reach of children. Store at room temperature between 15 and 30 degrees  C (59 and 86 degrees F). Keep container tightly closed. Throw away any unused medicine after the expiration date. NOTE: This sheet is a summary. It may not cover all possible information. If you have questions about this medicine, talk to your doctor, pharmacist, or health care provider.    2016, [employer]. (2007-12-16 17:14:35)

## 2015-10-21 ENCOUNTER — Ambulatory Visit (HOSPITAL_COMMUNITY): Payer: Self-pay | Admitting: Psychiatry

## 2015-10-22 ENCOUNTER — Encounter (HOSPITAL_COMMUNITY): Payer: Self-pay

## 2015-10-22 ENCOUNTER — Encounter (HOSPITAL_COMMUNITY): Payer: Medicaid Other

## 2015-10-22 ENCOUNTER — Encounter (HOSPITAL_BASED_OUTPATIENT_CLINIC_OR_DEPARTMENT_OTHER): Payer: Medicaid Other

## 2015-10-22 VITALS — BP 127/85 | HR 88 | Temp 98.5°F | Resp 18 | Wt 106.2 lb

## 2015-10-22 DIAGNOSIS — C01 Malignant neoplasm of base of tongue: Secondary | ICD-10-CM

## 2015-10-22 DIAGNOSIS — C7800 Secondary malignant neoplasm of unspecified lung: Secondary | ICD-10-CM

## 2015-10-22 DIAGNOSIS — C140 Malignant neoplasm of pharynx, unspecified: Secondary | ICD-10-CM

## 2015-10-22 DIAGNOSIS — Z5111 Encounter for antineoplastic chemotherapy: Secondary | ICD-10-CM

## 2015-10-22 MED ORDER — SODIUM CHLORIDE 0.9 % IV SOLN
Freq: Once | INTRAVENOUS | Status: AC
  Administered 2015-10-22: 09:00:00 via INTRAVENOUS

## 2015-10-22 MED ORDER — DIPHENHYDRAMINE HCL 50 MG/ML IJ SOLN
50.0000 mg | Freq: Once | INTRAMUSCULAR | Status: AC
  Administered 2015-10-22: 50 mg via INTRAVENOUS

## 2015-10-22 MED ORDER — SODIUM CHLORIDE 0.9 % IV SOLN
10.0000 mg | Freq: Once | INTRAVENOUS | Status: AC
  Administered 2015-10-22: 10 mg via INTRAVENOUS
  Filled 2015-10-22: qty 1

## 2015-10-22 MED ORDER — SODIUM CHLORIDE 0.9 % IV SOLN
317.5000 mg | Freq: Once | INTRAVENOUS | Status: AC
  Administered 2015-10-22: 320 mg via INTRAVENOUS
  Filled 2015-10-22: qty 32

## 2015-10-22 MED ORDER — DIPHENHYDRAMINE HCL 50 MG/ML IJ SOLN
INTRAMUSCULAR | Status: AC
Start: 2015-10-22 — End: 2015-10-22
  Filled 2015-10-22: qty 1

## 2015-10-22 MED ORDER — PALONOSETRON HCL INJECTION 0.25 MG/5ML
INTRAVENOUS | Status: AC
  Filled 2015-10-22: qty 5

## 2015-10-22 MED ORDER — SODIUM CHLORIDE 0.9% FLUSH: 10.0000 mL | INTRAVENOUS | Status: DC | PRN

## 2015-10-22 MED ORDER — PALONOSETRON HCL INJECTION 0.25 MG/5ML
0.2500 mg | Freq: Once | INTRAVENOUS | Status: AC
  Administered 2015-10-22: 0.25 mg via INTRAVENOUS

## 2015-10-22 MED ORDER — CETUXIMAB CHEMO IV INJECTION 200 MG/100ML
400.0000 mg/m2 | Freq: Once | INTRAVENOUS | Status: AC
  Administered 2015-10-22: 600 mg via INTRAVENOUS
  Filled 2015-10-22: qty 300

## 2015-10-22 MED ORDER — SODIUM CHLORIDE 0.9 % IV SOLN
1000.0000 mg/m2/d | INTRAVENOUS | Status: DC
  Administered 2015-10-22: 6050 mg via INTRAVENOUS
  Filled 2015-10-22: qty 100

## 2015-10-22 NOTE — Patient Instructions (Signed)
[facility] Discharge Instructions for Patients Receiving Chemotherapy   Beginning January 23rd 2017 lab work for the [facility] will be done in the  Main lab at [facility] on 1st floor. If you have a lab appointment with the [facility] please come in thru the  Main Entrance and check in at the main information desk   Today you received the following chemotherapy agents carboplatin, 52fu, and erbitux  To help prevent nausea and vomiting after your treatment, we encourage you to take your nausea medication  If you develop nausea and vomiting, or diarrhea that is not controlled by your medication, call the clinic.  The clinic phone number is [phone]. Office hours are Monday-Friday 8:30am-5:00pm.  BELOW ARE SYMPTOMS THAT SHOULD BE REPORTED IMMEDIATELY:  *FEVER GREATER THAN 101.0 F  *CHILLS WITH OR WITHOUT FEVER  NAUSEA AND VOMITING THAT IS NOT CONTROLLED WITH YOUR NAUSEA MEDICATION  *UNUSUAL SHORTNESS OF BREATH  *UNUSUAL BRUISING OR BLEEDING  TENDERNESS IN MOUTH AND THROAT WITH OR WITHOUT PRESENCE OF ULCERS  *URINARY PROBLEMS  *BOWEL PROBLEMS  UNUSUAL RASH Items with * indicate a potential emergency and should be followed up as soon as possible. If you have an emergency after office hours please contact your primary care physician or go to the nearest emergency department.  Please call the clinic during office hours if you have any questions or concerns.   You may also contact the Patient Navigator at [phone] should you have any questions or need assistance in obtaining follow up care.      Resources For Cancer Patients and their Caregivers ? American Cancer Society: Can assist with transportation, wigs, general needs, runs Look Good Feel Better.        ([phone] ? Cancer Care: Provides financial assistance, online support groups, medication/co-pay assistance.  1-800-813-HOPE 716-635-5723) ? [place] Assists  [place] cancer patients and their families through emotional , educational and financial support.  725-623-2377 ? [place] DSS Where to apply for food stamps, Medicaid and utility assistance. 203-533-4324 ? RCATS: Transportation to medical appointments. 7748559765 ? Social Security Administration: May apply for disability if have a Stage IV cancer. [phone] ? [employer], Disability and Transit Services: Assists with nutrition, care and transit needs. [phone]

## 2015-10-22 NOTE — Progress Notes (Signed)
Continuous infusion 5 fu pump education performed and patient verbalized understanding

## 2015-10-22 NOTE — Progress Notes (Signed)
Tolerated well

## 2015-10-22 NOTE — Progress Notes (Signed)
Chemo teaching done and consent signed for Erbitux, Carboplatin, and 5FU. Distress screening done. Erbitux kit given to patient. Calendar given to patient.

## 2015-10-26 ENCOUNTER — Encounter: Payer: Self-pay | Admitting: *Deleted

## 2015-10-26 ENCOUNTER — Encounter (HOSPITAL_COMMUNITY): Payer: Medicaid Other | Attending: Hematology & Oncology

## 2015-10-26 VITALS — BP 115/78 | HR 94 | Temp 98.0°F | Resp 18

## 2015-10-26 DIAGNOSIS — C01 Malignant neoplasm of base of tongue: Secondary | ICD-10-CM | POA: Insufficient documentation

## 2015-10-26 DIAGNOSIS — Z5189 Encounter for other specified aftercare: Secondary | ICD-10-CM

## 2015-10-26 DIAGNOSIS — Z87891 Personal history of nicotine dependence: Secondary | ICD-10-CM | POA: Insufficient documentation

## 2015-10-26 DIAGNOSIS — C7800 Secondary malignant neoplasm of unspecified lung: Secondary | ICD-10-CM

## 2015-10-26 MED ORDER — PEGFILGRASTIM 6 MG/0.6ML ~~LOC~~ PSKT
6.0000 mg | PREFILLED_SYRINGE | Freq: Once | SUBCUTANEOUS | Status: AC
  Administered 2015-10-26: 6 mg via SUBCUTANEOUS

## 2015-10-26 MED ORDER — HEPARIN SOD (PORK) LOCK FLUSH 100 UNIT/ML IV SOLN
INTRAVENOUS | Status: AC
  Filled 2015-10-26: qty 5

## 2015-10-26 MED ORDER — HEPARIN SOD (PORK) LOCK FLUSH 100 UNIT/ML IV SOLN
500.0000 [IU] | Freq: Once | INTRAVENOUS | Status: AC | PRN
  Administered 2015-10-26: 500 [IU]

## 2015-10-26 MED ORDER — SODIUM CHLORIDE 0.9% FLUSH
10.0000 mL | INTRAVENOUS | Status: DC | PRN
  Administered 2015-10-26: 10 mL
  Filled 2015-10-26: qty 10

## 2015-10-26 MED ORDER — PEGFILGRASTIM 6 MG/0.6ML ~~LOC~~ PSKT
PREFILLED_SYRINGE | SUBCUTANEOUS | Status: AC
  Filled 2015-10-26: qty 0.6

## 2015-10-26 NOTE — Progress Notes (Signed)
[facility] Psychosocial Distress Screening Clinical Social Work  Clinical Social Work was referred by distress screening protocol.  The patient scored a 8 on the Psychosocial Distress Thermometer which indicates severe distress. Clinical Social Worker phoned pt at home to assess for distress and other psychosocial needs. CSW has spoken to pt in the past and pt remembered CSW from previous calls. Pt reports she is adequately adjusting to more treatment and denies current concerns. CSW reviewed options for additional support such as group. Pt declines due to distance, as she lives in [place]. Pt provided with all CSW contact numbers. Pt agrees to reach out as needed.  ONCBCN DISTRESS SCREENING 10/22/2015  Screening Type Initial Screening  Distress experienced in past week (1-10) 8  Practical problem type   Emotional problem type Nervousness/Anxiety;Adjusting to illness  Spiritual/Religous concerns type Facing my mortality  Information Concerns Type   Physical Problem type   Physician notified of physical symptoms   Referral to clinical social work Yes  Referral to financial advocate   Other     Clinical Social Worker follow up needed: No.  If yes, follow up plan: Loren Racer, [place] Tuesdays   [phone]

## 2015-10-26 NOTE — Progress Notes (Signed)
Patient presents for removal of home infusion pump and port flush.  Port to right chest disconnected from home infusion pump, flushed with 66ml normal saline, and 500 units of heparin per protocol.  Port de-accessed and dressing applied, no active bleeding or s/s skin irritation from infusion.    Neulasta OnPro applied to left upper arm.  Patient educated on why and what it was for, and how to remove and discard.

## 2015-10-26 NOTE — Patient Instructions (Signed)
[place] at [facility] Discharge Instructions  RECOMMENDATIONS MADE BY THE CONSULTANT AND ANY TEST RESULTS WILL BE SENT TO YOUR REFERRING PHYSICIAN.  Home infusion pump removed.  Neulasta OnPro applied.  Please remove this tomorrow evening after it beeps and infuses.    Thank you for choosing [place] at [facility] to provide your oncology and hematology care.  To afford each patient quality time with our provider, please arrive at least 15 minutes before your scheduled appointment time.   Beginning January 23rd 2017 lab work for the [facility] will be done in the  Main lab at [facility] on 1st floor. If you have a lab appointment with the [facility] please come in thru the  Main Entrance and check in at the main information desk  You need to re-schedule your appointment should you arrive 10 or more minutes late.  We strive to give you quality time with our providers, and arriving late affects you and other patients whose appointments are after yours.  Also, if you no show three or more times for appointments you may be dismissed from the clinic at the providers discretion.     Again, thank you for choosing [facility].  Our hope is that these requests will decrease the amount of time that you wait before being seen by our physicians.       _____________________________________________________________  Should you have questions after your visit to [facility], please contact our office at [phone] between the hours of 8:30 a.m. and 4:30 p.m.  Voicemails left after 4:30 p.m. will not be returned until the following business day.  For prescription refill requests, have your pharmacy contact our office.         Resources For Cancer Patients and their Caregivers ? American Cancer Society: Can assist with transportation, wigs, general needs, runs Look Good Feel Better.        479-628-3389 ? Cancer  Care: Provides financial assistance, online support groups, medication/co-pay assistance.  1-800-813-HOPE 636-297-5646) ? [place] Assists [place] cancer patients and their families through emotional , educational and financial support.  419-355-6524 ? [place] DSS Where to apply for food stamps, Medicaid and utility assistance. 248-296-0323 ? RCATS: Transportation to medical appointments. [phone] ? Social Security Administration: May apply for disability if have a Stage IV cancer. [phone] ? [employer], Disability and Transit Services: Assists with nutrition, care and transit needs. [phone]

## 2015-10-27 ENCOUNTER — Ambulatory Visit (HOSPITAL_COMMUNITY): Payer: Self-pay | Admitting: Hematology & Oncology

## 2015-10-27 ENCOUNTER — Other Ambulatory Visit (HOSPITAL_COMMUNITY): Payer: Self-pay

## 2015-10-28 ENCOUNTER — Encounter (HOSPITAL_BASED_OUTPATIENT_CLINIC_OR_DEPARTMENT_OTHER): Payer: Medicaid Other | Admitting: Hematology & Oncology

## 2015-10-28 ENCOUNTER — Other Ambulatory Visit (HOSPITAL_COMMUNITY): Payer: Self-pay | Admitting: Oncology

## 2015-10-28 ENCOUNTER — Encounter (HOSPITAL_COMMUNITY): Payer: Self-pay | Admitting: Hematology & Oncology

## 2015-10-28 ENCOUNTER — Encounter (HOSPITAL_BASED_OUTPATIENT_CLINIC_OR_DEPARTMENT_OTHER): Payer: Medicaid Other

## 2015-10-28 VITALS — BP 111/70 | HR 81 | Temp 97.9°F | Resp 18 | Wt 101.6 lb

## 2015-10-28 VITALS — BP 107/75 | HR 77 | Temp 98.9°F | Resp 18

## 2015-10-28 DIAGNOSIS — D631 Anemia in chronic kidney disease: Secondary | ICD-10-CM

## 2015-10-28 DIAGNOSIS — R634 Abnormal weight loss: Secondary | ICD-10-CM | POA: Diagnosis not present

## 2015-10-28 DIAGNOSIS — C7800 Secondary malignant neoplasm of unspecified lung: Secondary | ICD-10-CM

## 2015-10-28 DIAGNOSIS — Z87891 Personal history of nicotine dependence: Secondary | ICD-10-CM | POA: Diagnosis not present

## 2015-10-28 DIAGNOSIS — Z170 Estrogen receptor positive status [ER+]: Secondary | ICD-10-CM | POA: Diagnosis not present

## 2015-10-28 DIAGNOSIS — K5900 Constipation, unspecified: Secondary | ICD-10-CM

## 2015-10-28 DIAGNOSIS — K117 Disturbances of salivary secretion: Secondary | ICD-10-CM

## 2015-10-28 DIAGNOSIS — C01 Malignant neoplasm of base of tongue: Secondary | ICD-10-CM

## 2015-10-28 DIAGNOSIS — E871 Hypo-osmolality and hyponatremia: Secondary | ICD-10-CM | POA: Diagnosis not present

## 2015-10-28 DIAGNOSIS — N189 Chronic kidney disease, unspecified: Secondary | ICD-10-CM

## 2015-10-28 DIAGNOSIS — C140 Malignant neoplasm of pharynx, unspecified: Secondary | ICD-10-CM

## 2015-10-28 DIAGNOSIS — G893 Neoplasm related pain (acute) (chronic): Secondary | ICD-10-CM

## 2015-10-28 DIAGNOSIS — C7802 Secondary malignant neoplasm of left lung: Secondary | ICD-10-CM

## 2015-10-28 DIAGNOSIS — Z5112 Encounter for antineoplastic immunotherapy: Secondary | ICD-10-CM

## 2015-10-28 LAB — CBC WITH DIFFERENTIAL/PLATELET
BASOS PCT: 0 %
Basophils Absolute: 0 10*3/uL (ref 0.0–0.1)
EOS ABS: 0 10*3/uL (ref 0.0–0.7)
EOS PCT: 0 %
HEMATOCRIT: 28.8 % — AB (ref 36.0–46.0)
Hemoglobin: 9.7 g/dL — ABNORMAL LOW (ref 12.0–15.0)
Lymphocytes Relative: 2 %
Lymphs Abs: 0.4 10*3/uL — ABNORMAL LOW (ref 0.7–4.0)
MCH: 31 pg (ref 26.0–34.0)
MCHC: 33.7 g/dL (ref 30.0–36.0)
MCV: 92 fL (ref 78.0–100.0)
MONO ABS: 0.3 10*3/uL (ref 0.1–1.0)
MONOS PCT: 2 %
NEUTROS ABS: 18.7 10*3/uL — AB (ref 1.7–7.7)
Neutrophils Relative %: 96 %
Platelets: 311 10*3/uL (ref 150–400)
RBC: 3.13 MIL/uL — ABNORMAL LOW (ref 3.87–5.11)
RDW: 15 % (ref 11.5–15.5)
WBC: 19.5 10*3/uL — ABNORMAL HIGH (ref 4.0–10.5)

## 2015-10-28 LAB — COMPREHENSIVE METABOLIC PANEL
ALBUMIN: 3.4 g/dL — AB (ref 3.5–5.0)
ALT: 10 U/L — ABNORMAL LOW (ref 14–54)
ANION GAP: 11 (ref 5–15)
AST: 23 U/L (ref 15–41)
Alkaline Phosphatase: 117 U/L (ref 38–126)
BILIRUBIN TOTAL: 0.7 mg/dL (ref 0.3–1.2)
BUN: 35 mg/dL — ABNORMAL HIGH (ref 6–20)
CO2: 28 mmol/L (ref 22–32)
Calcium: 8.8 mg/dL — ABNORMAL LOW (ref 8.9–10.3)
Chloride: 90 mmol/L — ABNORMAL LOW (ref 101–111)
Creatinine, Ser: 1.34 mg/dL — ABNORMAL HIGH (ref 0.44–1.00)
GFR calc Af Amer: 53 mL/min — ABNORMAL LOW (ref >60)
GFR calc non Af Amer: 46 mL/min — ABNORMAL LOW (ref >60)
GLUCOSE: 106 mg/dL — AB (ref 65–99)
POTASSIUM: 3.7 mmol/L (ref 3.5–5.1)
Sodium: 129 mmol/L — ABNORMAL LOW (ref 135–145)
TOTAL PROTEIN: 7.4 g/dL (ref 6.5–8.1)

## 2015-10-28 LAB — MAGNESIUM: MAGNESIUM: 1.8 mg/dL (ref 1.7–2.4)

## 2015-10-28 MED ORDER — HYDROCODONE-ACETAMINOPHEN 7.5-500 MG/15ML PO SOLN: 15.0000 mL | ORAL | Status: DC | PRN

## 2015-10-28 MED ORDER — LINACLOTIDE 145 MCG PO CAPS: 145.0000 ug | ORAL_CAPSULE | Freq: Every day | ORAL | Status: AC

## 2015-10-28 MED ORDER — FIRST-DUKES MOUTHWASH MT SUSP: OROMUCOSAL | Status: DC

## 2015-10-28 MED ORDER — CETUXIMAB CHEMO IV INJECTION 200 MG/100ML
400.0000 mg/m2 | Freq: Once | INTRAVENOUS | Status: AC
  Administered 2015-10-28: 600 mg via INTRAVENOUS
  Filled 2015-10-28: qty 300

## 2015-10-28 MED ORDER — FENTANYL 100 MCG/HR TD PT72: 200.0000 ug | MEDICATED_PATCH | TRANSDERMAL | Status: DC

## 2015-10-28 MED ORDER — DIPHENHYDRAMINE HCL 50 MG/ML IJ SOLN
INTRAMUSCULAR | Status: AC
  Filled 2015-10-28: qty 1

## 2015-10-28 MED ORDER — FIRST-DUKES MOUTHWASH MT SUSP: OROMUCOSAL | Status: AC

## 2015-10-28 MED ORDER — DIPHENHYDRAMINE HCL 50 MG/ML IJ SOLN
50.0000 mg | Freq: Once | INTRAMUSCULAR | Status: AC
  Administered 2015-10-28: 50 mg via INTRAVENOUS

## 2015-10-28 MED ORDER — SODIUM CHLORIDE 0.9% FLUSH
10.0000 mL | INTRAVENOUS | Status: DC | PRN
  Administered 2015-10-28: 10 mL
  Filled 2015-10-28: qty 10

## 2015-10-28 MED ORDER — SODIUM CHLORIDE 0.9 % IV SOLN
Freq: Once | INTRAVENOUS | Status: AC
  Administered 2015-10-28: 09:00:00 via INTRAVENOUS

## 2015-10-28 MED ORDER — HEPARIN SOD (PORK) LOCK FLUSH 100 UNIT/ML IV SOLN
500.0000 [IU] | Freq: Once | INTRAVENOUS | Status: AC | PRN
  Administered 2015-10-28: 500 [IU]

## 2015-10-28 NOTE — Patient Instructions (Signed)
[facility] Discharge Instructions for Patients Receiving Chemotherapy   Beginning January 23rd 2017 lab work for the [facility] will be done in the  Main lab at [facility] on 1st floor. If you have a lab appointment with the [facility] please come in thru the  Main Entrance and check in at the main information desk   Today you received the following chemotherapy agents:  Erbitux  If you develop nausea and vomiting, or diarrhea that is not controlled by your medication, call the clinic.  The clinic phone number is [phone]. Office hours are Monday-Friday 8:30am-5:00pm.  BELOW ARE SYMPTOMS THAT SHOULD BE REPORTED IMMEDIATELY:  *FEVER GREATER THAN 101.0 F  *CHILLS WITH OR WITHOUT FEVER  NAUSEA AND VOMITING THAT IS NOT CONTROLLED WITH YOUR NAUSEA MEDICATION  *UNUSUAL SHORTNESS OF BREATH  *UNUSUAL BRUISING OR BLEEDING  TENDERNESS IN MOUTH AND THROAT WITH OR WITHOUT PRESENCE OF ULCERS  *URINARY PROBLEMS  *BOWEL PROBLEMS  UNUSUAL RASH Items with * indicate a potential emergency and should be followed up as soon as possible. If you have an emergency after office hours please contact your primary care physician or go to the nearest emergency department.  Please call the clinic during office hours if you have any questions or concerns.   You may also contact the Patient Navigator at [phone] should you have any questions or need assistance in obtaining follow up care.      Resources For Cancer Patients and their Caregivers ? American Cancer Society: Can assist with transportation, wigs, general needs, runs Look Good Feel Better.        [phone] ? Cancer Care: Provides financial assistance, online support groups, medication/co-pay assistance.  1-800-813-HOPE [phone]) ? [place] Assists [place] cancer patients and their families through emotional , educational and financial support.   7127006584 ? [place] DSS Where to apply for food stamps, Medicaid and utility assistance. 612-101-6033 ? RCATS: Transportation to medical appointments. [phone] ? Social Security Administration: May apply for disability if have a Stage IV cancer. [phone] [phone] ? [employer], Disability and Transit Services: Assists with nutrition, care and transit needs. [phone]

## 2015-10-28 NOTE — Patient Instructions (Addendum)
[place] at [facility] Discharge Instructions  RECOMMENDATIONS MADE BY THE CONSULTANT AND ANY TEST RESULTS WILL BE SENT TO YOUR REFERRING PHYSICIAN.   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 to provide your oncology and hematology care.  To afford each patient quality time with our provider, please arrive at least 15 minutes before your scheduled appointment time.   Beginning January 23rd 2017 lab work for the [facility] will be done in the  Main lab at [facility] on 1st floor. If you have a lab appointment with the [place] please come in thru the  Main Entrance and check in at the main information desk  You need to re-schedule your appointment should you arrive 10 or more minutes late.  We strive to give you quality time with our providers, and arriving late affects you and other patients whose appointments are after yours.  Also, if you no show three or more times for appointments you may be dismissed from the clinic at the providers discretion.     Again, thank you for choosing [facility].  Our hope is that these requests will decrease the amount of time that you wait before being seen by our physicians.       _____________________________________________________________  Should you have questions after your visit to [facility], please contact our office at [phone] between the hours of 8:30 a.m. and 4:30 p.m.  Voicemails left after 4:30 p.m. will not be returned until the following business day.  For prescription refill requests, have your pharmacy contact our office.          Resources For Cancer Patients and their Caregivers ? American Cancer Society: Can assist with transportation, wigs, general needs, runs Look Good Feel Better.        4084965856 ? Cancer Care: Provides financial assistance, online support groups, medication/co-pay assistance.  1-800-813-HOPE 651-797-5503) ? [place] Assists [place] cancer patients and their families through emotional , educational and financial support.  ([phone] ? [place] DSS Where to apply for food stamps, Medicaid and utility assistance. 424-349-9963 ? [employer]: Transportation to medical appointments. 726-626-7309 ? Social Security Administration: May apply for disability if have a Stage IV cancer. [phone] ? [employer], Disability and Transit Services: Assists with nutrition, care and transit needs. [phone]

## 2015-10-28 NOTE — Progress Notes (Signed)
Okay to tx today prior to Mg++ results per Dr. Whitney Muse.   1030:  Tolerated tx w/o adverse reaction.  VSS, in no distress.  Discharged ambulatory in c/o friend for transport home.

## 2015-10-28 NOTE — Progress Notes (Signed)
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 Tongue, biopsy, left base/ pharynx mass - INVASIVE MODERATELY DIFFERENTIATED SQUAMOUS CELL CARCINOMA. - SEE COMMENT. 2. Lymph nodes, radical neck dissection, Left - THREE OF SIXTEEN LYMPH NODES POSITIVE FOR METASTATIC SQUAMOUS CELL [facility]   09/18/2014 Surgery Multiple extraction of tooth numbers 2, 14, 15, 18, and 31.  4 Quadrants of alveoloplasty.  Gross debridement of remaining dentition. Mandibular left lingual torus reduction   10/08/2014 - 11/19/2014 Chemotherapy Cisplatin every 21 days   10/09/2014 - 11/30/2014 Radiation Therapy Dr. Isidore Larson with twice daily dosing at the end of treatment at patient's request due to trip to [facility] against medical advice.   02/22/2015 PET scan Prior oropharyngeal mass has essentially resolved.  No findings specific for metastatic disease.   02/22/2015 Remission    09/15/2015 PET scan 2.2 x 4.2 cm soft tissue mass along anterior aspect of heart, 1.7 x 4.7 cm mass along L heart border. thoracic LAD   10/07/2015 Procedure Video bronchoscopy, endobronchial ultrasound, L anterior mediastinotomy with Dr. Modesto Larson   10/08/2015 Pathology Results Squamous Cell Carcinoma     CURRENT THERAPY:Observation  INTERVAL HISTORY: Shelby Larson 50 y.o. female returns for followup of Stage IVA, HPV-, Moderately differentiated squamous cell  carcinoma of the base of the tongue, S/P left neck dissection with sparing of 11th cranial nerve, sternocleidomastoid muscle, internal jugular vein biopsy , tracheotomy on 07/31/2014. She has completed concurrent XRT/cisplatin. PET imaging performed in March was highly suspicious for recurrence. She has undergone biopsy with Dr. Roxan Larson which has unfortunately revealed recurrent disease within the chest.  Shelby Larson was here today to receive Cycle #1 day 8 of Cetuximab. She was in a treatment chair and was with her friend.  She said that she is not eating a lot. She will begin to make herself smoothies to eat.   She said that she got sick yesterday and today. This sometimes happens when she takes her pills and that is why she got sick this morning. Yesterday she got sick because she drank too much water too quickly. She says that she vomits once a week like "clockwork" but this started before treatment. She notes no nausea or vomiting after chemotherapy last week.   Her throat has been hurting since last night.   She says she smokes less than 5 cigarettes a day now. She didn't smoke yesterday or today because her throat has been sore.   She has been using 2 x 100 mcg Fentanyl patches at a time and keeps them on for 48 hours. She said that she knows she wasn't supposed to do this but it makes her feel a lot better. She says that this works better for the first two days and then it feels like it wears off. She can hardly wait for the day to be over so she can put on 2 new  patches.   She also said that her "Neulasta machine" didn't beep when it was supposed to. Her mother finally told her that the light was green an hour after it was supposed to beep. She said it was empty and the line was below where it was supposed to be.   She says overall treatment wasn't bad. She wishes to continue. She has multiple questions including if at some point she can 'fly' She would like to take a trip to the  [place].    Malignant neoplasm of base of tongue ([place])   08/20/2014 Surgery Diagnosis 1. Tongue, biopsy, left base/ pharynx mass - INVASIVE MODERATELY DIFFERENTIATED SQUAMOUS CELL CARCINOMA. - SEE COMMENT. 2. Lymph nodes, radical neck dissection, Left - THREE OF SIXTEEN LYMPH NODES POSITIVE FOR METASTATIC SQUAMOUS CELL [facility]   09/18/2014 Surgery Multiple extraction of tooth numbers 2, 14, 15, 18, and 31.  4 Quadrants of alveoloplasty.  Gross debridement of remaining dentition. Mandibular left lingual torus reduction   10/08/2014 - 11/19/2014 Chemotherapy Cisplatin every 21 days   10/09/2014 - 11/30/2014 Radiation Therapy Dr. Isidore Larson with twice daily dosing at the end of treatment at patient's request due to trip to [facility] against medical advice.   02/22/2015 PET scan Prior oropharyngeal mass has essentially resolved.  No findings specific for metastatic disease.   02/22/2015 Remission    09/15/2015 PET scan 2.2 x 4.2 cm soft tissue mass along anterior aspect of heart, 1.7 x 4.7 cm mass along L heart border. thoracic LAD   10/07/2015 Procedure Video bronchoscopy, endobronchial ultrasound, L anterior mediastinotomy with Dr. Modesto Larson   10/08/2015 Pathology Results Squamous Cell Carcinoma     Past Medical History  Diagnosis Date  . Heart murmur     as a child  . Chronic bronchitis ([place])   . Anxiety     panic attacks  . Depression   . Headache     onset a few months ago  . Neuropathy ([place])     had it in both hands  . Arthritis   . Fibromyalgia   . Anemia   . Rosacea   . Hypercholesterolemia   . Asthma   . COPD (chronic obstructive pulmonary disease) ([place])   . Seizures ([place])     takes Gabapentin  (last one in 2010); head trauma caused seizures.  . Malignant neoplasm of base of tongue ([place]) 2016    Base of tongue with neck metastases  . Wears glasses   . Pneumonia   . Hypothyroidism   . 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     is allergic to bee venom; penicillins; and latex.  Ms. Issa had no medications administered during this visit.  Past Surgical History  Procedure Laterality Date  . Wisdom tooth extraction    . Tonsillectomy Left 08/20/2014    Procedure: TONSILLECTOMY;  Surgeon: Ruby Cola, MD;  Location: [facility] OR;  Service: ENT;  Laterality: Left;  . Radical neck dissection Left 08/20/2014    Procedure: RADICAL LEFT NECK DISSECTION ;  Surgeon: Ruby Cola, MD;  Location: [place];  Service: ENT;  Laterality: Left;  . Tracheostomy tube placement N/A 08/20/2014    Procedure: TRACHEOSTOMY - AWAKE;  Surgeon: Ruby Cola, MD;  Location: [place];  Service: ENT;  Laterality: N/A;  . Gastrostomy tube placement    .  Multiple extractions with alveoloplasty N/A 09/17/2014    Procedure: Extraction of tooth #'s [phone] with alveoloplasty, mandibular left lingual torus reduction, and gross debridement of remaining teeth.;  Surgeon: Lenn Cal, DDS;  Location: [facility];  Service: Oral Surgery;  Laterality: N/A;  . Portacath placement    . Colonoscopy with propofol N/A 04/15/2015    RMR: colonic polyps removed as described above.,  . Polypectomy N/A 04/15/2015    Procedure: POLYPECTOMY;  Surgeon: Daneil Dolin, MD;  Location: [facility] ORS;  Service: Endoscopy;  Laterality: N/A;  . Esophagogastroduodenoscopy (egd) with propofol N/A 07/19/2015    RMR: poorly postitioned PEG tube. Status post removal and replacement of a small bowel video capsule  . Givens capsule study N/A 07/19/2015    Procedure: GIVENS CAPSULE STUDY;  Surgeon: Daneil Dolin, MD;  Location: [facility];  Service: Endoscopy;  Laterality: N/A;  . Tonsillectomy    . Video bronchoscopy with endobronchial ultrasound N/A 10/07/2015    Procedure: VIDEO BRONCHOSCOPY WITH  ENDOBRONCHIAL ULTRASOUND;  Surgeon: Melrose Nakayama, MD;  Location: [place];  Service: Thoracic;  Laterality: N/A;  . Mediastinotomy Left 10/07/2015    Procedure: LEFT ANTERIOR MEDIASTINOTOMY;  Surgeon: Melrose Nakayama, MD;  Location: [place];  Service: Thoracic;  Laterality: Left;    Positive for joint pain and weakness.     Achy joints. Positive for loss of appetite and dry mouth    Taste change. Rare difficulty swallowing. Positive for sore throat.  Throat has been sore since last night.  Positive for vomiting and nausea Threw up this morning and yesterday. Today was because of her medicine; Yesterday was because she drank too much water.    14 point review of systems was performed and is negative except as detailed under history of present illness and above   PHYSICAL EXAMINATION  ECOG PERFORMANCE STATUS: 1 - Symptomatic but completely ambulatory  Filed Vitals:   10/28/15 0800  BP: 111/70  Pulse: 81  Temp: 97.9 F (36.6 C)  Resp: 18    GENERAL:alert, no distress, well developed, comfortable, thin,  SKIN: skin color, texture, turgor are normal, no rashes or significant lesions. Bilateral neck with chronic XRT changes HEAD: Normocephalic, No masses, lesions, tenderness or abnormalities MOUTH: Dry mucus membranes EYES: normal, PERRLA, EOMI, Conjunctiva are pink and non-injected EARS: External ears normal OROPHARYNX:lips, buccal mucosa, and tongue normal and mucous membranes are moist minimal erythema  NECK: supple, trachea midline, tracheostomy site well healed. LYMPH:  no palpable lymphadenopathy BREAST:not examined LUNGS: Overall clear HEART: regular rhythm ABDOMEN:non-tender PEG site C/D/I BACK: Back symmetric, no curvature. EXTREMITIES:less then 2 second capillary refill, no skin discoloration  NEURO: alert & oriented x 3 with fluent speech, gait normal      LABORATORY DATA: I have reviewed his laboratory data listed below  Results for Shelby, Larson (MRN [id number]) as of 10/28/2015 09:20  Ref. Range 10/28/2015 08:30  Sodium Latest Ref Range: 135-145 mmol/L 129 (L)  Potassium Latest Ref Range: 3.5-5.1 mmol/L 3.7  Chloride Latest Ref Range: 101-111 mmol/L 90 (L)  CO2 Latest Ref Range: 22-32 mmol/L 28  BUN Latest Ref Range: 6-20 mg/dL 35 (H)  Creatinine Latest Ref Range: 0.44-1.00 mg/dL 1.34 (H)  Calcium Latest Ref Range: 8.9-10.3 mg/dL 8.8 (L)  EGFR (Non-African Amer.) Latest Ref Range: >60 mL/min 46 (L)  EGFR (African American) Latest Ref Range: >60 mL/min 53 (L)  Glucose Latest Ref Range: 65-99 mg/dL 106 (H)  Anion gap Latest Ref Range: 5-15  11  Magnesium Latest Ref Range: 1.7-2.4 mg/dL 1.8  Alkaline Phosphatase Latest Ref Range: 38-126 U/L 117  Albumin Latest Ref Range: 3.5-5.0 g/dL 3.4 (L)  AST Latest Ref Range: 15-41 U/L 23  ALT Latest Ref Range: 14-54 U/L 10 (L)  Total Protein Latest Ref Range: 6.5-8.1 g/dL 7.4  Total Bilirubin Latest Ref Range: 0.3-1.2 mg/dL 0.7  WBC Latest Ref Range: 4.0-10.5 K/uL 19.5 (H)  RBC Latest Ref Range: 3.87-5.11 MIL/uL 3.13 (L)  Hemoglobin Latest Ref Range: 12.0-15.0 g/dL 9.7 (L)  HCT Latest Ref Range: 36.0-46.0 % 28.8 (L)  MCV Latest Ref Range: 78.0-100.0 fL 92.0  MCH Latest Ref Range: 26.0-34.0 pg 31.0  MCHC Latest Ref Range: 30.0-36.0 g/dL 33.7  RDW Latest Ref Range: 11.5-15.5 % 15.0  Platelets Latest Ref Range: 150-400 K/uL 311  Neutrophils Latest Units: % 96  Lymphocytes Latest Units: % 2  Monocytes Relative Latest Units: % 2  Eosinophil Latest Units: % 0  Basophil Latest Units: % 0  NEUT# Latest Ref Range: 1.7-7.7 K/uL 18.7 (H)  Lymphocyte # Latest Ref Range: 0.7-4.0 K/uL 0.4 (L)  Monocyte # Latest Ref Range: 0.1-1.0 K/uL 0.3  Eosinophils Absolute Latest Ref Range: 0.0-0.7 K/uL 0.0  Basophils Absolute Latest Ref Range: 0.0-0.1 K/uL 0.0   RADIOGRAPHIC STUDIES: I have personally reviewed the radiological images as listed and agreed with the findings in the report.   Study  Result     CLINICAL DATA: Mediastinal mass.  EXAM: PORTABLE CHEST 1 VIEW  COMPARISON: October 05, 2015.  FINDINGS: Stable cardiomediastinal silhouette. Right internal jugular Port-A-Cath is unchanged in position. No pneumothorax is noted. Right lung is clear. Stable nodule seen in left midlung peripherally. Increased left basilar opacity is no concerning for atelectasis or infiltrate. No significant pleural effusion is noted. Bony thorax is unremarkable.  IMPRESSION: Stable left midlung nodule compared to prior exam. Increased left basilar opacity is noted concerning for atelectasis or infiltrate.   Electronically Signed  By: Marijo Conception, M.D.  On: 10/07/2015 10:42    Study Result     CLINICAL DATA: Mediastinal mass. History of tongue cancer.  EXAM: CHEST 2 VIEW  COMPARISON: Chest x-ray 09/08/2015. PET 09/15/2015  FINDINGS: Pleural-based nodularity in the left lung base again noted. There is probable mild progression particularly in the left mid lateral chest and in the left lateral base. There is also a nodule along the left cardiac border. Anterior mediastinal mass seen on the prior PET is difficult to see on the chest x-ray but is suggested on the lateral view with increased density in the anterior mediastinum.  Right lung is clear. Negative for pneumonia or heart failure. No effusion. Port-A-Cath tip in the lower SVC  IMPRESSION: Pleural-based soft tissue nodules left mid and lower lung appears slightly more prominent compared with the prior chest x-ray of 09/08/2015. Suspect metastatic disease. Anterior mediastinal mass better seen on prior cross-sectional imaging.   Electronically Signed  By: Franchot Gallo M.D.  On: 10/05/2015 10:24    Study Result     CLINICAL DATA: Subsequent treatment strategy for base of tongue cancer.  EXAM: NUCLEAR MEDICINE PET SKULL BASE TO THIGH  TECHNIQUE: 5.36 mCi F-18 FDG was  injected intravenously. Full-ring PET imaging was performed from the skull base to thigh after the radiotracer. CT data was obtained and used for attenuation correction and anatomic localization.  FASTING BLOOD GLUCOSE: Value: 90 mg/dl  COMPARISON: CT chest dated 06/08/2015. PET-CT dated 02/22/2015.  FINDINGS: NECK  No hypermetabolic lymph nodes in the neck.  CHEST  2.2 x 4.2 cm soft tissue mass along the anterior aspect of the heart/medial aspect of the anterior left upper lobe (series 4/image 65), new from recent prior CT, max SUV 24.0.  Additional 4.7 x 1.7 cm soft tissue lesion along the left heart border/medial aspect of the left upper lobe (series 4/image 79), new from recent prior CT, max SUV 14.9.  These are both worrisome for pleural-based or less likely pericardial metastases.  Additional residual soft tissue nodularity along the left fissure measuring 1.4 x 1.7 cm (series 4/ image 76), max SUV 11.3. Associated cavitary masslike opacity with surrounding ground-glass opacity in the left upper and lower lobes has essentially resolved, suggesting an infectious etiology. As such, it is unclear whether this is related to residual infection or possibly additional pleural-based tumor.  Additional scattered thin-walled cavitary lesions with scarring in the lingula and left lower lobe, related to prior infection. No pleural effusion or pneumothorax.  The heart is normal in size. No pericardial effusion. Right chest port terminates at the cavoatrial junction.  Hypermetabolic thoracic lymphadenopathy, including:  --6 mm short axis prevascular node (series 4/ image 50), max SUV 6.0  --7 mm short axis high right paratracheal node (series 4/ image 48), max SUV 4.8  --12 mm short axis subcarinal node (series 4/image 57), max SUV 9.9  --Hypermetabolic focus in the left hilum, max SUV 8.4  --Mild hypermetabolism in the right hilum, max SUV  4.7  ABDOMEN/PELVIS  No abnormal hypermetabolic activity within the liver, pancreas, adrenal glands, or spleen.  Gastrostomy in satisfactory position. Atherosclerotic calcifications of the abdominal aorta and branch vessels. 3 mm nonobstructing right upper pole renal calculus.  No hypermetabolic lymph nodes in the abdomen or pelvis.  SKELETON  No focal hypermetabolic activity to suggest skeletal metastasis.  IMPRESSION: 2.2 x 4.2 cm soft tissue mass along the anterior aspect of the heart and 1.7 x 4.7 cm soft tissue mass along the left heart border, suspicious for pleural-based or less likely pericardial metastases, new.  Prior left upper and lower lobe patchy opacity has essentially resolved, likely infectious. Residual 1.4 x 1.7 cm pleural-based nodularity along the left fissure may reflect additional tumor versus residual infection.  Hypermetabolic thoracic lymphadenopathy, suspicious for nodal metastases, as above.   Electronically Signed  By: Julian Hy M.D.  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 We discussed again the importance of nutritional intake and her weight. She notes that she will work on this. This unfortunately has been an ongoing issue for her. She was also encouraged to make sure she has adequate free water intake. She is cleared to Erbitux today. Magnesium will continue to be closely monitored.   We discussed her narcotics use and I explained that patients do not become tolerant to sedation or constipation. She is thin and there is some suggestion that  this makes absorption of the patch more difficult. She does not feel that she can swallow long acting pain medication, she has difficulty with pills. I will change her hydrocodone from tablet to elixir. I have encouraged her to not take her medication on an empty stomach. I have increased her patch  to 2 x 100 mcg q 72 hours. She has been using them in this manner. I advised her that this is the maximum on these.   We will continue to discuss end of life planning and goal setting.  She will return next Thursday for Cycle #1 day 15 of Cetuximab treatment.  All questions were answered. The patient knows to call the clinic with any problems, questions or concerns. We can certainly see the patient much sooner if necessary.   This document serves as a record of services personally performed by Ancil Linsey, MD. It was created on her behalf by Kandace Blitz, a trained medical scribe. The creation of this record is based on the scribe's personal observations and the provider's statements to them. This document has been checked and approved by the attending provider.  I have reviewed the above documentation for accuracy and completeness, and I agree with the above.  Kelby Fam. Quana Chamberlain MD

## 2015-10-29 ENCOUNTER — Inpatient Hospital Stay (HOSPITAL_COMMUNITY): Payer: Self-pay

## 2015-11-01 ENCOUNTER — Other Ambulatory Visit (HOSPITAL_COMMUNITY): Payer: Self-pay | Admitting: *Deleted

## 2015-11-01 DIAGNOSIS — C140 Malignant neoplasm of pharynx, unspecified: Secondary | ICD-10-CM

## 2015-11-01 MED ORDER — NYSTATIN 100000 UNIT/ML MT SUSP: 5.0000 mL | Freq: Four times a day (QID) | OROMUCOSAL | Status: DC

## 2015-11-02 ENCOUNTER — Other Ambulatory Visit (HOSPITAL_COMMUNITY): Payer: Self-pay

## 2015-11-02 ENCOUNTER — Ambulatory Visit (HOSPITAL_COMMUNITY): Payer: Self-pay

## 2015-11-03 NOTE — Progress Notes (Signed)
Shelby Labrum, MD [place] [address]  Malignant neoplasm of base of tongue [facility])  CURRENT THERAPY:  INTERVAL HISTORY: Shelby Larson 50 y.o. 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 She has completed concurrent XRT/cisplatin. PET imaging performed in March was highly suspicious for recurrence. She has undergone biopsy with Dr. Roxan Hockey which has unfortunately revealed recurrent disease within the chest.    Malignant neoplasm of base of tongue ([place])   08/20/2014 Surgery Diagnosis 1. Tongue, biopsy, left base/ pharynx mass - INVASIVE MODERATELY DIFFERENTIATED SQUAMOUS CELL CARCINOMA. - SEE COMMENT. 2. Lymph nodes, radical neck dissection, Left - THREE OF SIXTEEN LYMPH NODES POSITIVE FOR METASTATIC SQUAMOUS CELL [facility]   09/18/2014 Surgery Multiple extraction of tooth numbers 2, 14, 15, 18, and 31.  4 Quadrants of alveoloplasty.  Gross debridement of remaining dentition. Mandibular left lingual torus reduction   10/08/2014 - 11/19/2014 Chemotherapy Cisplatin every 21 days   10/09/2014 - 11/30/2014 Radiation Therapy [name] with twice daily dosing at the end of treatment at patient's request due to trip to [facility] against medical advice.   02/22/2015 PET scan Prior oropharyngeal mass has essentially resolved.  No findings specific for metastatic disease.   02/22/2015 Remission    09/15/2015 PET scan 2.2 x 4.2 cm soft tissue mass along anterior aspect of heart, 1.7 x 4.7 cm mass along L heart border. thoracic LAD   10/07/2015 Procedure Video bronchoscopy, endobronchial ultrasound, L anterior mediastinotomy with Dr. Modesto Charon   10/08/2015 Pathology Results Squamous Cell Carcinoma   10/22/2015 -  Chemotherapy Carboplatin Day 1 and 5 FU days  1-5 every 21 days   10/22/2015 -  Antibody Plan Cetuximab q 7 days   11/04/2015 Treatment Plan Change 5FU dose reduced by 20% for cycle #2.   I personally reviewed and went over laboratory results with the patient.  The results are noted within this dictation.  Labs will be updated today.  She notes that her lips are sore.  Her mouth is burning.  As a result, she does not want to take pain medication (liquid).  She is witness sipping water during discussion today.  She reports that swallowing is painful.  Her weight is stable at 101 lbs.  She also reports "diarrhea."  She reports 12 visits to the bathroom per day, but reports some instances of just flatulence.  She also some BMs are small in natures.  She denies any extraordinary malodorous BMs.  She was frustrated today and wants to consider stopping treatment.  We had a long discussion about this and she is educated that she has a great performance status at this time and therefore, our window of opportunity for treatment is now.  She si educated that our goal is to drop Carboplatin/5FU in the future and just administer Cetuximab in a maintenance fashion.  She is educated on a dose reduction in 5 FU as her symptoms seem to be 5FU related.  Review of Systems  Constitutional: Positive for malaise/fatigue. Negative for fever, chills and weight loss.  HENT: Negative for congestion, nosebleeds and sore throat.   Eyes: Negative for double vision.  Respiratory: Negative for cough, hemoptysis, sputum production and shortness of breath.   Cardiovascular: Negative for chest pain and leg swelling.  Gastrointestinal: Positive for  diarrhea. Negative for heartburn, nausea, vomiting, constipation, blood in stool and melena.  Genitourinary: Negative for dysuria, urgency, frequency and hematuria.  Musculoskeletal: Negative for myalgias and falls.  Skin: Negative for itching and rash.  Neurological: Positive for weakness. Negative for dizziness, loss of  consciousness and headaches.  Endo/Heme/Allergies: Does not bruise/bleed easily.  Psychiatric/Behavioral: Negative.      Past Medical History  Diagnosis Date  . Heart murmur     as a child  . Chronic bronchitis ([place])   . Anxiety     panic attacks  . Depression   . Headache     onset a few months ago  . Neuropathy ([place])     had it in both hands  . Arthritis   . Fibromyalgia   . Anemia   . Rosacea   . Hypercholesterolemia   . Asthma   . COPD (chronic obstructive pulmonary disease) ([place])   . Seizures ([place])     takes Gabapentin  (last one in 2010); head trauma caused seizures.  . Malignant neoplasm of base of tongue ([place]) 2016    Base of tongue with neck metastases  . Wears glasses   . Pneumonia   . Hypothyroidism   . GERD (gastroesophageal reflux disease)     Past Surgical History  Procedure Laterality Date  . Wisdom tooth extraction    . Tonsillectomy Left 08/20/2014    Procedure: TONSILLECTOMY;  Surgeon: Ruby Cola, MD;  [facility];  Service: ENT;  Laterality: Left;  . Radical neck dissection Left 08/20/2014    Procedure: RADICAL LEFT NECK DISSECTION ;  Surgeon: Ruby Cola, MD;  Location: [place];  Service: ENT;  Laterality: Left;  . Tracheostomy tube placement N/A 08/20/2014    Procedure: TRACHEOSTOMY - AWAKE;  Surgeon: Ruby Cola, MD;  Location: [place];  Service: ENT;  Laterality: N/A;  . Gastrostomy tube placement    . Multiple extractions with alveoloplasty N/A 09/17/2014    Procedure: Extraction of tooth #'s [phone] with alveoloplasty, mandibular left lingual torus reduction, and gross debridement of remaining teeth.;  Surgeon: Lenn Cal, DDS;  Location: [place];  Service: Oral Surgery;  Laterality: N/A;  . Portacath placement    . Colonoscopy with propofol N/A 04/15/2015    RMR: colonic polyps removed as described above.,  . Polypectomy N/A 04/15/2015    Procedure: POLYPECTOMY;  Surgeon: Daneil Dolin, MD;  Location: [facility] ORS;  Service:  Endoscopy;  Laterality: N/A;  . Esophagogastroduodenoscopy (egd) with propofol N/A 07/19/2015    RMR: poorly postitioned PEG tube. Status post removal and replacement of a small bowel video capsule  . Givens capsule study N/A 07/19/2015    Procedure: GIVENS CAPSULE STUDY;  Surgeon: Daneil Dolin, MD;  Location[facility] ENDO SU[facility];  Service: Endoscopy;  Laterality: N/A;  . Tonsillectomy    . Video bronchoscopy with endobronchial ultrasound N/A 10/07/2015    Procedure: VIDEO BRONCHOSCOPY WITH ENDOBRONCHIAL ULTRASOUND;  Surgeon: Melrose Nakayama, MD;  Location: [place];  Service: Thoracic;  Laterality: N/A;  . Mediastinotomy Left 10/07/2015    Procedure: LEFT ANTERIOR MEDIASTINOTOMY;  Surgeon: Melrose Nakayama, MD;  Location: [place];  Service: Thoracic;  Laterality: Left;    Family History  Problem Relation Age of Onset  . Neuropathy Mother   . Hypertension Mother   . Alcoholism Father   . Alcoholism Sister     Social History   Social History  . Marital Status: Single    Spouse Name: N/A  . Number  of Children: N/A  . Years of Education: N/A   Social History Main Topics  . Smoking status: Current Every Day Smoker -- 0.50 packs/day for 20 years    Types: Cigarettes  . Smokeless tobacco: Never Used     Comment: Per pt she restarted 2016 as of 08-17-15  . Alcohol Use: 0.0 oz/week    0 Standard drinks or equivalent per week     Comment: rare  . Drug Use: No     Comment: per pt no 10/05/15  . Sexual Activity: Not on file   Other Topics Concern  . Not on file   Social History Narrative     PHYSICAL EXAMINATION  ECOG PERFORMANCE STATUS: 1 - Symptomatic but completely ambulatory  Filed Vitals:   11/04/15 0850  BP: 112/82  Pulse: 76  Temp: 98.5 F (36.9 C)  Resp: 18    GENERAL:alert, no distress, anxious, cachectic, comfortable, cooperative, upset/frustrated, and accompanied by friend SKIN: skin color, texture, turgor are normal, no rashes or significant lesions HEAD:  Normocephalic, No masses, lesions, tenderness or abnormalities EYES: normal, EOMI, Conjunctiva are pink and non-injected EARS: External ears normal OROPHARYNX:lips, buccal mucosa, and tongue normal and mucous membranes are moist  NECK: supple, tracheostomy LYMPH:  not examined BREAST:not examined LUNGS: clear to auscultation  HEART: regular rate & rhythm ABDOMEN:abdomen soft and normal bowel sounds BACK: Back symmetric, no curvature. EXTREMITIES:less then 2 second capillary refill, no joint deformities, effusion, or inflammation, no skin discoloration, no cyanosis, positive findings:  Clubbing of fingernails, left #3 DIP of finger with erythema  NEURO: alert & oriented x 3 with fluent speech, no focal motor/sensory deficits, gait normal   LABORATORY DATA: CBC    Component Value Date/Time   WBC 8.1 11/04/2015 0845   WBC 11.3* 09/02/2014 1007   RBC 2.99* 11/04/2015 0845   RBC 3.22* 03/02/2015 1448   RBC 3.76 09/02/2014 1007   HGB 9.1* 11/04/2015 0845   HGB 11.1* 09/02/2014 1007   HCT 27.8* 11/04/2015 0845   HCT 34.8 09/02/2014 1007   PLT 104* 11/04/2015 0845   PLT 396 09/02/2014 1007   MCV 93.0 11/04/2015 0845   MCV 92.6 09/02/2014 1007   MCH 30.4 11/04/2015 0845   MCH 29.5 09/02/2014 1007   MCHC 32.7 11/04/2015 0845   MCHC 31.9 09/02/2014 1007   RDW 15.4 11/04/2015 0845   RDW 15.4* 09/02/2014 1007   LYMPHSABS 0.4* 11/04/2015 0845   LYMPHSABS 1.5 09/02/2014 1007   MONOABS 0.7 11/04/2015 0845   MONOABS 1.1* 09/02/2014 1007   EOSABS 0.0 11/04/2015 0845   EOSABS 0.2 09/02/2014 1007   BASOSABS 0.0 11/04/2015 0845   BASOSABS 0.0 09/02/2014 1007      Chemistry      Component Value Date/Time   NA 132* 11/04/2015 0845   NA 137 09/02/2014 1007   K 3.5 11/04/2015 0845   K 4.5 09/02/2014 1007   CL 92* 11/04/2015 0845   CO2 30 11/04/2015 0845   CO2 27 09/02/2014 1007   BUN 30* 11/04/2015 0845   BUN 10.7 09/02/2014 1007   CREATININE 1.44* 11/04/2015 0845   CREATININE 0.8  09/02/2014 1007      Component Value Date/Time   CALCIUM 8.9 11/04/2015 0845   CALCIUM 9.2 10/19/2015 0855   CALCIUM 10.1 09/02/2014 1007   ALKPHOS 158* 11/04/2015 0845   AST 19 11/04/2015 0845   ALT 10* 11/04/2015 0845   BILITOT 0.4 11/04/2015 0845       PENDING LABS:   RADIOGRAPHIC  STUDIES:  Dg Chest 2 View  10/05/2015  CLINICAL DATA:  Mediastinal mass.  History of tongue cancer. EXAM: CHEST  2 VIEW COMPARISON:  Chest x-ray 09/08/2015.  PET 09/15/2015 FINDINGS: Pleural-based nodularity in the left lung base again noted. There is probable mild progression particularly in the left mid lateral chest and in the left lateral base. There is also a nodule along the left cardiac border. Anterior mediastinal mass seen on the prior PET is difficult to see on the chest x-ray but is suggested on the lateral view with increased density in the anterior mediastinum. Right lung is clear. Negative for pneumonia or heart failure. No effusion. Port-A-Cath tip in the lower SVC IMPRESSION: Pleural-based soft tissue nodules left mid and lower lung appears slightly more prominent compared with the prior chest x-ray of 09/08/2015. Suspect metastatic disease. Anterior mediastinal mass better seen on prior cross-sectional imaging. Electronically Signed   By: Franchot Gallo M.D.   On: 10/05/2015 10:24   Dg Chest Port 1 View  10/07/2015  CLINICAL DATA:  Mediastinal mass. EXAM: PORTABLE CHEST 1 VIEW COMPARISON:  October 05, 2015. FINDINGS: Stable cardiomediastinal silhouette. Right internal jugular Port-A-Cath is unchanged in position. No pneumothorax is noted. Right lung is clear. Stable nodule seen in left midlung peripherally. Increased left basilar opacity is no concerning for atelectasis or infiltrate. No significant pleural effusion is noted. Bony thorax is unremarkable. IMPRESSION: Stable left midlung nodule compared to prior exam. Increased left basilar opacity is noted concerning for atelectasis or infiltrate.  Electronically Signed   By: Marijo Conception, M.D.   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 She has completed concurrent XRT/cisplatin. PET imaging performed in March was highly suspicious for recurrence. She has undergone biopsy with Dr. Roxan Hockey which has unfortunately revealed recurrent disease within the chest, therefore, NOW WITH STAGE IVC disease.  Oncology history updated.  Labs today as planned: CBC diff, CMET, Magnesium.  She is due for Cetuximab single-agent today.  5FU is dose reduced by 20% for subsequent cycles secondary to side effects including mouth burning, diarrhea.  She is advised to try Imodium for her diarrhea.  She admits that she visits the restroom 12 times per day, but some instances results in passage of gas or very small amounts of liquid stool.  She is to continue with supportive care with mouth burning.  Next week, she will be due for cycle #2 of therapy with 5FU reduced by 20%.    ORDERS PLACED FOR THIS ENCOUNTER: No orders of the defined types were placed in this encounter.    MEDICATIONS PRESCRIBED THIS ENCOUNTER: No orders of the defined types were placed in this encounter.    THERAPY PLAN:  Continue with treatment as planned and we will perform restaging tests following cycle #2 or #3 depending on clinical course.  All questions were answered. The patient knows to call the clinic with any problems, questions or concerns. We can certainly see the patient much sooner if necessary.  Patient and plan discussed with Dr. Ancil Linsey and she is in agreement with the aforementioned.   This note is electronically signed by: Doy Mince 11/04/2015 9:52 AM

## 2015-11-03 NOTE — Assessment & Plan Note (Addendum)
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 She has completed concurrent XRT/cisplatin. PET imaging performed in March was highly suspicious for recurrence. She has undergone biopsy with Dr. Roxan Hockey which has unfortunately revealed recurrent disease within the chest, therefore, NOW WITH STAGE IVC disease.  Oncology history updated.  Labs today as planned: CBC diff, CMET, Magnesium.  She is due for Cetuximab single-agent today.  5FU is dose reduced by 20% for subsequent cycles secondary to side effects including mouth burning, diarrhea.  She is advised to try Imodium for her diarrhea.  She admits that she visits the restroom 12 times per day, but some instances results in passage of gas or very small amounts of liquid stool.  She is to continue with supportive care with mouth burning.  Next week, she will be due for cycle #2 of therapy with 5FU reduced by 20%.

## 2015-11-04 ENCOUNTER — Encounter (HOSPITAL_BASED_OUTPATIENT_CLINIC_OR_DEPARTMENT_OTHER): Payer: Medicaid Other | Admitting: Oncology

## 2015-11-04 ENCOUNTER — Encounter (HOSPITAL_BASED_OUTPATIENT_CLINIC_OR_DEPARTMENT_OTHER): Payer: Medicaid Other

## 2015-11-04 VITALS — BP 108/79 | HR 80 | Temp 98.2°F | Resp 18

## 2015-11-04 VITALS — BP 112/82 | HR 76 | Temp 98.5°F | Resp 18 | Wt 101.2 lb

## 2015-11-04 DIAGNOSIS — C01 Malignant neoplasm of base of tongue: Secondary | ICD-10-CM

## 2015-11-04 DIAGNOSIS — N189 Chronic kidney disease, unspecified: Secondary | ICD-10-CM | POA: Diagnosis not present

## 2015-11-04 DIAGNOSIS — C140 Malignant neoplasm of pharynx, unspecified: Secondary | ICD-10-CM

## 2015-11-04 DIAGNOSIS — C7800 Secondary malignant neoplasm of unspecified lung: Secondary | ICD-10-CM

## 2015-11-04 DIAGNOSIS — D631 Anemia in chronic kidney disease: Secondary | ICD-10-CM | POA: Diagnosis not present

## 2015-11-04 DIAGNOSIS — Z5112 Encounter for antineoplastic immunotherapy: Secondary | ICD-10-CM

## 2015-11-04 LAB — COMPREHENSIVE METABOLIC PANEL
ALT: 10 U/L — ABNORMAL LOW (ref 14–54)
AST: 19 U/L (ref 15–41)
Albumin: 3.5 g/dL (ref 3.5–5.0)
Alkaline Phosphatase: 158 U/L — ABNORMAL HIGH (ref 38–126)
Anion gap: 10 (ref 5–15)
BILIRUBIN TOTAL: 0.4 mg/dL (ref 0.3–1.2)
BUN: 30 mg/dL — AB (ref 6–20)
CO2: 30 mmol/L (ref 22–32)
CREATININE: 1.44 mg/dL — AB (ref 0.44–1.00)
Calcium: 8.9 mg/dL (ref 8.9–10.3)
Chloride: 92 mmol/L — ABNORMAL LOW (ref 101–111)
GFR calc Af Amer: 49 mL/min — ABNORMAL LOW (ref >60)
GFR, EST NON AFRICAN AMERICAN: 42 mL/min — AB (ref >60)
Glucose, Bld: 109 mg/dL — ABNORMAL HIGH (ref 65–99)
POTASSIUM: 3.5 mmol/L (ref 3.5–5.1)
Sodium: 132 mmol/L — ABNORMAL LOW (ref 135–145)
TOTAL PROTEIN: 7.4 g/dL (ref 6.5–8.1)

## 2015-11-04 LAB — CBC WITH DIFFERENTIAL/PLATELET
BASOS ABS: 0 10*3/uL (ref 0.0–0.1)
Basophils Relative: 0 %
EOS PCT: 0 %
Eosinophils Absolute: 0 10*3/uL (ref 0.0–0.7)
HEMATOCRIT: 27.8 % — AB (ref 36.0–46.0)
Hemoglobin: 9.1 g/dL — ABNORMAL LOW (ref 12.0–15.0)
LYMPHS ABS: 0.4 10*3/uL — AB (ref 0.7–4.0)
LYMPHS PCT: 5 %
MCH: 30.4 pg (ref 26.0–34.0)
MCHC: 32.7 g/dL (ref 30.0–36.0)
MCV: 93 fL (ref 78.0–100.0)
MONO ABS: 0.7 10*3/uL (ref 0.1–1.0)
Monocytes Relative: 9 %
NEUTROS ABS: 6.9 10*3/uL (ref 1.7–7.7)
Neutrophils Relative %: 85 %
Platelets: 104 10*3/uL — ABNORMAL LOW (ref 150–400)
RBC: 2.99 MIL/uL — ABNORMAL LOW (ref 3.87–5.11)
RDW: 15.4 % (ref 11.5–15.5)
WBC: 8.1 10*3/uL (ref 4.0–10.5)

## 2015-11-04 LAB — MAGNESIUM: MAGNESIUM: 1.8 mg/dL (ref 1.7–2.4)

## 2015-11-04 MED ORDER — DIPHENHYDRAMINE HCL 50 MG/ML IJ SOLN
50.0000 mg | Freq: Once | INTRAMUSCULAR | Status: AC
  Administered 2015-11-04: 50 mg via INTRAVENOUS
  Filled 2015-11-04: qty 1

## 2015-11-04 MED ORDER — PALONOSETRON HCL INJECTION 0.25 MG/5ML
0.2500 mg | Freq: Once | INTRAVENOUS | Status: DC
  Filled 2015-11-04: qty 5

## 2015-11-04 MED ORDER — SODIUM CHLORIDE 0.9% FLUSH
10.0000 mL | INTRAVENOUS | Status: DC | PRN
Start: 2015-11-04 — End: 2015-11-04

## 2015-11-04 MED ORDER — DARBEPOETIN ALFA 60 MCG/0.3ML IJ SOSY
60.0000 ug | PREFILLED_SYRINGE | Freq: Once | INTRAMUSCULAR | Status: AC
  Administered 2015-11-04: 60 ug via SUBCUTANEOUS
  Filled 2015-11-04: qty 0.3

## 2015-11-04 MED ORDER — SODIUM CHLORIDE 0.9% FLUSH: 10.0000 mL | INTRAVENOUS | Status: DC | PRN

## 2015-11-04 MED ORDER — SODIUM CHLORIDE 0.9 % IV SOLN
Freq: Once | INTRAVENOUS | Status: AC
  Administered 2015-11-04: 10:00:00 via INTRAVENOUS

## 2015-11-04 MED ORDER — SODIUM CHLORIDE 0.9 % IV SOLN: Freq: Once | INTRAVENOUS | Status: AC

## 2015-11-04 MED ORDER — HEPARIN SOD (PORK) LOCK FLUSH 100 UNIT/ML IV SOLN
500.0000 [IU] | Freq: Once | INTRAVENOUS | Status: AC | PRN
  Administered 2015-11-04: 500 [IU]

## 2015-11-04 MED ORDER — HEPARIN SOD (PORK) LOCK FLUSH 100 UNIT/ML IV SOLN
500.0000 [IU] | Freq: Once | INTRAVENOUS | Status: DC | PRN
  Filled 2015-11-04: qty 5

## 2015-11-04 MED ORDER — CETUXIMAB CHEMO IV INJECTION 200 MG/100ML
400.0000 mg/m2 | Freq: Once | INTRAVENOUS | Status: AC
  Administered 2015-11-04: 600 mg via INTRAVENOUS
  Filled 2015-11-04: qty 300

## 2015-11-04 MED ORDER — DEXAMETHASONE SODIUM PHOSPHATE 100 MG/10ML IJ SOLN
10.0000 mg | Freq: Once | INTRAMUSCULAR | Status: DC
Start: 2015-11-04 — End: 2015-11-04
  Filled 2015-11-04: qty 1

## 2015-11-04 NOTE — Progress Notes (Signed)
Shelby Larson Tolerated infusion well today Discharged ambulatory

## 2015-11-04 NOTE — Patient Instructions (Signed)
[place] at [facility]  Discharge Instructions:  Seen by Kirby Crigler, PA today and received treatment- Centuximab  5FU dose reduced by 20% next cycle.  Take imodium for loose stools/Flatulence  Continue [facility]'s mouthwash for mouth pain.  Return next week for cycle #2  Return 2 weeks for follow-up with Dr. Whitney Muse.   _______________________________________________________________  Thank you for choosing [place] at [facility] to provide your oncology and hematology care.  To afford each patient quality time with our providers, please arrive at least 15 minutes before your scheduled appointment.  You need to re-schedule your appointment if you arrive 10 or more minutes late.  We strive to give you quality time with our providers, and arriving late affects you and other patients whose appointments are after yours.  Also, if you no show three or more times for appointments you may be dismissed from the clinic.  Again, thank you for choosing [place] at [place] hope is that these requests will allow you access to exceptional care and in a timely manner. _______________________________________________________________  If you have questions after your visit, please contact our office at [phone] between the hours of 8:30 a.m. and 5:00 p.m. Voicemails left after 4:30 p.m. will not be returned until the following business day. _______________________________________________________________  For prescription refill requests, have your pharmacy contact our office. _______________________________________________________________  Recommendations made by the consultant and any test results will be sent to your referring physician. _______________________________________________________________

## 2015-11-04 NOTE — Patient Instructions (Signed)
[facility] Discharge Instructions for Patients Receiving Chemotherapy   Beginning January 23rd 2017 lab work for the [facility] will be done in the  Main lab at [facility] on 1st floor. If you have a lab appointment with the [facility] please come in thru the  Main Entrance and check in at the main information desk   Today you received the following chemotherapy agents erbitux  aranesp today Follow up as scheduled  To help prevent nausea and vomiting after your treatment, we encourage you to take your nausea medication .   If you develop nausea and vomiting, or diarrhea that is not controlled by your medication, call the clinic.  The clinic phone number is [phone]. Office hours are Monday-Friday 8:30am-5:00pm.  BELOW ARE SYMPTOMS THAT SHOULD BE REPORTED IMMEDIATELY:  *FEVER GREATER THAN 101.0 F  *CHILLS WITH OR WITHOUT FEVER  NAUSEA AND VOMITING THAT IS NOT CONTROLLED WITH YOUR NAUSEA MEDICATION  *UNUSUAL SHORTNESS OF BREATH  *UNUSUAL BRUISING OR BLEEDING  TENDERNESS IN MOUTH AND THROAT WITH OR WITHOUT PRESENCE OF ULCERS  *URINARY PROBLEMS  *BOWEL PROBLEMS  UNUSUAL RASH Items with * indicate a potential emergency and should be followed up as soon as possible. If you have an emergency after office hours please contact your primary care physician or go to the nearest emergency department.  Please call the clinic during office hours if you have any questions or concerns.   You may also contact the Patient Navigator at [phone] should you have any questions or need assistance in obtaining follow up care.      Resources For Cancer Patients and their Caregivers ? American Cancer Society: Can assist with transportation, wigs, general needs, runs Look Good Feel Better.        253-344-6769 ? Cancer Care: Provides financial assistance, online support groups, medication/co-pay assistance.  1-800-813-HOPE 306-090-0220) ? [place] Assists [place] cancer patients and their families through emotional , educational and financial support.  918-377-6260 ? [place] DSS Where to apply for food stamps, Medicaid and utility assistance. 904 295 6166 ? [employer]: Transportation to medical appointments. 856-526-1609 ? Social Security Administration: May apply for disability if have a Stage IV cancer. 443-823-7024 279 010 4799 ? [employer], Disability and Transit Services: Assists with nutrition, care and transit needs. [phone]

## 2015-11-05 ENCOUNTER — Inpatient Hospital Stay (HOSPITAL_COMMUNITY): Payer: Self-pay

## 2015-11-05 ENCOUNTER — Ambulatory Visit (HOSPITAL_COMMUNITY): Payer: Self-pay

## 2015-11-10 ENCOUNTER — Other Ambulatory Visit (HOSPITAL_COMMUNITY): Payer: Self-pay

## 2015-11-11 ENCOUNTER — Inpatient Hospital Stay (HOSPITAL_COMMUNITY): Payer: Self-pay

## 2015-11-11 ENCOUNTER — Ambulatory Visit (HOSPITAL_COMMUNITY): Payer: Self-pay | Admitting: Hematology & Oncology

## 2015-11-11 ENCOUNTER — Encounter (HOSPITAL_COMMUNITY): Payer: Self-pay | Admitting: Oncology

## 2015-11-11 ENCOUNTER — Encounter (HOSPITAL_BASED_OUTPATIENT_CLINIC_OR_DEPARTMENT_OTHER): Payer: Medicaid Other | Admitting: Oncology

## 2015-11-11 ENCOUNTER — Encounter (HOSPITAL_COMMUNITY): Payer: Self-pay

## 2015-11-11 ENCOUNTER — Encounter (HOSPITAL_BASED_OUTPATIENT_CLINIC_OR_DEPARTMENT_OTHER): Payer: Medicaid Other

## 2015-11-11 VITALS — BP 117/66 | HR 85 | Temp 97.8°F | Resp 20 | Wt 103.0 lb

## 2015-11-11 DIAGNOSIS — N183 Chronic kidney disease, stage 3 unspecified: Secondary | ICD-10-CM | POA: Insufficient documentation

## 2015-11-11 DIAGNOSIS — C140 Malignant neoplasm of pharynx, unspecified: Secondary | ICD-10-CM

## 2015-11-11 DIAGNOSIS — L539 Erythematous condition, unspecified: Secondary | ICD-10-CM

## 2015-11-11 DIAGNOSIS — C01 Malignant neoplasm of base of tongue: Secondary | ICD-10-CM | POA: Diagnosis not present

## 2015-11-11 DIAGNOSIS — Z5112 Encounter for antineoplastic immunotherapy: Secondary | ICD-10-CM

## 2015-11-11 DIAGNOSIS — Z5111 Encounter for antineoplastic chemotherapy: Secondary | ICD-10-CM

## 2015-11-11 DIAGNOSIS — C7800 Secondary malignant neoplasm of unspecified lung: Secondary | ICD-10-CM

## 2015-11-11 DIAGNOSIS — D631 Anemia in chronic kidney disease: Secondary | ICD-10-CM

## 2015-11-11 HISTORY — DX: Anemia in chronic kidney disease: D63.1

## 2015-11-11 HISTORY — DX: Chronic kidney disease, stage 3 unspecified: N18.30

## 2015-11-11 LAB — MAGNESIUM: Magnesium: 2.1 mg/dL (ref 1.7–2.4)

## 2015-11-11 LAB — CBC WITH DIFFERENTIAL/PLATELET
Basophils Absolute: 0.1 10*3/uL (ref 0.0–0.1)
Basophils Relative: 1 %
EOS ABS: 0 10*3/uL (ref 0.0–0.7)
Eosinophils Relative: 0 %
HCT: 26.6 % — ABNORMAL LOW (ref 36.0–46.0)
Hemoglobin: 8.7 g/dL — ABNORMAL LOW (ref 12.0–15.0)
Lymphocytes Relative: 4 %
Lymphs Abs: 0.5 10*3/uL — ABNORMAL LOW (ref 0.7–4.0)
MCH: 31.5 pg (ref 26.0–34.0)
MCHC: 32.7 g/dL (ref 30.0–36.0)
MCV: 96.4 fL (ref 78.0–100.0)
MONO ABS: 1.2 10*3/uL — AB (ref 0.1–1.0)
MONOS PCT: 8 %
NEUTROS PCT: 87 %
Neutro Abs: 12.2 10*3/uL — ABNORMAL HIGH (ref 1.7–7.7)
PLATELETS: 135 10*3/uL — AB (ref 150–400)
RBC: 2.76 MIL/uL — AB (ref 3.87–5.11)
RDW: 17 % — ABNORMAL HIGH (ref 11.5–15.5)
WBC: 14.3 10*3/uL — AB (ref 4.0–10.5)

## 2015-11-11 LAB — COMPREHENSIVE METABOLIC PANEL
ALBUMIN: 3.6 g/dL (ref 3.5–5.0)
ALT: 10 U/L — AB (ref 14–54)
AST: 21 U/L (ref 15–41)
Alkaline Phosphatase: 138 U/L — ABNORMAL HIGH (ref 38–126)
Anion gap: 9 (ref 5–15)
BUN: 31 mg/dL — AB (ref 6–20)
CHLORIDE: 86 mmol/L — AB (ref 101–111)
CO2: 33 mmol/L — AB (ref 22–32)
CREATININE: 1.39 mg/dL — AB (ref 0.44–1.00)
Calcium: 8.7 mg/dL — ABNORMAL LOW (ref 8.9–10.3)
GFR calc Af Amer: 51 mL/min — ABNORMAL LOW (ref >60)
GFR calc non Af Amer: 44 mL/min — ABNORMAL LOW (ref >60)
Glucose, Bld: 98 mg/dL (ref 65–99)
Potassium: 4.1 mmol/L (ref 3.5–5.1)
SODIUM: 128 mmol/L — AB (ref 135–145)
Total Bilirubin: 0.4 mg/dL (ref 0.3–1.2)
Total Protein: 7.3 g/dL (ref 6.5–8.1)

## 2015-11-11 MED ORDER — CIPROFLOXACIN HCL 500 MG PO TABS: 500.0000 mg | ORAL_TABLET | Freq: Two times a day (BID) | ORAL | Status: AC

## 2015-11-11 MED ORDER — SODIUM CHLORIDE 0.9 % IV SOLN
Freq: Once | INTRAVENOUS | Status: AC
  Administered 2015-11-11: 11:00:00 via INTRAVENOUS

## 2015-11-11 MED ORDER — SODIUM CHLORIDE 0.9% FLUSH: 10.0000 mL | INTRAVENOUS | Status: DC | PRN

## 2015-11-11 MED ORDER — HEPARIN SOD (PORK) LOCK FLUSH 100 UNIT/ML IV SOLN: 500.0000 [IU] | Freq: Once | INTRAVENOUS | Status: DC | PRN

## 2015-11-11 MED ORDER — SODIUM CHLORIDE 0.9 % IV SOLN
800.0000 mg/m2/d | INTRAVENOUS | Status: DC
  Administered 2015-11-11: 4850 mg via INTRAVENOUS
  Filled 2015-11-11: qty 97

## 2015-11-11 MED ORDER — DIPHENHYDRAMINE HCL 50 MG/ML IJ SOLN
50.0000 mg | Freq: Once | INTRAMUSCULAR | Status: AC
  Administered 2015-11-11: 50 mg via INTRAVENOUS
  Filled 2015-11-11: qty 1

## 2015-11-11 MED ORDER — CETUXIMAB CHEMO IV INJECTION 200 MG/100ML
250.0000 mg/m2 | Freq: Once | INTRAVENOUS | Status: AC
  Administered 2015-11-11: 400 mg via INTRAVENOUS
  Filled 2015-11-11: qty 200

## 2015-11-11 MED ORDER — SODIUM CHLORIDE 0.9 % IV SOLN
313.5000 mg | Freq: Once | INTRAVENOUS | Status: AC
  Administered 2015-11-11: 310 mg via INTRAVENOUS
  Filled 2015-11-11: qty 31

## 2015-11-11 MED ORDER — CLOTRIMAZOLE-BETAMETHASONE 1-0.05 % EX CREA: 1.0000 "application " | TOPICAL_CREAM | Freq: Two times a day (BID) | CUTANEOUS | Status: AC

## 2015-11-11 MED ORDER — PALONOSETRON HCL INJECTION 0.25 MG/5ML
0.2500 mg | Freq: Once | INTRAVENOUS | Status: AC
  Administered 2015-11-11: 0.25 mg via INTRAVENOUS
  Filled 2015-11-11: qty 5

## 2015-11-11 MED ORDER — SODIUM CHLORIDE 0.9 % IV SOLN
10.0000 mg | Freq: Once | INTRAVENOUS | Status: AC
  Administered 2015-11-11: 10 mg via INTRAVENOUS
  Filled 2015-11-11: qty 1

## 2015-11-11 NOTE — Patient Instructions (Signed)
[place] at [facility] Discharge Instructions  RECOMMENDATIONS MADE BY THE CONSULTANT AND ANY TEST RESULTS WILL BE SENT TO YOUR REFERRING PHYSICIAN.  Rx for Cipro (antibiotic) sent to your pharmacy.  Take this medication twice per day (every 12 hours). Rx for Lotrisone cream sent to your pharmacy as well.  Please apply this cream to your rash avoiding contact with the ostomy.  Otherwise, keep this area as dry as possible. Return as scheduled.  Thank you for choosing [place] at [facility] to provide your oncology and hematology care.  To afford each patient quality time with our provider, please arrive at least 15 minutes before your scheduled appointment time.   Beginning January 23rd 2017 lab work for the [facility] will be done in the  Main lab at [facility] on 1st floor. If you have a lab appointment with the [facility] please come in thru the  Main Entrance and check in at the main information desk  You need to re-schedule your appointment should you arrive 10 or more minutes late.  We strive to give you quality time with our providers, and arriving late affects you and other patients whose appointments are after yours.  Also, if you no show three or more times for appointments you may be dismissed from the clinic at the providers discretion.     Again, thank you for choosing [facility].  Our hope is that these requests will decrease the amount of time that you wait before being seen by our physicians.       _____________________________________________________________  Should you have questions after your visit to [facility], please contact our office at [phone] between the hours of 8:30 a.m. and 4:30 p.m.  Voicemails left after 4:30 p.m. will not be returned until the following business day.  For prescription refill requests, have your pharmacy contact our office.         Resources For  Cancer Patients and their Caregivers ? American Cancer Society: Can assist with transportation, wigs, general needs, runs Look Good Feel Better.        408-711-2883 ? Cancer Care: Provides financial assistance, online support groups, medication/co-pay assistance.  1-800-813-HOPE 912-741-2626) ? [place] Assists [place] cancer patients and their families through emotional , educational and financial support.  256 160 6759 ? [place] DSS Where to apply for food stamps, Medicaid and utility assistance. 352-136-9033 ? [employer]: Transportation to medical appointments. (781)710-1773 ? Social Security Administration: May apply for disability if have a Stage IV cancer. 281 192 2900 (321)533-3733 ? [employer], Disability and Transit Services: Assists with nutrition, care and transit needs. [place] Support Programs: @10RELATIVEDAYS @ > Cancer Support Group  2nd Tuesday of the month 1pm-2pm, Journey Room  > Creative Journey  3rd Tuesday of the month 1130am-1pm, Journey Room  > Look Good Feel Better  1st Wednesday of the month 10am-12 noon, Journey Room (Call [place] to register [phone])

## 2015-11-11 NOTE — Progress Notes (Signed)
Patient seen as a work in today. She is here for chemotherapy. Nursing reports irritation of ostomy from PEG tube. Next  Patient shows me her PEG tube site. Her stopper/bumper is in place with erythema noted expanding past the external bumper. She notes that it is uncomfortable and irritating. She's been using triple antibiotic ointment on it without improvement. The site is clean, but not dressed. She denies any fevers.  Physical exam: Gen.: No acute distress. Pleasant. Accompanied by friends. Abdomen: PEG in place. Erythema noted extending past the external bumper border. Border is well demarcated. Diameter of erythematous irritation is approximately 2 cm in size.  I electronically prescribed Cipro 500 mg every 12 hours by mouth for 5 days. Additionally of also given Lotrisone cream and this can be applied to the irritated area not to come in contact with ostomy site. She will call if not improved. She is advised to keep the area clean and dry.  Patient and plan discussed with Dr. Ancil Linsey and she is in agreement with the aforementioned.   KEFALAS,THOMAS, PA-C 11/11/2015 2:02 PM

## 2015-11-11 NOTE — Patient Instructions (Signed)
[facility] Discharge Instructions for Patients Receiving Chemotherapy   Beginning January 23rd 2017 lab work for the [facility] will be done in the  Main lab at [facility] on 1st floor. If you have a lab appointment with the [facility] please come in thru the  Main Entrance and check in at the main information desk   Today you received the following chemotherapy agents erbitux and carbo and 25fu Follow up as scheduled Please call the clinic if you ahe any questions or concerns  To help prevent nausea and vomiting after your treatment, we encourage you to take your nausea medication    If you develop nausea and vomiting, or diarrhea that is not controlled by your medication, call the clinic.  The clinic phone number is [phone]. Office hours are Monday-Friday 8:30am-5:00pm.  BELOW ARE SYMPTOMS THAT SHOULD BE REPORTED IMMEDIATELY:  *FEVER GREATER THAN 101.0 F  *CHILLS WITH OR WITHOUT FEVER  NAUSEA AND VOMITING THAT IS NOT CONTROLLED WITH YOUR NAUSEA MEDICATION  *UNUSUAL SHORTNESS OF BREATH  *UNUSUAL BRUISING OR BLEEDING  TENDERNESS IN MOUTH AND THROAT WITH OR WITHOUT PRESENCE OF ULCERS  *URINARY PROBLEMS  *BOWEL PROBLEMS  UNUSUAL RASH Items with * indicate a potential emergency and should be followed up as soon as possible. If you have an emergency after office hours please contact your primary care physician or go to the nearest emergency department.  Please call the clinic during office hours if you have any questions or concerns.   You may also contact the Patient Navigator at [phone] should you have any questions or need assistance in obtaining follow up care.      Resources For Cancer Patients and their Caregivers ? American Cancer Society: Can assist with transportation, wigs, general needs, runs Look Good Feel Better.        [phone] ? Cancer Care: Provides financial assistance, online support groups, medication/co-pay  assistance.  1-800-813-HOPE (667)312-0554) ? [place] Assists [place] cancer patients and their families through emotional , educational and financial support.  (403)419-6319 ? [place] DSS Where to apply for food stamps, Medicaid and utility assistance. (201)293-3350 ? RCATS: Transportation to medical appointments. ([phone] ? Social Security Administration: May apply for disability if have a Stage IV cancer. 514-155-8585 [phone] ? [employer], Disability and Transit Services: Assists with nutrition, care and transit needs. [phone]

## 2015-11-11 NOTE — Progress Notes (Signed)
Shelby Larson Tolerated chemotherapy well  Discharged ambulatory with pump

## 2015-11-12 ENCOUNTER — Inpatient Hospital Stay (HOSPITAL_COMMUNITY): Payer: Self-pay

## 2015-11-12 ENCOUNTER — Ambulatory Visit (HOSPITAL_COMMUNITY): Payer: Self-pay | Admitting: Hematology & Oncology

## 2015-11-15 ENCOUNTER — Encounter (HOSPITAL_BASED_OUTPATIENT_CLINIC_OR_DEPARTMENT_OTHER): Payer: Medicaid Other

## 2015-11-15 ENCOUNTER — Encounter (HOSPITAL_COMMUNITY): Payer: Self-pay

## 2015-11-15 ENCOUNTER — Other Ambulatory Visit (HOSPITAL_COMMUNITY): Payer: Self-pay | Admitting: Oncology

## 2015-11-15 VITALS — BP 119/70 | HR 87 | Resp 18

## 2015-11-15 DIAGNOSIS — C01 Malignant neoplasm of base of tongue: Secondary | ICD-10-CM | POA: Diagnosis present

## 2015-11-15 DIAGNOSIS — C7800 Secondary malignant neoplasm of unspecified lung: Secondary | ICD-10-CM | POA: Diagnosis not present

## 2015-11-15 DIAGNOSIS — C140 Malignant neoplasm of pharynx, unspecified: Secondary | ICD-10-CM

## 2015-11-15 DIAGNOSIS — Z5189 Encounter for other specified aftercare: Secondary | ICD-10-CM

## 2015-11-15 MED ORDER — SODIUM CHLORIDE 0.9% FLUSH
10.0000 mL | INTRAVENOUS | Status: DC | PRN
  Administered 2015-11-15: 10 mL
  Filled 2015-11-15: qty 10

## 2015-11-15 MED ORDER — HEPARIN SOD (PORK) LOCK FLUSH 100 UNIT/ML IV SOLN
500.0000 [IU] | Freq: Once | INTRAVENOUS | Status: AC | PRN
  Administered 2015-11-15: 500 [IU]

## 2015-11-15 MED ORDER — HEPARIN SOD (PORK) LOCK FLUSH 100 UNIT/ML IV SOLN
INTRAVENOUS | Status: AC
  Filled 2015-11-15: qty 5

## 2015-11-15 MED ORDER — PEGFILGRASTIM 6 MG/0.6ML ~~LOC~~ PSKT
PREFILLED_SYRINGE | SUBCUTANEOUS | Status: AC
  Filled 2015-11-15: qty 0.6

## 2015-11-15 MED ORDER — NYSTATIN 100000 UNIT/ML MT SUSP: 5.0000 mL | Freq: Four times a day (QID) | OROMUCOSAL | Status: AC

## 2015-11-15 MED ORDER — PEGFILGRASTIM 6 MG/0.6ML ~~LOC~~ PSKT
6.0000 mg | PREFILLED_SYRINGE | Freq: Once | SUBCUTANEOUS | Status: AC
  Administered 2015-11-15: 6 mg via SUBCUTANEOUS

## 2015-11-15 NOTE — Progress Notes (Signed)
Shelby Larson presented for Portacath de-access and flush.  Proper placement of portacath confirmed by CXR.  Portacath located right chest wall accessed with  H 20 needle.  Good blood return present. Portacath flushed with 28ml NS and 500U/58ml Heparin and needle removed intact.  Procedure tolerated well and without incident.  Tolerated well   Nystatin refilled    .Marland KitchenWinifred Olive Frenettepresents today for neulasta OBI placement per MD orders. OBI device filled per protocol and placed on left Upper Arm. Needle/catheter placement noted prior to patient leaving. Tolerated without incident and aware of injection to be delivered in  27 hours.

## 2015-11-15 NOTE — Patient Instructions (Signed)
[place] at [facility] Discharge Instructions  RECOMMENDATIONS MADE BY THE CONSULTANT AND ANY TEST RESULTS WILL BE SENT TO YOUR REFERRING PHYSICIAN.   Pump removed  neulata on-pro placed today Follow up as scheduled    Thank you for choosing [place] at [facility] to provide your oncology and hematology care.  To afford each patient quality time with our provider, please arrive at least 15 minutes before your scheduled appointment time.   Beginning January 23rd 2017 lab work for the [facility] will be done in the  Main lab at [facility] on 1st floor. If you have a lab appointment with the [facility] please come in thru the  Main Entrance and check in at the main information desk  You need to re-schedule your appointment should you arrive 10 or more minutes late.  We strive to give you quality time with our providers, and arriving late affects you and other patients whose appointments are after yours.  Also, if you no show three or more times for appointments you may be dismissed from the clinic at the providers discretion.     Again, thank you for choosing [facility].  Our hope is that these requests will decrease the amount of time that you wait before being seen by our physicians.       _____________________________________________________________  Should you have questions after your visit to [facility], please contact our office at [phone] between the hours of 8:30 a.m. and 4:30 p.m.  Voicemails left after 4:30 p.m. will not be returned until the following business day.  For prescription refill requests, have your pharmacy contact our office.         Resources For Cancer Patients and their Caregivers ? American Cancer Society: Can assist with transportation, wigs, general needs, runs Look Good Feel Better.        857-674-3573 ? Cancer Care: Provides financial assistance, online  support groups, medication/co-pay assistance.  1-800-813-HOPE (973)579-9979) ? [place] Assists [place] cancer patients and their families through emotional , educational and financial support.  [phone] [phone] ? [place] DSS Where to apply for food stamps, Medicaid and utility assistance. 825-449-6551 ? RCATS: Transportation to medical appointments. 218-429-2660 ? Social Security Administration: May apply for disability if have a Stage IV cancer. [phone] 361-335-9942 ? [employer], Disability and Transit Services: Assists with nutrition, care and transit needs. [place] Support Programs: @10RELATIVEDAYS @ > Cancer Support Group  2nd Tuesday of the month 1pm-2pm, Journey Room  > Creative Journey  3rd Tuesday of the month 1130am-1pm, Journey Room  > Look Good Feel Better  1st Wednesday of the month 10am-12 noon, Journey Room (Call [place] to register [phone])

## 2015-11-16 ENCOUNTER — Encounter (HOSPITAL_COMMUNITY): Payer: Self-pay

## 2015-11-16 ENCOUNTER — Ambulatory Visit (HOSPITAL_COMMUNITY): Payer: Self-pay

## 2015-11-16 ENCOUNTER — Other Ambulatory Visit (HOSPITAL_COMMUNITY): Payer: Self-pay

## 2015-11-18 ENCOUNTER — Encounter (HOSPITAL_BASED_OUTPATIENT_CLINIC_OR_DEPARTMENT_OTHER): Payer: Medicaid Other

## 2015-11-18 ENCOUNTER — Encounter (HOSPITAL_BASED_OUTPATIENT_CLINIC_OR_DEPARTMENT_OTHER): Payer: Medicaid Other | Admitting: Hematology & Oncology

## 2015-11-18 ENCOUNTER — Encounter (HOSPITAL_COMMUNITY): Payer: Self-pay | Admitting: Hematology & Oncology

## 2015-11-18 ENCOUNTER — Encounter: Payer: Self-pay | Admitting: Dietician

## 2015-11-18 VITALS — BP 102/62 | HR 85 | Temp 98.0°F | Resp 20 | Wt 99.4 lb

## 2015-11-18 VITALS — BP 103/67 | HR 83 | Temp 98.4°F | Resp 20 | Wt 99.4 lb

## 2015-11-18 DIAGNOSIS — N183 Chronic kidney disease, stage 3 unspecified: Secondary | ICD-10-CM

## 2015-11-18 DIAGNOSIS — C01 Malignant neoplasm of base of tongue: Secondary | ICD-10-CM

## 2015-11-18 DIAGNOSIS — C7802 Secondary malignant neoplasm of left lung: Secondary | ICD-10-CM

## 2015-11-18 DIAGNOSIS — C140 Malignant neoplasm of pharynx, unspecified: Secondary | ICD-10-CM

## 2015-11-18 DIAGNOSIS — C7800 Secondary malignant neoplasm of unspecified lung: Secondary | ICD-10-CM | POA: Diagnosis not present

## 2015-11-18 DIAGNOSIS — E871 Hypo-osmolality and hyponatremia: Secondary | ICD-10-CM

## 2015-11-18 DIAGNOSIS — Z79899 Other long term (current) drug therapy: Secondary | ICD-10-CM

## 2015-11-18 DIAGNOSIS — F418 Other specified anxiety disorders: Secondary | ICD-10-CM

## 2015-11-18 DIAGNOSIS — N189 Chronic kidney disease, unspecified: Secondary | ICD-10-CM

## 2015-11-18 DIAGNOSIS — D631 Anemia in chronic kidney disease: Secondary | ICD-10-CM

## 2015-11-18 DIAGNOSIS — R634 Abnormal weight loss: Secondary | ICD-10-CM | POA: Diagnosis not present

## 2015-11-18 DIAGNOSIS — Z5112 Encounter for antineoplastic immunotherapy: Secondary | ICD-10-CM | POA: Diagnosis present

## 2015-11-18 LAB — CBC WITH DIFFERENTIAL/PLATELET
BASOS ABS: 0 10*3/uL (ref 0.0–0.1)
Basophils Relative: 0 %
Eosinophils Absolute: 0 10*3/uL (ref 0.0–0.7)
Eosinophils Relative: 0 %
HCT: 24.6 % — ABNORMAL LOW (ref 36.0–46.0)
HEMOGLOBIN: 8.3 g/dL — AB (ref 12.0–15.0)
Lymphocytes Relative: 1 %
Lymphs Abs: 0.2 10*3/uL — ABNORMAL LOW (ref 0.7–4.0)
MCH: 31.4 pg (ref 26.0–34.0)
MCHC: 33.7 g/dL (ref 30.0–36.0)
MCV: 93.2 fL (ref 78.0–100.0)
Monocytes Absolute: 0.5 10*3/uL (ref 0.1–1.0)
Monocytes Relative: 2 %
NEUTROS PCT: 97 %
Neutro Abs: 23.5 10*3/uL — ABNORMAL HIGH (ref 1.7–7.7)
Platelets: 263 10*3/uL (ref 150–400)
RBC: 2.64 MIL/uL — AB (ref 3.87–5.11)
RDW: 18 % — ABNORMAL HIGH (ref 11.5–15.5)
WBC: 24.2 10*3/uL — AB (ref 4.0–10.5)

## 2015-11-18 LAB — MAGNESIUM: Magnesium: 1.8 mg/dL (ref 1.7–2.4)

## 2015-11-18 LAB — COMPREHENSIVE METABOLIC PANEL
ALBUMIN: 3.7 g/dL (ref 3.5–5.0)
ALK PHOS: 145 U/L — AB (ref 38–126)
ALT: 11 U/L — ABNORMAL LOW (ref 14–54)
AST: 22 U/L (ref 15–41)
Anion gap: 10 (ref 5–15)
BILIRUBIN TOTAL: 0.6 mg/dL (ref 0.3–1.2)
BUN: 44 mg/dL — AB (ref 6–20)
CO2: 30 mmol/L (ref 22–32)
Calcium: 8.6 mg/dL — ABNORMAL LOW (ref 8.9–10.3)
Chloride: 91 mmol/L — ABNORMAL LOW (ref 101–111)
Creatinine, Ser: 1.43 mg/dL — ABNORMAL HIGH (ref 0.44–1.00)
GFR calc Af Amer: 49 mL/min — ABNORMAL LOW (ref >60)
GFR calc non Af Amer: 42 mL/min — ABNORMAL LOW (ref >60)
GLUCOSE: 104 mg/dL — AB (ref 65–99)
POTASSIUM: 3.5 mmol/L (ref 3.5–5.1)
Sodium: 131 mmol/L — ABNORMAL LOW (ref 135–145)
TOTAL PROTEIN: 7.2 g/dL (ref 6.5–8.1)

## 2015-11-18 MED ORDER — SODIUM CHLORIDE 0.9 % IV SOLN
Freq: Once | INTRAVENOUS | Status: AC
  Administered 2015-11-18: 12:00:00 via INTRAVENOUS

## 2015-11-18 MED ORDER — QUETIAPINE FUMARATE 50 MG PO TABS: 50.0000 mg | ORAL_TABLET | Freq: Every day | ORAL | Status: AC | PRN

## 2015-11-18 MED ORDER — SODIUM CHLORIDE 0.9% FLUSH
10.0000 mL | INTRAVENOUS | Status: DC | PRN
  Administered 2015-11-18: 10 mL
  Filled 2015-11-18: qty 10

## 2015-11-18 MED ORDER — DARBEPOETIN ALFA 100 MCG/0.5ML IJ SOSY
80.0000 ug | PREFILLED_SYRINGE | Freq: Once | INTRAMUSCULAR | Status: AC
  Administered 2015-11-18: 80 ug via SUBCUTANEOUS
  Filled 2015-11-18: qty 0.5

## 2015-11-18 MED ORDER — FENTANYL 100 MCG/HR TD PT72: MEDICATED_PATCH | TRANSDERMAL | Status: AC

## 2015-11-18 MED ORDER — HYDROCODONE-ACETAMINOPHEN 7.5-500 MG/15ML PO SOLN: 15.0000 mL | ORAL | Status: AC | PRN

## 2015-11-18 MED ORDER — DIPHENHYDRAMINE HCL 50 MG/ML IJ SOLN
50.0000 mg | Freq: Once | INTRAMUSCULAR | Status: AC
  Administered 2015-11-18: 50 mg via INTRAVENOUS
  Filled 2015-11-18: qty 1

## 2015-11-18 MED ORDER — CETUXIMAB CHEMO IV INJECTION 200 MG/100ML
250.0000 mg/m2 | Freq: Once | INTRAVENOUS | Status: AC
  Administered 2015-11-18: 400 mg via INTRAVENOUS
  Filled 2015-11-18: qty 200

## 2015-11-18 MED ORDER — HEPARIN SOD (PORK) LOCK FLUSH 100 UNIT/ML IV SOLN
500.0000 [IU] | Freq: Once | INTRAVENOUS | Status: AC | PRN
  Administered 2015-11-18: 500 [IU]
  Filled 2015-11-18: qty 5

## 2015-11-18 NOTE — Progress Notes (Signed)
Patient identified by oncology MD to not be administering adequate TF through her PEG.   Pt is well known to RD as she has been a frequent patient at the cancer center for > 1 year.   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 Has had PEG for > 1 year  Contacted Pt by Visiting during her appointment   Wt Readings from Last 10 Encounters:  11/18/15 99 lb 6.4 oz (45.088 kg)  11/18/15 99 lb 6.4 oz (45.088 kg)  11/11/15 103 lb (46.72 kg)  11/04/15 101 lb 3.2 oz (45.904 kg)  10/28/15 101 lb 9.6 oz (46.085 kg)  10/22/15 106 lb 3.2 oz (48.172 kg)  10/19/15 106 lb 1.6 oz (48.127 kg)  10/07/15 107 lb 8 oz (48.762 kg)  10/06/15 107 lb 8 oz (48.762 kg)  10/05/15 104 lb (47.174 kg)   Patient weight has decreased by 5-8 lbs in the last month. Overall down 6 lbs since recurrence was found at the end of March this year. During her remission, defined by ONC notes as starting 02/22/15, her weight got as high 115-117 lbs.   She looks much thinner and cachetic today.She exhibits a much more sunken and defined face  Patient reports oral intake as Non-existent and a very large multitude of symptoms including, but not limited to n/v/c/d, pain, very irritated PEG site, stomatitis, early satiety.  Pt states she cannot eat anything by mouth. If she does she will throw up. She is seen drinking water as saying this. She says she can take small sips of water, but nothing else.   She relies on her PEG tube for nutrition and hydration. She says she administers 4-5 cans Jevity 1.5 via pump daily.  Her mother spoke up and says that pt has never done 5 cans, inciting a brief quarrel. Pt only flushes at the end of her regimen. She puts 4 syringes of water into her PEG at that time, but notes not all of this  goes through due to limited stomach capacity. The rate she administers at varies and she will play with the rate depending on how full she is getting.   Assuming she is only placing 4 cans, Her current TF regimen is providing: 1422 kcals 60 g Pro 720 ml fluid +240 from flushes.   RD explained that her current regimen is far from adequate. She is receiving < 1 liter of fluid per day. She says the reason she doesn't do more is due to bloating and strong sense of fullness. Acknowledged this is unsurprising given her size. Did not recommend administering 4 syringes at the end of her TF feed as this is when she is already at her fullest. Potentially, a motility agent may help with gastric emptying if this is a large hindrance to her nutrition.   Estimated needs 1600-1800 kcals (35-40 kcal/kg bw) 68 g Pro (>1.5 g/kg bw) >1.6 liters fluid daily  RD went over new recommendations: 5 cans TF/ day is minimum. Increase Flushes to 120 mls q 4 hrs. This would provide 1778 kcals 76 g Pro 946ml fluid + 720 from flushes  Oral intake is still encouraged as "icing on the cake". She says she is trying to eat/drink orally.   She notes having intermittent constipation/diarrhea. Elucidated that she is on a fiber formula and that,  without adequate hydration, this can worsen constipation. Briefly talked about switching back to Osmolite, but what the first approach should be is improving her hydration. She is also on a very high number of medications that could be more the problem. She notes having 1 bm every 3-4 days  She showed her PEG site. It is inflamed and she states it hurts to eat, administer TF, flush or even when she breathes or moves in general. From notes and what she reports, she places a large number of topicals on the site. Explained that abx ointments are not reccommended and can irritate the skin with constant application.  She is already taking oral abx anyway. Spoke with MD/PA who were in agreement  to stopping topicals. If this does not improve, she would likely need referral to GI MD or provider with more experience in PEGs  She is going to the beach next week. She asked about entering the water. Did not recommend prolonged submersion of site as the brine may irritate her already painful PEG site, however if this is only briefly submerged and cleaned afterwards then should be ok from standpoint of her PEG. Unsure if MD/PA have concerns with this. She has stated quality of time is much more important than quantity. During our discussion she repeatedly mentioned how she doesn't have much of this anymore.   Left written instructions and contact info.    Burtis Junes RD, LDN Nutrition Pager: [phone] 11/18/2015 1:39 PM

## 2015-11-18 NOTE — Patient Instructions (Addendum)
[place] at [facility] Discharge Instructions  RECOMMENDATIONS MADE BY THE CONSULTANT AND ANY TEST RESULTS WILL BE SENT TO YOUR REFERRING PHYSICIAN.  You will skip the Erbitux and Aranesp on 12/16/15 because you will be at the beach!!!!  Return to see Dr. Whitney Muse prior to your next chemo treatment. Shelby Larson will see you that same day.   Follow up with [name] next week regarding Erbitux toxicity - he will re evaluate you.   Dr. Whitney Muse wants you to put the Hydrocodone liquid through your peg tube.         Thank you for choosing [place] at [facility] to provide your oncology and hematology care.  To afford each patient quality time with our provider, please arrive at least 15 minutes before your scheduled appointment time.   Beginning January 23rd 2017 lab work for the [facility] will be done in the  Main lab at [facility] on 1st floor. If you have a lab appointment with the [facility] please come in thru the  Main Entrance and check in at the main information desk  You need to re-schedule your appointment should you arrive 10 or more minutes late.  We strive to give you quality time with our providers, and arriving late affects you and other patients whose appointments are after yours.  Also, if you no show three or more times for appointments you may be dismissed from the clinic at the providers discretion.     Again, thank you for choosing [facility].  Our hope is that these requests will decrease the amount of time that you wait before being seen by our physicians.       _____________________________________________________________  Should you have questions after your visit to [facility], please contact our office at [phone] between the hours of 8:30 a.m. and 4:30 p.m.  Voicemails left after 4:30 p.m. will not be returned until the following business day.  For prescription refill requests, have  your pharmacy contact our office.         Resources For Cancer Patients and their Caregivers ? American Cancer Society: Can assist with transportation, wigs, general needs, runs Look Good Feel Better.        ([phone] ? Cancer Care: Provides financial assistance, online support groups, medication/co-pay assistance.  1-800-813-HOPE 678-122-4060) ? [place] Assists [place] cancer patients and their families through emotional , educational and financial support.  680-053-4344 ? [place] DSS Where to apply for food stamps, Medicaid and utility assistance. 780-387-9097 ? [employer]ATS: Transportation to medical appointments. 216-688-7433 ? Social Security Administration: May apply for disability if have a Stage IV cancer. 6670233066 719-318-1812 ? [employer], Disability and Transit Services: Assists with nutrition, care and transit needs. [place] Support Programs: @10RELATIVEDAYS @ > Cancer Support Group  2nd Tuesday of the month 1pm-2pm, Journey Room  > Creative Journey  3rd Tuesday of the month 1130am-1pm, Journey Room  > Look Good Feel Better  1st Wednesday of the month 10am-12 noon, Journey Room (Call [employer] to register [phone])

## 2015-11-18 NOTE — Progress Notes (Signed)
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 Tongue, biopsy, left base/ pharynx mass - INVASIVE MODERATELY DIFFERENTIATED SQUAMOUS CELL CARCINOMA. - SEE COMMENT. 2. Lymph nodes, radical neck dissection, Left - THREE OF SIXTEEN LYMPH NODES POSITIVE FOR METASTATIC SQUAMOUS CELL [facility]   09/18/2014 Surgery Multiple extraction of tooth numbers 2, 14, 15, 18, and 31.  4 Quadrants of alveoloplasty.  Gross debridement of remaining dentition. Mandibular left lingual torus reduction   10/08/2014 - 11/19/2014 Chemotherapy Cisplatin every 21 days   10/09/2014 - 11/30/2014 Radiation Therapy [name] with twice daily dosing at the end of treatment at patient's request due to trip to [facility] against medical advice.   02/22/2015 PET scan Prior oropharyngeal mass has essentially resolved.  No findings specific for metastatic disease.   02/22/2015 Remission    09/15/2015 Progression    09/15/2015 PET scan 2.2 x 4.2 cm soft tissue mass along anterior aspect of heart, 1.7 x 4.7 cm mass along L heart border. thoracic LAD   10/07/2015 Procedure Video bronchoscopy, endobronchial ultrasound, L anterior mediastinotomy with Dr. Modesto Charon   10/08/2015 Pathology Results Squamous Cell Carcinoma   10/22/2015 -  Chemotherapy Carboplatin Day 1 and 5 FU days 1-5 every 21 days   10/22/2015 -  Antibody Plan Cetuximab q 7 days   11/11/2015  Treatment Plan Change 5FU dose reduced by 20% for cycle #2.     CURRENT THERAPY: Carboplatin/5-FU/Erbitux  INTERVAL HISTORY: Shelby Larson 50 y.o. 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 She has completed concurrent XRT/cisplatin. PET imaging performed in March was highly suspicious for recurrence. She has undergone biopsy with Dr. Roxan Hockey which has unfortunately revealed recurrent disease within the chest.  Shelby Larson returns to the [facility] today accompanied by her mother. She is on Cycle #2 day 8 of Cetuximab. At the start of her appointment, she is receiving saline in preparation for therapy.  She says she's taking antibiotics but indicates that feeding tube site is "getting worse and more and more painful, something's gotta give." She says it's making her raw and was bleeding this morning. She says something has to change on that immediately She also adds that her teeth feel like razor blades and everything burns in her mouth. She notes that her throat is sore this time and worse than last time. She also notes "white pustules" in her mouth.  She comments about scabs on her head as well.  She has called Dr. Hoyt Koch several times about her dental work, but was told they want [place] to call them instead.  Beach trip with mother and other relatives; she would miss a dose and asks if she can just get the dose a little  later.  She notes that all of this has gotten past the point of annoying and is now just painful.  When asked about the last time the nutritionist Ovid Curd met with her, she says "I don't know."  She notes she still needs to look into her living will and make sure that's set up.   Past Medical History  Diagnosis Date  . Heart murmur      as a child  . Chronic bronchitis ([place])   . Anxiety     panic attacks  . Depression   . Headache     onset a few months ago  . Neuropathy ([place])     had it in both hands  . Arthritis   . Fibromyalgia   . Anemia   . Rosacea   . Hypercholesterolemia   . Asthma   . COPD (chronic obstructive pulmonary disease) ([place])   . Seizures ([place])     takes Gabapentin  (last one in 2010); head trauma caused seizures.  . Malignant neoplasm of base of tongue ([place]) 2016    Base of tongue with neck metastases  . Wears glasses   . Pneumonia   . Hypothyroidism   . GERD (gastroesophageal reflux disease)   . Chronic renal disease, stage 3, moderately decreased glomerular filtration rate (GFR) between 30-59 mL/min/1.73 square meter 11/11/2015  . 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     is allergic to bee venom; bacitracin; penicillins; and latex.  Ms. Scorsone had no medications administered during this visit.  Past Surgical History  Procedure Laterality Date  . Wisdom tooth extraction    . Tonsillectomy Left 08/20/2014    Procedure: TONSILLECTOMY;  Surgeon: Ruby Cola, MD;  Location: [facility];  Service: ENT;  Laterality: Left;  . Radical neck dissection Left 08/20/2014    Procedure: RADICAL LEFT NECK DISSECTION ;  Surgeon: Ruby Cola, MD;  Location: [facility];  Service: ENT;  Laterality: Left;  . Tracheostomy tube placement N/A 08/20/2014    Procedure: TRACHEOSTOMY - AWAKE;  Surgeon: Ruby Cola, MD;  Location: [facility];  Service: ENT;  Laterality: N/A;  . Gastrostomy tube  placement    . Multiple extractions with alveoloplasty N/A 09/17/2014    Procedure: Extraction of tooth #'s [phone] with alveoloplasty, mandibular left lingual torus reduction, and gross debridement of remaining teeth.;  Surgeon: Lenn Cal, DDS;  Location: [facility];  Service: Oral Surgery;  Laterality: N/A;  . Portacath placement    . Colonoscopy with propofol N/A 04/15/2015    RMR: colonic polyps removed as described above.,  . Polypectomy N/A 04/15/2015    Procedure: POLYPECTOMY;  Surgeon: Daneil Dolin, MD;  Location: [facility] ORS;  Service: Endoscopy;  Laterality: N/A;  . Esophagogastroduodenoscopy (egd) with propofol N/A 07/19/2015    RMR: poorly postitioned PEG tube. Status post removal and replacement of a small bowel video capsule  . Givens capsule study N/A 07/19/2015    Procedure: GIVENS CAPSULE STUDY;  Surgeon: Daneil Dolin, MD;  Location: [facility];  Service: Endoscopy;  Laterality: N/A;  . Tonsillectomy    . Video bronchoscopy with endobronchial ultrasound N/A 10/07/2015  Procedure: VIDEO BRONCHOSCOPY WITH ENDOBRONCHIAL ULTRASOUND;  Surgeon: Melrose Nakayama, MD;  Location: [facility];  Service: Thoracic;  Laterality: N/A;  . Mediastinotomy Left 10/07/2015    Procedure: LEFT ANTERIOR MEDIASTINOTOMY;  Surgeon: Melrose Nakayama, MD;  Location: [facility];  Service: Thoracic;  Laterality: Left;    Positive for joint pain and weakness.     Achy joints. Positive for loss of appetite and dry mouth   mucositis Positive for sore throat.      Sore throat worse than last time. Positive for nausea 14 point review of systems was performed and is negative except as detailed under history of present illness and above    PHYSICAL EXAMINATION Vitals - 1 value per visit 6/65/9935  SYSTOLIC 701  DIASTOLIC 67  Pulse 83  Temperature 98.4  Respirations 20  Weight (lb) 99.4  Height   BMI 16.05  VISIT REPORT     ECOG PERFORMANCE STATUS: 1 - Symptomatic but completely  ambulatory  GENERAL:alert, no distress,  comfortable, cachetic appearing  SKIN: skin color, texture, turgor are normal, no rashes or significant lesions. Bilateral neck with chronic XRT changes. Acute paronychia, grade 1 to 2, no obvious infection HEAD: Normocephalic, No masses, lesions, tenderness or abnormalities MOUTH: Mucositis evident. EYES: normal, PERRLA, EOMI, Conjunctiva are pink and non-injected EARS: External ears normal OROPHARYNX:lips, buccal mucosa, and tongue normal and mucous membranes are moist minimal erythema  NECK: supple, trachea midline, tracheostomy site well healed. LYMPH:  no palpable lymphadenopathy BREAST:not examined LUNGS: Overall clear HEART: regular rhythm ABDOMEN:non-tender PEG site with erythematous raw skin around "bumper" no obvious infection BACK: Back symmetric, no curvature. EXTREMITIES:less then 2 second capillary refill, no skin discoloration  NEURO: alert & oriented x 3 with fluent speech, gait normal      LABORATORY DATA: I have reviewed his laboratory data listed below Results for ALYANAH, ELLIOTT (MRN [id number]) as of 11/19/2015 20:20  Ref. Range 11/18/2015 11:30  Sodium Latest Ref Range: 135-145 mmol/L 131 (L)  Potassium Latest Ref Range: 3.5-5.1 mmol/L 3.5  Chloride Latest Ref Range: 101-111 mmol/L 91 (L)  CO2 Latest Ref Range: 22-32 mmol/L 30  BUN Latest Ref Range: 6-20 mg/dL 44 (H)  Creatinine Latest Ref Range: 0.44-1.00 mg/dL 1.43 (H)  Calcium Latest Ref Range: 8.9-10.3 mg/dL 8.6 (L)  EGFR (Non-African Amer.) Latest Ref Range: >60 mL/min 42 (L)  EGFR (African American) Latest Ref Range: >60 mL/min 49 (L)  Glucose Latest Ref Range: 65-99 mg/dL 104 (H)  Anion gap Latest Ref Range: 5-15  10  Magnesium Latest Ref Range: 1.7-2.4 mg/dL 1.8  Alkaline Phosphatase Latest Ref Range: 38-126 U/L 145 (H)  Albumin Latest Ref Range: 3.5-5.0 g/dL 3.7  AST Latest Ref Range: 15-41 U/L 22  ALT Latest Ref Range: 14-54 U/L 11 (L)  Total Protein  Latest Ref Range: 6.5-8.1 g/dL 7.2  Total Bilirubin Latest Ref Range: 0.3-1.2 mg/dL 0.6  WBC Latest Ref Range: 4.0-10.5 K/uL 24.2 (H)  RBC Latest Ref Range: 3.87-5.11 MIL/uL 2.64 (L)  Hemoglobin Latest Ref Range: 12.0-15.0 g/dL 8.3 (L)  HCT Latest Ref Range: 36.0-46.0 % 24.6 (L)  MCV Latest Ref Range: 78.0-100.0 fL 93.2  MCH Latest Ref Range: 26.0-34.0 pg 31.4  MCHC Latest Ref Range: 30.0-36.0 g/dL 33.7  RDW Latest Ref Range: 11.5-15.5 % 18.0 (H)  Platelets Latest Ref Range: 150-400 K/uL 263  Neutrophils Latest Units: % 97  Lymphocytes Latest Units: % 1  Monocytes Relative Latest Units: % 2  Eosinophil Latest Units: % 0  Basophil Latest  Units: % 0  NEUT# Latest Ref Range: 1.7-7.7 K/uL 23.5 (H)  Lymphocyte # Latest Ref Range: 0.7-4.0 K/uL 0.2 (L)  Monocyte # Latest Ref Range: 0.1-1.0 K/uL 0.5  Eosinophils Absolute Latest Ref Range: 0.0-0.7 K/uL 0.0  Basophils Absolute Latest Ref Range: 0.0-0.1 K/uL 0.0      RADIOGRAPHIC STUDIES: I have personally reviewed the radiological images as listed and agreed with the findings in the report.   Study Result     CLINICAL DATA: Mediastinal mass.  EXAM: PORTABLE CHEST 1 VIEW  COMPARISON: October 05, 2015.  FINDINGS: Stable cardiomediastinal silhouette. Right internal jugular Port-A-Cath is unchanged in position. No pneumothorax is noted. Right lung is clear. Stable nodule seen in left midlung peripherally. Increased left basilar opacity is no concerning for atelectasis or infiltrate. No significant pleural effusion is noted. Bony thorax is unremarkable.  IMPRESSION: Stable left midlung nodule compared to prior exam. Increased left basilar opacity is noted concerning for atelectasis or infiltrate.   Electronically Signed  By: Marijo Conception, M.D.  On: 10/07/2015 10:42    Study Result     CLINICAL DATA: Mediastinal mass. History of tongue cancer.  EXAM: CHEST 2 VIEW  COMPARISON: Chest x-ray 09/08/2015.  PET 09/15/2015  FINDINGS: Pleural-based nodularity in the left lung base again noted. There is probable mild progression particularly in the left mid lateral chest and in the left lateral base. There is also a nodule along the left cardiac border. Anterior mediastinal mass seen on the prior PET is difficult to see on the chest x-ray but is suggested on the lateral view with increased density in the anterior mediastinum.  Right lung is clear. Negative for pneumonia or heart failure. No effusion. Port-A-Cath tip in the lower SVC  IMPRESSION: Pleural-based soft tissue nodules left mid and lower lung appears slightly more prominent compared with the prior chest x-ray of 09/08/2015. Suspect metastatic disease. Anterior mediastinal mass better seen on prior cross-sectional imaging.   Electronically Signed  By: Franchot Gallo M.D.  On: 10/05/2015 10:24    Study Result     CLINICAL DATA: Subsequent treatment strategy for base of tongue cancer.  EXAM: NUCLEAR MEDICINE PET SKULL BASE TO THIGH  TECHNIQUE: 5.36 mCi F-18 FDG was injected intravenously. Full-ring PET imaging was performed from the skull base to thigh after the radiotracer. CT data was obtained and used for attenuation correction and anatomic localization.  FASTING BLOOD GLUCOSE: Value: 90 mg/dl  COMPARISON: CT chest dated 06/08/2015. PET-CT dated 02/22/2015.  FINDINGS: NECK  No hypermetabolic lymph nodes in the neck.  CHEST  2.2 x 4.2 cm soft tissue mass along the anterior aspect of the heart/medial aspect of the anterior left upper lobe (series 4/image 65), new from recent prior CT, max SUV 24.0.  Additional 4.7 x 1.7 cm soft tissue lesion along the left heart border/medial aspect of the left upper lobe (series 4/image 79), new from recent prior CT, max SUV 14.9.  These are both worrisome for pleural-based or less likely pericardial metastases.  Additional residual soft tissue  nodularity along the left fissure measuring 1.4 x 1.7 cm (series 4/ image 76), max SUV 11.3. Associated cavitary masslike opacity with surrounding ground-glass opacity in the left upper and lower lobes has essentially resolved, suggesting an infectious etiology. As such, it is unclear whether this is related to residual infection or possibly additional pleural-based tumor.  Additional scattered thin-walled cavitary lesions with scarring in the lingula and left lower lobe, related to prior infection. No pleural effusion or pneumothorax.  The heart is normal in size. No pericardial effusion. Right chest port terminates at the cavoatrial junction.  Hypermetabolic thoracic lymphadenopathy, including:  --6 mm short axis prevascular node (series 4/ image 50), max SUV 6.0  --7 mm short axis high right paratracheal node (series 4/ image 48), max SUV 4.8  --12 mm short axis subcarinal node (series 4/image 57), max SUV 9.9  --Hypermetabolic focus in the left hilum, max SUV 8.4  --Mild hypermetabolism in the right hilum, max SUV 4.7  ABDOMEN/PELVIS  No abnormal hypermetabolic activity within the liver, pancreas, adrenal glands, or spleen.  Gastrostomy in satisfactory position. Atherosclerotic calcifications of the abdominal aorta and branch vessels. 3 mm nonobstructing right upper pole renal calculus.  No hypermetabolic lymph nodes in the abdomen or pelvis.  SKELETON  No focal hypermetabolic activity to suggest skeletal metastasis.  IMPRESSION: 2.2 x 4.2 cm soft tissue mass along the anterior aspect of the heart and 1.7 x 4.7 cm soft tissue mass along the left heart border, suspicious for pleural-based or less likely pericardial metastases, new.  Prior left upper and lower lobe patchy opacity has essentially resolved, likely infectious. Residual 1.4 x 1.7 cm pleural-based nodularity along the left fissure may reflect additional tumor versus residual  infection.  Hypermetabolic thoracic lymphadenopathy, suspicious for nodal metastases, as above.   Electronically Signed  By: Julian Hy M.D.  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  Her polypharmacy is a problem. She was upset that I would not write some of the medications she requested. I suggested that we may need to cut out some of her medications, she may feel better on fewer, no more medications. 2) This may be the last cycle of 5-FU based therapy she can tolerate. In spite of a dose reduction she still has mucositis 3) Her weight loss is quite concerning. Since finishing XRT however she has never eaten a large amount by mouth. I am going to have her meet with Ovid Curd today as I cannot tell how many cans she is doing daily 4) We discussed how to address the nail changes from erbitux especially around the periungual regions 5) She was instructed not to use so many topicals around the PEG site. If no improvement over the next week, will refer to IR or GI. 6) Will bring her back next week for weight recheck, based on how she is doing may hold Erbitux, may also consider re-imaging prior to scheduled cycle #3 as previously noted, may consider only moving forward with single agent Erbitux given her ongoing weight loss. Imaging will help sort out if her current weight loss is therapy related vs, progression.  We have had frank discussions about end of life.  She is working on her living  will. We will address DNR status at follow-up. All questions were answered. The patient knows to call the clinic with any problems, questions or concerns. We can certainly see the patient much sooner if necessary.   This document serves as a record of services personally performed by Ancil Linsey, MD. It  was created on her behalf by Toni Amend, a trained medical scribe. The creation of this record is based on the scribe's personal observations and the provider's statements to them. This document has been checked and approved by the attending provider.  I have reviewed the above documentation for accuracy and completeness, and I agree with the above.  Kelby Fam.  MD

## 2015-11-18 NOTE — Progress Notes (Signed)
1400:  Tolerated infusion w/o adverse reaction.  A&Ox4, in no distress. VSS.  Aranesp 80 mcg injection given as ordered. Discharged ambulatory in c/o family for transport home.

## 2015-11-18 NOTE — Patient Instructions (Signed)
[facility] Discharge Instructions for Patients Receiving Chemotherapy   Beginning January 23rd 2017 lab work for the [facility] will be done in the  Main lab at [facility] on 1st floor. If you have a lab appointment with the [facility] please come in thru the  Main Entrance and check in at the main information desk   Today you received the following chemotherapy agents:  Erbitux  If you develop nausea and vomiting, or diarrhea that is not controlled by your medication, call the clinic.  The clinic phone number is [phone]. Office hours are Monday-Friday 8:30am-5:00pm.  BELOW ARE SYMPTOMS THAT SHOULD BE REPORTED IMMEDIATELY:  *FEVER GREATER THAN 101.0 F  *CHILLS WITH OR WITHOUT FEVER  NAUSEA AND VOMITING THAT IS NOT CONTROLLED WITH YOUR NAUSEA MEDICATION  *UNUSUAL SHORTNESS OF BREATH  *UNUSUAL BRUISING OR BLEEDING  TENDERNESS IN MOUTH AND THROAT WITH OR WITHOUT PRESENCE OF ULCERS  *URINARY PROBLEMS  *BOWEL PROBLEMS  UNUSUAL RASH Items with * indicate a potential emergency and should be followed up as soon as possible. If you have an emergency after office hours please contact your primary care physician or go to the nearest emergency department.  Please call the clinic during office hours if you have any questions or concerns.   You may also contact the Patient Navigator at [phone] should you have any questions or need assistance in obtaining follow up care.      Resources For Cancer Patients and their Caregivers ? American Cancer Society: Can assist with transportation, wigs, general needs, runs Look Good Feel Better.        [phone] ? Cancer Care: Provides financial assistance, online support groups, medication/co-pay assistance.  1-800-813-HOPE [phone]) ? [place] Assists [place] cancer patients and their families through emotional , educational and financial support.   7127006584 ? [place] DSS Where to apply for food stamps, Medicaid and utility assistance. 612-101-6033 ? RCATS: Transportation to medical appointments. [phone] ? Social Security Administration: May apply for disability if have a Stage IV cancer. [phone] [phone] ? [employer], Disability and Transit Services: Assists with nutrition, care and transit needs. [phone]

## 2015-11-19 ENCOUNTER — Encounter (HOSPITAL_COMMUNITY): Payer: Self-pay | Admitting: Hematology & Oncology

## 2015-11-19 ENCOUNTER — Inpatient Hospital Stay (HOSPITAL_COMMUNITY): Payer: Self-pay

## 2015-11-19 ENCOUNTER — Ambulatory Visit (HOSPITAL_COMMUNITY): Payer: Self-pay

## 2015-11-19 DIAGNOSIS — C7802 Secondary malignant neoplasm of left lung: Secondary | ICD-10-CM | POA: Insufficient documentation

## 2015-11-21 ENCOUNTER — Other Ambulatory Visit: Payer: Self-pay

## 2015-11-21 ENCOUNTER — Inpatient Hospital Stay (HOSPITAL_COMMUNITY)
Admission: EM | Admit: 2015-11-21 | Discharge: 2015-11-25 | DRG: 871 | Disposition: E | Payer: Medicaid Other | Attending: Pulmonary Disease | Admitting: Pulmonary Disease

## 2015-11-21 ENCOUNTER — Emergency Department (HOSPITAL_COMMUNITY): Payer: Medicaid Other

## 2015-11-21 ENCOUNTER — Inpatient Hospital Stay (HOSPITAL_COMMUNITY): Payer: Medicaid Other

## 2015-11-21 ENCOUNTER — Other Ambulatory Visit (HOSPITAL_COMMUNITY): Payer: Self-pay

## 2015-11-21 ENCOUNTER — Encounter (HOSPITAL_COMMUNITY): Payer: Self-pay | Admitting: Cardiology

## 2015-11-21 DIAGNOSIS — R6521 Severe sepsis with septic shock: Secondary | ICD-10-CM | POA: Diagnosis present

## 2015-11-21 DIAGNOSIS — Z7951 Long term (current) use of inhaled steroids: Secondary | ICD-10-CM | POA: Diagnosis not present

## 2015-11-21 DIAGNOSIS — J9601 Acute respiratory failure with hypoxia: Secondary | ICD-10-CM | POA: Diagnosis not present

## 2015-11-21 DIAGNOSIS — E7800 Pure hypercholesterolemia, unspecified: Secondary | ICD-10-CM | POA: Diagnosis present

## 2015-11-21 DIAGNOSIS — T451X5A Adverse effect of antineoplastic and immunosuppressive drugs, initial encounter: Secondary | ICD-10-CM | POA: Diagnosis present

## 2015-11-21 DIAGNOSIS — M797 Fibromyalgia: Secondary | ICD-10-CM | POA: Diagnosis present

## 2015-11-21 DIAGNOSIS — C029 Malignant neoplasm of tongue, unspecified: Secondary | ICD-10-CM | POA: Diagnosis present

## 2015-11-21 DIAGNOSIS — Z79899 Other long term (current) drug therapy: Secondary | ICD-10-CM

## 2015-11-21 DIAGNOSIS — C7802 Secondary malignant neoplasm of left lung: Secondary | ICD-10-CM

## 2015-11-21 DIAGNOSIS — R627 Adult failure to thrive: Secondary | ICD-10-CM | POA: Diagnosis present

## 2015-11-21 DIAGNOSIS — D72819 Decreased white blood cell count, unspecified: Secondary | ICD-10-CM

## 2015-11-21 DIAGNOSIS — Z880 Allergy status to penicillin: Secondary | ICD-10-CM

## 2015-11-21 DIAGNOSIS — J9621 Acute and chronic respiratory failure with hypoxia: Secondary | ICD-10-CM | POA: Diagnosis present

## 2015-11-21 DIAGNOSIS — E039 Hypothyroidism, unspecified: Secondary | ICD-10-CM | POA: Diagnosis present

## 2015-11-21 DIAGNOSIS — Z8249 Family history of ischemic heart disease and other diseases of the circulatory system: Secondary | ICD-10-CM

## 2015-11-21 DIAGNOSIS — J45909 Unspecified asthma, uncomplicated: Secondary | ICD-10-CM | POA: Diagnosis present

## 2015-11-21 DIAGNOSIS — Z91040 Latex allergy status: Secondary | ICD-10-CM | POA: Diagnosis not present

## 2015-11-21 DIAGNOSIS — Z681 Body mass index (BMI) 19 or less, adult: Secondary | ICD-10-CM | POA: Diagnosis not present

## 2015-11-21 DIAGNOSIS — Z01818 Encounter for other preprocedural examination: Secondary | ICD-10-CM

## 2015-11-21 DIAGNOSIS — Y95 Nosocomial condition: Secondary | ICD-10-CM | POA: Diagnosis present

## 2015-11-21 DIAGNOSIS — D701 Agranulocytosis secondary to cancer chemotherapy: Secondary | ICD-10-CM | POA: Diagnosis present

## 2015-11-21 DIAGNOSIS — K219 Gastro-esophageal reflux disease without esophagitis: Secondary | ICD-10-CM | POA: Diagnosis present

## 2015-11-21 DIAGNOSIS — J15212 Pneumonia due to Methicillin resistant Staphylococcus aureus: Secondary | ICD-10-CM | POA: Diagnosis present

## 2015-11-21 DIAGNOSIS — Z881 Allergy status to other antibiotic agents status: Secondary | ICD-10-CM | POA: Diagnosis not present

## 2015-11-21 DIAGNOSIS — R64 Cachexia: Secondary | ICD-10-CM | POA: Diagnosis present

## 2015-11-21 DIAGNOSIS — D6959 Other secondary thrombocytopenia: Secondary | ICD-10-CM | POA: Diagnosis present

## 2015-11-21 DIAGNOSIS — J9620 Acute and chronic respiratory failure, unspecified whether with hypoxia or hypercapnia: Secondary | ICD-10-CM | POA: Diagnosis present

## 2015-11-21 DIAGNOSIS — D611 Drug-induced aplastic anemia: Secondary | ICD-10-CM | POA: Diagnosis present

## 2015-11-21 DIAGNOSIS — N179 Acute kidney failure, unspecified: Secondary | ICD-10-CM | POA: Diagnosis present

## 2015-11-21 DIAGNOSIS — Z923 Personal history of irradiation: Secondary | ICD-10-CM

## 2015-11-21 DIAGNOSIS — D709 Neutropenia, unspecified: Secondary | ICD-10-CM | POA: Diagnosis present

## 2015-11-21 DIAGNOSIS — C140 Malignant neoplasm of pharynx, unspecified: Secondary | ICD-10-CM | POA: Diagnosis present

## 2015-11-21 DIAGNOSIS — N183 Chronic kidney disease, stage 3 unspecified: Secondary | ICD-10-CM | POA: Diagnosis present

## 2015-11-21 DIAGNOSIS — Z66 Do not resuscitate: Secondary | ICD-10-CM | POA: Diagnosis present

## 2015-11-21 DIAGNOSIS — R5081 Fever presenting with conditions classified elsewhere: Secondary | ICD-10-CM | POA: Diagnosis present

## 2015-11-21 DIAGNOSIS — Z91030 Bee allergy status: Secondary | ICD-10-CM

## 2015-11-21 DIAGNOSIS — Z515 Encounter for palliative care: Secondary | ICD-10-CM | POA: Diagnosis present

## 2015-11-21 DIAGNOSIS — Z9221 Personal history of antineoplastic chemotherapy: Secondary | ICD-10-CM | POA: Diagnosis not present

## 2015-11-21 DIAGNOSIS — J189 Pneumonia, unspecified organism: Secondary | ICD-10-CM

## 2015-11-21 DIAGNOSIS — F418 Other specified anxiety disorders: Secondary | ICD-10-CM | POA: Diagnosis present

## 2015-11-21 DIAGNOSIS — K1230 Oral mucositis (ulcerative), unspecified: Secondary | ICD-10-CM | POA: Diagnosis present

## 2015-11-21 DIAGNOSIS — R569 Unspecified convulsions: Secondary | ICD-10-CM | POA: Diagnosis present

## 2015-11-21 DIAGNOSIS — R4182 Altered mental status, unspecified: Secondary | ICD-10-CM | POA: Diagnosis present

## 2015-11-21 DIAGNOSIS — A419 Sepsis, unspecified organism: Secondary | ICD-10-CM | POA: Diagnosis present

## 2015-11-21 DIAGNOSIS — J440 Chronic obstructive pulmonary disease with acute lower respiratory infection: Secondary | ICD-10-CM | POA: Diagnosis present

## 2015-11-21 DIAGNOSIS — D631 Anemia in chronic kidney disease: Secondary | ICD-10-CM | POA: Diagnosis present

## 2015-11-21 DIAGNOSIS — D649 Anemia, unspecified: Secondary | ICD-10-CM

## 2015-11-21 LAB — COMPREHENSIVE METABOLIC PANEL
ALBUMIN: 2.5 g/dL — AB (ref 3.5–5.0)
ALK PHOS: 38 U/L (ref 38–126)
ALT: 12 U/L — AB (ref 14–54)
AST: 43 U/L — ABNORMAL HIGH (ref 15–41)
Anion gap: 17 — ABNORMAL HIGH (ref 5–15)
BUN: 55 mg/dL — ABNORMAL HIGH (ref 6–20)
CALCIUM: 7.2 mg/dL — AB (ref 8.9–10.3)
CHLORIDE: 95 mmol/L — AB (ref 101–111)
CO2: 21 mmol/L — AB (ref 22–32)
CREATININE: 3.07 mg/dL — AB (ref 0.44–1.00)
GFR calc Af Amer: 19 mL/min — ABNORMAL LOW (ref >60)
GFR calc non Af Amer: 17 mL/min — ABNORMAL LOW (ref >60)
GLUCOSE: 83 mg/dL (ref 65–99)
Potassium: 3.1 mmol/L — ABNORMAL LOW (ref 3.5–5.1)
SODIUM: 133 mmol/L — AB (ref 135–145)
Total Bilirubin: 0.7 mg/dL (ref 0.3–1.2)
Total Protein: 5.9 g/dL — ABNORMAL LOW (ref 6.5–8.1)

## 2015-11-21 LAB — BLOOD GAS, ARTERIAL
ACID-BASE DEFICIT: 4.8 mmol/L — AB (ref 0.0–2.0)
Acid-base deficit: 5 mmol/L — ABNORMAL HIGH (ref 0.0–2.0)
BICARBONATE: 20.4 meq/L (ref 20.0–24.0)
Bicarbonate: 20.3 mEq/L (ref 20.0–24.0)
DELIVERY SYSTEMS: POSITIVE
DRAWN BY: 330991
DRAWN BY: 222231
Expiratory PAP: 5
FIO2: 80
FIO2: 100
Inspiratory PAP: 13
LHR: 20 {breaths}/min
LHR: 24 {breaths}/min
MECHVT: 500 mL
O2 Saturation: 92.9 %
O2 Saturation: 98.7 %
PEEP: 5 cmH2O
PH ART: 7.349 — AB (ref 7.350–7.450)
PO2 ART: 141 mmHg — AB (ref 80.0–100.0)
Patient temperature: 101.4
pCO2 arterial: 36.9 mmHg (ref 35.0–45.0)
pCO2 arterial: 32.6 mmHg — ABNORMAL LOW (ref 35.0–45.0)
pH, Arterial: 7.386 (ref 7.350–7.450)
pO2, Arterial: 78.1 mmHg — ABNORMAL LOW (ref 80.0–100.0)

## 2015-11-21 LAB — CBC WITH DIFFERENTIAL/PLATELET
BASOS ABS: 0 10*3/uL (ref 0.0–0.1)
Band Neutrophils: 0 %
Basophils Relative: 0 %
EOS ABS: 0 10*3/uL (ref 0.0–0.7)
EOS PCT: 1 %
HEMATOCRIT: 21.7 % — AB (ref 36.0–46.0)
HEMOGLOBIN: 7.4 g/dL — AB (ref 12.0–15.0)
LYMPHS ABS: 0.1 10*3/uL — AB (ref 0.7–4.0)
LYMPHS PCT: 9 %
MCH: 30.8 pg (ref 26.0–34.0)
MCHC: 34.1 g/dL (ref 30.0–36.0)
MCV: 90.4 fL (ref 78.0–100.0)
MONOS PCT: 32 %
Monocytes Absolute: 0.3 10*3/uL (ref 0.1–1.0)
NEUTROS ABS: 0.5 10*3/uL — AB (ref 1.7–7.7)
NEUTROS PCT: 56 %
Platelets: 134 10*3/uL — ABNORMAL LOW (ref 150–400)
RBC: 2.4 MIL/uL — ABNORMAL LOW (ref 3.87–5.11)
RDW: 18 % — AB (ref 11.5–15.5)
WBC: 0.9 10*3/uL — CL (ref 4.0–10.5)

## 2015-11-21 LAB — URINE MICROSCOPIC-ADD ON

## 2015-11-21 LAB — I-STAT CG4 LACTIC ACID, ED
LACTIC ACID, VENOUS: 6.43 mmol/L — AB (ref 0.5–2.0)
Lactic Acid, Venous: 6.76 mmol/L (ref 0.5–2.0)
Lactic Acid, Venous: 3.66 mmol/L (ref 0.5–2.0)

## 2015-11-21 LAB — URINALYSIS, ROUTINE W REFLEX MICROSCOPIC
BILIRUBIN URINE: NEGATIVE
GLUCOSE, UA: NEGATIVE mg/dL
KETONES UR: NEGATIVE mg/dL
Leukocytes, UA: NEGATIVE
Nitrite: NEGATIVE
PH: 5.5 (ref 5.0–8.0)
Protein, ur: 100 mg/dL — AB
SPECIFIC GRAVITY, URINE: 1.015 (ref 1.005–1.030)

## 2015-11-21 LAB — PREPARE RBC (CROSSMATCH)

## 2015-11-21 MED ORDER — SODIUM CHLORIDE 0.9 % IV SOLN
1000.0000 mL | Freq: Once | INTRAVENOUS | Status: AC
  Administered 2015-11-21: 1000 mL via INTRAVENOUS

## 2015-11-21 MED ORDER — ALBUTEROL SULFATE (2.5 MG/3ML) 0.083% IN NEBU: 2.5000 mg | INHALATION_SOLUTION | Freq: Four times a day (QID) | RESPIRATORY_TRACT | Status: DC

## 2015-11-21 MED ORDER — HEPARIN SODIUM (PORCINE) 5000 UNIT/ML IJ SOLN
5000.0000 [IU] | Freq: Three times a day (TID) | INTRAMUSCULAR | Status: DC
Start: 2015-11-21 — End: 2015-11-22
  Administered 2015-11-22 (×2): 5000 [IU] via SUBCUTANEOUS
  Filled 2015-11-21 (×2): qty 1

## 2015-11-21 MED ORDER — MIDAZOLAM HCL 50 MG/10ML IJ SOLN
INTRAMUSCULAR | Status: AC
  Filled 2015-11-21: qty 1

## 2015-11-21 MED ORDER — LEVALBUTEROL HCL 0.63 MG/3ML IN NEBU
INHALATION_SOLUTION | RESPIRATORY_TRACT | Status: AC
  Administered 2015-11-21: 0.63 mg
  Filled 2015-11-21: qty 3

## 2015-11-21 MED ORDER — PEGFILGRASTIM INJECTION 6 MG/0.6ML ~~LOC~~
6.0000 mg | PREFILLED_SYRINGE | Freq: Once | SUBCUTANEOUS | Status: DC
  Filled 2015-11-21: qty 0.6

## 2015-11-21 MED ORDER — VITAL HIGH PROTEIN PO LIQD
1000.0000 mL | ORAL | Status: DC
  Administered 2015-11-22: 1000 mL
  Administered 2015-11-22: 07:00:00
  Filled 2015-11-21 (×2): qty 1000

## 2015-11-21 MED ORDER — PANTOPRAZOLE SODIUM 40 MG IV SOLR
40.0000 mg | Freq: Every day | INTRAVENOUS | Status: DC
  Administered 2015-11-22: 40 mg via INTRAVENOUS
  Filled 2015-11-21: qty 40

## 2015-11-21 MED ORDER — LEVOFLOXACIN IN D5W 750 MG/150ML IV SOLN
750.0000 mg | Freq: Once | INTRAVENOUS | Status: AC
  Administered 2015-11-21: 750 mg via INTRAVENOUS
  Filled 2015-11-21: qty 150

## 2015-11-21 MED ORDER — SODIUM CHLORIDE 0.9 % IV SOLN
1.0000 mg/h | INTRAVENOUS | Status: DC
  Administered 2015-11-21 (×3): 1 mg/h via INTRAVENOUS
  Filled 2015-11-21: qty 10

## 2015-11-21 MED ORDER — SODIUM CHLORIDE 0.9% FLUSH
3.0000 mL | Freq: Two times a day (BID) | INTRAVENOUS | Status: DC
  Administered 2015-11-22: 3 mL via INTRAVENOUS

## 2015-11-21 MED ORDER — ALBUTEROL SULFATE (2.5 MG/3ML) 0.083% IN NEBU: 2.5000 mg | INHALATION_SOLUTION | RESPIRATORY_TRACT | Status: DC | PRN

## 2015-11-21 MED ORDER — ETOMIDATE 2 MG/ML IV SOLN
INTRAVENOUS | Status: AC | PRN
  Administered 2015-11-21: 20 mg via INTRAVENOUS

## 2015-11-21 MED ORDER — SUCCINYLCHOLINE CHLORIDE 20 MG/ML IJ SOLN
INTRAMUSCULAR | Status: AC | PRN
  Administered 2015-11-21: 100 mg via INTRAVENOUS

## 2015-11-21 MED ORDER — SODIUM CHLORIDE 0.9 % IV SOLN
10.0000 mL/h | Freq: Once | INTRAVENOUS | Status: AC
  Administered 2015-11-21: 10 mL/h via INTRAVENOUS

## 2015-11-21 MED ORDER — ACETAMINOPHEN 650 MG RE SUPP
650.0000 mg | Freq: Once | RECTAL | Status: AC
  Administered 2015-11-21: 650 mg via RECTAL
  Filled 2015-11-21: qty 1

## 2015-11-21 MED ORDER — FLUCONAZOLE IN SODIUM CHLORIDE 100-0.9 MG/50ML-% IV SOLN
100.0000 mg | INTRAVENOUS | Status: DC
  Administered 2015-11-22: 100 mg via INTRAVENOUS
  Filled 2015-11-21 (×2): qty 50

## 2015-11-21 MED ORDER — IPRATROPIUM-ALBUTEROL 0.5-2.5 (3) MG/3ML IN SOLN
RESPIRATORY_TRACT | Status: AC
  Administered 2015-11-21: 18:00:00
  Filled 2015-11-21: qty 3

## 2015-11-21 MED ORDER — SODIUM CHLORIDE 0.9 % IV BOLUS (SEPSIS)
500.0000 mL | Freq: Once | INTRAVENOUS | Status: AC
  Administered 2015-11-21: 18:00:00 via INTRAVENOUS

## 2015-11-21 MED ORDER — VANCOMYCIN HCL IN DEXTROSE 1-5 GM/200ML-% IV SOLN
1000.0000 mg | Freq: Once | INTRAVENOUS | Status: AC
  Administered 2015-11-21: 1000 mg via INTRAVENOUS
  Filled 2015-11-21: qty 200

## 2015-11-21 MED ORDER — PROPOFOL 1000 MG/100ML IV EMUL
5.0000 ug/kg/min | Freq: Once | INTRAVENOUS | Status: AC
  Administered 2015-11-21: 5 ug/kg/min via INTRAVENOUS

## 2015-11-21 MED ORDER — LEVALBUTEROL HCL 0.63 MG/3ML IN NEBU
0.6300 mg | INHALATION_SOLUTION | Freq: Four times a day (QID) | RESPIRATORY_TRACT | Status: DC
  Administered 2015-11-22 (×3): 0.63 mg via RESPIRATORY_TRACT
  Filled 2015-11-21 (×3): qty 3

## 2015-11-21 MED ORDER — FENTANYL CITRATE (PF) 2500 MCG/50ML IJ SOLN
INTRAMUSCULAR | Status: AC
  Filled 2015-11-21: qty 50

## 2015-11-21 MED ORDER — DEXTROSE-NACL 5-0.9 % IV SOLN
INTRAVENOUS | Status: DC
  Administered 2015-11-22: via INTRAVENOUS

## 2015-11-21 MED ORDER — DEXTROSE 5 % IV SOLN
2.0000 g | Freq: Once | INTRAVENOUS | Status: AC
  Administered 2015-11-21: 2 g via INTRAVENOUS
  Filled 2015-11-21: qty 2

## 2015-11-21 MED ORDER — FLUCONAZOLE IN SODIUM CHLORIDE 200-0.9 MG/100ML-% IV SOLN
INTRAVENOUS | Status: AC
  Filled 2015-11-21: qty 100

## 2015-11-21 MED ORDER — SODIUM CHLORIDE 0.9 % IV BOLUS (SEPSIS)
1000.0000 mL | Freq: Once | INTRAVENOUS | Status: AC
  Administered 2015-11-21: 1000 mL via INTRAVENOUS

## 2015-11-21 MED ORDER — PRO-STAT SUGAR FREE PO LIQD
30.0000 mL | Freq: Two times a day (BID) | ORAL | Status: DC
  Administered 2015-11-22: 30 mL
  Filled 2015-11-21: qty 30

## 2015-11-21 MED ORDER — CHLORHEXIDINE GLUCONATE 0.12% ORAL RINSE (MEDLINE KIT)
15.0000 mL | Freq: Two times a day (BID) | OROMUCOSAL | Status: DC
  Administered 2015-11-22 (×2): 15 mL via OROMUCOSAL

## 2015-11-21 MED ORDER — SODIUM CHLORIDE 0.9 % IV SOLN
10.0000 ug/h | INTRAVENOUS | Status: DC
  Administered 2015-11-22 (×2): 400 ug/h via INTRAVENOUS
  Filled 2015-11-21 (×2): qty 50

## 2015-11-21 MED ORDER — ANTISEPTIC ORAL RINSE SOLUTION (CORINZ)
7.0000 mL | OROMUCOSAL | Status: DC
  Administered 2015-11-22 (×7): 7 mL via OROMUCOSAL

## 2015-11-21 MED ORDER — PROPOFOL 1000 MG/100ML IV EMUL
INTRAVENOUS | Status: AC
  Filled 2015-11-21: qty 100

## 2015-11-21 MED ORDER — PROPOFOL 10 MG/ML IV BOLUS
20.0000 mg | Freq: Once | INTRAVENOUS | Status: AC
  Administered 2015-11-21: 20 mg via INTRAVENOUS

## 2015-11-21 NOTE — ED Notes (Signed)
MD notified of Critical WBC count.

## 2015-11-21 NOTE — ED Notes (Signed)
Patient with 55% oxygen saturations on 3 liters [place]. NRB mask applied, oxygen 85% after several minutes. MD aware and at bedside.

## 2015-11-21 NOTE — Progress Notes (Addendum)
Pharmacy Antibiotic Note  Shelby Larson is a 50 y.o. female admitted on 11/04/2015 with sepsis.  Pharmacy has been consulted for vancomycin, levaquin and aztreonam dosing.  Initial doses ordered in the ED  Plan: Cont aztreonam 1gm IV q8 hours Cont levaquin 500 mg IV q48 hours Cont vancomycin 500 mg IV q48 hours F/u renal function, cultures and clinical course  Weight: 99 lb (44.906 kg)  Temp (24hrs), Avg:101.4 F (38.6 C), Min:101.4 F (38.6 C), Max:101.4 F (38.6 C)   Recent Labs Lab 11/18/15 1130 11/13/2015 1632 10/29/2015 1638 11/04/2015 1649  WBC 24.2* PENDING  --   --   CREATININE 1.43*  --   --   --   LATICACIDVEN  --   --  6.76* 6.43*    Estimated Creatinine Clearance: 33.7 mL/min (by C-G formula based on Cr of 1.43).    Allergies  Allergen Reactions  . Bee Venom Swelling  . Bacitracin Other (See Comments)    unknown  . Penicillins Other (See Comments)    Has patient had a PCN reaction causing immediate rash, facial/tongue/throat swelling, SOB or lightheadedness with hypotension: unknown, childhood reaction Has patient had a PCN reaction causing severe rash involving mucus membranes or skin necrosis: unknown Has patient had a PCN reaction that required hospitalization unknown Has patient had a PCN reaction occurring within the last 10 years: unknown If all of the above answers are "NO", then may proceed with Cephalosporin use.  . Latex Swelling and Rash    Antimicrobials this admission: levaquin 5/28 >>  vancomycin 5/28 >>  Aztreonam 5/28>>   Thank you for allowing pharmacy to be a part of this patient's care.  Excell Seltzer P[place]et 10/27/2015 5:11 PM

## 2015-11-21 NOTE — H&P (Signed)
History and Physical    Shelby Larson:[id number] DOB: 09-26-1965 DOA: 10/31/2015  Referring MD/NP/PA: Vanetta Mulders, MD PCP: Juliette Alcide, MD  Outpatient Specialists: Hematology and Oncology; Allene Pyo, MD Radiation Oncology; Lonie Peak, MD Gastroenterology; Corbin Ade, MD  Patient coming from: home  Chief Complaint: Altered mental status  HPI: Shelby Larson is a 50 y.o. female with medical history significant of head/neck cancer, diagnosed in February of 2016. She is followed by the hematology clinic here at [facility], and was recently seen there on May 25. 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 Patient sees Dr. Galen Manila of Hematology/Oncology and discussions have been started about DNR or living will situations, though no final decision has been made. Patient reports no acute issues until shortness of breath and subjective fevers onset yesterday. Her shortness of breath significantly worsened today.   Upon arriving in the ED, the patient met sepsis criteria, so sepsis order set was started. She was given 1.5L fluid, with improvement in her blood pressures. Though her ABG was acceptable in the ER, it was clear that her breathing was laborous at RR of 60, and eventual respiratory failure impending, she was intubated.   Labs showed WBC of 0.9,  Hb 7.4 g per dL, Platelet count was 258N, Cr of 3.07 (prior 1.43).  CXR showed improvement of the left lobe, but the right lobe showed multilobar PNAs.   Hospitalist was asked to refer for admission for neutropenic fever, AKI on CKD, respiratory failure, and multilobar PNA with sepsis.    Review of Systems: As per HPI otherwise 10 point review of systems negative.   Past Medical History  Diagnosis Date  . Heart murmur     as a child  . Chronic bronchitis (HCC)   . Anxiety     panic attacks  . Depression   . Headache     onset a few months  ago  . Neuropathy (HCC)     had it in both hands  . Arthritis   . Fibromyalgia   . Anemia   . Rosacea   . Hypercholesterolemia   . Asthma   . COPD (chronic obstructive pulmonary disease) (HCC)   . Seizures (HCC)     takes Gabapentin  (last one in 2010); head trauma caused seizures.  . 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 Wears glasses   . Pneumonia   . Hypothyroidism   . GERD (gastroesophageal reflux disease)   . Chronic renal disease, stage 3, moderately decreased glomerular filtration rate (GFR) between 30-59 mL/min/1.73 square meter 11/11/2015  . Anemia of chronic renal failure, stage 3 (moderate) 11/11/2015    Past Surgical History  Procedure Laterality Date  . Wisdom tooth extraction    . Tonsillectomy Left 08/20/2014    Procedure: TONSILLECTOMY;  Surgeon: Melvenia Beam, MD;  Location: [facility] OR;  Service: ENT;  Laterality: Left;  . Radical neck dissection Left 08/20/2014    Procedure: RADICAL LEFT NECK DISSECTION ;  Surgeon: Melvenia Beam, MD;  Location: [facility] OR;  Service: ENT;  Laterality: Left;  . Tracheostomy tube placement N/A 08/20/2014    Procedure: TRACHEOSTOMY - AWAKE;  Surgeon: Melvenia Beam, MD;  Location: [facility] OR;  Service: ENT;  Laterality: N/A;  . Gastrostomy tube placement    . Multiple extractions with alveoloplasty N/A 09/17/2014    Procedure: Extraction of tooth #'s  [phone] with alveoloplasty, mandibular left lingual torus reduction, and gross debridement of remaining teeth.;  Surgeon: Lenn Cal, DDS;  Location: [place];  Service: Oral Surgery;  Laterality: N/A;  . Portacath placement    . Colonoscopy with propofol N/A 04/15/2015    RMR: colonic polyps removed as described above.,  . Polypectomy N/A 04/15/2015    Procedure: POLYPECTOMY;  Surgeon: Daneil Dolin, MD;  Location: [facility] ORS;  Service: Endoscopy;  Laterality: N/A;  . Esophagogastroduodenoscopy (egd) with propofol N/A 07/19/2015    RMR: poorly  postitioned PEG tube. Status post removal and replacement of a small bowel video capsule  . Givens capsule study N/A 07/19/2015    Procedure: GIVENS CAPSULE STUDY;  Surgeon: Daneil Dolin, MD;  Location: [facility] ENDO SUITE;  Service: Endoscopy;  Laterality: N/A;  . Tonsillectomy    . Video bronchoscopy with endobronchial ultrasound N/A 10/07/2015    Procedure: VIDEO BRONCHOSCOPY WITH ENDOBRONCHIAL ULTRASOUND;  Surgeon: Melrose Nakayama, MD;  Location: [place];  Service: Thoracic;  Laterality: N/A;  . Mediastinotomy Left 10/07/2015    Procedure: LEFT ANTERIOR MEDIASTINOTOMY;  Surgeon: Melrose Nakayama, MD;  Location: [place];  Service: Thoracic;  Laterality: Left;     reports that she has been smoking Cigarettes.  She has a 10 pack-year smoking history. She has never used smokeless tobacco. She reports that she drinks alcohol. She reports that she does not use illicit drugs.  Allergies  Allergen Reactions  . Bee Venom Swelling  . Bacitracin Other (See Comments)    unknown  . Penicillins Other (See Comments)    Has patient had a PCN reaction causing immediate rash, facial/tongue/throat swelling, SOB or lightheadedness with hypotension: unknown, childhood reaction Has patient had a PCN reaction causing severe rash involving mucus membranes or skin necrosis: unknown Has patient had a PCN reaction that required hospitalization unknown Has patient had a PCN reaction occurring within the last 10 years: unknown If all of the above answers are "NO", then may proceed with Cephalosporin use.  . Latex Swelling and Rash    Family History  Problem Relation Age of Onset  . Neuropathy Mother   . Hypertension Mother   . Alcoholism Father   . Alcoholism Sister     Prior to Admission medications   Medication Sig Start Date End Date Taking? Authorizing Provider  BIOTIN 5000 PO Take 10,000 mcg by mouth daily.     Historical Provider, MD  cevimeline (EVOXAC) 30 MG capsule Take 1 capsule (30 mg total) by  mouth 3 (three) times daily. 10/19/15   Patrici Ranks, MD  clotrimazole-betamethasone (LOTRISONE) cream Apply 1 application topically 2 (two) times daily. Apply to affected area BID 11/11/15   Baird Cancer, PA-C  cyclobenzaprine (FLEXERIL) 10 MG tablet Take 1 tablet (10 mg total) by mouth 3 (three) times daily as needed for muscle spasms. 10/06/15   Baird Cancer, PA-C  Diphenhyd-Hydrocort-Nystatin (FIRST-DUKES MOUTHWASH) SUSP Swish and swallow 4 times a day as needed for mouth pain 10/28/15   Patrici Ranks, MD  DULoxetine (CYMBALTA) 30 MG capsule Take 2 capsules (60 mg total) by mouth daily. 10/06/15   Baird Cancer, PA-C  fentaNYL (DURAGESIC - DOSED MCG/HR) 100 MCG/HR Apply 2 patches to skin and change every 2 days 11/18/15   Patrici Ranks, MD  fluticasone (FLONASE) 50 MCG/ACT nasal spray Place 2 sprays into both nostrils daily. Patient taking differently: Place 2 sprays into both nostrils daily as needed.  04/22/15  Manon Hilding Kefalas, PA-C  gabapentin (NEURONTIN) 300 MG capsule Take 2 capsules (600 mg total) by mouth 3 (three) times daily. 10/12/15   Baird Cancer, PA-C  HYDROcodone-acetaminophen (LORTAB) 7.5-500 MG/15ML solution Take 15 mLs by mouth every 4 (four) hours as needed for pain. 11/18/15   Patrici Ranks, MD  HYDROcodone-acetaminophen (NORCO) 10-325 MG tablet Take 1-2 tablets by mouth every 4 (four) hours as needed for moderate pain. 10/19/15   Patrici Ranks, MD  levothyroxine (LEVOTHROID) 25 MCG tablet Take 1 tablet (25 mcg total) by mouth daily before breakfast. 10/19/15   Patrici Ranks, MD  lidocaine-prilocaine (EMLA) cream Apply a quarter size amount to port site 1 hour prior to chemo. Do not rub in. Cover with plastic wrap. 10/20/15   Patrici Ranks, MD  linaclotide Rolan Lipa) 145 MCG CAPS capsule Take 1 capsule (145 mcg total) by mouth daily before breakfast. Please ask for procedure for G-tube administration. 10/28/15   Baird Cancer, PA-C  LORazepam  (ATIVAN) 2 MG tablet Take 1 tablet (2 mg total) by mouth every 6 (six) hours as needed for anxiety. 09/08/15   Patrici Ranks, MD  Nutritional Supplements (FEEDING SUPPLEMENT, JEVITY 1.5 CAL/FIBER,) LIQD 5 cans. Nocturnal feed. Rate100 ml/hr x 12 hours. Provides. 1778 kcals, 76 g pro, 900 mls fluid.  Flush 180 mls 5x a day. 01/20/15   Patrici Ranks, MD  nystatin (MYCOSTATIN) 100000 UNIT/ML suspension Take 5 mLs (500,000 Units total) by mouth 4 (four) times daily. Take in addition to Magic Mouthwash. 11/15/15   Baird Cancer, PA-C  oxyCODONE (OXY IR/ROXICODONE) 5 MG immediate release tablet Take 1-2 tablets (5-10 mg total) by mouth every 6 (six) hours as needed for moderate pain or breakthrough pain. 10/07/15   Melrose Nakayama, MD  prochlorperazine (COMPAZINE) 10 MG tablet Take 1 tablet (10 mg total) by mouth every 6 (six) hours as needed for nausea or vomiting. 10/06/15   Baird Cancer, PA-C  QUEtiapine (SEROQUEL) 50 MG tablet Take 1 tablet (50 mg total) by mouth daily as needed (sleep). 11/18/15   Patrici Ranks, MD  sodium fluoride (PREVIDENT 5000 PLUS) 1.1 % CREA dental cream Apply to tooth brush. Brush teeth for 2 minutes. Spit out excess-DO NOT swallow. Repeat nightly. Patient taking differently: Place 1 application onto teeth at bedtime. Apply to tooth brush. Brush teeth for 2 minutes. Spit out excess-DO NOT swallow. Repeat nightly. 12/22/14   Lenn Cal, DDS  tetrahydrozoline-zinc (VISINE-AC) 0.05-0.25 % ophthalmic solution Place 2 drops into both eyes daily as needed (dry eyes).    Historical Provider, MD  triamcinolone cream (KENALOG) 0.1 % Apply 1 application topically 2 (two) times daily. Apply to affected areas. 10/06/15   Baird Cancer, PA-C    Physical Exam: Filed Vitals:   11/03/2015 1630 11/13/2015 1712 11/18/2015 1745 11/06/2015 1800  BP: 91/79 92/64 105/65 113/71  Pulse: 135 133 130 139  Temp:      TempSrc:      Resp: 30 60 30 48  Weight:      SpO2: 43% 96% 96%  96%   Constitutional: NAD, calm, comfortable Filed Vitals:   11/11/2015 1630 11/07/2015 1712 11/16/2015 1745 11/20/2015 1800  BP: 91/79 92/64 105/65 113/71  Pulse: 135 133 130 139  Temp:      TempSrc:      Resp: 30 60 30 48  Weight:      SpO2: 43% 96% 96% 96%   Eyes: PERRL, lids and conjunctivae  normal.  In respiratory distress.  ENMT: Mucous membranes are moist. Posterior pharynx clear of any exudate or lesions.Normal dentition.  Neck: normal, supple, no masses, no thyromegaly Respiratory: clear to auscultation bilaterally, no wheezing, no crackles. Decrease BS throughout.  Cardiovascular: Regular rate and rhythm, no murmurs / rubs / gallops. No extremity edema. 2+ pedal pulses. No carotid bruits.  Abdomen: no tenderness, no masses palpated. No hepatosplenomegaly. Bowel sounds positive. Having PEG Musculoskeletal: no clubbing / cyanosis. No joint deformity upper and lower extremities. Good ROM, no contractures. Normal muscle tone.  Skin: no rashes, lesions, ulcers. No induration Neurologic: CN 2-12 grossly intact. Sensation intact, DTR normal. Strength 5/5 in all 4.  Psychiatric: Normal judgment and insight. Alert and oriented x 3. Normal mood.    Labs on Admission: I have personally reviewed following labs and imaging studies  CBC:  Recent Labs Lab 11/18/15 1130 11/02/2015 1632  WBC 24.2* 0.9*  NEUTROABS 23.5* 0.5*  HGB 8.3* 7.4*  HCT 24.6* 21.7*  MCV 93.2 90.4  PLT 263 606*   Basic Metabolic Panel:  Recent Labs Lab 11/18/15 1130 11/05/2015 1632  NA 131* 133*  K 3.5 3.1*  CL 91* 95*  CO2 30 21*  GLUCOSE 104* 83  BUN 44* 55*  CREATININE 1.43* 3.07*  CALCIUM 8.6* 7.2*  MG 1.8  --    GFR: Estimated Creatinine Clearance: 15.7 mL/min (by C-G formula based on Cr of 3.07). Liver Function Tests:  Recent Labs Lab 11/18/15 1130 11/01/2015 1632  AST 22 43*  ALT 11* 12*  ALKPHOS 145* 38  BILITOT 0.6 0.7  PROT 7.2 5.9*  ALBUMIN 3.7 2.5*   Urine analysis:    Component  Value Date/Time   COLORURINE YELLOW 04/08/2015 [place] 04/08/2015 1359   LABSPEC 1.010 04/08/2015 1359   PHURINE 7.0 04/08/2015 1359   GLUCOSEU NEGATIVE 04/08/2015 1359   HGBUR NEGATIVE 04/08/2015 1359   BILIRUBINUR NEGATIVE 04/08/2015 1359   KETONESUR NEGATIVE 04/08/2015 1359   PROTEINUR NEGATIVE 04/08/2015 1359   UROBILINOGEN 0.2 04/08/2015 1359   NITRITE NEGATIVE 04/08/2015 1359   LEUKOCYTESUR NEGATIVE 04/08/2015 1359    Radiological Exams on Admission: Dg Chest Port 1 View  11/04/2015  CLINICAL DATA:  Fever.  Hypoxia .  Metastatic tongue base cancer. EXAM: PORTABLE CHEST 1 VIEW COMPARISON:  10/07/2015 chest radiograph. FINDINGS: Right internal jugular MediPort terminates near the cavoatrial junction. Surgical clips overlie the left heart. Stable cardiomediastinal silhouette with normal heart size. No pneumothorax. No pleural effusion. There is new extensive patchy consolidation throughout the entire right lung. Previously described left lung base patchy opacities have largely resolved. IMPRESSION: 1. New extensive patchy consolidation throughout the entire right lung, most concerning for multilobar pneumonia. A component of metastatic disease cannot be excluded. Follow-up chest imaging advised. 2. Previously described patchy left lung base opacities have largely resolved. Electronically Signed   By: Ilona Sorrel M.D.   On: 11/20/2015 16:45    EKG: Independently reviewed.   Assessment/Plan Active Problems:   Malignant neoplasm of pharynx (HCC)   Anemia   Chronic renal disease, stage 3, moderately decreased glomerular filtration rate (GFR) between 30-59 mL/min/1.73 square meter   Neutropenic fever ([place])  1. Acute on chronic respiratory failure. Patient was recently diagnosed with lung cancer. Despite respiratory failure, her ABG appears relatively good. Patient appears cachexic and will undoubtebly tire out, so the decision was made to intubate her while in the ED.  Will consult pulmonary tomorrow and Dr. Whitney Muse of hematology/oncology for further  recommendation. Discussed code status and confirmed tonight that she would like to be a FULL CODE.  2. Multilobar CAP. CXR showed new extensive patchy consolidations throughout the entire right lung, concerning for multilobar PNAs. Her lactic acid is trending down. Give fluids, Vanc, Azactam, and Levaquin. Will also add antifungal Diflucan given how ill the patient is. Follow up blood cultures. She will likely needs ventilatory support for a few days.   Will start tube feeding right away.  3. Anemia of chronic disease. Hgb 7.4. Will transfuse 2 units pRBCs. 4. Neutropenic fever. T max 101.4. Will give Neulasta. Follow daily CBC.   5. Acute on chronic kidney disease stage 3. Cr elevated at 3.07. Continue to follow.  Suspect pre renal azotemia.  Will give adequate IVF.   DVT prophylaxis: Heparin  Code Status: Full Family Communication: Discussed with patient and two of her friends at bedside. Patient indicated that she would want any health care decision to be made by her friends, rather than family. Disposition Plan: Discharge once improved. Consults called: Pulmonary and Dr. Whitney Muse of hematology/oncology Admission status: Admit to ICU.   Orvan Falconer, MD FACP [facility]   If 7PM-7AM, please contact night-coverage [email or web address] Password [facility]  10/28/2015, 7:16 PM   By signing my name below, I, Delene Ruffini, attest that this documentation has been prepared under the direction and in the presence of Orvan Falconer, MD. Electronically Signed: Delene Ruffini, Scribe 11/13/2015 7:15pm

## 2015-11-21 NOTE — ED Notes (Addendum)
CRITICAL VALUE ALERT  Critical value received:  Lactic acid 3.66  Date of notification:  11/10/2015  Time of notification:  1934  Critical value read back:Yes.    Nurse who received alert:  Derek Mound, RN  MD notified (1st page):  Marin Comment  Time of first page:  1935  MD notified (2nd page):  Time of second page:  Responding MD:  Marin Comment  Time MD responded:  [phone]

## 2015-11-21 NOTE — ED Notes (Signed)
Confused today per family.  Currently receiving chemo.  Room air sats 66%.  B/p 86/50.  And heart rate in the 140's.  Pt unable to talk due to thrush.

## 2015-11-21 NOTE — ED Notes (Signed)
Dr Marin Comment ask pt who she wants to make decisions for her if she is unable to. Pt states both of her friends in the room can make decisions for her if she is unable.

## 2015-11-21 NOTE — ED Provider Notes (Addendum)
CSN: [id number]     Arrival date & time 11/10/2015  1606 History   First MD Initiated Contact with Patient 11/05/2015 1620     Chief Complaint  Patient presents with  . Altered Mental Status     (Consider location/radiation/quality/duration/timing/severity/associated sxs/prior Treatment) The history is provided by the patient, a friend and a relative.  50 year old female with history of head and neck cancer. Recently diagnosed in February 2016. Patient followed by hematology oncology clinic here at [facility]. Patient was just seen by them on May 25. 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 Started on chemotherapy for this. Patient felt okay until yesterday when she started to get short of breath and felt like she had fevers. Patient got significantly short of breath today. Patient's hematology oncology doctor Dr. Whitney Muse has started discussions about DO NOT RESUSCITATE or living will situations but no final decisions have been made by the patient.  Patient arriving here significantly short of breath normally on 3 L of nasal cannula oxygen. I seen saturations in the 80% range. Patient also hypotensive and significantly tachycardic. Patient meeting sepsis criteria.  Past Medical History  Diagnosis Date  . Heart murmur     as a child  . Chronic bronchitis ([place])   . Anxiety     panic attacks  . Depression   . Headache     onset a few months ago  . Neuropathy ([place])     had it in both hands  . Arthritis   . Fibromyalgia   . Anemia   . Rosacea   . Hypercholesterolemia   . Asthma   . COPD (chronic obstructive pulmonary disease) ([name])   . Seizures ([place])     takes Gabapentin  (last one in 2010); head trauma caused seizures.  . 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 Wears glasses   . Pneumonia   . Hypothyroidism   . GERD (gastroesophageal reflux disease)   . Chronic renal disease,  stage 3, moderately decreased glomerular filtration rate (GFR) between 30-59 mL/min/1.73 square meter 11/11/2015  . Anemia of chronic renal failure, stage 3 (moderate) 11/11/2015   Past Surgical History  Procedure Laterality Date  . Wisdom tooth extraction    . Tonsillectomy Left 08/20/2014    Procedure: TONSILLECTOMY;  Surgeon: Ruby Cola, MD;  Location: [facility] OR;  Service: ENT;  Laterality: Left;  . Radical neck dissection Left 08/20/2014    Procedure: RADICAL LEFT NECK DISSECTION ;  Surgeon: Ruby Cola, MD;  Location: [place];  Service: ENT;  Laterality: Left;  . Tracheostomy tube placement N/A 08/20/2014    Procedure: TRACHEOSTOMY - AWAKE;  Surgeon: Ruby Cola, MD;  Location: [place];  Service: ENT;  Laterality: N/A;  . Gastrostomy tube placement    . Multiple extractions with alveoloplasty N/A 09/17/2014    Procedure: Extraction of tooth #'s [id number] with alveoloplasty, mandibular left lingual torus reduction, and gross debridement of remaining teeth.;  Surgeon: Lenn Cal, DDS;  Location: [place];  Service: Oral Surgery;  Laterality: N/A;  . Portacath placement    . Colonoscopy with propofol N/A 04/15/2015    RMR: colonic polyps removed as described above.,  . Polypectomy N/A 04/15/2015    Procedure: POLYPECTOMY;  Surgeon: Daneil Dolin, MD;  Location: [facility] OR[facility];  Service: Endoscopy;  Laterality: N/A;  . Esophagogastroduodenoscopy (egd) with propofol N/A 07/19/2015    RMR:  poorly postitioned PEG tube. Status post removal and replacement of a small bowel video capsule  . Givens capsule study N/A 07/19/2015    Procedure: GIVENS CAPSULE STUDY;  Surgeon: Daneil Dolin, MD;  Location: [facility];  Service: Endoscopy;  Laterality: N/A;  . Tonsillectomy    . Video bronchoscopy with endobronchial ultrasound N/A 10/07/2015    Procedure: VIDEO BRONCHOSCOPY WITH ENDOBRONCHIAL ULTRASOUND;  Surgeon: Melrose Nakayama, MD;  Location: [place];  Service: Thoracic;  Laterality: N/A;  .  Mediastinotomy Left 10/07/2015    Procedure: LEFT ANTERIOR MEDIASTINOTOMY;  Surgeon: Melrose Nakayama, MD;  Location: [place];  Service: Thoracic;  Laterality: Left;   Family History  Problem Relation Age of Onset  . Neuropathy Mother   . Hypertension Mother   . Alcoholism Father   . Alcoholism Sister    Social History  Substance Use Topics  . Smoking status: Current Every Day Smoker -- 0.50 packs/day for 20 years    Types: Cigarettes  . Smokeless tobacco: Never Used     Comment: Per pt she restarted 2016 as of 08-17-15  . Alcohol Use: 0.0 oz/week    0 Standard drinks or equivalent per week     Comment: rare   OB History    No data available     Review of Systems  Unable to perform ROS: Acuity of condition      Allergies  Bee venom; Bacitracin; Penicillins; and Latex  Home Medications   Prior to Admission medications   Medication Sig Start Date End Date Taking? Authorizing Provider  BIOTIN 5000 PO Take 10,000 mcg by mouth daily.     Historical Provider, MD  cevimeline (EVOXAC) 30 MG capsule Take 1 capsule (30 mg total) by mouth 3 (three) times daily. 10/19/15   Patrici Ranks, MD  clotrimazole-betamethasone (LOTRISONE) cream Apply 1 application topically 2 (two) times daily. Apply to affected area BID 11/11/15   Baird Cancer, PA-C  cyclobenzaprine (FLEXERIL) 10 MG tablet Take 1 tablet (10 mg total) by mouth 3 (three) times daily as needed for muscle spasms. 10/06/15   Baird Cancer, PA-C  Diphenhyd-Hydrocort-Nystatin (FIRST-DUKES MOUTHWASH) SUSP Swish and swallow 4 times a day as needed for mouth pain 10/28/15   Patrici Ranks, MD  DULoxetine (CYMBALTA) 30 MG capsule Take 2 capsules (60 mg total) by mouth daily. 10/06/15   Baird Cancer, PA-C  fentaNYL (DURAGESIC - DOSED MCG/HR) 100 MCG/HR Apply 2 patches to skin and change every 2 days 11/18/15   Patrici Ranks, MD  fluticasone (FLONASE) 50 MCG/ACT nasal spray Place 2 sprays into both nostrils daily. Patient  taking differently: Place 2 sprays into both nostrils daily as needed.  04/22/15   Baird Cancer, PA-C  gabapentin (NEURONTIN) 300 MG capsule Take 2 capsules (600 mg total) by mouth 3 (three) times daily. 10/12/15   Baird Cancer, PA-C  HYDROcodone-acetaminophen (LORTAB) 7.5-500 MG/15ML solution Take 15 mLs by mouth every 4 (four) hours as needed for pain. 11/18/15   Patrici Ranks, MD  HYDROcodone-acetaminophen (NORCO) 10-325 MG tablet Take 1-2 tablets by mouth every 4 (four) hours as needed for moderate pain. 10/19/15   Patrici Ranks, MD  levothyroxine (LEVOTHROID) 25 MCG tablet Take 1 tablet (25 mcg total) by mouth daily before breakfast. 10/19/15   Patrici Ranks, MD  lidocaine-prilocaine (EMLA) cream Apply a quarter size amount to port site 1 hour prior to chemo. Do not rub in. Cover with plastic wrap. 10/20/15  Patrici Ranks, MD  linaclotide (LINZESS) 145 MCG CAPS capsule Take 1 capsule (145 mcg total) by mouth daily before breakfast. Please ask for procedure for G-tube administration. 10/28/15   Baird Cancer, PA-C  LORazepam (ATIVAN) 2 MG tablet Take 1 tablet (2 mg total) by mouth every 6 (six) hours as needed for anxiety. 09/08/15   Patrici Ranks, MD  Nutritional Supplements (FEEDING SUPPLEMENT, JEVITY 1.5 CAL/FIBER,) LIQD 5 cans. Nocturnal feed. Rate100 ml/hr x 12 hours. Provides. 1778 kcals, 76 g pro, 900 mls fluid.  Flush 180 mls 5x a day. 01/20/15   Patrici Ranks, MD  nystatin (MYCOSTATIN) 100000 UNIT/ML suspension Take 5 mLs (500,000 Units total) by mouth 4 (four) times daily. Take in addition to Magic Mouthwash. 11/15/15   Baird Cancer, PA-C  oxyCODONE (OXY IR/ROXICODONE) 5 MG immediate release tablet Take 1-2 tablets (5-10 mg total) by mouth every 6 (six) hours as needed for moderate pain or breakthrough pain. 10/07/15   Melrose Nakayama, MD  prochlorperazine (COMPAZINE) 10 MG tablet Take 1 tablet (10 mg total) by mouth every 6 (six) hours as needed for nausea  or vomiting. 10/06/15   Baird Cancer, PA-C  QUEtiapine (SEROQUEL) 50 MG tablet Take 1 tablet (50 mg total) by mouth daily as needed (sleep). 11/18/15   Patrici Ranks, MD  sodium fluoride (PREVIDENT 5000 PLUS) 1.1 % CREA dental cream Apply to tooth brush. Brush teeth for 2 minutes. Spit out excess-DO NOT swallow. Repeat nightly. Patient taking differently: Place 1 application onto teeth at bedtime. Apply to tooth brush. Brush teeth for 2 minutes. Spit out excess-DO NOT swallow. Repeat nightly. 12/22/14   Lenn Cal, DDS  tetrahydrozoline-zinc (VISINE-AC) 0.05-0.25 % ophthalmic solution Place 2 drops into both eyes daily as needed (dry eyes).    Historical Provider, MD  triamcinolone cream (KENALOG) 0.1 % Apply 1 application topically 2 (two) times daily. Apply to affected areas. 10/06/15   Baird Cancer, PA-C   BP 113/71 mmHg  Pulse 139  Temp(Src) 101.4 F (38.6 C) (Rectal)  Resp 48  Wt 44.906 kg  SpO2 96%  LMP 07/15/2014 Physical Exam  Constitutional: She is oriented to person, place, and time. She appears well-developed. She appears distressed.  HENT:  Head: Normocephalic and atraumatic.  Mouth/Throat: Oropharynx is clear and moist.  Eyes: Conjunctivae and EOM are normal. Pupils are equal, round, and reactive to light.  Neck: Normal range of motion.  Cardiovascular: Regular rhythm and normal heart sounds.   Tachycardic  Pulmonary/Chest: Breath sounds normal. She is in respiratory distress. She has no wheezes. She exhibits no tenderness.  Abdominal: Soft. Bowel sounds are normal. She exhibits no distension. There is tenderness.  Musculoskeletal: She exhibits no edema.  Neurological: She is alert and oriented to person, place, and time. A cranial nerve deficit is present. She exhibits normal muscle tone. Coordination normal.  Skin: Skin is warm. There is erythema.  Nursing note and vitals reviewed.   ED Course  Procedures (including critical care time) Labs Review Labs  Reviewed  COMPREHENSIVE METABOLIC PANEL - Abnormal; Notable for the following:    Sodium 133 (*)    Potassium 3.1 (*)    Chloride 95 (*)    CO2 21 (*)    BUN 55 (*)    Creatinine, Ser 3.07 (*)    Calcium 7.2 (*)    Total Protein 5.9 (*)    Albumin 2.5 (*)    AST 43 (*)    ALT 12 (*)  GFR calc non Af Amer 17 (*)    GFR calc Af Amer 19 (*)    Anion gap 17 (*)    All other components within normal limits  CBC WITH DIFFERENTIAL/PLATELET - Abnormal; Notable for the following:    WBC 0.9 (*)    RBC 2.40 (*)    Hemoglobin 7.4 (*)    HCT 21.7 (*)    RDW 18.0 (*)    Platelets 134 (*)    Neutro Abs 0.5 (*)    Lymphs Abs 0.1 (*)    All other components within normal limits  BLOOD GAS, ARTERIAL - Abnormal; Notable for the following:    pH, Arterial 7.349 (*)    pO2, Arterial 78.1 (*)    Acid-base deficit 4.8 (*)    All other components within normal limits  I-STAT CG4 LACTIC ACID, ED - Abnormal; Notable for the following:    Lactic Acid, Venous 6.76 (*)    All other components within normal limits  I-STAT CG4 LACTIC ACID, ED - Abnormal; Notable for the following:    Lactic Acid, Venous 6.43 (*)    All other components within normal limits  CULTURE, BLOOD (ROUTINE X 2)  CULTURE, BLOOD (ROUTINE X 2)  URINE CULTURE  URINALYSIS, ROUTINE W REFLEX MICROSCOPIC (NOT AT [facility])  TYPE AND SCREEN  PREPARE RBC (CROSSMATCH)   Results for orders placed or performed during the hospital encounter of 10/29/2015  Comprehensive metabolic panel  Result Value Ref Range   Sodium 133 (L) 135 - 145 mmol/L   Potassium 3.1 (L) 3.5 - 5.1 mmol/L   Chloride 95 (L) 101 - 111 mmol/L   CO2 21 (L) 22 - 32 mmol/L   Glucose, Bld 83 65 - 99 mg/dL   BUN 55 (H) 6 - 20 mg/dL   Creatinine, Ser 3.07 (H) 0.44 - 1.00 mg/dL   Calcium 7.2 (L) 8.9 - 10.3 mg/dL   Total Protein 5.9 (L) 6.5 - 8.1 g/dL   Albumin 2.5 (L) 3.5 - 5.0 g/dL   AST 43 (H) 15 - 41 U/L   ALT 12 (L) 14 - 54 U/L   Alkaline Phosphatase 38 38 - 126  U/L   Total Bilirubin 0.7 0.3 - 1.2 mg/dL   GFR calc non Af Amer 17 (L) >60 mL/min   GFR calc Af Amer 19 (L) >60 mL/min   Anion gap 17 (H) 5 - 15  CBC WITH DIFFERENTIAL  Result Value Ref Range   WBC 0.9 (LL) 4.0 - 10.5 K/uL   RBC 2.40 (L) 3.87 - 5.11 MIL/uL   Hemoglobin 7.4 (L) 12.0 - 15.0 g/dL   HCT 21.7 (L) 36.0 - 46.0 %   MCV 90.4 78.0 - 100.0 fL   MCH 30.8 26.0 - 34.0 pg   MCHC 34.1 30.0 - 36.0 g/dL   RDW 18.0 (H) 11.5 - 15.5 %   Platelets 134 (L) 150 - 400 K/uL   Neutrophils Relative % 56 %   Neutro Abs 0.5 (L) 1.7 - 7.7 K/uL   Band Neutrophils 0 %   Lymphocytes Relative 9 %   Lymphs Abs 0.1 (L) 0.7 - 4.0 K/uL   Monocytes Relative 32 %   Monocytes Absolute 0.3 0.1 - 1.0 K/uL   Eosinophils Relative 1 %   Eosinophils Absolute 0 0.0 - 0.7 K/uL   Basophils Relative 0 %   Basophils Absolute 0 0.0 - 0.1 K/uL   WBC Morphology SMEAR STAINED AND AVAILABLE FOR REVIEW    Smear Review WHITE  COUNT CONFIRMED ON SMEAR   Blood gas, arterial ([facility] ONLY)  Result Value Ref Range   FIO2 80.00    Delivery systems BILEVEL POSITIVE AIRWAY PRESSURE    LHR 20 resp/min   Inspiratory PAP 13    Expiratory PAP 5    pH, Arterial 7.349 (L) 7.350 - 7.450   pCO2 arterial 36.9 35.0 - 45.0 mmHg   pO2, Arterial 78.1 (L) 80.0 - 100.0 mmHg   Bicarbonate 20.4 20.0 - 24.0 mEq/L   Acid-base deficit 4.8 (H) 0.0 - 2.0 mmol/L   O2 Saturation 92.9 %   Patient temperature 101.4    Collection site RIGHT RADIAL    Drawn by [id number]    Sample type ARTERIAL DRAW    Allens test (pass/fail) PASS PASS  I-Stat CG4 Lactic Acid, ED  (not at  [facility])  Result Value Ref Range   Lactic Acid, Venous 6.76 (HH) 0.5 - 2.0 mmol/L   Comment NOTIFIED PHYSICIAN   I-Stat CG4 Lactic Acid, ED  Result Value Ref Range   Lactic Acid, Venous 6.43 (HH) 0.5 - 2.0 mmol/L  Type and screen [facility]  Result Value Ref Range   ABO/RH(D) A POS    Antibody Screen NEG    Sample Expiration 11/24/2015    Unit Number [id number]     Blood Component Type RED CELLS,LR    Unit division 00    Status of Unit ALLOCATED    Transfusion Status OK TO TRANSFUSE    Crossmatch Result Compatible    Unit Number [id number]    Blood Component Type RED CELLS,LR    Unit division 00    Status of Unit ALLOCATED    Transfusion Status OK TO TRANSFUSE    Crossmatch Result Compatible   Prepare RBC  Result Value Ref Range   Order Confirmation ORDER PROCESSED BY BLOOD BANK      Imaging Review Dg Chest Port 1 View  11/24/2015  CLINICAL DATA:  Fever.  Hypoxia .  Metastatic tongue base cancer. EXAM: PORTABLE CHEST 1 VIEW COMPARISON:  10/07/2015 chest radiograph. FINDINGS: Right internal jugular MediPort terminates near the cavoatrial junction. Surgical clips overlie the left heart. Stable cardiomediastinal silhouette with normal heart size. No pneumothorax. No pleural effusion. There is new extensive patchy consolidation throughout the entire right lung. Previously described left lung base patchy opacities have largely resolved. IMPRESSION: 1. New extensive patchy consolidation throughout the entire right lung, most concerning for multilobar pneumonia. A component of metastatic disease cannot be excluded. Follow-up chest imaging advised. 2. Previously described patchy left lung base opacities have largely resolved. Electronically Signed   By: Ilona Sorrel M.D.   On: 11/08/2015 16:45   I have personally reviewed and evaluated these images and lab results as part of my medical decision-making.   EKG Interpretation None      ED ECG REPORT   Date: 11/11/2015  Rate: 135  Rhythm: sinus tachycardia  QRS Axis: normal  Intervals: normal  ST/T Wave abnormalities: nonspecific ST/T changes  Conduction Disutrbances:none  Narrative Interpretation:   Old EKG Reviewed: none available Did not link from MUSE     I have personally reviewed the EKG tracing and agree with the computerized printout as noted.   CRITICAL CARE Performed by:  Fredia Sorrow Total critical care time: 95 minutes Critical care time was exclusive of separately billable procedures and treating other patients. Critical care was necessary to treat or prevent imminent or life-threatening deterioration. Critical care was time spent personally by me on  the following activities: development of treatment plan with patient and/or surrogate as well as nursing, discussions with consultants, evaluation of patient's response to treatment, examination of patient, obtaining history from patient or surrogate, ordering and performing treatments and interventions, ordering and review of laboratory studies, ordering and review of radiographic studies, pulse oximetry and re-evaluation of patient's condition.    MDM   Final diagnoses:  HCAP (healthcare-associated pneumonia)  Malignant neoplasm metastatic to left lung (HCC)  Sepsis, due to unspecified organism ([place])  Leukopenia  Anemia, unspecified anemia type  Acute respiratory failure with hypoxia ([place])   Upon arrival patient met sepsis criteria. Sepsis order set started patient's initial fluid challenge was 1.5 L. Patient's blood pressure did respond to the fluids. Patient's had a total of 2 L of fluids so far. Patient started on broad-spectrum antibiotic she is penicillin allergic. Patient shown to have a worsening anemia compared to just recent lab tests. Patient's hemoglobin was in the 7 range. Patient had type and screen and 2 units of blood ordered. Patient did not improve 100% nonrebreather she and sats improve the patient was working extremely hard to breathe. Patient started on BiPAP. Arterial blood gas shows no evidence of significant acidosis or retention of carbon dioxide. Chest x-ray consistent with a right-sided pneumonia. No evidence of any worsening of the metastatic disease to the left lung. She appears better. In addition patient's lactic acid was elevated in the 6 range. Patient's white blood cell count was  consistent with a leukopenia. Platelet count was normal.    patient with resuscitation showed improvements. However patient is still breathing fast still tachycardic but blood pressures have stabilized most recent systolic blood pressure was 113.     Fredia Sorrow, MD 11/07/2015 1902   We discussed with patient and she wanted to be a full CODE STATUS. It was decided to electively intubate her to stabilize her rapid breathing rate even though she had improved significantly. To make it less likely that she would run into an airway problem overnight particular in the face of her prior head and neck surgery.  Patient was intubated without any difficulties. Evidence of radiation changes in the oral pharynx and her vocal cords appeared not normal. Following intubation patient with good chest movement, breath sounds bilaterally. Follow-up chest x-ray pending.   INTUBATION Performed by: Birl Lobello  Required items: required blood products, implants, devices, and special equipment available Patient identity confirmed: provided demographic data and hospital-assigned identification number Time out: Immediately prior to procedure a "time out" was called to verify the correct patient, procedure, equipment, support staff and site/side marked as required.  Indications: Persistent respiratory distress and rapid respiratory rate on BiPAP.   Intubation method: #3 blade Glidescope Laryngoscopy   Preoxygenation: 100% BVM  Sedatives:  20 mg Etomidate Paralytic:  100 mg Succinylcholine  Tube Size: 7.5 cuffed  Post-procedure assessment: chest rise and ETCO2 monitor Breath sounds: equal and absent over the epigastrium Tube secured with: ETT holder Chest x-ray interpreted by radiologist and me.  Chest x-ray findings: Chest x-ray still pending   Patient tolerated the procedure well with no immediate complications.     Fredia Sorrow, MD 10/27/2015 2035

## 2015-11-21 NOTE — ED Notes (Signed)
Patient has pain patches attached to back prior to arrival, family at bedside. Patient had a small amount of stool. Cleaned and repositioned.

## 2015-11-22 DIAGNOSIS — J189 Pneumonia, unspecified organism: Secondary | ICD-10-CM | POA: Insufficient documentation

## 2015-11-22 DIAGNOSIS — D709 Neutropenia, unspecified: Secondary | ICD-10-CM

## 2015-11-22 DIAGNOSIS — J9601 Acute respiratory failure with hypoxia: Secondary | ICD-10-CM | POA: Insufficient documentation

## 2015-11-22 DIAGNOSIS — R5081 Fever presenting with conditions classified elsewhere: Secondary | ICD-10-CM

## 2015-11-22 DIAGNOSIS — C140 Malignant neoplasm of pharynx, unspecified: Secondary | ICD-10-CM

## 2015-11-22 DIAGNOSIS — J9620 Acute and chronic respiratory failure, unspecified whether with hypoxia or hypercapnia: Secondary | ICD-10-CM

## 2015-11-22 DIAGNOSIS — C7802 Secondary malignant neoplasm of left lung: Secondary | ICD-10-CM

## 2015-11-22 DIAGNOSIS — N183 Chronic kidney disease, stage 3 (moderate): Secondary | ICD-10-CM

## 2015-11-22 LAB — GLUCOSE, CAPILLARY
Glucose-Capillary: 101 mg/dL — ABNORMAL HIGH (ref 65–99)
Glucose-Capillary: 131 mg/dL — ABNORMAL HIGH (ref 65–99)
Glucose-Capillary: 96 mg/dL (ref 65–99)

## 2015-11-22 LAB — BLOOD GAS, ARTERIAL
Acid-Base Excess: 7.6 mmol/L — ABNORMAL HIGH (ref 0.0–2.0)
Acid-base deficit: 9.7 mmol/L — ABNORMAL HIGH (ref 0.0–2.0)
BICARBONATE: 18.1 meq/L — AB (ref 20.0–24.0)
Bicarbonate: 16.5 mEq/L — ABNORMAL LOW (ref 20.0–24.0)
DRAWN BY: 23534
Drawn by: 22223
FIO2: 100
FIO2: 100
LHR: 24 {breaths}/min
MECHVT: 500 mL
MECHVT: 500 mL
O2 Content: 100 L/min
O2 SAT: 89.6 %
O2 Saturation: 91.3 %
PCO2 ART: 36.4 mmHg (ref 35.0–45.0)
PEEP/CPAP: 5 cmH2O
PEEP: 7 cmH2O
PH ART: 7.292 — AB (ref 7.350–7.450)
PH ART: 7.265 — AB (ref 7.350–7.450)
RATE: 28 resp/min
TCO2: 12.5 mmol/L (ref 0–100)
pCO2 arterial: 37.9 mmHg (ref 35.0–45.0)
pO2, Arterial: 68.2 mmHg — ABNORMAL LOW (ref 80.0–100.0)
pO2, Arterial: 63 mmHg — ABNORMAL LOW (ref 80.0–100.0)

## 2015-11-22 LAB — COMPREHENSIVE METABOLIC PANEL
ALT: 14 U/L (ref 14–54)
ANION GAP: 9 (ref 5–15)
AST: 50 U/L — ABNORMAL HIGH (ref 15–41)
Albumin: 1.8 g/dL — ABNORMAL LOW (ref 3.5–5.0)
Alkaline Phosphatase: 21 U/L — ABNORMAL LOW (ref 38–126)
BILIRUBIN TOTAL: 1 mg/dL (ref 0.3–1.2)
BUN: 53 mg/dL — ABNORMAL HIGH (ref 6–20)
CO2: 18 mmol/L — ABNORMAL LOW (ref 22–32)
Calcium: 5.9 mg/dL — CL (ref 8.9–10.3)
Chloride: 109 mmol/L (ref 101–111)
Creatinine, Ser: 2.68 mg/dL — ABNORMAL HIGH (ref 0.44–1.00)
GFR calc Af Amer: 23 mL/min — ABNORMAL LOW (ref >60)
GFR, EST NON AFRICAN AMERICAN: 20 mL/min — AB (ref >60)
Glucose, Bld: 150 mg/dL — ABNORMAL HIGH (ref 65–99)
POTASSIUM: 2.7 mmol/L — AB (ref 3.5–5.1)
Sodium: 136 mmol/L (ref 135–145)
TOTAL PROTEIN: 4.4 g/dL — AB (ref 6.5–8.1)

## 2015-11-22 LAB — CBC
HEMATOCRIT: 22.6 % — AB (ref 36.0–46.0)
HEMATOCRIT: 23.7 % — AB (ref 36.0–46.0)
HEMOGLOBIN: 7.8 g/dL — AB (ref 12.0–15.0)
HEMOGLOBIN: 8.1 g/dL — AB (ref 12.0–15.0)
MCH: 30.7 pg (ref 26.0–34.0)
MCH: 30 pg (ref 26.0–34.0)
MCHC: 34.5 g/dL (ref 30.0–36.0)
MCHC: 34.2 g/dL (ref 30.0–36.0)
MCV: 89 fL (ref 78.0–100.0)
MCV: 87.8 fL (ref 78.0–100.0)
Platelets: 63 10*3/uL — ABNORMAL LOW (ref 150–400)
Platelets: 45 10*3/uL — ABNORMAL LOW (ref 150–400)
RBC: 2.54 MIL/uL — AB (ref 3.87–5.11)
RBC: 2.7 MIL/uL — ABNORMAL LOW (ref 3.87–5.11)
RDW: 17.2 % — ABNORMAL HIGH (ref 11.5–15.5)
RDW: 17.2 % — AB (ref 11.5–15.5)
WBC: 0.3 10*3/uL — CL (ref 4.0–10.5)
WBC: 0.3 10*3/uL — AB (ref 4.0–10.5)

## 2015-11-22 LAB — LACTIC ACID, PLASMA: LACTIC ACID, VENOUS: 2.7 mmol/L — AB (ref 0.5–2.0)

## 2015-11-22 LAB — PHOSPHORUS
Phosphorus: 2.4 mg/dL — ABNORMAL LOW (ref 2.5–4.6)
Phosphorus: 2.7 mg/dL (ref 2.5–4.6)

## 2015-11-22 LAB — MAGNESIUM
Magnesium: 1 mg/dL — ABNORMAL LOW (ref 1.7–2.4)
Magnesium: 1 mg/dL — ABNORMAL LOW (ref 1.7–2.4)

## 2015-11-22 LAB — CREATININE, SERUM
CREATININE: 2.61 mg/dL — AB (ref 0.44–1.00)
GFR calc Af Amer: 24 mL/min — ABNORMAL LOW (ref >60)
GFR calc non Af Amer: 20 mL/min — ABNORMAL LOW (ref >60)

## 2015-11-22 LAB — T4, FREE: Free T4: 0.89 ng/dL (ref 0.61–1.12)

## 2015-11-22 LAB — TSH: TSH: 3.14 u[IU]/mL (ref 0.350–4.500)

## 2015-11-22 LAB — MRSA PCR SCREENING: MRSA by PCR: POSITIVE — AB

## 2015-11-22 MED ORDER — SODIUM CHLORIDE 0.9 % IV SOLN
INTRAVENOUS | Status: DC
  Administered 2015-11-22: 10:00:00 via INTRAVENOUS

## 2015-11-22 MED ORDER — MORPHINE BOLUS VIA INFUSION
5.0000 mg | INTRAVENOUS | Status: DC | PRN
  Filled 2015-11-22: qty 20

## 2015-11-22 MED ORDER — SODIUM CHLORIDE 0.9 % IV SOLN
1.0000 g | Freq: Once | INTRAVENOUS | Status: DC
  Filled 2015-11-22: qty 10

## 2015-11-22 MED ORDER — FENTANYL CITRATE (PF) 2500 MCG/50ML IJ SOLN
INTRAMUSCULAR | Status: AC
  Filled 2015-11-22: qty 50

## 2015-11-22 MED ORDER — MIDAZOLAM HCL 2 MG/2ML IJ SOLN: 2.0000 mg | INTRAMUSCULAR | Status: DC | PRN

## 2015-11-22 MED ORDER — VANCOMYCIN HCL 500 MG IV SOLR
500.0000 mg | INTRAVENOUS | Status: DC
  Filled 2015-11-22: qty 500

## 2015-11-22 MED ORDER — FENTANYL CITRATE (PF) 100 MCG/2ML IJ SOLN: 100.0000 ug | INTRAMUSCULAR | Status: DC | PRN

## 2015-11-22 MED ORDER — FENTANYL CITRATE (PF) 100 MCG/2ML IJ SOLN: 50.0000 ug | Freq: Once | INTRAMUSCULAR | Status: DC

## 2015-11-22 MED ORDER — HYDROCORTISONE NA SUCCINATE PF 100 MG IJ SOLR
50.0000 mg | Freq: Three times a day (TID) | INTRAMUSCULAR | Status: DC
  Administered 2015-11-22: 16:00:00 via INTRAVENOUS
  Administered 2015-11-22: 50 mg via INTRAVENOUS
  Filled 2015-11-22 (×2): qty 2

## 2015-11-22 MED ORDER — FENTANYL BOLUS VIA INFUSION
50.0000 ug | INTRAVENOUS | Status: DC | PRN
Start: 2015-11-22 — End: 2015-11-23
  Filled 2015-11-22: qty 50

## 2015-11-22 MED ORDER — SODIUM CHLORIDE 0.9 % IV SOLN
1.0000 mg/h | INTRAVENOUS | Status: DC
  Administered 2015-11-22: 2.5 mg/h via INTRAVENOUS
  Administered 2015-11-22: 4.5 mg/h via INTRAVENOUS
  Filled 2015-11-22: qty 10

## 2015-11-22 MED ORDER — MAGNESIUM SULFATE 4 GM/100ML IV SOLN
4.0000 g | Freq: Once | INTRAVENOUS | Status: AC
  Administered 2015-11-22: 4 g via INTRAVENOUS
  Filled 2015-11-22: qty 100

## 2015-11-22 MED ORDER — POTASSIUM CHLORIDE 10 MEQ/100ML IV SOLN
10.0000 meq | INTRAVENOUS | Status: AC
  Administered 2015-11-22 (×3): 10 meq via INTRAVENOUS
  Filled 2015-11-22 (×2): qty 100

## 2015-11-22 MED ORDER — CHLORHEXIDINE GLUCONATE 0.12% ORAL RINSE (MEDLINE KIT): 15.0000 mL | Freq: Two times a day (BID) | OROMUCOSAL | Status: DC

## 2015-11-22 MED ORDER — DEXTROSE 5 % IV SOLN
1.0000 g | Freq: Three times a day (TID) | INTRAVENOUS | Status: DC
  Administered 2015-11-22 (×2): 1 g via INTRAVENOUS
  Filled 2015-11-22 (×7): qty 1

## 2015-11-22 MED ORDER — SODIUM CHLORIDE 0.9 % IV SOLN
25.0000 ug/h | INTRAVENOUS | Status: DC
  Filled 2015-11-22: qty 50

## 2015-11-22 MED ORDER — MIDAZOLAM HCL 50 MG/10ML IJ SOLN
INTRAMUSCULAR | Status: AC
  Filled 2015-11-22: qty 1

## 2015-11-22 MED ORDER — ANTISEPTIC ORAL RINSE SOLUTION (CORINZ)
7.0000 mL | Freq: Four times a day (QID) | OROMUCOSAL | Status: DC
  Administered 2015-11-22: 7 mL via OROMUCOSAL

## 2015-11-22 MED ORDER — DOCUSATE SODIUM 50 MG/5ML PO LIQD: 100.0000 mg | Freq: Two times a day (BID) | ORAL | Status: DC | PRN

## 2015-11-22 MED ORDER — MAGNESIUM SULFATE 4 GM/100ML IV SOLN
INTRAVENOUS | Status: AC
  Filled 2015-11-22: qty 100

## 2015-11-22 MED ORDER — SODIUM CHLORIDE 0.9 % IV BOLUS (SEPSIS)
1000.0000 mL | Freq: Once | INTRAVENOUS | Status: AC
  Administered 2015-11-22: 1000 mL via INTRAVENOUS

## 2015-11-22 MED ORDER — CHLORHEXIDINE GLUCONATE CLOTH 2 % EX PADS: 6.0000 | MEDICATED_PAD | Freq: Every day | CUTANEOUS | Status: DC

## 2015-11-22 MED ORDER — FUROSEMIDE 10 MG/ML IJ SOLN
40.0000 mg | Freq: Once | INTRAMUSCULAR | Status: AC
  Administered 2015-11-22: 40 mg via INTRAVENOUS
  Filled 2015-11-22: qty 4

## 2015-11-22 MED ORDER — LEVOFLOXACIN IN D5W 500 MG/100ML IV SOLN: 500.0000 mg | INTRAVENOUS | Status: DC

## 2015-11-22 MED ORDER — NOREPINEPHRINE BITARTRATE 1 MG/ML IV SOLN
INTRAVENOUS | Status: AC
  Filled 2015-11-22: qty 4

## 2015-11-22 MED ORDER — SODIUM CHLORIDE 0.9 % IV SOLN
1.0000 mg/h | INTRAVENOUS | Status: DC
  Filled 2015-11-22: qty 10

## 2015-11-22 MED ORDER — NOREPINEPHRINE BITARTRATE 1 MG/ML IV SOLN
0.0000 ug/min | INTRAVENOUS | Status: DC
  Administered 2015-11-22: 10 ug/min via INTRAVENOUS
  Administered 2015-11-22: 26 ug/min via INTRAVENOUS
  Administered 2015-11-22: 2 ug/min via INTRAVENOUS
  Administered 2015-11-22: 26 ug/min via INTRAVENOUS
  Filled 2015-11-22 (×4): qty 4

## 2015-11-22 MED ORDER — MORPHINE SULFATE 25 MG/ML IV SOLN
10.0000 mg/h | INTRAVENOUS | Status: DC
  Filled 2015-11-22: qty 10

## 2015-11-22 MED ORDER — MUPIROCIN 2 % EX OINT
1.0000 "application " | TOPICAL_OINTMENT | Freq: Two times a day (BID) | CUTANEOUS | Status: DC
  Administered 2015-11-22 (×2): 1 via NASAL
  Filled 2015-11-22 (×2): qty 22

## 2015-11-22 MED ORDER — FILGRASTIM 300 MCG/ML IJ SOLN
300.0000 ug | Freq: Every day | INTRAMUSCULAR | Status: DC
  Filled 2015-11-22 (×3): qty 1

## 2015-11-23 ENCOUNTER — Ambulatory Visit (HOSPITAL_COMMUNITY): Payer: Self-pay | Admitting: Hematology & Oncology

## 2015-11-23 ENCOUNTER — Other Ambulatory Visit (HOSPITAL_COMMUNITY): Payer: Self-pay | Admitting: Oncology

## 2015-11-23 ENCOUNTER — Telehealth: Payer: Self-pay

## 2015-11-23 LAB — URINE CULTURE: Culture: <10000 — AB

## 2015-11-23 LAB — BLOOD CULTURE ID PANEL (REFLEXED)
ACINETOBACTER BAUMANNII: NOT DETECTED
CANDIDA ALBICANS: NOT DETECTED
CANDIDA KRUSEI: NOT DETECTED
CANDIDA PARAPSILOSIS: NOT DETECTED
CARBAPENEM RESISTANCE: NOT DETECTED
Candida glabrata: NOT DETECTED
Candida tropicalis: NOT DETECTED
ENTEROBACTER CLOACAE COMPLEX: NOT DETECTED
ENTEROBACTERIACEAE SPECIES: NOT DETECTED
ESCHERICHIA COLI: NOT DETECTED
Enterococcus species: NOT DETECTED
Haemophilus influenzae: NOT DETECTED
KLEBSIELLA OXYTOCA: NOT DETECTED
KLEBSIELLA PNEUMONIAE: NOT DETECTED
Listeria monocytogenes: NOT DETECTED
Methicillin resistance: DETECTED — AB
NEISSERIA MENINGITIDIS: NOT DETECTED
PSEUDOMONAS AERUGINOSA: NOT DETECTED
Proteus species: NOT DETECTED
STAPHYLOCOCCUS SPECIES: DETECTED — AB
STREPTOCOCCUS AGALACTIAE: NOT DETECTED
STREPTOCOCCUS PNEUMONIAE: NOT DETECTED
STREPTOCOCCUS SPECIES: NOT DETECTED
Serratia marcescens: NOT DETECTED
Staphylococcus aureus (BCID): DETECTED — AB
Streptococcus pyogenes: NOT DETECTED
Vancomycin resistance: NOT DETECTED

## 2015-11-23 LAB — TYPE AND SCREEN
ABO/RH(D): A POS
ANTIBODY SCREEN: NEGATIVE
UNIT DIVISION: 0
UNIT DIVISION: 0

## 2015-11-23 NOTE — Telephone Encounter (Signed)
On 11/23/2015 I received a death certificate from [facility] (faxed). The death certificate is for (unknown). The patient is a patient of Doctor McQuaid. The death certificate will be taken to Pulmonary Unit @ [place] this pm for signature. On 2015-12-12 I received the death certificate back from Doctor McQuaid. I got the death certificate ready and faxed the death certificate over to the funeral home per their request.

## 2015-11-24 ENCOUNTER — Other Ambulatory Visit (HOSPITAL_COMMUNITY): Payer: Self-pay | Admitting: Hematology & Oncology

## 2015-11-25 ENCOUNTER — Inpatient Hospital Stay (HOSPITAL_COMMUNITY): Payer: Self-pay

## 2015-11-25 LAB — CULTURE, BLOOD (ROUTINE X 2)

## 2015-11-25 NOTE — Progress Notes (Signed)
eLink Physician-Brief Progress Note Patient Name: Shelby Larson DOB: 10-08-1965 MRN: [id number]   Date of Service  12/06/2015  HPI/Events of Note  I spoke with mother and sister over camera. They want to change goals to comfort only with withdrawal of care, terminal extubation.  eICU Interventions  Orders entered.     Intervention Category Evaluation Type: Other  Cosima Prentiss 2015/12/06, 5:51 PM

## 2015-11-25 NOTE — Procedures (Signed)
Extubation Procedure Note  Patient Details:   Name: Shelby Larson DOB: 02/10/1966 MRN: [id number]   Airway Documentation:     Evaluation  O2 sats: 95 Complications: none Patient tolerated procedure well. Bilateral Breath Sounds: Rhonchi   Pt extubated per terminal wean protocol.  Martha Clan 2015-12-14, 6:23 PM

## 2015-11-25 NOTE — Progress Notes (Signed)
Positive Blood Culture called to [facility] office, unsure of where to report due to patient death. Blood culture is Gram positive Cocci in clusters.

## 2015-11-25 NOTE — Progress Notes (Signed)
Casar arrived to bedside very soon after pt passed. Pt's mother, sister and three friends were bedside. They immediately asked for prayer as they appropriately grieved their loss. Following prayer pt's mother questioned her healthcare decision about pt care. Pt's nurse and I tried to reassure her that she has done everything she could. Sometimes our healing comes after this life. Our words and her faith seemed to help with her resolve. Pt's family left quickly after prayer. Chaplain Ernest Haber, M.Div.   12-10-15 1800  Clinical Encounter Type  Visited With Family

## 2015-11-25 NOTE — Progress Notes (Signed)
[place] Progress Note Patient Name: Shelby Larson DOB: Jun 30, 1965 MRN: [id number]   Date of Service  2015-12-06  HPI/Events of Note  Pt on fentanyl drip but order expired.  eICU Interventions  Re entered order.     Intervention Category Intermediate Interventions: Other:  Maleni Seyer 06-Dec-2015, 3:54 PM

## 2015-11-25 NOTE — Progress Notes (Signed)
Family and friends at bedside gathered close. Respirations ceased. Heartbeat not audible at this time. Asystole on monitor. Confirmed by Park Breed RN and Prince Rome RN.

## 2015-11-25 NOTE — Progress Notes (Signed)
Family and friends at bedside, ready for extubation. Dr Vaughan Browner made aware and orders received.

## 2015-11-25 NOTE — Progress Notes (Signed)
PROGRESS NOTE    Shelby Larson  OVF:[id number] DOB: [birth year] DOA: 11/10/2015 PCP: [name], MD    Brief Narrative:  50 y.o. female with medical history significant of head/neck cancer, diagnosed in February of 2016. She is followed by the hematology clinic here at [facility], and was recently seen there on May 25. 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 Patient sees Dr. Whitney Muse of Hematology/Oncology and discussions have been started about DNR or living will situations, though no final decision has been made. Patient reports no acute issues until shortness of breath and subjective fevers onset yesterday. Her shortness of breath significantly worsened. Patient found to have new R sided pneumonia with respiratory failure and found to be in septic shock, requiring pressor support.   Assessment & Plan:   Active Problems:   Malignant neoplasm of pharynx (HCC)   Anemia   Chronic renal disease, stage 3, moderately decreased glomerular filtration rate (GFR) between 30-59 mL/min/1.73 square meter   Neutropenic fever (HCC)   Acute on chronic respiratory failure ([place])   1. Acute on chronic respiratory failure.  1. Patient was recently diagnosed with metastatic tongue cancer.  2. Secondary to septic shock from PNA 3. Patient intubated in the ED, remains on vent support 4. On pressor support 5. Critical Care service is unavailable at this time at this facility. Given critical condition, have discussed case with Dr. Ashok Cordia who agrees with transfer to ICU level of care at [facility] with severe sepsis and septic shock on presentation 1. CXR showed new extensive patchy consolidations throughout the entire right lung, concerning for multilobar PNAs.  2. On Vanc, Azactam, and Levaquin. Added antifungal Diflucan given how ill the patient is.  3. Follow up blood pan cultures.  4. Follow  lactic acid trends 5. Pt is currently pressor dependent 6. Pt with hx of hypothyroid. Will check TSH and free T4. Consider empiric stress dosed steroids 3. Anemia of chronic disease. Hgb 7.4. Will transfused 2 units pRBCs. 4. Neutropenic fever. T max 101.4. Given Neulasta. Follow daily CBC.  5. Acute on chronic kidney disease stage 3. Cr presented elevated at 3.07. Cont aggressive IVF resuscitation 6. Tongue cancer 1. S/p 5-FU and radiation tx. Followed by Dr. Whitney Muse 2. S/p Neulasta 4 days prior to admission. Neulasta also given on day of admission.   DVT prophylaxis: Heparin subQ Code Status: Full - Confirmed with family Family Communication: Mother and friend in room Disposition Plan: Transfer to ICU at [facility] , to ICU service   Consultants:   Discussed case with critical care  Procedures:     Antimicrobials: (specify start and planned stop date. Auto populated tables are space occupying and do not give end dates) Anti-infectives    Start     Dose/Rate Route Frequency Ordered Stop   11/23/15 1600  levofloxacin (LEVAQUIN) IVPB 500 mg     500 mg 100 mL/hr over 60 Minutes Intravenous Every 48 hours 12/20/2015 0736     11/23/15 1200  vancomycin (VANCOCIN) 500 mg in sodium chloride 0.9 % 100 mL IVPB     500 mg 100 mL/hr over 60 Minutes Intravenous Every 48 hours 12/20/2015 0737     12/20/2015 0745  aztreonam (AZACTAM) 1 g in dextrose 5 % 50 mL IVPB     1 g 100 mL/hr over 30 Minutes Intravenous Every 8 hours December 20, 2015 0735     11/02/2015 2245  fluconazole (DIFLUCAN) IVPB 100 mg     100 mg 50 mL/hr over 60 Minutes Intravenous Every 24 hours 10/28/2015 2244     11/20/2015 1630  levofloxacin (LEVAQUIN) IVPB 750 mg     750 mg 100 mL/hr over 90 Minutes Intravenous  Once 10/30/2015 1628 11/16/2015 1828   11/19/2015 1630  aztreonam (AZACTAM) 2 g in dextrose 5 % 50 mL IVPB     2 g 100 mL/hr over 30 Minutes Intravenous  Once 11/11/2015 1628 11/12/2015 1749   11/11/2015 1630  vancomycin  (VANCOCIN) IVPB 1000 mg/200 mL premix     1,000 mg 200 mL/hr over 60 Minutes Intravenous  Once 11/10/2015 1628 11/11/2015 1942           Subjective: Remains intubated, sedated  Objective: Filed Vitals:   2015/12/15 0800 2015-12-15 0827 2015-12-15 0829 12/15/15 0900  BP: 86/59   80/55  Pulse: 122  120 124  Temp: 99.1 F (37.3 C)   99.1 F (37.3 C)  TempSrc:      Resp: _0 Height:      Weight:      SpO2: 99% 98% 99% 97%    Intake/Output Summary (Last 24 hours) at December 15, 2015 0946 Last data filed at 2015-12-15 0929  Gross per 24 hour  Intake 7556.25 ml  Output    265 ml  Net 7291.25 ml   Filed Weights   10/25/2015 1620 12/15/2015 0030  Weight: 44.906 kg (99 lb) 48 kg (105 lb 13.1 oz)    Examination:  General exam: Appears calm and comfortable, intubated Respiratory system: Clear to auscultation. Respiratory effort normal. Cardiovascular system: S1 & S2 heard, RRR.  Gastrointestinal system: Abdomen is nondistended, soft and nontender. No organomegaly or masses felt. Normal bowel sounds heard. Central nervous system: sedated. No focal neurological deficits. Extremities: Symmetric 5 x 5 power. Skin: No rashes, lesions Psychiatry: intubated, unable to assess     Data Reviewed: I have personally reviewed following labs and imaging studies  CBC:  Recent Labs Lab 11/18/15 1130 11/07/2015 1632 11/20/2015 2312 December 15, 2015 0445  WBC 24.2* 0.9* 0.3* 0.3*  NEUTROABS 23.5* 0.5*  --   --   HGB 8.3* 7.4* 7.8* 8.1*  HCT 24.6* 21.7* 22.6* 23.7*  MCV 93.2 90.4 89.0 87.8  PLT 263 134* 63* 45*   Basic Metabolic Panel:  Recent Labs Lab 11/18/15 1130 11/24/2015 1632 10/30/2015 2312 12/15/15 0445  NA 131* 133*  --  136  K 3.5 3.1*  --  2.7*  CL 91* 95*  --  109  CO2 30 21*  --  18*  GLUCOSE 104* 83  --  150*  BUN 44* 55*  --  53*  CREATININE 1.43* 3.07* 2.61* 2.68*  CALCIUM 8.6* 7.2*  --  5.9*  MG 1.8  --  1.0* 1.0*  PHOS  --   --  2.4* 2.7   GFR: Estimated Creatinine  Clearance: 19.2 mL/min (by C-G formula based on Cr of 2.68). Liver Function Tests:  Recent Labs Lab 11/18/15 1130 11/23/2015 1632 December 15, 2015 0445  AST 22 43* 50*  ALT 11* 12* 14  ALKPHOS 145* 38 21*  BILITOT 0.6 0.7 1.0  PROT 7.2 5.9* 4.4*  ALBUMIN 3.7 2.5* 1.8*   No results for input(s): LIPASE, AMYLASE in the last 168 hours. No results for input(s): AMMONIA in the last 168 hours. Coagulation Profile: No results for input(s): INR, PROTIME in the last 168 hours. Cardiac Enzymes: No results for input(s): CKTOTAL, CKMB, CKMBINDEX, TROPONINI in the  last 168 hours. BNP (last 3 results) No results for input(s): PROBNP in the last 8760 hours. HbA1C: No results for input(s): HGBA1C in the last 72 hours. CBG:  Recent Labs Lab 2015-12-20 0731  GLUCAP 101*   Lipid Profile: No results for input(s): CHOL, HDL, LDLCALC, TRIG, CHOLHDL, LDLDIRECT in the last 72 hours. Thyroid Function Tests:  Recent Labs  11/04/2015 2312  TSH 3.140   Anemia Panel: No results for input(s): VITAMINB12, FOLATE, FERRITIN, TIBC, IRON, RETICCTPCT in the last 72 hours. Sepsis Labs:  Recent Labs Lab 11/14/2015 1638 10/25/2015 1649 10/31/2015 1933  LATICACIDVEN 6.76* 6.43* 3.66*    Recent Results (from the past 240 hour(s))  Blood Culture (routine x 2)     Status: None (Preliminary result)   Collection Time: 11/14/2015  4:37 PM  Result Value Ref Range Status   Specimen Description BLOOD PORTA CATH DRAWN BY RN  Final   Special Requests   Final    BOTTLES DRAWN AEROBIC AND ANAEROBIC 6CC DRAWN BY RN   Culture NO GROWTH < 24 HOURS  Final   Report Status PENDING  Incomplete  Blood Culture (routine x 2)     Status: None (Preliminary result)   Collection Time: 11/19/2015  4:42 PM  Result Value Ref Range Status   Specimen Description RIGHT ANTECUBITAL  Final   Special Requests BOTTLES DRAWN AEROBIC AND ANAEROBIC 6CC  Final   Culture NO GROWTH < 24 HOURS  Final   Report Status PENDING  Incomplete  MRSA PCR  Screening     Status: Abnormal   Collection Time: 10/27/2015 10:45 PM  Result Value Ref Range Status   MRSA by PCR POSITIVE (A) NEGATIVE Final    Comment:        The GeneXpert MRSA Assay (FDA approved for NASAL specimens only), is one component of a comprehensive MRSA colonization surveillance program. It is not intended to diagnose MRSA infection nor to guide or monitor treatment for MRSA infections. RESULT CALLED TO, READ BACK BY AND VERIFIED WITH: Miquel Dunn [id number] ON [id number] BY FORSYTH K          Radiology Studies: Dg Chest Portable 1 View  11/13/2015  CLINICAL DATA:  Status post intubation EXAM: PORTABLE CHEST 1 VIEW COMPARISON:  11/10/2015 FINDINGS: Endotracheal tube is seen approximately 5 cm above the carina. Right chest wall port is again seen and stable. Patchy consolidation is again noted within the right lung. The left lung is clear. Postsurgical changes are noted on the left consistent with a prior history. IMPRESSION: Stable right-sided infiltrate. Tubes and lines as described. Electronically Signed   By: Inez Catalina M.D.   On: 11/07/2015 20:55   Dg Chest Port 1 View  11/10/2015  CLINICAL DATA:  Fever.  Hypoxia .  Metastatic tongue base cancer. EXAM: PORTABLE CHEST 1 VIEW COMPARISON:  10/07/2015 chest radiograph. FINDINGS: Right internal jugular MediPort terminates near the cavoatrial junction. Surgical clips overlie the left heart. Stable cardiomediastinal silhouette with normal heart size. No pneumothorax. No pleural effusion. There is new extensive patchy consolidation throughout the entire right lung. Previously described left lung base patchy opacities have largely resolved. IMPRESSION: 1. New extensive patchy consolidation throughout the entire right lung, most concerning for multilobar pneumonia. A component of metastatic disease cannot be excluded. Follow-up chest imaging advised. 2. Previously described patchy left lung base opacities have largely resolved.  Electronically Signed   By: Ilona Sorrel M.D.   On: 11/18/2015 16:45        Scheduled Meds: .  antiseptic oral rinse  7 mL Mouth Rinse 10 times per day  . aztreonam  1 g Intravenous Q8H  . calcium gluconate  1 g Intravenous Once  . chlorhexidine gluconate (SAGE KIT)  15 mL Mouth Rinse BID  . Chlorhexidine Gluconate Cloth  6 each Topical Q0600  . feeding supplement (PRO-STAT SUGAR FREE 64)  30 mL Per Tube BID  . feeding supplement (VITAL HIGH PROTEIN)  1,000 mL Per Tube Q24H  . filgrastim  300 mcg Subcutaneous Daily  . fluconazole (DIFLUCAN) IV  100 mg Intravenous Q24H  . heparin  5,000 Units Subcutaneous Q8H  . hydrocortisone sod succinate (SOLU-CORTEF) inj  50 mg Intravenous Q8H  . levalbuterol  0.63 mg Nebulization Q6H  . [START ON 11/23/2015] levofloxacin (LEVAQUIN) IV  500 mg Intravenous Q48H  . mupirocin ointment  1 application Nasal BID  . pantoprazole (PROTONIX) IV  40 mg Intravenous Daily  . potassium chloride  10 mEq Intravenous Q1 Hr x 4  . sodium chloride flush  3 mL Intravenous Q12H  . [START ON 11/23/2015] vancomycin  500 mg Intravenous Q48H   Continuous Infusions: . sodium chloride    . fentaNYL infusion INTRAVENOUS 400 mcg/hr (12-15-2015 0800)  . midazolam (VERSED) infusion 3 mg/hr (12/15/2015 0800)  . norepinephrine (LEVOPHED) Adult infusion 26 mcg/min (December 15, 2015 0911)     LOS: 1 day    Arion Morgan K, MD [facility] Pager [phone]  If 7PM-7AM, please contact night-coverage [email or web address] Password [facility] 15-Dec-2015, 9:46 AM

## 2015-11-25 NOTE — Progress Notes (Signed)
BRIEF NUTRITION NOTE:  Consult for TF management. Pt being transferred to [facility] ICU. Upon arrival to pt's room, MD said pt would be transferred and there was no need to see pt about tube feeding at this time. Calculated needs: 1798 kcals and 92 grams of protein. Dependent on pressors and MAPs running low. K, Mag low. BMI is underweight.   Geoffery Lyons, [place] [facility] Dietetic Intern Pager [phone]

## 2015-11-25 NOTE — H&P (Signed)
PULMONARY / CRITICAL CARE MEDICINE   Name: Shelby Larson MRN: [id number] DOB: [birth year]    ADMISSION DATE:  11/20/2015 CONSULTATION DATE:  2015/11/25  REFERRING MD:  Dr. 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 Her healthcare associated pneumonia has progressed to septic shock and she has required mechanical ventilatory support. She was transferred to our facility for further management of the same as there were no pulmonary or critical care medicine physicians available at [facility]. No further history could be obtained because the patient was intubated to all history was obtained by chart review.  By my review of the records it seems as if this patient has been treated with chemotherapy and radiation therapy. She has a history of a tracheostomy which has been removed as well as a PEG tube. The most recent records from her oncologist are dated 11/18/2015 where she was noted to have continued weight loss and symptoms of mucositis. Apparently during that conversation they began discussing end-of-life issues and the oncologist noted that they were going to discuss her CODE STATUS on the next visit.  PAST MEDICAL HISTORY :  She  has a past medical history of Heart murmur; Chronic bronchitis ([place]); Anxiety; Depression; Headache; Neuropathy ([place]); Arthritis; Fibromyalgia; Anemia; Rosacea; Hypercholesterolemia; Asthma; COPD (chronic obstructive pulmonary disease) ([place]); Seizures ([place]); Malignant neoplasm of base of tongue ([place]) (2016); Wears glasses; Pneumonia; Hypothyroidism; GERD  (gastroesophageal reflux disease); Chronic renal disease, stage 3, moderately decreased glomerular filtration rate (GFR) between 30-59 mL/min/1.73 square meter (11/11/2015); and Anemia of chronic renal failure, stage 3 (moderate) (11/11/2015).  PAST SURGICAL HISTORY: She  has past surgical history that includes Wisdom tooth extraction; Tonsillectomy (Left, 08/20/2014); Radical neck dissection (Left, 08/20/2014); Tracheostomy tube placement (N/A, 08/20/2014); Gastrostomy tube placement; Multiple extractions with alveoloplasty (N/A, 09/17/2014); Portacath placement; Colonoscopy with propofol (N/A, 04/15/2015); polypectomy (N/A, 04/15/2015); Esophagogastroduodenoscopy (egd) with propofol (N/A, 07/19/2015); Givens capsule study (N/A, 07/19/2015); Tonsillectomy; Video bronchoscopy with endobronchial ultrasound (N/A, 10/07/2015); and Mediastinotomy (Left, 10/07/2015).  Allergies  Allergen Reactions  . Bee Venom Swelling  . Bacitracin Other (See Comments)    unknown  . Penicillins Other (See Comments)    Has patient had a PCN reaction causing immediate rash, facial/tongue/throat swelling, SOB or lightheadedness with hypotension: unknown, childhood reaction Has patient had a PCN reaction causing severe rash involving mucus membranes or skin necrosis: unknown Has patient had a PCN reaction that required hospitalization unknown Has patient had a PCN reaction occurring within the last 10 years: unknown If all of the above answers are "NO", then may proceed with Cephalosporin use.  . Latex Swelling and Rash    Current Facility-Administered Medications on File Prior to Encounter  Medication  . sodium chloride 0.9 % injection 10 mL  . sodium chloride 0.9 % injection 10 mL   Current Outpatient Prescriptions on File Prior to Encounter  Medication Sig  . BIOTIN 5000 PO Take 10,000 mcg by mouth daily.   . cevimeline (EVOXAC) 30 MG capsule Take 1 capsule (30 mg total) by mouth 3 (three) times daily.  .  clotrimazole-betamethasone (LOTRISONE) cream Apply 1 application topically 2 (two)  times daily. Apply to affected area BID  . cyclobenzaprine (FLEXERIL) 10 MG tablet Take 1 tablet (10 mg total) by mouth 3 (three) times daily as needed for muscle spasms.  . Diphenhyd-Hydrocort-Nystatin (FIRST-DUKES MOUTHWASH) SUSP Swish and swallow 4 times a day as needed for mouth pain  . DULoxetine (CYMBALTA) 30 MG capsule Take 2 capsules (60 mg total) by mouth daily.  . fentaNYL (DURAGESIC - DOSED MCG/HR) 100 MCG/HR Apply 2 patches to skin and change every 2 days  . fluticasone (FLONASE) 50 MCG/ACT nasal spray Place 2 sprays into both nostrils daily. (Patient taking differently: Place 2 sprays into both nostrils daily as needed. )  . gabapentin (NEURONTIN) 300 MG capsule Take 2 capsules (600 mg total) by mouth 3 (three) times daily.  Marland Kitchen HYDROcodone-acetaminophen (LORTAB) 7.5-500 MG/15ML solution Take 15 mLs by mouth every 4 (four) hours as needed for pain.  Marland Kitchen HYDROcodone-acetaminophen (NORCO) 10-325 MG tablet Take 1-2 tablets by mouth every 4 (four) hours as needed for moderate pain.  Marland Kitchen levothyroxine (LEVOTHROID) 25 MCG tablet Take 1 tablet (25 mcg total) by mouth daily before breakfast.  . lidocaine-prilocaine (EMLA) cream Apply a quarter size amount to port site 1 hour prior to chemo. Do not rub in. Cover with plastic wrap.  . linaclotide (LINZESS) 145 MCG CAPS capsule Take 1 capsule (145 mcg total) by mouth daily before breakfast. Please ask for procedure for G-tube administration.  Marland Kitchen LORazepam (ATIVAN) 2 MG tablet Take 1 tablet (2 mg total) by mouth every 6 (six) hours as needed for anxiety.  . Nutritional Supplements (FEEDING SUPPLEMENT, JEVITY 1.5 CAL/FIBER,) LIQD 5 cans. Nocturnal feed. Rate100 ml/hr x 12 hours. Provides. 1778 kcals, 76 g pro, 900 mls fluid.  Flush 180 mls 5x a day.  . nystatin (MYCOSTATIN) 100000 UNIT/ML suspension Take 5 mLs (500,000 Units total) by mouth 4 (four) times daily. Take in  addition to Magic Mouthwash.  . oxyCODONE (OXY IR/ROXICODONE) 5 MG immediate release tablet Take 1-2 tablets (5-10 mg total) by mouth every 6 (six) hours as needed for moderate pain or breakthrough pain.  Marland Kitchen prochlorperazine (COMPAZINE) 10 MG tablet Take 1 tablet (10 mg total) by mouth every 6 (six) hours as needed for nausea or vomiting.  Marland Kitchen QUEtiapine (SEROQUEL) 50 MG tablet Take 1 tablet (50 mg total) by mouth daily as needed (sleep).  . sodium fluoride (PREVIDENT 5000 PLUS) 1.1 % CREA dental cream Apply to tooth brush. Brush teeth for 2 minutes. Spit out excess-DO NOT swallow. Repeat nightly. (Patient taking differently: Place 1 application onto teeth at bedtime. Apply to tooth brush. Brush teeth for 2 minutes. Spit out excess-DO NOT swallow. Repeat nightly.)  . tetrahydrozoline-zinc (VISINE-AC) 0.05-0.25 % ophthalmic solution Place 2 drops into both eyes daily as needed (dry eyes).  . triamcinolone cream (KENALOG) 0.1 % Apply 1 application topically 2 (two) times daily. Apply to affected areas.    FAMILY HISTORY/SOCIAL HISTORY/REVIEW OF SYSTEMS:   Cannot obtain intubation  SUBJECTIVE:  As above  VITAL SIGNS: BP 84/62 mmHg  Pulse 121  Temp(Src) 99 F (37.2 C) (Core (Comment))  Resp 31  Ht 5\' 7"  (1.702 m)  Wt 48 kg (105 lb 13.1 oz)  BMI 16.57 kg/m2  SpO2 98%  LMP 07/15/2014  HEMODYNAMICS:    VENTILATOR SETTINGS: Vent Mode:  [-] PRVC FiO2 (%):  [80 %-100 %] 100 % Set Rate:  [24 bmp-28 bmp] 28 bmp Vt Set:  [500 mL] 500 mL PEEP:  [5 cmH20-7 cmH20] 7 cmH20 Plateau Pressure:  [20  cmH20-27 cmH20] 27 cmH20  INTAKE / OUTPUT: I/O last 3 completed shifts: In: 7223.3 [I.V.:4957.6; Blood:1005; NG/GT:260.7; IV Piggyback:1000] Out: 215 [Urine:215]  PHYSICAL EXAMINATION: General:  Cachectic, on vent Neuro:  Heavily sedated on vent HEENT:  NCAT, old trach site noted, ETT in place Cardiovascular:  Tachy, regular, no mgr Lungs:  Rhonchi bilaterally, vent supported breaths Abdomen:   BS+, soft, nontender Musculoskeletal:  MSK normal bulk and tone Skin:  Diaphoretic, flushed  LABS:  BMET  Recent Labs Lab 11/18/15 1130 11/23/2015 1632 11/07/2015 2312 Dec 17, 2015 0445  NA 131* 133*  --  136  K 3.5 3.1*  --  2.7*  CL 91* 95*  --  109  CO2 30 21*  --  18*  BUN 44* 55*  --  53*  CREATININE 1.43* 3.07* 2.61* 2.68*  GLUCOSE 104* 83  --  150*    Electrolytes  Recent Labs Lab 11/18/15 1130 11/01/2015 1632 11/16/2015 2312 17-Dec-2015 0445  CALCIUM 8.6* 7.2*  --  5.9*  MG 1.8  --  1.0* 1.0*  PHOS  --   --  2.4* 2.7    CBC  Recent Labs Lab 11/07/2015 1632 11/01/2015 2312 2015-12-17 0445  WBC 0.9* 0.3* 0.3*  HGB 7.4* 7.8* 8.1*  HCT 21.7* 22.6* 23.7*  PLT 134* 63* 45*    Coag's No results for input(s): APTT, INR in the last 168 hours.  Sepsis Markers  Recent Labs Lab 11/16/2015 1649 10/25/2015 1933 12-17-15 1005  LATICACIDVEN 6.43* 3.66* 2.7*    ABG  Recent Labs Lab 11/07/2015 2050 2015/12/17 0338 2015-12-17 0850  PHART 7.386 7.292* 7.265*  PCO2ART 32.6* 37.9 36.4  PO2ART 141.0* 68.2* 63.0*    Liver Enzymes  Recent Labs Lab 11/18/15 1130 10/26/2015 1632 17-Dec-2015 0445  AST 22 43* 50*  ALT 11* 12* 14  ALKPHOS 145* 38 21*  BILITOT 0.6 0.7 1.0  ALBUMIN 3.7 2.5* 1.8*    Cardiac Enzymes No results for input(s): TROPONINI, PROBNP in the last 168 hours.  Glucose  Recent Labs Lab 12/17/2015 0731 December 17, 2015 1117  GLUCAP 101* 131*    Imaging Dg Chest Portable 1 View  11/05/2015  CLINICAL DATA:  Status post intubation EXAM: PORTABLE CHEST 1 VIEW COMPARISON:  11/19/2015 FINDINGS: Endotracheal tube is seen approximately 5 cm above the carina. Right chest wall port is again seen and stable. Patchy consolidation is again noted within the right lung. The left lung is clear. Postsurgical changes are noted on the left consistent with a prior history. IMPRESSION: Stable right-sided infiltrate. Tubes and lines as described. Electronically Signed   By: Inez Catalina  M.D.   On: 11/11/2015 20:55   Dg Chest Port 1 View  11/21/2015  CLINICAL DATA:  Fever.  Hypoxia .  Metastatic tongue base cancer. EXAM: PORTABLE CHEST 1 VIEW COMPARISON:  10/07/2015 chest radiograph. FINDINGS: Right internal jugular MediPort terminates near the cavoatrial junction. Surgical clips overlie the left heart. Stable cardiomediastinal silhouette with normal heart size. No pneumothorax. No pleural effusion. There is new extensive patchy consolidation throughout the entire right lung. Previously described left lung base patchy opacities have largely resolved. IMPRESSION: 1. New extensive patchy consolidation throughout the entire right lung, most concerning for multilobar pneumonia. A component of metastatic disease cannot be excluded. Follow-up chest imaging advised. 2. Previously described patchy left lung base opacities have largely resolved. Electronically Signed   By: Ilona Sorrel M.D.   On: 11/21/2015 16:45     STUDIES:  2017 PET/CT images personally reviewed showing a large mass  anterior to the heart and the left mediastinum as well as multiple left-sided pulmonary masses and mediastinal lymphadenopathy, there is one hilar FDG avid node in the right Chest x-ray images from this hospitalization personally reviewed showing significant progression of severe airspace disease in the left lung  CULTURES: May 28 blood culture  ANTIBIOTICS: May 28 vancomycin May 28 Levaquin May 28 aztreonam  SIGNIFICANT EVENTS: 11/06/2015 admitted to [facility] 12/22/15 transferred to [facility] long hospital  LINES/TUBES: 11/13/2015 endotracheal tube  DISCUSSION: This is a 51 year old female with a history of metastatic head and neck cancer status post chemotherapy and radiation therapy with ongoing weight loss and significant symptoms related to her malignancy who was admitted on 11/13/2015 to her local hospital in the setting of septic shock from healthcare associated pneumonia. This is  lead to acute respiratory failure with hypoxemia requiring mechanical ventilation and multiorgan failure with acute kidney injury. Is very unlikely she will survive this illness from an acute standpoint as she has neutropenia, worsening hemodynamics, and worsening chest x-ray findings. Further, the fact that she has stage IV malignancy pretends a very poor prognosis as we know that patients with severe critical illness with stage IV cancer nearly as a rule do not survive 6 months after their ICU admission.  ASSESSMENT / PLAN:  PULMONARY A: Acute respiratory failure with hypoxemia Healthcare associated pneumonia Lung cancer, head and neck P:   ABG now Chest x-ray daily Continue full ventilatory support Assessment daily  CARDIOVASCULAR A:  Septic shock P:  Continue norepinephrine through port Place peripheral IV Telemetry monitoring Gentle IV fluids We'll discuss goals of care with family care limitation  RENAL A:   Acute kidney injury, oliguric P:   Replace potassium Monitor BMET and UOP Replace electrolytes as needed  Given her overall poor prognosis there is no role for hemodialysis  GASTROINTESTINAL A:   No acute issues P:   Place OG tube Famotidine for stress ulcer prophylaxis  HEMATOLOGIC A:   Neutropenia secondary to chemotherapy Thrombocytopenia secondary to chemotherapy Anemia secondary to chemotherapy, no bleeding P:  Monitor for bleeding Transfuse PRBC for hemoglobin less than 7 Transfuse platelets if active bleeding or procedures needed  INFECTIOUS A:   Healthcare associated pneumonia Septic shock P:   Add respiratory culture Follow cultures collected at 80 and Continue current antibiotic regimen  ENDOCRINE A:   Hyperglycemia Stress dose steroids being administered   P:   Continue hydrocortisone Target glucose is 140-180 per NICE SUGAR trial  NEUROLOGIC A:   Sedation for ventilator synchrony P:   RASS goal: -2 PAD  protocol   FAMILY  - Updates: I updated the patient's family at length today.  I explained to her mother Bonnita Nasuti and her friend who has been involved with her care that she will not survive this illness. Specifically I addressed the fact that we are starting with an incurable cancer and she now has an additional acute illness (multi-organ failure from septic shock from healthcare associated pneumonia) which carries about a 50% mortality in healthy young adults.  Given her advanced cancer and failure to thrive recently (note Dr. Donald Pore 5/25 office note) she will not survive this illness.  Further, advancing her level of care is not appropriate.    So we have decided to not escalate care.  We will not do further invasive procedure and we will not perform CPR or shocks as this would only cause more harm than good.  We have decided to focus on her comfort more  than anything else and I have recommended that they withdraw care after more family arrives.  CODE STATUS: DNR  - Inter-disciplinary family meet or Palliative Care meeting due by:  Day 7  My cc time 60 minutes  Roselie Awkward, MD [facility] Pager: [phone] Cell: [phone] After 3pm or if no response, call [phone]   30-Nov-2015, 1:01 PM

## 2015-11-25 NOTE — Progress Notes (Signed)
Pt taken to [facility] via medical transport. Report given to [facility] in the icu. Family at bedside-all questions concerns answered. Belongings taken with patient and given to family.

## 2015-11-25 DEATH — deceased

## 2015-11-26 ENCOUNTER — Inpatient Hospital Stay (HOSPITAL_COMMUNITY): Payer: Self-pay

## 2015-11-26 ENCOUNTER — Telehealth: Payer: Self-pay

## 2015-11-26 LAB — CULTURE, RESPIRATORY W GRAM STAIN: Special Requests: NORMAL

## 2015-11-26 NOTE — Telephone Encounter (Signed)
On 11/26/2015 I received a death certificate from [facility] (original). The death certificate is for cremation. The patient is a patient of Doctor McQuaid. The death certificate will be taken to [facility] this pm for signature. On 2015/12/23 I received the death certificate back from Doctor McQuaid. I got the death certificate ready and called the funeral home to let them know the death certificate was mailed to the [facility] Dept per their request.

## 2015-11-27 LAB — CULTURE, BLOOD (ROUTINE X 2): CULTURE: NO GROWTH

## 2015-11-30 ENCOUNTER — Other Ambulatory Visit (HOSPITAL_COMMUNITY): Payer: Self-pay

## 2015-11-30 ENCOUNTER — Ambulatory Visit (HOSPITAL_COMMUNITY): Payer: Self-pay

## 2015-12-02 ENCOUNTER — Ambulatory Visit (HOSPITAL_COMMUNITY): Payer: Self-pay | Admitting: Hematology & Oncology

## 2015-12-02 ENCOUNTER — Inpatient Hospital Stay (HOSPITAL_COMMUNITY): Payer: Self-pay

## 2015-12-03 ENCOUNTER — Inpatient Hospital Stay (HOSPITAL_COMMUNITY): Payer: Self-pay

## 2015-12-03 ENCOUNTER — Ambulatory Visit (HOSPITAL_COMMUNITY): Payer: Self-pay

## 2015-12-03 ENCOUNTER — Ambulatory Visit (HOSPITAL_COMMUNITY): Payer: Self-pay | Admitting: Hematology & Oncology

## 2015-12-06 ENCOUNTER — Encounter (HOSPITAL_COMMUNITY): Payer: Self-pay

## 2015-12-07 ENCOUNTER — Ambulatory Visit (HOSPITAL_COMMUNITY): Payer: Self-pay

## 2015-12-07 ENCOUNTER — Encounter (HOSPITAL_COMMUNITY): Payer: Self-pay

## 2015-12-09 ENCOUNTER — Inpatient Hospital Stay (HOSPITAL_COMMUNITY): Payer: Self-pay

## 2015-12-10 ENCOUNTER — Inpatient Hospital Stay (HOSPITAL_COMMUNITY): Payer: Self-pay

## 2015-12-16 ENCOUNTER — Inpatient Hospital Stay (HOSPITAL_COMMUNITY): Payer: Self-pay

## 2015-12-17 ENCOUNTER — Ambulatory Visit (HOSPITAL_COMMUNITY): Payer: Self-pay

## 2015-12-17 ENCOUNTER — Inpatient Hospital Stay (HOSPITAL_COMMUNITY): Payer: Self-pay

## 2015-12-23 ENCOUNTER — Inpatient Hospital Stay (HOSPITAL_COMMUNITY): Payer: Self-pay

## 2015-12-25 NOTE — Discharge Summary (Signed)
[facility] pulmonary and critical care medicine death note  Date of death 2015/11/23  Cause of death #1 healthcare associated pneumonia MRSA pneumonia Squamous cell carcinoma of the tongue, stage IV  Brief hospital course: She was admitted to [facility] for severe shortness of breath. She had a past medical history significant for stage IV squamous cell carcinoma of the tongue and had recently been experiencing weight loss, failure to thrive, and mucositis. In the [facility] hospital she was intubated for respiratory failure and was treated with broad-spectrum antibiotics, IV fluids, and with vasopressors. Pulmonary and critical care medicine was consulted and she was accepted in transfer to [facility]. She was transferred to [facility]. Upon arrival we had a conversation with the patient's family about her exceedingly high mortality. At the time of arrival to [facility] long hospital she had multiorgan failure, worsening shock, worsening renal failure with oliguria, and worsening hypoxemia. We explained that the likelihood of survival was exceedingly low. The patient's family voiced understanding and we changed CODE STATUS to DO NOT RESUSCITATE and did not escalate care. The patient died peacefully with her family at the bedside.  Roselie Awkward, MD [facility] [phone]

## 2016-02-29 ENCOUNTER — Ambulatory Visit: Payer: Self-pay | Admitting: Nurse Practitioner

## 2016-12-10 IMAGING — PT NM PET TUM IMG INITIAL (PI) SKULL BASE T - THIGH
7 series · 25 of 25 positions shown · non-contrast
Comparison: Neck CT 08/08/2014.

ADDENDUM:
The following is a correction to the body of the report:

CHEST
Hypermetabolic left axillary lymph node measures 1.1 cm and has an
SUV max equal to 6.27. No
hypermetabolic mediastinal or hilar lymph nodes identified. There is
a sub solid nodule identified within the right upper lobe measuring
1.6 cm, image 17/series 6. The SUV max associated this nodule is
equal to 2.1.
CLINICAL DATA: Initial treatment strategy for base of tongue
cancer.
EXAM:
NUCLEAR MEDICINE PET SKULL BASE TO THIGH
TECHNIQUE: 6.5 mCi F-18 FDG was injected intravenously. Full-ring PET imaging
was performed from the skull base to thigh after the radiotracer. CT
data was obtained and used for attenuation correction and anatomic
localization.
FASTING BLOOD GLUCOSE:  Value: 79 mg/dl

[Series 3: pet hn_sk_thigh ac · axial · 5.0mm · 4.07mm/px · z∈[-1402,-622]mm · 5 of 196 slices shown]
[im 1/196]
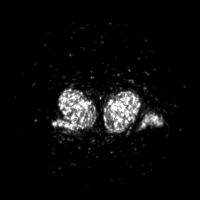
[im 49/196]
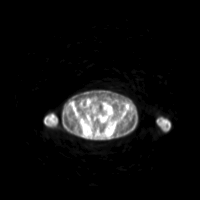
[im 98/196]
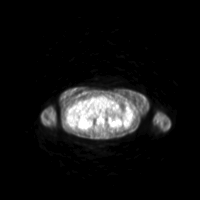
[im 147/196]
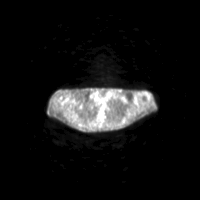
[im 196/196]
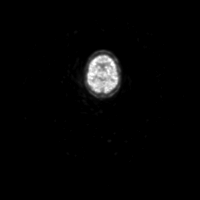

[Series 4: ct hn_sk_th 5.0 b31f · axial · 5.0mm · 0.98mm/px · z∈[-1402,-622]mm · 5 of 196 slices shown]
[im 1/196]
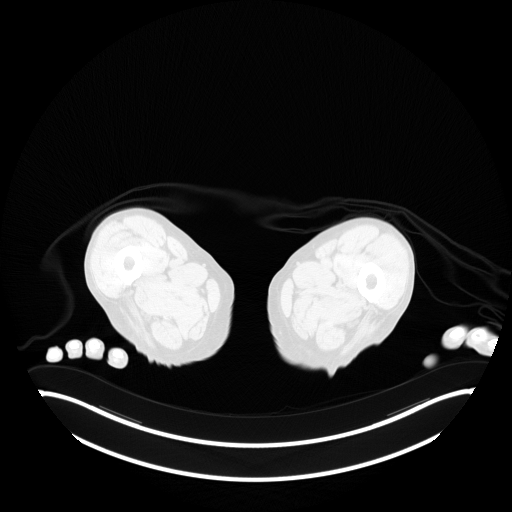
[im 49/196]
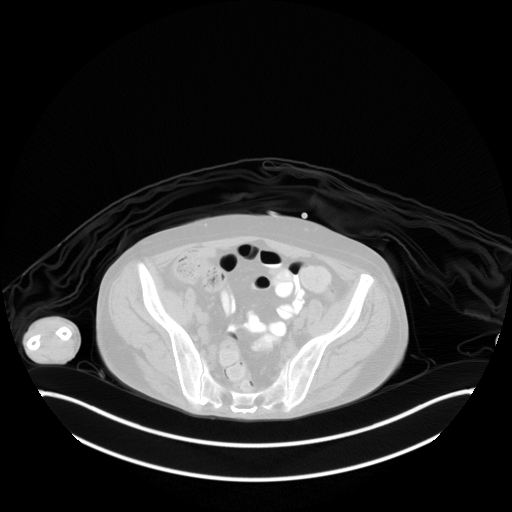
[im 98/196]
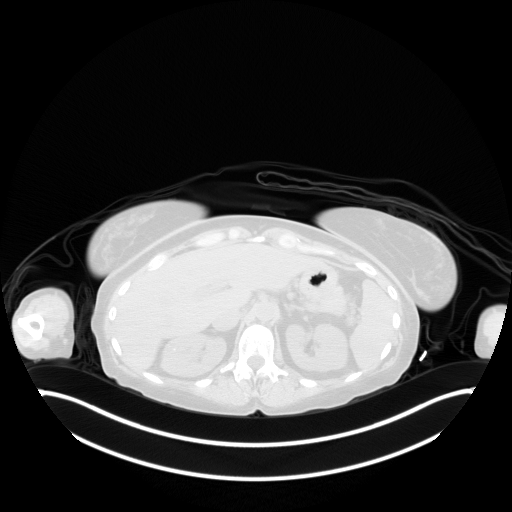
[im 147/196]
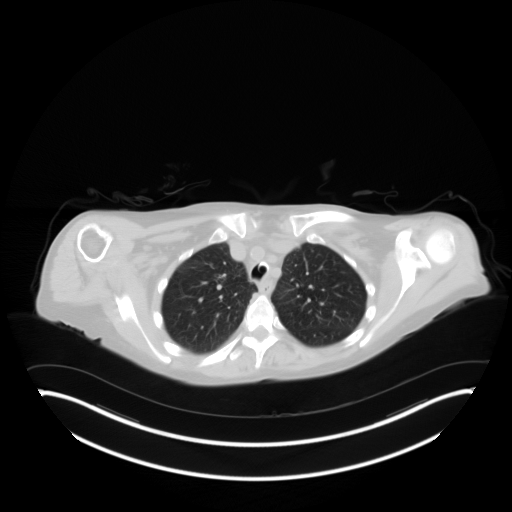
[im 196/196  brain]
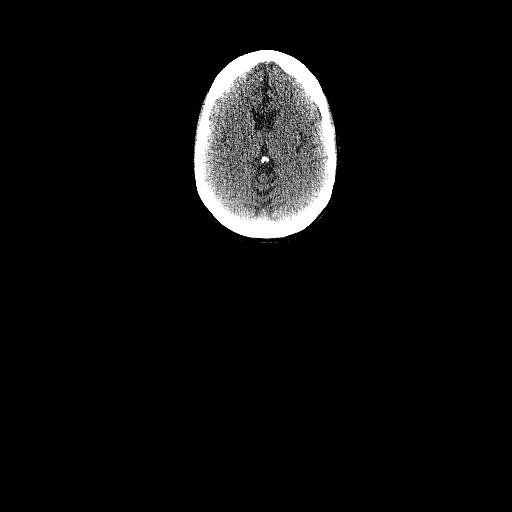

[Series 6: ct hn_sk_th 5.0 b70f lung_bone · axial · 5.0mm · 0.64mm/px · z∈[-976,-748]mm · 2 of 58 slices shown]
[im 1/58  bone]
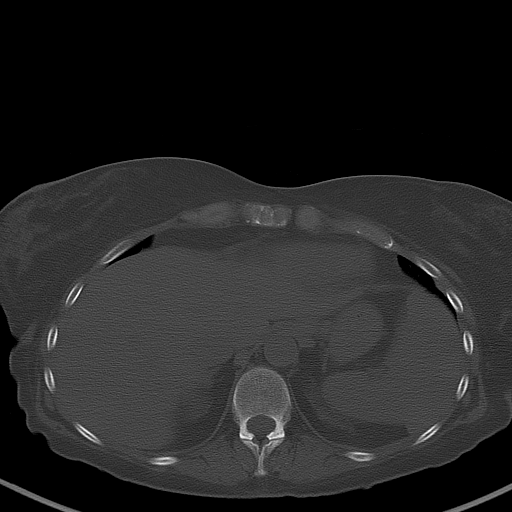
[im 58/58  bone]
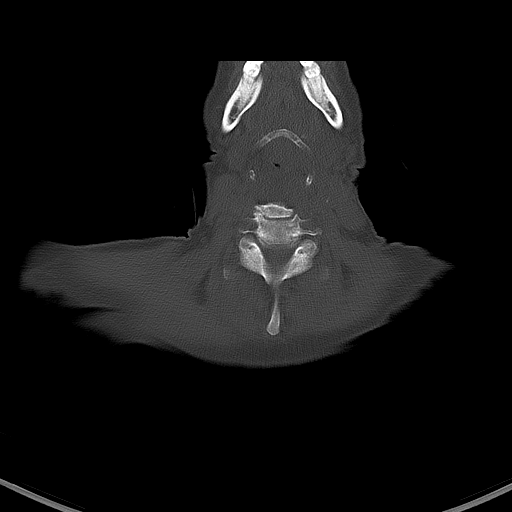

[Series 8: pet hn_sk_thigh nac · axial · 5.0mm · 4.07mm/px · z∈[-1402,-622]mm · 5 of 196 slices shown]
[im 1/196]
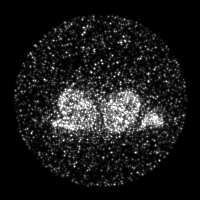
[im 49/196]
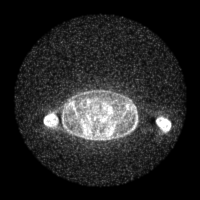
[im 98/196]
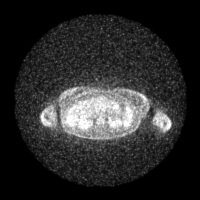
[im 147/196]
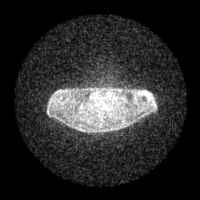
[im 196/196]
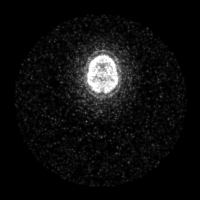

[Series 604: mip collection<mip range> · coronal · 1.68mm/px · 1 of 32 slices shown]
[im 1/32]
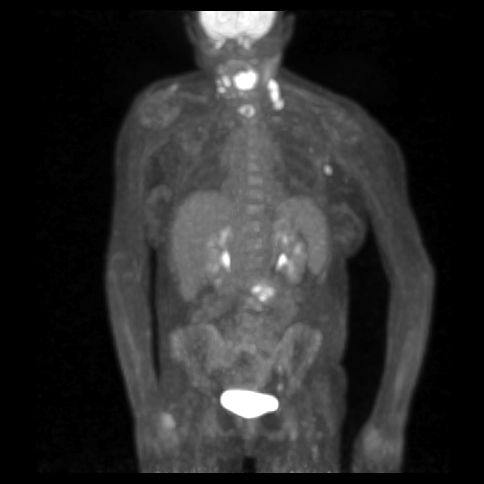

[Series 605: range-ct hn_sk_th 5.0 (id)<alpha range> · 2 of 81 slices shown (1 of 2)]
[im 1/81]
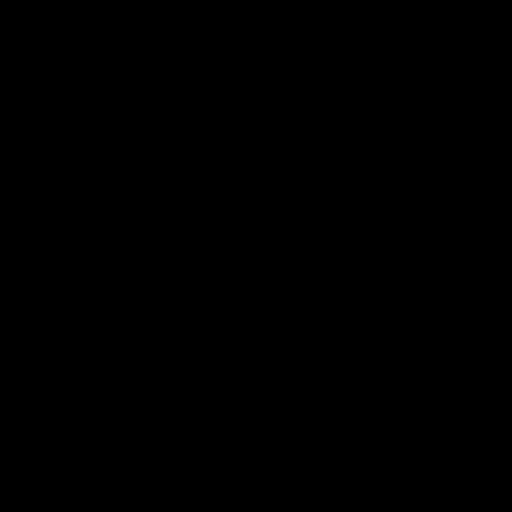
[im 81/81]
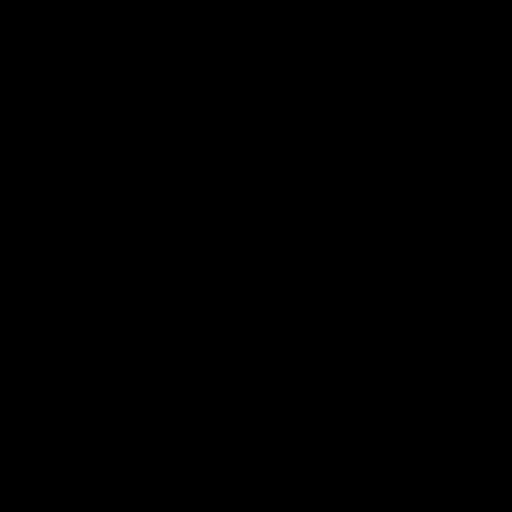

[Series 606: range-ct hn_sk_th 5.0 (id)<alpha range> · 5 of 180 slices shown (2 of 2)]
[im 1/180]
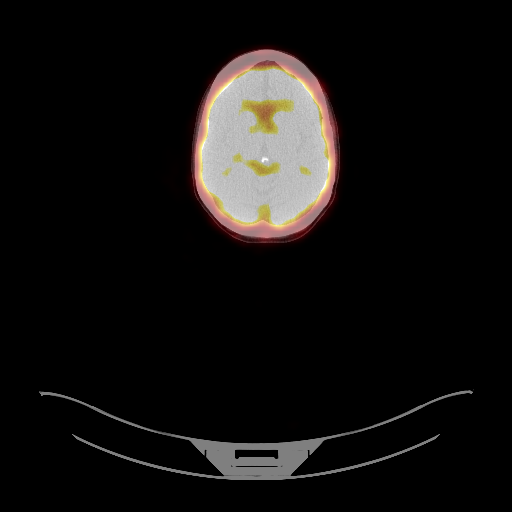
[im 45/180]
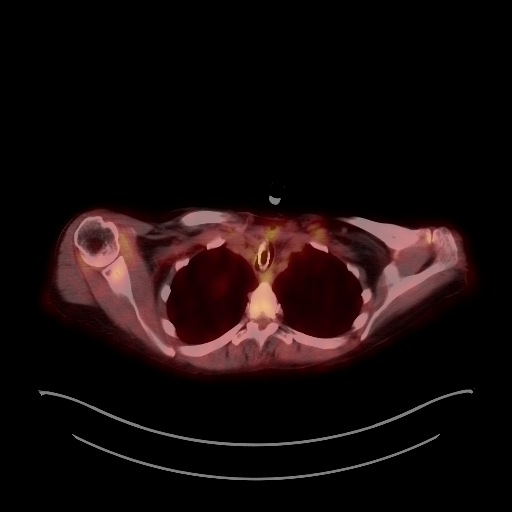
[im 90/180]
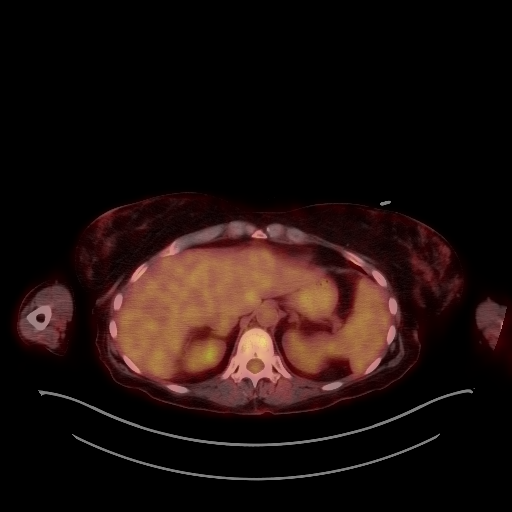
[im 135/180]
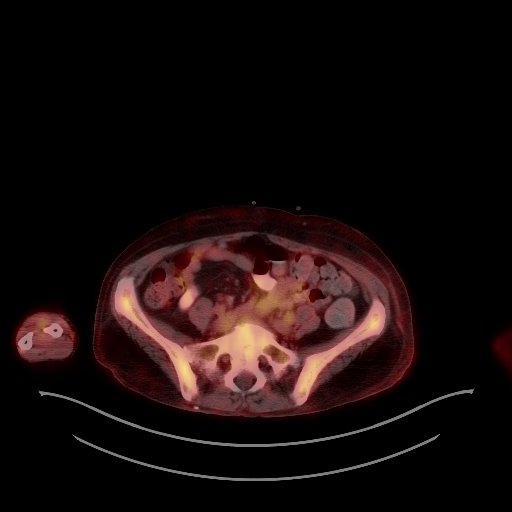
[im 180/180]
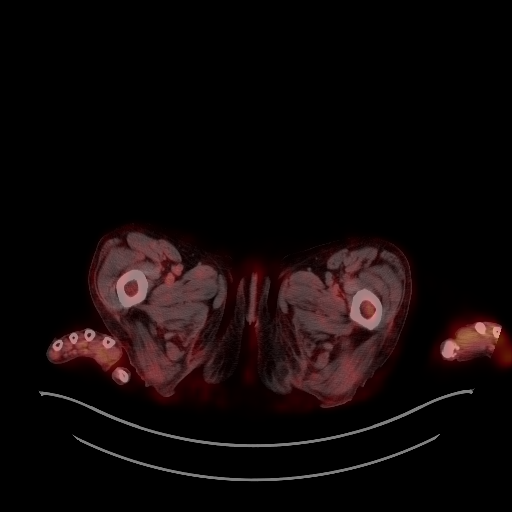

[25 of 25 positions shown; findings below may reference images not displayed]

FINDINGS: NECK

Large circumferential oropharyngeal mass is again identified this
measures approximately 3.9 x 3.5 x 3.2 cm and has an SUV max equal
to 18.98. Hypermetabolic bilateral cervical lymph nodes are
identified; index right level-II lymph node measures 1.3 cm and has
an SUV max equal to 9.2. Left supraclavicular lymph node measures
1.2 cm and has an SUV max equal to 14.7. Small right supraclavicular
lymph node measures less than 1 cm and exhibits mild FDG uptake
within SUV max equal to 2.4.Tracheostomy tube is identified.

CHEST

Hypermetabolic left axillary lymph node measures 6.3 cm. No
hypermetabolic mediastinal or hilar lymph nodes identified. There is
a sub solid nodule identified within the right upper lobe measuring
1.6 cm, image 17/series 6. The SUV max associated this nodules
occult a 2.1.

ABDOMEN/PELVIS

Gastrostomy tube noted. No abnormal hypermetabolic activity within
the liver, pancreas, adrenal glands, or spleen. No hypermetabolic
lymph nodes in the abdomen or pelvis.

SKELETON

No focal hypermetabolic activity to suggest skeletal metastasis.
IMPRESSION: 1. Intensely hypermetabolic circumferential oropharyngeal mass is
identified.
2. Hypermetabolic bilateral cervical metastatic adenopathy as well
as hypermetabolic left axillary adenopathy.
3. Sub solid nodule identified within the right upper lobe is
nonspecific but exhibits malignant range FDG uptake. Attention of
this nodule on follow-up examination is recommended.
4. Status post tracheostomy and gastrostomy tube placement
